# Patient Record
Sex: Male | Born: 1950 | Race: Black or African American | Hispanic: No | Smoking: Former smoker
Health system: Southern US, Community
[De-identification: ages and names within clinical notes are randomized; demographics above are authoritative.]

## PROBLEM LIST (undated history)

## (undated) DIAGNOSIS — IMO0002 Reserved for concepts with insufficient information to code with codable children: Secondary | ICD-10-CM

## (undated) DIAGNOSIS — F1020 Alcohol dependence, uncomplicated: Secondary | ICD-10-CM

## (undated) DIAGNOSIS — K56609 Unspecified intestinal obstruction, unspecified as to partial versus complete obstruction: Secondary | ICD-10-CM

## (undated) DIAGNOSIS — K219 Gastro-esophageal reflux disease without esophagitis: Secondary | ICD-10-CM

## (undated) DIAGNOSIS — I10 Essential (primary) hypertension: Secondary | ICD-10-CM

## (undated) DIAGNOSIS — I5022 Chronic systolic (congestive) heart failure: Secondary | ICD-10-CM

## (undated) DIAGNOSIS — R229 Localized swelling, mass and lump, unspecified: Secondary | ICD-10-CM

## (undated) HISTORY — DX: Gastro-esophageal reflux disease without esophagitis: K21.9

## (undated) HISTORY — DX: Chronic systolic (congestive) heart failure: I50.22

## (undated) HISTORY — DX: Unspecified intestinal obstruction, unspecified as to partial versus complete obstruction: K56.609

## (undated) HISTORY — DX: Reserved for concepts with insufficient information to code with codable children: IMO0002

## (undated) HISTORY — DX: Essential (primary) hypertension: I10

## (undated) HISTORY — DX: Localized swelling, mass and lump, unspecified: R22.9

## (undated) HISTORY — DX: Alcohol dependence, uncomplicated: F10.20

## (undated) HISTORY — PX: KNEE SURGERY: SHX244

---

## 1998-02-10 ENCOUNTER — Emergency Department (HOSPITAL_COMMUNITY): Admission: EM | Admit: 1998-02-10 | Discharge: 1998-02-10 | Payer: Self-pay | Admitting: Emergency Medicine

## 1998-04-19 ENCOUNTER — Emergency Department (HOSPITAL_COMMUNITY): Admission: EM | Admit: 1998-04-19 | Discharge: 1998-04-19 | Payer: Self-pay | Admitting: Emergency Medicine

## 1998-05-09 ENCOUNTER — Inpatient Hospital Stay (HOSPITAL_COMMUNITY): Admission: EM | Admit: 1998-05-09 | Discharge: 1998-05-12 | Payer: Self-pay | Admitting: Emergency Medicine

## 1999-01-17 ENCOUNTER — Emergency Department (HOSPITAL_COMMUNITY): Admission: EM | Admit: 1999-01-17 | Discharge: 1999-01-17 | Payer: Self-pay | Admitting: Internal Medicine

## 1999-05-25 ENCOUNTER — Emergency Department (HOSPITAL_COMMUNITY): Admission: EM | Admit: 1999-05-25 | Discharge: 1999-05-25 | Payer: Self-pay | Admitting: Emergency Medicine

## 1999-06-02 ENCOUNTER — Encounter: Payer: Self-pay | Admitting: Family Medicine

## 1999-06-02 ENCOUNTER — Ambulatory Visit (HOSPITAL_COMMUNITY): Admission: RE | Admit: 1999-06-02 | Discharge: 1999-06-02 | Payer: Self-pay | Admitting: Family Medicine

## 1999-11-17 ENCOUNTER — Encounter: Payer: Self-pay | Admitting: Orthopedic Surgery

## 1999-11-21 ENCOUNTER — Ambulatory Visit (HOSPITAL_COMMUNITY): Admission: RE | Admit: 1999-11-21 | Discharge: 1999-11-21 | Payer: Self-pay | Admitting: Orthopedic Surgery

## 1999-12-11 ENCOUNTER — Encounter: Admission: RE | Admit: 1999-12-11 | Discharge: 2000-01-22 | Payer: Self-pay | Admitting: Orthopedic Surgery

## 2000-10-01 HISTORY — PX: OTHER SURGICAL HISTORY: SHX169

## 2001-03-18 ENCOUNTER — Emergency Department (HOSPITAL_COMMUNITY): Admission: EM | Admit: 2001-03-18 | Discharge: 2001-03-18 | Payer: Self-pay | Admitting: Emergency Medicine

## 2001-07-07 ENCOUNTER — Emergency Department (HOSPITAL_COMMUNITY): Admission: EM | Admit: 2001-07-07 | Discharge: 2001-07-07 | Payer: Self-pay | Admitting: Emergency Medicine

## 2001-07-16 ENCOUNTER — Encounter: Admission: RE | Admit: 2001-07-16 | Discharge: 2001-07-16 | Payer: Self-pay

## 2001-10-31 ENCOUNTER — Encounter: Admission: RE | Admit: 2001-10-31 | Discharge: 2001-11-25 | Payer: Self-pay | Admitting: Orthopedic Surgery

## 2002-01-16 ENCOUNTER — Emergency Department (HOSPITAL_COMMUNITY): Admission: EM | Admit: 2002-01-16 | Discharge: 2002-01-16 | Payer: Self-pay | Admitting: *Deleted

## 2002-08-31 ENCOUNTER — Emergency Department (HOSPITAL_COMMUNITY): Admission: EM | Admit: 2002-08-31 | Discharge: 2002-08-31 | Payer: Self-pay | Admitting: Podiatry

## 2004-06-23 ENCOUNTER — Encounter: Admission: RE | Admit: 2004-06-23 | Discharge: 2004-06-23 | Payer: Self-pay | Admitting: Occupational Medicine

## 2005-10-01 DIAGNOSIS — F1020 Alcohol dependence, uncomplicated: Secondary | ICD-10-CM

## 2005-10-01 HISTORY — DX: Alcohol dependence, uncomplicated: F10.20

## 2006-05-24 ENCOUNTER — Ambulatory Visit: Payer: Self-pay | Admitting: Family Medicine

## 2006-05-31 ENCOUNTER — Ambulatory Visit: Payer: Self-pay

## 2006-06-12 ENCOUNTER — Ambulatory Visit: Payer: Self-pay | Admitting: Cardiovascular Disease

## 2006-06-12 ENCOUNTER — Encounter: Payer: Self-pay | Admitting: Cardiovascular Disease

## 2006-06-12 ENCOUNTER — Ambulatory Visit (HOSPITAL_COMMUNITY): Admission: RE | Admit: 2006-06-12 | Discharge: 2006-06-12 | Payer: Self-pay | Admitting: Family Medicine

## 2006-11-28 DIAGNOSIS — R079 Chest pain, unspecified: Secondary | ICD-10-CM | POA: Insufficient documentation

## 2006-11-28 DIAGNOSIS — F1010 Alcohol abuse, uncomplicated: Secondary | ICD-10-CM | POA: Insufficient documentation

## 2006-11-28 DIAGNOSIS — R42 Dizziness and giddiness: Secondary | ICD-10-CM | POA: Insufficient documentation

## 2006-11-28 DIAGNOSIS — K625 Hemorrhage of anus and rectum: Secondary | ICD-10-CM | POA: Insufficient documentation

## 2006-11-28 DIAGNOSIS — I10 Essential (primary) hypertension: Secondary | ICD-10-CM | POA: Insufficient documentation

## 2007-05-28 ENCOUNTER — Telehealth (INDEPENDENT_AMBULATORY_CARE_PROVIDER_SITE_OTHER): Payer: Self-pay | Admitting: Family Medicine

## 2007-07-17 ENCOUNTER — Emergency Department (HOSPITAL_COMMUNITY): Admission: EM | Admit: 2007-07-17 | Discharge: 2007-07-17 | Payer: Self-pay | Admitting: Emergency Medicine

## 2007-12-06 ENCOUNTER — Emergency Department (HOSPITAL_COMMUNITY): Admission: EM | Admit: 2007-12-06 | Discharge: 2007-12-07 | Payer: Self-pay | Admitting: Emergency Medicine

## 2008-01-02 ENCOUNTER — Ambulatory Visit (HOSPITAL_COMMUNITY): Admission: RE | Admit: 2008-01-02 | Discharge: 2008-01-02 | Payer: Self-pay | Admitting: Family Medicine

## 2008-01-02 ENCOUNTER — Ambulatory Visit: Payer: Self-pay | Admitting: Family Medicine

## 2008-01-02 DIAGNOSIS — F17200 Nicotine dependence, unspecified, uncomplicated: Secondary | ICD-10-CM | POA: Insufficient documentation

## 2008-01-06 ENCOUNTER — Encounter (INDEPENDENT_AMBULATORY_CARE_PROVIDER_SITE_OTHER): Payer: Self-pay | Admitting: Family Medicine

## 2008-01-08 ENCOUNTER — Encounter (INDEPENDENT_AMBULATORY_CARE_PROVIDER_SITE_OTHER): Payer: Self-pay | Admitting: Family Medicine

## 2008-01-08 ENCOUNTER — Ambulatory Visit: Payer: Self-pay | Admitting: Family Medicine

## 2008-01-12 LAB — CONVERTED CEMR LAB
BUN: 23 mg/dL (ref 6–23)
CO2: 26 meq/L (ref 19–32)
Calcium: 9.2 mg/dL (ref 8.4–10.5)
Chloride: 103 meq/L (ref 96–112)
Cholesterol: 180 mg/dL (ref 0–200)
Creatinine, Ser: 1.09 mg/dL (ref 0.40–1.50)
HDL: 78 mg/dL (ref >39)
Total CHOL/HDL Ratio: 2.3
Triglycerides: 36 mg/dL (ref <150)

## 2008-01-23 ENCOUNTER — Ambulatory Visit: Payer: Self-pay | Admitting: Family Medicine

## 2008-02-27 ENCOUNTER — Encounter (INDEPENDENT_AMBULATORY_CARE_PROVIDER_SITE_OTHER): Payer: Self-pay | Admitting: Family Medicine

## 2008-03-18 ENCOUNTER — Ambulatory Visit: Payer: Self-pay | Admitting: Family Medicine

## 2008-03-18 ENCOUNTER — Encounter (INDEPENDENT_AMBULATORY_CARE_PROVIDER_SITE_OTHER): Payer: Self-pay | Admitting: Family Medicine

## 2008-03-18 DIAGNOSIS — B350 Tinea barbae and tinea capitis: Secondary | ICD-10-CM | POA: Insufficient documentation

## 2008-04-08 ENCOUNTER — Telehealth: Payer: Self-pay | Admitting: *Deleted

## 2008-04-09 ENCOUNTER — Ambulatory Visit: Payer: Self-pay | Admitting: Family Medicine

## 2009-03-02 ENCOUNTER — Emergency Department (HOSPITAL_COMMUNITY): Admission: EM | Admit: 2009-03-02 | Discharge: 2009-03-02 | Payer: Self-pay | Admitting: Emergency Medicine

## 2010-03-07 ENCOUNTER — Emergency Department (HOSPITAL_COMMUNITY): Admission: EM | Admit: 2010-03-07 | Discharge: 2010-03-07 | Payer: Self-pay | Admitting: Emergency Medicine

## 2010-09-05 ENCOUNTER — Encounter: Payer: Self-pay | Admitting: Family Medicine

## 2010-11-02 NOTE — Miscellaneous (Signed)
  Clinical Lists Changes  Problems: Removed problem of SPECIAL SCREENING FOR MALIGNANT NEOPLASMS COLON (ICD-V76.51) Removed problem of SEBORRHEIC DERMATITIS (ICD-690.10) Removed problem of GASTROENTERITIS WITHOUT DEHYDRATION (ICD-558.9)

## 2010-11-07 ENCOUNTER — Encounter: Payer: Self-pay | Admitting: *Deleted

## 2010-12-29 ENCOUNTER — Inpatient Hospital Stay (HOSPITAL_COMMUNITY)
Admission: EM | Admit: 2010-12-29 | Discharge: 2011-01-06 | DRG: 571 | Disposition: A | Discharge status: Home / Self Care | Payer: Self-pay | Attending: Internal Medicine | Admitting: Internal Medicine

## 2010-12-29 ENCOUNTER — Inpatient Hospital Stay (INDEPENDENT_AMBULATORY_CARE_PROVIDER_SITE_OTHER)
Admission: RE | Admit: 2010-12-29 | Discharge: 2010-12-29 | Disposition: A | Discharge status: Home / Self Care | Payer: Self-pay | Source: Ambulatory Visit | Attending: Family Medicine | Admitting: Family Medicine

## 2010-12-29 ENCOUNTER — Emergency Department (HOSPITAL_COMMUNITY): Payer: Self-pay

## 2010-12-29 DIAGNOSIS — K560 Paralytic ileus: Secondary | ICD-10-CM | POA: Diagnosis not present

## 2010-12-29 DIAGNOSIS — K5289 Other specified noninfective gastroenteritis and colitis: Secondary | ICD-10-CM | POA: Diagnosis not present

## 2010-12-29 DIAGNOSIS — A4902 Methicillin resistant Staphylococcus aureus infection, unspecified site: Secondary | ICD-10-CM | POA: Diagnosis present

## 2010-12-29 DIAGNOSIS — L02419 Cutaneous abscess of limb, unspecified: Principal | ICD-10-CM | POA: Diagnosis present

## 2010-12-29 DIAGNOSIS — K449 Diaphragmatic hernia without obstruction or gangrene: Secondary | ICD-10-CM | POA: Diagnosis present

## 2010-12-29 DIAGNOSIS — F17200 Nicotine dependence, unspecified, uncomplicated: Secondary | ICD-10-CM | POA: Diagnosis present

## 2010-12-29 DIAGNOSIS — K219 Gastro-esophageal reflux disease without esophagitis: Secondary | ICD-10-CM | POA: Diagnosis present

## 2010-12-29 DIAGNOSIS — I10 Essential (primary) hypertension: Secondary | ICD-10-CM | POA: Diagnosis present

## 2010-12-29 DIAGNOSIS — E871 Hypo-osmolality and hyponatremia: Secondary | ICD-10-CM | POA: Diagnosis present

## 2010-12-29 LAB — COMPREHENSIVE METABOLIC PANEL
AST: 28 U/L (ref 0–37)
Alkaline Phosphatase: 41 U/L (ref 39–117)
CO2: 27 mEq/L (ref 19–32)
Chloride: 98 mEq/L (ref 96–112)
Creatinine, Ser: 1 mg/dL (ref 0.4–1.5)
GFR calc Af Amer: >60 mL/min (ref >60)
GFR calc non Af Amer: >60 mL/min (ref >60)
Potassium: 4 mEq/L (ref 3.5–5.1)
Total Bilirubin: 0.6 mg/dL (ref 0.3–1.2)

## 2010-12-29 LAB — DIFFERENTIAL
Basophils Relative: 0 % (ref 0–1)
Lymphs Abs: 0.9 10*3/uL (ref 0.7–4.0)
Monocytes Relative: 10 % (ref 3–12)
Neutro Abs: 8.9 10*3/uL — ABNORMAL HIGH (ref 1.7–7.7)
Neutrophils Relative %: 81 % — ABNORMAL HIGH (ref 43–77)

## 2010-12-29 LAB — CBC
Hemoglobin: 14 g/dL (ref 13.0–17.0)
MCH: 33.7 pg (ref 26.0–34.0)
MCV: 96.6 fL (ref 78.0–100.0)
RBC: 4.15 MIL/uL — ABNORMAL LOW (ref 4.22–5.81)

## 2010-12-30 ENCOUNTER — Inpatient Hospital Stay (HOSPITAL_COMMUNITY): Payer: Self-pay

## 2010-12-30 LAB — COMPREHENSIVE METABOLIC PANEL
ALT: 19 U/L (ref 0–53)
Alkaline Phosphatase: 37 U/L — ABNORMAL LOW (ref 39–117)
BUN: 15 mg/dL (ref 6–23)
CO2: 27 mEq/L (ref 19–32)
Chloride: 102 mEq/L (ref 96–112)
GFR calc non Af Amer: >60 mL/min (ref >60)
Glucose, Bld: 133 mg/dL — ABNORMAL HIGH (ref 70–99)
Potassium: 3.8 mEq/L (ref 3.5–5.1)
Sodium: 135 mEq/L (ref 135–145)
Total Bilirubin: 0.8 mg/dL (ref 0.3–1.2)

## 2010-12-30 LAB — CBC
HCT: 35.2 % — ABNORMAL LOW (ref 39.0–52.0)
Hemoglobin: 12 g/dL — ABNORMAL LOW (ref 13.0–17.0)
MCV: 96.2 fL (ref 78.0–100.0)
RBC: 3.66 MIL/uL — ABNORMAL LOW (ref 4.22–5.81)
RDW: 12.2 % (ref 11.5–15.5)
WBC: 10.4 10*3/uL (ref 4.0–10.5)

## 2010-12-31 ENCOUNTER — Inpatient Hospital Stay (HOSPITAL_COMMUNITY): Payer: Self-pay

## 2010-12-31 LAB — CBC
Platelets: 238 10*3/uL (ref 150–400)
RBC: 3.58 MIL/uL — ABNORMAL LOW (ref 4.22–5.81)
RDW: 12.5 % (ref 11.5–15.5)
WBC: 11 10*3/uL — ABNORMAL HIGH (ref 4.0–10.5)

## 2010-12-31 LAB — BASIC METABOLIC PANEL
BUN: 12 mg/dL (ref 6–23)
Chloride: 101 mEq/L (ref 96–112)
GFR calc Af Amer: >60 mL/min (ref >60)
GFR calc non Af Amer: >60 mL/min (ref >60)
Potassium: 3.6 mEq/L (ref 3.5–5.1)

## 2011-01-01 ENCOUNTER — Inpatient Hospital Stay (HOSPITAL_COMMUNITY): Payer: Self-pay

## 2011-01-01 LAB — WOUND CULTURE

## 2011-01-03 ENCOUNTER — Inpatient Hospital Stay (HOSPITAL_COMMUNITY): Payer: Self-pay

## 2011-01-03 LAB — CBC
HCT: 33.8 % — ABNORMAL LOW (ref 39.0–52.0)
MCV: 96.6 fL (ref 78.0–100.0)
Platelets: 299 10*3/uL (ref 150–400)
RBC: 3.5 MIL/uL — ABNORMAL LOW (ref 4.22–5.81)
RDW: 12.4 % (ref 11.5–15.5)
WBC: 4.1 10*3/uL (ref 4.0–10.5)

## 2011-01-03 LAB — BASIC METABOLIC PANEL
BUN: 9 mg/dL (ref 6–23)
Chloride: 106 mEq/L (ref 96–112)
GFR calc non Af Amer: >60 mL/min (ref >60)
Potassium: 4 mEq/L (ref 3.5–5.1)
Sodium: 141 mEq/L (ref 135–145)

## 2011-01-04 LAB — HEPATIC FUNCTION PANEL
ALT: 38 U/L (ref 0–53)
AST: 30 U/L (ref 0–37)
Alkaline Phosphatase: 31 U/L — ABNORMAL LOW (ref 39–117)
Total Protein: 6.2 g/dL (ref 6.0–8.3)

## 2011-01-05 LAB — DIFFERENTIAL
Basophils Absolute: 0 K/uL (ref 0.0–0.1)
Basophils Relative: 1 % (ref 0–1)
Eosinophils Absolute: 0.2 K/uL (ref 0.0–0.7)
Eosinophils Relative: 5 % (ref 0–5)
Lymphocytes Relative: 32 % (ref 12–46)
Lymphs Abs: 1.5 K/uL (ref 0.7–4.0)
Monocytes Absolute: 0.4 K/uL (ref 0.1–1.0)
Monocytes Relative: 10 % (ref 3–12)
Neutro Abs: 2.4 K/uL (ref 1.7–7.7)
Neutrophils Relative %: 52 % (ref 43–77)

## 2011-01-05 LAB — BASIC METABOLIC PANEL WITH GFR
BUN: 10 mg/dL (ref 6–23)
CO2: 30 meq/L (ref 19–32)
Calcium: 8.8 mg/dL (ref 8.4–10.5)
Chloride: 103 meq/L (ref 96–112)
Creatinine, Ser: 0.97 mg/dL (ref 0.4–1.5)
GFR calc non Af Amer: >60 mL/min
Glucose, Bld: 86 mg/dL (ref 70–99)
Potassium: 4.5 meq/L (ref 3.5–5.1)
Sodium: 137 meq/L (ref 135–145)

## 2011-01-05 LAB — CBC
MCH: 31.8 pg (ref 26.0–34.0)
MCHC: 33.1 g/dL (ref 30.0–36.0)
Platelets: 358 10*3/uL (ref 150–400)

## 2011-01-08 NOTE — Progress Notes (Signed)
NAME:  [name], [name] NO.:  [id number]  MEDICAL RECORD NO.:  [id number]           PATIENT TYPE:  I  LOCATION:  5030                         FACILITY:  [facility]  PHYSICIAN:  Erick Blinks, MD     DATE OF BIRTH:  Mar 22, 1951                                PROGRESS NOTE   PRIMARY CARE PHYSICIAN: The patient does not have a primary care physician.  CURRENT PROBLEM LIST: 1. Cellulitis and abscess of the left lower extremity just below the     knee status post incision and drainage in the emergency room     positive for methicillin-resistant Staphylococcus aureus,     improving. 2. Small bowel obstruction versus small bowel ileus. 3. Gastroesophageal reflux disease.  DISCHARGE MEDICATIONS: These will be reconciled by the discharging physician.  ADMISSION HISTORY: This is a 60 year old African-American male with history of GERD, tobacco dependence, who presents to the emergency room with redness of the skin/tenderness of the skin below the knee.  The patient reports this has been going on for 2 weeks with subjective fever.  In the emergency room, the patient was found to have temperature of 103.3.  The patient was evaluated by the emergency physician and had incision and drainages below the left knee.  The patient reports that he did have traumatic injury in that area, which was likely the point of entry for the cellulitis/abscess.  The patient was subsequently admitted for further treatment.  For further details, please refer the history and physical dictated by Dr. Selena Batten on December 29, 2010.  HOSPITAL COURSE: 1. Cellulitis/abscess.  The patient underwent MRI of the left knee,     which showed marked anterior soft tissue swelling, could reflect     severe cellulitis or complex prepatellar bursitis.  No findings for     septic arthritis, intact ligamentous structures and no discrete     meniscal tears, small joint effusion at second Baker's cyst.  The     patient was  started empirically on vancomycin and Zosyn.  Once his     cultures were positive for Gram-positive cocci, his Zosyn was     discontinued.  He subsequently grew MRSA and was continued on     vancomycin.  Plan will be to switch to oral Bactrim once the     patient is reliably taking oral medication. 2. Abdominal pain.  The patient had an episode of abdominal pain and     nausea.  Abdominal x-ray was taken, which showed nonspecific gas-     filled dilated loops of bowel in the midabdomen.  Further     characterization by CT of abdomen showed abnormal small bowel     dilatation consistent with either small bowel obstruction or small     bowel ileus, bilateral pleural effusions.  The patient reports     having some periumbilical abdominal pain.  He also has some nausea,     although he does not have any vomiting.  At this time, we will put     him on clear liquid diet.  We will start him on a bowel regimen as  well as repeat an x-ray in the morning.  The patient is encouraged     to ambulate.  We will minimize his narcotics.  If his x-ray shows     improvement, then he can likely be discharged in the morning.  CONSULTATIONS: None.  PROCEDURE: Incision and drainage in the emergency room of left knee abscess.  DIAGNOSTIC IMAGING: 1. X-ray of the left knee shows prepatellar and infrapatellar soft     tissue swelling without acute bony findings. 2. MRI of the left knee shows marked anterior soft swelling, could     reflect severe cellulitis or complex prepatellar bursitis.  No     findings for septic arthritis, intact ligamentous structures and no     discrete meniscal tear, small joint effusion at second Baker's     cyst. 3. X-ray of the abdomen shows nonspecific gas-filled dilated loops of     bowel in the mid abdomen.  Consider CT for further evaluation to     exclude small bowel obstruction. 4. CT of abdomen and pelvis on January 02, 2011, shows abnormal small     bowel dilatation  consistent with either small bowel obstruction or     small bowel ileus.     Erick Blinks, MD     JM/MEDQ  D:  01/02/2011  T:  01/02/2011  Job:  [id number]  Electronically Signed by Durward Mallard Velena Keegan  on 01/08/2011 04:51:35 PM
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
## 2011-01-11 NOTE — H&P (Signed)
NAME:  [name], [name] NO.:  [id number]  MEDICAL RECORD NO.:  [id number]          PATIENT TYPE:  INP  LOCATION:                               FACILITY:  [facility]  PHYSICIAN:  Massie Maroon, MD        DATE OF BIRTH:  07/24/1951  DATE OF ADMISSION: DATE OF DISCHARGE:                             HISTORY & PHYSICAL   CHIEF COMPLAINT:  "My skin is hot and tender to the touch and I've got a fever."  HISTORY OF PRESENT ILLNESS:  A 60 year old male with a history of hypertension, GERD/hiatal hernia, tobacco dependence until recently. Apparently, presents with complaints of redness of the skin/tenderness of the skin below the knee.  The patient states that this has been going on for 2 weeks and he has had a subjective fever as well.  The patient came to the ED for evaluation.  His temperature was 103.3.  The patient was evaluated by the emergency physician and he had an incision and drainage just below the left knee.  There is a quarter-sized opening that is packed at this point in time.  It looks like abrasion might have been point of entry for cellulitis as well as abscess.  The ER department said that there was pus that drained out.  ER physician said that this was sent for culture.  We hope that this is the case.  The patient will be admitted for cellulitis.  The patient denies any knee pain through either active or passive range of motion.  PAST MEDICAL HISTORY: 1. Hypertension. 2. Hiatal hernia/GERD.  PAST SURGICAL HISTORY:  Left knee arthroscopy.  SOCIAL HISTORY:  The patient quit smoking about 2 weeks ago, smoked one- pack per day x40 years.  He quit alcohol 2 weeks ago as well.  He lives with his girlfriend.  FAMILY HISTORY:  His mother had cancer as well as diabetes and his grandmother had diabetes.  ALLERGIES:  No known drug allergies.  MEDICATIONS:  None.  REVIEW OF SYSTEMS:  Negative for all 10 organ systems, except for pertinent positives as stated  above.  PHYSICAL EXAM:  VITAL SIGNS:  Temperature 101.4, pulse 94, blood pressure 124/67, pulse ox 100% on room air. HEENT:  Anicteric, EOMI, no nystagmus, pupils 1.5 mm, symmetric, direct consensual and near reflex is intact.  Mucous membranes moist. NECK:  No JVD.  No bruit.  No thyromegaly.  No adenopathy. HEART:  Regular rate and rhythm.  S1-S2.  No murmurs, gallops, or rubs. LUNGS:  Clear to auscultation bilaterally. ABDOMEN:  Soft, nontender, nondistended.  Positive bowel sounds. EXTREMITIES:  No cyanosis, clubbing, or edema. SKIN:  There is tenderness, erythema, and warmth over the tibial tuberosity/area below the knee as well as extending over the knee and up to the prepatellar bursa.  There is no tenderness on palpation of the prepatellar bursa at this time, however, feel slightly full.  The patient has good active and passive range of motion of his knee without complaints of knee pain.  He does have tenderness and pain where he has been incised and drained. SKIN:  No Janeway, no  Osler's, negative splinter hemorrhages. LABS:  Sodium 133, potassium 4.0, BUN 14, creatinine 1.0, AST 28, ALT 21.  WBC 10.9, hemoglobin 14.0, and platelet count 263.  Left knee x-ray shows prepatellar and infrapatellar soft tissue swelling without acute bony findings.  ASSESSMENT AND PLAN: 1. Cellulitis/abscess:  The patient will be treated empirically with     vancomycin and Zosyn.  We will await results of cultures that the     ER sent off.  Please obtain blood cultures x2 sets prior to     antibiotic use. 2. Hyponatremia likely secondary to pain is persistent, consider     checking serum OSM, TSH, cortisol, urine OSM, and urine sodium. 3. Hypertension:  The patient's blood pressure appears to be within     normal limits. 4. Fever secondary to cellulitis/abscess, Tylenol as needed, DVT     prophylaxis, Lovenox.     Massie Maroon, MD     JYK/MEDQ  D:  12/29/2010  T:  12/30/2010  Job:   [id number]  Electronically Signed by Pearson Grippe MD on 01/11/2011 02:06:44 AM

## 2011-01-16 NOTE — Discharge Summary (Signed)
NAME:  [name], [name]               ACCOUNT NO.:  [id number]  MEDICAL RECORD NO.:  [id number]           PATIENT TYPE:  I  LOCATION:  5030                         FACILITY:  [facility]  PHYSICIAN:  Christopher Scott, MD     DATE OF BIRTH:  [birth year]  DATE OF ADMISSION:  12/29/2010 DATE OF DISCHARGE:  01/06/2011                              DISCHARGE SUMMARY   PRIMARY CARE PHYSICIAN:  The patient does not have a primary care MD.  DISCHARGE DIAGNOSES: 1. Methicillin-resistant Staphylococcus aureus abscess of the left leg     status post incision and drainage. 2. Tobacco abuse. 3. Anemia. 4. Localized small bowel ileus, question secondary to enteritis.     Clinically improved.  DISCHARGE MEDICATIONS: 1. Tylenol 325-650 mg p.o. q.4 hourly p.r.n. for pain or fever. 2. Bactrim DS 1 tablet p.o. b.i.d. to complete 1-week course of this. 3. Senna 2 tablets p.o. at bedtime p.r.n. for constipation.  Procedures: Incision & Drainage of left leg abscess was done by ED MD in the ED.  IMAGING: 1. Abdominal x-rays on January 03, 2011, impression:  Abnormal dilated     small bowel with several air-fluid levels, favoring ileus over     obstruction.  Given the small right pleural effusion, consider     checking for pancreatitis as a potential cause for central bowel     ileus. 2. CT of the abdomen and pelvis without contrast, impression:     a.     Abnormal small bowel dilatation consistent with either small-      bowel obstruction or small bowel ileus.     b.     Bilateral pleural effusions, anasarca, and ascites.      Findings consistent with fluid overload state. 3. Abdominal x-ray on January 01, 2011, impression:  Nonspecific gas-     filled dilated loops of bowel in the mid abdomen. 4. MRI of the left knee without contrast, impression:     a.     Marked anterior soft tissue swelling could reflect severe      cellulitis or complex prepatellar bursitis.     b.     No findings for septic arthritis.  c.     Intact ligamentous structures and no discrete meniscal tear.     d.     Small joint effusion.     e.     Administrator, [employer] cyst. 5. X-ray of the left knee on December 29, 2010, impression:  Prepatellar     and infrapatellar soft tissue swelling without acute bony findings.  LABORATORY DATA:  CBC:  Hemoglobin 10.8, hematocrit 33, white blood cell 4.5, platelets 358 with MCV of 96.  Basic metabolic panel within normal limits.  BUN 10 and creatinine 0.97.  Lipase 16.  LFTs only significant for alkaline phosphatase of 31 and albumin of 2.5.  Wound cultures showed abundant methicillin-resistant Staphylococcus aureus which was sensitive to clindamycin, erythromycin, gentamicin, Levaquin, rifampin, Bactrim, vancomycin, and tetracycline but was resistant to penicillin and oxacillin.   CONSULTATIONS:  Lennie Muckle, MD, General Surgery.  DIET:  Heart-healthy diet.  ACTIVITIES:  Increase activity slowly.  WOUND CARE INSTRUCTIONS:  Wet to dry dressing to the left leg wound twice daily.  COMPLAINTS:  Minimal intermittent pain of the left leg, otherwise no complaints.  He has been tolerating his diet.  No abdominal distention, no nausea or vomiting.  He has been having regular BMs.  PHYSICAL EXAMINATION:  GENERAL:  The patient is in no obvious distress. VITAL SIGNS:  Temperature 97.7 degrees Fahrenheit, pulse 56 per minute, respirations 18 per minute, blood pressure 104/53 mmHg, and saturating at 97% on room air. RESPIRATORY SYSTEM:  Clear. CARDIOVASCULAR SYSTEM:  First and second heart sounds heard and regular. No JVD. ABDOMEN:  Nondistended, nontender, soft, and bowel sounds present. CENTRAL NERVOUS SYSTEM:  The patient is awake, alert, and oriented x3 with no focal neurological deficits. SKIN:  The patient has an area of incision and drainage wound in the inferolateral to the left leg which is clean.  No acute signs.  HOSPITAL COURSE:  [name] is a 60 year old African American  male patient with history of tobacco abuse, gastroesophageal reflux disease who presented with redness, pain of the left leg just below the knee which had been ongoing for 2 weeks with associated fever.  In the emergency room, he was evaluated by the emergency room physician who performed incision and drainage of the site below the left knee for a presumed abscess.  The patient did report traumatic injury in that area which was probably the point of entry of the infection.  The [facility] were requested to admit him for further management.  1. MRSA abscess of the left leg.  The patient underwent MRI of the     left knee.  There were no clinical or radiological findings of     septic arthritis.  The patient was empirically started on IV     vancomycin and Zosyn.  With this, he made steady improvement.  He     was eventually switched to oral Bactrim after sensitivities were     back 3 days ago.  Two days back on reviewing the wound, it was     noted that he again had a boggy area underneath the wound and was     extruding frank pus.  The general surgeons were consulted who     kindly saw him and debrided the wound at the bedside.  They     continued to see him and the patient received pulse large through     physical therapy.  Today, the surgeons have cleared him for     discharge for b.i.d. wound dressing, antibiotics, and to be seen at     their offices as an outpatient.  The patient has been educated     regarding performing the dressing by himself.  Home health RN has     also been arranged.  Assistance for home oral antibiotics has also     been made. 2. Tobacco abuse.  Cessation counseling done. 3. Anemia, which is stable.  Recommend outpatient evaluation when he     sees his primary care physician. 4. Possible ileus versus gastroenteritis.  The patient had an episode     of abdominal pain and nausea.  This was evaluated with CT scan with     results as above.  There is a  small area of dilatation in the small     bowel area.  However, clinically he did well and has tolerated diet     and is passing stools with no further abdominal pain.  This     dictator's discussion with the radiologist suggested that this may     be enteritis with focal ileus and if clinically he is doing well,     there is no need to follow up by repeat imaging.  However, consider     outpatient evaluation with the colonoscopy and EGD given his     history of anemia and for screening.  DISPOSITION:  The patient is discharged home in stable condition.  FOLLOWUP RECOMMENDATIONS: 1. With Dr. Bertram Savin in 1 week from hospital discharge.  The     patient is to call for an appointment. 2. With [employer].  The case manager will call the     patient on Monday, January 08, 2011, with an appointment. 3. Wet-to-dry dressing b.i.d. to the wound.  Home health RN will     assist.    TIME TAKEN IN COORDINATING THIS DISCHARGE:  45 minutes.     Christopher Scott, MD     AH/MEDQ  D:  01/06/2011  T:  01/07/2011  Job:  [id number]  cc:   [facility] Lennie Muckle, MD  Electronically Signed by Christopher Scott MD on 01/16/2011 12:16:21 AM

## 2011-02-16 NOTE — Op Note (Signed)
[place]. [facility]  Patient:    [name], [name]                      MRN: [id number] Proc. Date: 11/21/99 Adm. Date:  [id number] Attending:  Wende Mott                           Operative Report  PREOPERATIVE DIAGNOSIS:  Internal derangement of the left knee.  POSTOPERATIVE DIAGNOSES: 1. Chondral defect, inner condylar notch and medial femoral condyle. 2. A partial tear of the lateral meniscus. 3. 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  ANESTHESIA:  General.  PROCEDURE: 1. Arthroscopic chondroplasty inner condylar notch of medial femoral condyle. 2. Complete synovectomy of medial and lateral compartment. 3. Excision of plica. 4. Partial resection of lateral meniscus. 5. Removal of loose bodies.  SURGEON:  Kennieth Rad, M.D.  DESCRIPTION OF PROCEDURE:  The patient was taken to the operating room after being given preoperative medications, and was given a general anesthesia and intubated. A 1/2 inch puncture wound was made along the anterior, medial, and lateral joint lines.  The inflow was through the medial suprapatellar pouch area.  Inspection of his joint revealed chronically-thickened synovium, both medial and lateral compartment with thickened hypertrophic plica.  A chondral defect in the inner condylar notch, as well as the medial femoral condyle.  Loose bodies in the lateral recess as well as the posterior lateral compartment.  There was fraying about the peripheral edge of the lateral meniscus with a small tear in the lateral meniscus. With the use of the synovial shaver, a complete synovectomy was done, and excision of plica.  A chondroplasty was then done with the use of the meniscal shaver. Copious and abundant irrigation and removal of the loose bodies from the pouch, as well as the lateral compartment was done.  With the use of basket forceps, a partial resection of the  lateral meniscus was done, and the edges were smoothed. The anterior cruciate was intact.  The PCL was intact.  Subchondral malacic change of the lateral tibial plateau surface.  Further inspection did not reveal any other loose fragments.  The wound closure was then done with #4-0 nylon, and 10 cc of  0.25% Marcaine were placed into the joint.  A compressive dressing was applied nd the knee immobilizer applied.  The patient tolerated the procedure quite well and went to the recovery room in  stable and satisfactory condition.  DISPOSITION:  The patient is being discharged home with the use of crutches, partial weightbearing, ice packs to the left knee, elevation, with Percocet #10, one q.4h. p.r.n. pain.  To continue his Vioxx.  To return to the office in one week.  The patient is being discharged in stable and satisfactory condition. DD:  11/21/99 TD:  11/21/99 Job: [id number]
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
## 2011-03-08 ENCOUNTER — Emergency Department (HOSPITAL_COMMUNITY)
Admission: EM | Admit: 2011-03-08 | Discharge: 2011-03-08 | Disposition: A | Discharge status: Home / Self Care | Payer: Medicaid Other | Attending: Emergency Medicine | Admitting: Emergency Medicine

## 2011-03-08 DIAGNOSIS — S5010XA Contusion of unspecified forearm, initial encounter: Secondary | ICD-10-CM | POA: Insufficient documentation

## 2011-03-08 DIAGNOSIS — S0120XA Unspecified open wound of nose, initial encounter: Secondary | ICD-10-CM | POA: Insufficient documentation

## 2011-03-08 DIAGNOSIS — S01409A Unspecified open wound of unspecified cheek and temporomandibular area, initial encounter: Secondary | ICD-10-CM | POA: Insufficient documentation

## 2011-03-08 DIAGNOSIS — M79609 Pain in unspecified limb: Secondary | ICD-10-CM | POA: Insufficient documentation

## 2011-03-08 DIAGNOSIS — S51809A Unspecified open wound of unspecified forearm, initial encounter: Secondary | ICD-10-CM | POA: Insufficient documentation

## 2011-03-23 ENCOUNTER — Other Ambulatory Visit (HOSPITAL_COMMUNITY): Payer: Self-pay | Admitting: Emergency Medicine

## 2011-03-23 ENCOUNTER — Ambulatory Visit (INDEPENDENT_AMBULATORY_CARE_PROVIDER_SITE_OTHER): Payer: Self-pay

## 2011-03-23 ENCOUNTER — Inpatient Hospital Stay (INDEPENDENT_AMBULATORY_CARE_PROVIDER_SITE_OTHER)
Admission: RE | Admit: 2011-03-23 | Discharge: 2011-03-23 | Disposition: A | Discharge status: Home / Self Care | Payer: Self-pay | Source: Ambulatory Visit | Attending: Family Medicine | Admitting: Family Medicine

## 2011-03-23 DIAGNOSIS — L089 Local infection of the skin and subcutaneous tissue, unspecified: Secondary | ICD-10-CM

## 2011-03-23 DIAGNOSIS — T148XXA Other injury of unspecified body region, initial encounter: Secondary | ICD-10-CM

## 2011-03-23 DIAGNOSIS — IMO0002 Reserved for concepts with insufficient information to code with codable children: Secondary | ICD-10-CM

## 2011-03-23 DIAGNOSIS — X58XXXA Exposure to other specified factors, initial encounter: Secondary | ICD-10-CM

## 2011-03-28 ENCOUNTER — Inpatient Hospital Stay (INDEPENDENT_AMBULATORY_CARE_PROVIDER_SITE_OTHER)
Admission: RE | Admit: 2011-03-28 | Discharge: 2011-03-28 | Disposition: A | Discharge status: Home / Self Care | Payer: Self-pay | Source: Ambulatory Visit | Attending: Emergency Medicine | Admitting: Emergency Medicine

## 2011-03-28 DIAGNOSIS — S61209A Unspecified open wound of unspecified finger without damage to nail, initial encounter: Secondary | ICD-10-CM

## 2011-05-08 ENCOUNTER — Inpatient Hospital Stay (INDEPENDENT_AMBULATORY_CARE_PROVIDER_SITE_OTHER)
Admission: RE | Admit: 2011-05-08 | Discharge: 2011-05-08 | Disposition: A | Discharge status: Home / Self Care | Payer: Self-pay | Source: Ambulatory Visit | Attending: Emergency Medicine | Admitting: Emergency Medicine

## 2011-05-08 DIAGNOSIS — R197 Diarrhea, unspecified: Secondary | ICD-10-CM

## 2011-05-08 LAB — POCT URINALYSIS DIP (DEVICE)
Bilirubin Urine: NEGATIVE
Glucose, UA: NEGATIVE mg/dL
Leukocytes, UA: NEGATIVE
Nitrite: NEGATIVE

## 2011-05-08 LAB — POCT I-STAT, CHEM 8
HCT: 46 % (ref 39.0–52.0)
Hemoglobin: 15.6 g/dL (ref 13.0–17.0)
Sodium: 137 mEq/L (ref 135–145)
TCO2: 28 mmol/L (ref 0–100)

## 2011-06-13 ENCOUNTER — Other Ambulatory Visit: Payer: Self-pay | Admitting: Family Medicine

## 2011-06-13 ENCOUNTER — Ambulatory Visit (INDEPENDENT_AMBULATORY_CARE_PROVIDER_SITE_OTHER): Payer: Medicaid Other | Admitting: Family Medicine

## 2011-06-13 ENCOUNTER — Encounter: Payer: Self-pay | Admitting: Family Medicine

## 2011-06-13 VITALS — BP 138/80 | HR 54 | Temp 97.9°F | Ht 73.0 in | Wt 165.8 lb

## 2011-06-13 DIAGNOSIS — R197 Diarrhea, unspecified: Secondary | ICD-10-CM

## 2011-06-13 DIAGNOSIS — Z23 Encounter for immunization: Secondary | ICD-10-CM

## 2011-06-13 DIAGNOSIS — L280 Lichen simplex chronicus: Secondary | ICD-10-CM | POA: Insufficient documentation

## 2011-06-13 DIAGNOSIS — L989 Disorder of the skin and subcutaneous tissue, unspecified: Secondary | ICD-10-CM

## 2011-06-13 NOTE — Progress Notes (Signed)
Subjective: Hair on the back of head is growing in as opposed to out.  Has 3-4 loose stools per day.  This has been going on for 1 month.  Loose stools but no blood.  Does have some episodic epigastric pain.  Eats relatively irregular diet (whatever he can get his hands on).  Is now eating a bit better.  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  Drinks 3-4 40 oz beers per day.  Occasional smoking.  (1/4 ppd).  Has used cocaine but not in the past 4 months.  Now lives with a friend but has been previously homeless.  Objective:  Filed Vitals:   06/13/11 0945  BP: 138/80  Pulse: 54  Temp: 97.9 F (36.6 C)   Gen: NAD HEENT: Poor dentition, hard to understand. 3x4cm section of posterior scalp is without hair, somewhat raised, and has scabbing on it.  Does not fluoresce under woods lamp CV: RRR Resp: CTABL Abd: Soft, mild tenderness in epigastrium.  Procedure Note: Pt consent obtained and timeout performed.  Scalp cleaned with alcohol and numbed with 1% lidocaine.  4mm punch biopsy performed and sample sent to lab in formalin.  Hemostasis achieved with pressure and a pressure bandage was placed.  Pt instructed to keep area clean for next several days.  Pt tolerated procedure well.  Assessment/Plan: Please also see individual problems in problem list for problem-specific plans.

## 2011-06-13 NOTE — Assessment & Plan Note (Signed)
4mm punch biopsy obtained today.  Will f/u in one week for results.

## 2011-06-13 NOTE — Assessment & Plan Note (Addendum)
Will obtain stool guiac, rec obtaining from alcohol, avoiding non-refrigerated food.  Pt to return if diarrhea returns.  (Has not had in about 2 weeks.)

## 2011-06-13 NOTE — Patient Instructions (Signed)
It was great to see you today! I want you to collect some stool to check for blood.  We will hold off on any cultures until you are actually having loose stools. I want you to come back in about a week so we can talk about the results of your biopsy

## 2011-06-25 LAB — CBC
MCHC: 34.6
RBC: 4.25
WBC: 3.7 — ABNORMAL LOW

## 2011-06-25 LAB — URINALYSIS, ROUTINE W REFLEX MICROSCOPIC
Bilirubin Urine: NEGATIVE
Nitrite: NEGATIVE
Specific Gravity, Urine: 1.008
Urobilinogen, UA: 1

## 2011-06-25 LAB — DIFFERENTIAL
Basophils Absolute: 0
Basophils Relative: 1
Monocytes Relative: 17 — ABNORMAL HIGH
Neutro Abs: 2.2
Neutrophils Relative %: 60

## 2011-06-25 LAB — POCT I-STAT CREATININE: Creatinine, Ser: 1.2

## 2011-06-25 LAB — I-STAT 8, (EC8 V) (CONVERTED LAB)
Bicarbonate: 26.1 — ABNORMAL HIGH
Chloride: 101
HCT: 44
Hemoglobin: 15
Operator id: 294511
TCO2: 28
pCO2, Ven: 48.2

## 2011-06-27 ENCOUNTER — Ambulatory Visit (INDEPENDENT_AMBULATORY_CARE_PROVIDER_SITE_OTHER): Payer: Medicaid Other | Admitting: Family Medicine

## 2011-06-27 ENCOUNTER — Encounter: Payer: Self-pay | Admitting: Family Medicine

## 2011-06-27 VITALS — BP 119/66 | HR 68 | Temp 97.4°F | Ht 73.0 in | Wt 170.0 lb

## 2011-06-27 DIAGNOSIS — L280 Lichen simplex chronicus: Secondary | ICD-10-CM

## 2011-06-27 MED ORDER — TRIAMCINOLONE ACETONIDE 0.5 % EX OINT: TOPICAL_OINTMENT | Freq: Two times a day (BID) | CUTANEOUS | Status: DC

## 2011-06-27 NOTE — Assessment & Plan Note (Signed)
Further review of the patient's history demonstrates that his scalp lesion has been present for many years, has only grown mildly larger recently. It was biopsied in 2009, it has not grown significantly since then. Accordingly we will try a one-month trial of a high potency steroid ointment and see the patient back in clinic. If this does not significantly improve his symptoms, we will plan on sending him to Gen. surgery for an excisional biopsy.

## 2011-06-27 NOTE — Patient Instructions (Signed)
I want you to put the ointment I am prescribing for you on two times per day for the next month. Come back to see me then.  If it is not looking a lot better, we will get you to see a Careers adviser.

## 2011-06-27 NOTE — Progress Notes (Signed)
Subjective: Lesion still present and bothers him.  No new symptoms.  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  Biopsy obtained two weeks ago demonstrated a few atypical squamous cells and large amount of keratin debri.  Objective:  Filed Vitals:   06/27/11 1054  BP: 119/66  Pulse: 68  Temp: 97.4 F (36.3 C)   Head: Lesion is essentially unchanged from previous exam.  Site of biopsy is well healed.  Lesion is mobile and 3x5cm  Assessment/Plan: Please also see individual problems in problem list for problem-specific plans.

## 2011-07-30 ENCOUNTER — Ambulatory Visit: Payer: Medicaid Other | Admitting: Family Medicine

## 2011-08-20 ENCOUNTER — Ambulatory Visit (INDEPENDENT_AMBULATORY_CARE_PROVIDER_SITE_OTHER): Payer: Medicaid Other | Admitting: Family Medicine

## 2011-08-20 ENCOUNTER — Encounter: Payer: Self-pay | Admitting: Family Medicine

## 2011-08-20 DIAGNOSIS — L280 Lichen simplex chronicus: Secondary | ICD-10-CM

## 2011-08-20 DIAGNOSIS — R1013 Epigastric pain: Secondary | ICD-10-CM

## 2011-08-20 MED ORDER — OMEPRAZOLE 20 MG PO CPDR: 20.0000 mg | DELAYED_RELEASE_CAPSULE | Freq: Every day | ORAL | Status: DC

## 2011-08-20 NOTE — Assessment & Plan Note (Signed)
Stable. As the patient and his caregiver are very concerned about this and has not improved with greater than 2 months of treatment, will refer to general surgery for evaluation for excisional biopsy.

## 2011-08-20 NOTE — Progress Notes (Signed)
Subjective: The patient presents today for followup on the lesion of his head. He reports that he has finished antibiotics along with continued application of steroid cream. The lesion continues to drain intermittently and bleeding intermittently. It has not gotten significantly larger. It is not associated with any additional systemic symptoms.  The patient also reports intermittent epigastric pain is not associated with eating. He has had this problem in the past, and was diagnosed with an alcoholic ulcer. He continues to drink alcohol, although he is trying to cut back. He is currently drinking 5-6 cans of beer per week. He is not taking any medication for this condition.  Objective:  Filed Vitals:   08/20/11 1437  BP: 137/85  Pulse: 77  Temp: 97.9 F (36.6 C)   Gen: No acute distress HEENT: 2 x 5 cm area of hypokinesia on the posterior aspect of the head. There is scaling, lichenification, but no erythema or drainage. CV: Regular rate and rhythm Resp: Clear to auscultation bilaterally Abd: Soft, nontender, nondistended Extremities: 2+ pulses, no edema  Assessment/Plan:  Please also see individual problems in problem list for problem-specific plans.

## 2011-08-20 NOTE — Patient Instructions (Signed)
It was good to see you today! We will get you over to the general surgeons to take a look at your head. I would like you to try taking omeprazole (also known as prilosec) every day for the next month.

## 2011-08-20 NOTE — Assessment & Plan Note (Signed)
Uncertain etiology as the patient is not the greatest historians. Will try a one-month trial of proton pump inhibitor and encourage decrease alcohol intake.

## 2011-08-21 ENCOUNTER — Telehealth: Payer: Self-pay | Admitting: *Deleted

## 2011-08-21 NOTE — Telephone Encounter (Signed)
Spoke with [name] and informed her of appointment set up at [facility] for 12/7 @ 9:15am patient to arrive to at 8:45am

## 2011-08-29 ENCOUNTER — Emergency Department (HOSPITAL_COMMUNITY)
Admission: EM | Admit: 2011-08-29 | Discharge: 2011-08-29 | Disposition: A | Discharge status: Home / Self Care | Payer: Medicaid Other | Attending: Emergency Medicine | Admitting: Emergency Medicine

## 2011-08-29 ENCOUNTER — Encounter (HOSPITAL_COMMUNITY): Payer: Self-pay | Admitting: *Deleted

## 2011-08-29 DIAGNOSIS — Z79899 Other long term (current) drug therapy: Secondary | ICD-10-CM | POA: Insufficient documentation

## 2011-08-29 DIAGNOSIS — H53149 Visual discomfort, unspecified: Secondary | ICD-10-CM | POA: Insufficient documentation

## 2011-08-29 DIAGNOSIS — H579 Unspecified disorder of eye and adnexa: Secondary | ICD-10-CM | POA: Insufficient documentation

## 2011-08-29 DIAGNOSIS — H109 Unspecified conjunctivitis: Secondary | ICD-10-CM | POA: Insufficient documentation

## 2011-08-29 DIAGNOSIS — H11419 Vascular abnormalities of conjunctiva, unspecified eye: Secondary | ICD-10-CM | POA: Insufficient documentation

## 2011-08-29 DIAGNOSIS — H5789 Other specified disorders of eye and adnexa: Secondary | ICD-10-CM | POA: Insufficient documentation

## 2011-08-29 DIAGNOSIS — H538 Other visual disturbances: Secondary | ICD-10-CM | POA: Insufficient documentation

## 2011-08-29 DIAGNOSIS — H5710 Ocular pain, unspecified eye: Secondary | ICD-10-CM | POA: Insufficient documentation

## 2011-08-29 DIAGNOSIS — S0510XA Contusion of eyeball and orbital tissues, unspecified eye, initial encounter: Secondary | ICD-10-CM | POA: Insufficient documentation

## 2011-08-29 MED ORDER — TETRACAINE HCL 0.5 % OP SOLN
2.0000 [drp] | Freq: Once | OPHTHALMIC | Status: AC
  Administered 2011-08-29: 2 [drp] via OPHTHALMIC
  Filled 2011-08-29: qty 2

## 2011-08-29 MED ORDER — ERYTHROMYCIN 5 MG/GM OP OINT: TOPICAL_OINTMENT | OPHTHALMIC | Status: DC

## 2011-08-29 MED ORDER — ERYTHROMYCIN 5 MG/GM OP OINT: TOPICAL_OINTMENT | OPHTHALMIC | Status: AC

## 2011-08-29 MED ORDER — FLUORESCEIN SODIUM 1 MG OP STRP
1.0000 | ORAL_STRIP | Freq: Once | OPHTHALMIC | Status: AC
  Administered 2011-08-29: 1 via OPHTHALMIC
  Filled 2011-08-29: qty 1
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
## 2011-09-04 ENCOUNTER — Encounter (INDEPENDENT_AMBULATORY_CARE_PROVIDER_SITE_OTHER): Payer: Self-pay | Admitting: General Surgery

## 2011-09-07 ENCOUNTER — Encounter (INDEPENDENT_AMBULATORY_CARE_PROVIDER_SITE_OTHER): Payer: Self-pay | Admitting: General Surgery

## 2011-09-07 ENCOUNTER — Ambulatory Visit (INDEPENDENT_AMBULATORY_CARE_PROVIDER_SITE_OTHER): Payer: Medicaid Other | Admitting: General Surgery

## 2011-09-07 VITALS — BP 121/79 | HR 80 | Temp 97.7°F | Resp 16 | Ht 74.0 in | Wt 172.4 lb

## 2011-09-07 DIAGNOSIS — R229 Localized swelling, mass and lump, unspecified: Secondary | ICD-10-CM

## 2011-09-07 DIAGNOSIS — R220 Localized swelling, mass and lump, head: Secondary | ICD-10-CM

## 2011-09-07 NOTE — Progress Notes (Signed)
Patient ID: [name], male   DOB: [birth year], 60 y.o.   MRN: [id number]  Chief Complaint  Patient presents with  . Mass    back of head    HPI [name] is a 60 y.o. male. This patient is referred for evaluation of a posterior scalp mass which he states has been present for several years. He is difficult to understand but it sounds like this has been followed for a while and his primary physician has even performed a biopsy in September which demonstrated atypical squamous proliferation and squamous cell carcinoma could not be completely excluded. He states that this is just frequently and causes occasional sharp discomfort. It also drains purulent material. HPI  Past Medical History  Diagnosis Date  . GERD (gastroesophageal reflux disease)   . Hypertension   . Mass     back of head    Past Surgical History  Procedure Date  . Knee surgery     left knee    No family history on file.  Social History History  Substance Use Topics  . Smoking status: Current Everyday Smoker -- 0.2 packs/day  . Smokeless tobacco: Not on file  . Alcohol Use: 16.8 oz/week    28 Cans of beer per week    No Known Allergies  Current Outpatient Prescriptions  Medication Sig Dispense Refill  . erythromycin ophthalmic ointment Place a 1/2 inch ribbon of ointment into the lower eyelid of the right eye qid for 7 days  3.5 g  0  . omeprazole (PRILOSEC) 20 MG capsule Take 1 capsule (20 mg total) by mouth daily.  30 capsule  1    Review of Systems Review of Systems All other review of systems negative or noncontributory except as stated in the HPI  Blood pressure 121/79, pulse 80, temperature 97.7 F (36.5 C), temperature source Temporal, resp. rate 16, height 6\' 2"  (1.88 m), weight 172 lb 6.4 oz (78.2 kg).  Physical Exam Physical Exam  Constitutional: He is oriented to person, place, and time. He appears well-developed and well-nourished. No distress.  HENT:  Mouth/Throat: No  oropharyngeal exudate.       He has a 4 cm x 3 cm area on the posterior scalp on the vertex which is elevated and appears to be scarred with few hair follicles and appearance of hidradenitis. The skin is elevated and he feels like there is some underlying fluctuance but there is no active drainage or cellulitis or significant tenderness.  Eyes: Conjunctivae are normal. Pupils are equal, round, and reactive to light. No scleral icterus.  Neck: No tracheal deviation present.  Cardiovascular: Normal rate.   Pulmonary/Chest: Effort normal. No stridor. No respiratory distress. He has no wheezes.  Abdominal: Soft. He exhibits no distension. There is no tenderness.  Neurological: He is alert and oriented to person, place, and time.  Skin: Skin is warm. No rash noted. He is not diaphoretic. No erythema. No pallor.       Scalp lesion as above   Psychiatric: He has a normal mood and affect. His behavior is normal. Judgment and thought content normal.    Data Reviewed   Assessment    Posterior scalp lesion versus infection This is likely just hidradenitis-type changes and some underlying folliculitis and infection however we cannot completely exclude squamous cell carcinoma given his prior biopsy and its chronicity. Recommended excisional biopsy of the central area and he likely has some fluctuant area below and infection and will need wound packing.  I have recommended that we perform this in the operating room and explained that he will likely have some prolonged wound healing and need for wound care. If this is malignancy he will likely need additional surgery for wide excision. He elected to proceed with removal when available.    Plan    We will plan for excisional biopsy in the operating room.       Lodema Pilot DAVID 09/07/2011, 9:15 AM

## 2011-09-21 ENCOUNTER — Other Ambulatory Visit: Payer: Self-pay

## 2011-09-21 DIAGNOSIS — L738 Other specified follicular disorders: Secondary | ICD-10-CM

## 2011-09-24 ENCOUNTER — Telehealth (INDEPENDENT_AMBULATORY_CARE_PROVIDER_SITE_OTHER): Payer: Self-pay | Admitting: General Surgery

## 2011-09-24 NOTE — Telephone Encounter (Signed)
[facility] CALLED TO CLARIFY STRENGTH OF VICODIN ORDER WRITTEN BY DR. LAYTON/ I REVIEWED THIS WITH DR. BLACKMAN AND HE SAID VICODIN 5/500MG . PHARMACY NOTIFIED/ [id number].Lanier Prude

## 2011-10-04 ENCOUNTER — Ambulatory Visit (INDEPENDENT_AMBULATORY_CARE_PROVIDER_SITE_OTHER): Payer: Medicaid Other | Admitting: General Surgery

## 2011-10-04 VITALS — BP 142/88 | HR 84 | Temp 97.4°F | Resp 16 | Ht 73.0 in | Wt 177.0 lb

## 2011-10-04 DIAGNOSIS — S0100XA Unspecified open wound of scalp, initial encounter: Secondary | ICD-10-CM

## 2011-10-04 NOTE — Progress Notes (Signed)
Subjective:     Patient ID: [name], male   DOB: 04-11-1951, 61 y.o.   MRN: [id number]  HPI This patient follows up status post excisional biopsy of scalp lesion. His pathology was benign and no evidence of malignancy. 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 He saw some discomfort in the area but has been doing daily wound care with wet to dry dressings.  Review of Systems     Objective:   Physical Exam No distress and nontoxic-appearing  The scalp lesion is feeling well without sign of infection. He still has an open wound which is granulating in nicely.     Assessment:     Status post excisional biopsy of scalp mass  He seems to be doing well with wound care and is doing this by himself. Rather impressed with his ability to care for this wound and the back of his scalp but it appears to be granulating in fine. This is a fairly large wound and will take several weeks to heal with the current wound care so I will place a referral to plastic surgery to see if there are any options for closure.    Plan:     He will see plastic surgery for evaluation and otherwise continue current wound care and he will follow up with me in 3 weeks to reevaluate the wound.

## 2012-03-30 ENCOUNTER — Emergency Department (HOSPITAL_COMMUNITY): Payer: Medicare Other

## 2012-03-30 ENCOUNTER — Emergency Department (HOSPITAL_COMMUNITY)
Admission: EM | Admit: 2012-03-30 | Discharge: 2012-03-30 | Disposition: A | Discharge status: Home / Self Care | Payer: Medicare Other | Attending: Emergency Medicine | Admitting: Emergency Medicine

## 2012-03-30 ENCOUNTER — Encounter (HOSPITAL_COMMUNITY): Payer: Self-pay | Admitting: *Deleted

## 2012-03-30 DIAGNOSIS — IMO0002 Reserved for concepts with insufficient information to code with codable children: Secondary | ICD-10-CM | POA: Insufficient documentation

## 2012-03-30 DIAGNOSIS — F17200 Nicotine dependence, unspecified, uncomplicated: Secondary | ICD-10-CM | POA: Insufficient documentation

## 2012-03-30 DIAGNOSIS — R1013 Epigastric pain: Secondary | ICD-10-CM | POA: Insufficient documentation

## 2012-03-30 DIAGNOSIS — L02419 Cutaneous abscess of limb, unspecified: Secondary | ICD-10-CM

## 2012-03-30 DIAGNOSIS — L03119 Cellulitis of unspecified part of limb: Secondary | ICD-10-CM

## 2012-03-30 DIAGNOSIS — I10 Essential (primary) hypertension: Secondary | ICD-10-CM | POA: Insufficient documentation

## 2012-03-30 DIAGNOSIS — K219 Gastro-esophageal reflux disease without esophagitis: Secondary | ICD-10-CM | POA: Insufficient documentation

## 2012-03-30 LAB — COMPREHENSIVE METABOLIC PANEL
ALT: 20 U/L (ref 0–53)
Albumin: 3.7 g/dL (ref 3.5–5.2)
Alkaline Phosphatase: 45 U/L (ref 39–117)
Calcium: 9.7 mg/dL (ref 8.4–10.5)
Potassium: 3.7 mEq/L (ref 3.5–5.1)
Sodium: 135 mEq/L (ref 135–145)
Total Protein: 7.8 g/dL (ref 6.0–8.3)

## 2012-03-30 LAB — CBC WITH DIFFERENTIAL/PLATELET
Basophils Relative: 0 % (ref 0–1)
Eosinophils Absolute: 0 10*3/uL (ref 0.0–0.7)
MCH: 34 pg (ref 26.0–34.0)
MCHC: 35.6 g/dL (ref 30.0–36.0)
Neutrophils Relative %: 75 % (ref 43–77)
Platelets: 245 10*3/uL (ref 150–400)
RDW: 12.7 % (ref 11.5–15.5)

## 2012-03-30 LAB — LIPASE, BLOOD: Lipase: 10 U/L — ABNORMAL LOW (ref 11–59)

## 2012-03-30 MED ORDER — DICYCLOMINE HCL 10 MG PO CAPS: 10.0000 mg | ORAL_CAPSULE | Freq: Three times a day (TID) | ORAL | Status: DC

## 2012-03-30 MED ORDER — SODIUM CHLORIDE 0.9 % IV SOLN
INTRAVENOUS | Status: DC
  Administered 2012-03-30: 04:00:00 via INTRAVENOUS

## 2012-03-30 MED ORDER — CEPHALEXIN 500 MG PO CAPS: 500.0000 mg | ORAL_CAPSULE | Freq: Four times a day (QID) | ORAL | Status: AC

## 2012-03-30 MED ORDER — DICYCLOMINE HCL 10 MG PO CAPS
10.0000 mg | ORAL_CAPSULE | Freq: Once | ORAL | Status: AC
  Administered 2012-03-30: 10 mg via ORAL
  Filled 2012-03-30: qty 1

## 2012-03-30 MED ORDER — CEPHALEXIN 250 MG PO CAPS
500.0000 mg | ORAL_CAPSULE | Freq: Once | ORAL | Status: AC
  Administered 2012-03-30: 500 mg via ORAL
  Filled 2012-03-30: qty 1

## 2012-03-30 MED ORDER — IOHEXOL 300 MG/ML  SOLN
20.0000 mL | INTRAMUSCULAR | Status: AC
Start: 2012-03-30 — End: 2012-03-30
  Administered 2012-03-30: 20 mL via ORAL

## 2012-03-30 MED ORDER — MORPHINE SULFATE 4 MG/ML IJ SOLN
4.0000 mg | Freq: Once | INTRAMUSCULAR | Status: AC
  Administered 2012-03-30: 4 mg via INTRAVENOUS
  Filled 2012-03-30: qty 1

## 2012-03-30 MED ORDER — ONDANSETRON HCL 4 MG/2ML IJ SOLN
4.0000 mg | Freq: Once | INTRAMUSCULAR | Status: AC
  Administered 2012-03-30: 4 mg via INTRAVENOUS
  Filled 2012-03-30: qty 2

## 2012-03-30 MED ORDER — IOHEXOL 300 MG/ML  SOLN
100.0000 mL | Freq: Once | INTRAMUSCULAR | Status: AC | PRN
  Administered 2012-03-30: 100 mL via INTRAVENOUS

## 2012-03-30 NOTE — ED Notes (Signed)
Per EMS: pt having periumbilical pain. Pain has been going on for 3 days tonight was worse than normal.

## 2012-03-30 NOTE — ED Provider Notes (Signed)
Medical screening examination/treatment/procedure(s) were conducted as a shared visit with non-physician practitioner(s) and myself.  I personally evaluated the patient during the encounter  Cheri Guppy, MD 03/30/12 [phone]

## 2012-03-30 NOTE — ED Provider Notes (Signed)
History     CSN: [id number]  Arrival date & time 03/30/12  0149   First MD Initiated Contact with Patient 03/30/12 0239      Chief Complaint  Patient presents with  . Abdominal Pain    (Consider location/radiation/quality/duration/timing/severity/associated sxs/prior treatment) HPI Comments: Patient here with a three day history of periumbilical abdominal pain associated with nausea and vomiting - he states that he really has had no appetite since this started - states that when he does eat it makes him sick to his stomach - he reports no fever or chills, 3 episodes of NBNB vomiting today, denies constipation or diarrhea, hematuria, dysuria, has a history of GERD but states this does not feel like his reflux - denies any prior surgeries.  Patient also here with right forearm abscess - states that this area opened and drained 2 days ago - denies fever, chills, but reports that continues to hurt - states no further drainage.  Patient is a 61 y.o. male presenting with abdominal pain. The history is provided by the patient. No language interpreter was used.  Abdominal Pain The primary symptoms of the illness include abdominal pain, nausea and vomiting. The primary symptoms of the illness do not include fever, fatigue, shortness of breath, diarrhea, hematemesis, hematochezia or dysuria. The current episode started 1 to 2 hours ago. The onset of the illness was gradual. The problem has been gradually worsening.  The illness is associated with eating. The patient has not had a change in bowel habit. Additional symptoms associated with the illness include anorexia. Symptoms associated with the illness do not include chills, diaphoresis, heartburn, constipation, urgency, hematuria, frequency or back pain.    Past Medical History  Diagnosis Date  . GERD (gastroesophageal reflux disease)   . Hypertension   . Mass     back of head    Past Surgical History  Procedure Date  . Knee surgery    left knee    History reviewed. No pertinent family history.  History  Substance Use Topics  . Smoking status: Current Everyday Smoker -- 0.2 packs/day  . Smokeless tobacco: Not on file  . Alcohol Use: 16.8 oz/week    28 Cans of beer per week      Review of Systems  Constitutional: Negative for fever, chills, diaphoresis and fatigue.  Respiratory: Negative for shortness of breath.   Gastrointestinal: Positive for nausea, vomiting, abdominal pain and anorexia. Negative for heartburn, diarrhea, constipation, hematochezia and hematemesis.  Genitourinary: Negative for dysuria, urgency, frequency and hematuria.  Musculoskeletal: Negative for back pain.  All other systems reviewed and are negative.    Allergies  Review of patient's allergies indicates no known allergies.  Home Medications  No current outpatient prescriptions on file.  BP 105/61  Pulse 104  Temp 98.3 F (36.8 C) (Oral)  Resp 18  SpO2 100%  Physical Exam  Nursing note and vitals reviewed. Constitutional: He is oriented to person, place, and time. He appears well-developed and well-nourished. No distress.  HENT:  Head: Normocephalic and atraumatic.  Right Ear: External ear normal.  Left Ear: External ear normal.  Nose: Nose normal.  Mouth/Throat: Oropharynx is clear and moist. No oropharyngeal exudate.       Multiple teeth missing  Eyes: Conjunctivae are normal. Pupils are equal, round, and reactive to light. No scleral icterus.  Neck: Normal range of motion. Neck supple.  Cardiovascular: Normal rate, regular rhythm and normal heart sounds.  Exam reveals no gallop and no friction rub.  No murmur heard. Pulmonary/Chest: Effort normal and breath sounds normal. No respiratory distress. He has no wheezes. He has no rales. He exhibits no tenderness.  Abdominal: Soft. Bowel sounds are normal. He exhibits no distension and no mass. There is tenderness. There is no rebound, no guarding and no tenderness at  McBurney's point.    Musculoskeletal: Normal range of motion. He exhibits no edema and no tenderness.  Lymphadenopathy:    He has no cervical adenopathy.  Neurological: He is alert and oriented to person, place, and time. No cranial nerve deficit. He exhibits normal muscle tone. Coordination normal.  Skin: Skin is warm and dry. No rash noted. No erythema. No pallor.  Psychiatric: He has a normal mood and affect. His behavior is normal. Judgment and thought content normal.    ED Course  Procedures (including critical care time)  Labs Reviewed  CBC WITH DIFFERENTIAL - Abnormal; Notable for the following:    Lymphs Abs 0.6 (*)     Monocytes Relative 13 (*)     All other components within normal limits  COMPREHENSIVE METABOLIC PANEL - Abnormal; Notable for the following:    Glucose, Bld 141 (*)     GFR calc non Af Amer 76 (*)     GFR calc Af Amer 88 (*)     All other components within normal limits  LIPASE, BLOOD - Abnormal; Notable for the following:    Lipase 10 (*)     All other components within normal limits   No results found. Results for orders placed during the hospital encounter of 03/30/12  CBC WITH DIFFERENTIAL      Component Value Range   WBC 5.0  4.0 - 10.5 K/uL   RBC 4.67  4.22 - 5.81 MIL/uL   Hemoglobin 15.9  13.0 - 17.0 g/dL   HCT 78.2  95.6 - 21.3 %   MCV 95.7  78.0 - 100.0 fL   MCH 34.0  26.0 - 34.0 pg   MCHC 35.6  30.0 - 36.0 g/dL   RDW 08.6  57.8 - 46.9 %   Platelets 245  150 - 400 K/uL   Neutrophils Relative 75  43 - 77 %   Neutro Abs 3.7  1.7 - 7.7 K/uL   Lymphocytes Relative 12  12 - 46 %   Lymphs Abs 0.6 (*) 0.7 - 4.0 K/uL   Monocytes Relative 13 (*) 3 - 12 %   Monocytes Absolute 0.6  0.1 - 1.0 K/uL   Eosinophils Relative 0  0 - 5 %   Eosinophils Absolute 0.0  0.0 - 0.7 K/uL   Basophils Relative 0  0 - 1 %   Basophils Absolute 0.0  0.0 - 0.1 K/uL  COMPREHENSIVE METABOLIC PANEL      Component Value Range   Sodium 135  135 - 145 mEq/L    Potassium 3.7  3.5 - 5.1 mEq/L   Chloride 100  96 - 112 mEq/L   CO2 21  19 - 32 mEq/L   Glucose, Bld 141 (*) 70 - 99 mg/dL   BUN 19  6 - 23 mg/dL   Creatinine, Ser 6.29  0.50 - 1.35 mg/dL   Calcium 9.7  8.4 - 52.8 mg/dL   Total Protein 7.8  6.0 - 8.3 g/dL   Albumin 3.7  3.5 - 5.2 g/dL   AST 27  0 - 37 U/L   ALT 20  0 - 53 U/L   Alkaline Phosphatase 45  39 - 117 U/L  Total Bilirubin 0.7  0.3 - 1.2 mg/dL   GFR calc non Af Amer 76 (*) >90 mL/min   GFR calc Af Amer 88 (*) >90 mL/min  LIPASE, BLOOD      Component Value Range   Lipase 10 (*) 11 - 59 U/L   Ct Abdomen Pelvis W Contrast  03/30/2012  *RADIOLOGY REPORT*  Clinical Data: Abdominal pain  CT ABDOMEN AND PELVIS WITH CONTRAST  Technique:  Multidetector CT imaging of the abdomen and pelvis was performed following the standard protocol during bolus administration of intravenous contrast.  Contrast: OMNIPAQUE IOHEXOL 300 MG/ML  SOLN, 1 OMNIPAQUE IOHEXOL 300 MG/ML  SOLN  Comparison: 01/03/2011 radiograph, 01/02/2011 CT  Findings: Limited images through the lung bases demonstrate no significant appreciable abnormality. The heart size is within normal limits. No pleural or pericardial effusion.  Multiple hypodensities scattered throughout the liver many of which are favored to reflect biliary cysts or hamartomas.  There is a hypodensity within segment five that measures 2 cm (image 21 series 2), with a rim of relative hypoattenuation, unchanged and corresponds to the site of a prior collapsed cyst. Hypodensity adjacent to the falciform ligament likely reflects variant perfusion or focal fat.  Unremarkable spleen and adrenal glands.  There is mild prominence of the main pancreatic duct, measuring up to 3.5 mm.  There are a couple too small to further characterize hypodensities within each kidney.  No hydronephrosis or hydroureter.  There are a few distended small bowel loops up to 3.4 cm with mild wall thickening in the right lower quadrant.  Normal terminal ileum. No bowel obstruction as contrast has reached the cecum.  No CT evidence for colitis.  Normal appendix. No free intraperitoneal air.  No lymphadenopathy.  Patent vasculature.  Circumferential bladder wall thickening.  Small amount of free fluid within the pelvis.  Mildly prominent prostate gland.  Multilevel degenerative changes of the imaged spine. No acute or aggressive appearing osseous lesion.  IMPRESSION: Thickened/distended small bowel loops in the right lower quadrant, may reflect a nonspecific enteritis with infectious, inflammatory, and ischemic considerations.  Small amount of free fluid within the pelvis is presumably reactive.  Circumferential bladder wall thickening is nonspecific.  Correlate with urinalysis.  Numerous hypodensities scattered throughout the liver and kidneys. Some are incompletely characterized however without significant interval change.  Minimal main pancreatic duct prominence is similar to priors dating back to 2009.  Original Report Authenticated By: Waneta Martins, M.D.      Abdominal pain Enteritis Cellulitis   MDM  Patient here with a three day history of crampy abdominal pain and right forearm pain with evidence of cellulitis - area wtihout fluctuance but indurated - will start the patient on antibiotics - abdomen CT without signs of surgical abdomen - will place on antispasmodic and discharge home.        Izola Price [place], [place] 03/30/12 [phone]

## 2012-03-30 NOTE — Discharge Instructions (Signed)

## 2012-03-30 NOTE — ED Provider Notes (Signed)
Medical screening examination/treatment/procedure(s) were conducted as a shared visit with non-physician practitioner(s) and myself.  I personally evaluated the patient during the encounter Periumbilical pain for a few days with n/v. No diarrhea, fever.  No acute abdomen.  Ct no sbo or surgical dz.  Will release home.   Cheri Guppy, MD 03/30/12 [phone]

## 2012-06-09 ENCOUNTER — Emergency Department (HOSPITAL_COMMUNITY): Payer: Medicare Other

## 2012-06-09 ENCOUNTER — Encounter (HOSPITAL_COMMUNITY): Payer: Self-pay | Admitting: Family Medicine

## 2012-06-09 ENCOUNTER — Emergency Department (HOSPITAL_COMMUNITY)
Admission: EM | Admit: 2012-06-09 | Discharge: 2012-06-09 | Disposition: A | Discharge status: Home / Self Care | Payer: Medicare Other | Attending: Emergency Medicine | Admitting: Emergency Medicine

## 2012-06-09 DIAGNOSIS — F17200 Nicotine dependence, unspecified, uncomplicated: Secondary | ICD-10-CM | POA: Insufficient documentation

## 2012-06-09 DIAGNOSIS — L02511 Cutaneous abscess of right hand: Secondary | ICD-10-CM

## 2012-06-09 DIAGNOSIS — K219 Gastro-esophageal reflux disease without esophagitis: Secondary | ICD-10-CM | POA: Insufficient documentation

## 2012-06-09 DIAGNOSIS — L02519 Cutaneous abscess of unspecified hand: Secondary | ICD-10-CM | POA: Insufficient documentation

## 2012-06-09 DIAGNOSIS — L03019 Cellulitis of unspecified finger: Secondary | ICD-10-CM | POA: Insufficient documentation

## 2012-06-09 DIAGNOSIS — I10 Essential (primary) hypertension: Secondary | ICD-10-CM | POA: Insufficient documentation

## 2012-06-09 MED ORDER — AMOXICILLIN-POT CLAVULANATE 875-125 MG PO TABS: 1.0000 | ORAL_TABLET | Freq: Two times a day (BID) | ORAL | Status: AC

## 2012-06-09 MED ORDER — AMOXICILLIN-POT CLAVULANATE 875-125 MG PO TABS
1.0000 | ORAL_TABLET | Freq: Once | ORAL | Status: AC
  Administered 2012-06-09: 1 via ORAL
  Filled 2012-06-09: qty 1

## 2012-06-09 MED ORDER — LIDOCAINE HCL (PF) 1 % IJ SOLN
5.0000 mL | Freq: Once | INTRAMUSCULAR | Status: AC
  Administered 2012-06-09: 5 mL via INTRADERMAL
  Filled 2012-06-09: qty 5

## 2012-06-09 MED ORDER — HYDROCODONE-ACETAMINOPHEN 5-325 MG PO TABS: 1.0000 | ORAL_TABLET | ORAL | Status: AC | PRN
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
## 2012-06-12 NOTE — ED Provider Notes (Signed)
Medical screening examination/treatment/procedure(s) were performed by non-physician practitioner and as supervising physician I was immediately available for consultation/collaboration.   Shelda Jakes, MD 06/12/12 2253

## 2012-12-17 ENCOUNTER — Emergency Department (HOSPITAL_COMMUNITY)
Admission: EM | Admit: 2012-12-17 | Discharge: 2012-12-17 | Disposition: A | Discharge status: Home / Self Care | Payer: Medicare Other | Attending: Emergency Medicine | Admitting: Emergency Medicine

## 2012-12-17 DIAGNOSIS — R221 Localized swelling, mass and lump, neck: Secondary | ICD-10-CM | POA: Insufficient documentation

## 2012-12-17 DIAGNOSIS — R220 Localized swelling, mass and lump, head: Secondary | ICD-10-CM | POA: Insufficient documentation

## 2012-12-17 DIAGNOSIS — I10 Essential (primary) hypertension: Secondary | ICD-10-CM | POA: Insufficient documentation

## 2012-12-17 DIAGNOSIS — R6884 Jaw pain: Secondary | ICD-10-CM | POA: Insufficient documentation

## 2012-12-17 DIAGNOSIS — Z79899 Other long term (current) drug therapy: Secondary | ICD-10-CM | POA: Insufficient documentation

## 2012-12-17 DIAGNOSIS — K089 Disorder of teeth and supporting structures, unspecified: Secondary | ICD-10-CM | POA: Insufficient documentation

## 2012-12-17 DIAGNOSIS — Z8719 Personal history of other diseases of the digestive system: Secondary | ICD-10-CM | POA: Insufficient documentation

## 2012-12-17 DIAGNOSIS — F17200 Nicotine dependence, unspecified, uncomplicated: Secondary | ICD-10-CM | POA: Insufficient documentation

## 2012-12-17 DIAGNOSIS — K029 Dental caries, unspecified: Secondary | ICD-10-CM | POA: Insufficient documentation

## 2012-12-17 DIAGNOSIS — K0889 Other specified disorders of teeth and supporting structures: Secondary | ICD-10-CM

## 2012-12-17 MED ORDER — KETOROLAC TROMETHAMINE 60 MG/2ML IM SOLN
60.0000 mg | Freq: Once | INTRAMUSCULAR | Status: AC
Start: 2012-12-17 — End: 2012-12-17
  Administered 2012-12-17: 60 mg via INTRAMUSCULAR
  Filled 2012-12-17: qty 2

## 2012-12-17 MED ORDER — HYDROCODONE-ACETAMINOPHEN 5-325 MG PO TABS: 1.0000 | ORAL_TABLET | Freq: Four times a day (QID) | ORAL | Status: DC | PRN

## 2012-12-17 MED ORDER — PENICILLIN V POTASSIUM 500 MG PO TABS: 500.0000 mg | ORAL_TABLET | Freq: Four times a day (QID) | ORAL | Status: AC

## 2012-12-17 NOTE — ED Provider Notes (Signed)
Medical screening examination/treatment/procedure(s) were performed by non-physician practitioner and as supervising physician I was immediately available for consultation/collaboration.   Albaro Deviney, MD 12/17/12 1541 

## 2012-12-17 NOTE — ED Notes (Signed)
Patient reports  Pain in the left upper gum region for 4wks, with the pain getting worse over the past couple of days

## 2012-12-17 NOTE — ED Provider Notes (Signed)
History     CSN: [id number]  Arrival date & time 12/17/12  1019   First MD Initiated Contact with Patient 12/17/12 1036      Chief Complaint  Patient presents with  . Dental Pain    (Consider location/radiation/quality/duration/timing/severity/associated sxs/prior treatment) Patient is a 62 y.o. male presenting with tooth pain. The history is provided by the patient and medical records. No language interpreter was used.  Dental PainThe primary symptoms include mouth pain. Primary symptoms do not include dental injury, oral bleeding, oral lesions, headaches, fever, shortness of breath, sore throat, angioedema or cough. The symptoms began more than 1 week ago. The symptoms are worsening. The symptoms are new. The symptoms occur constantly.  Additional symptoms include: dental sensitivity to temperature, gum swelling, gum tenderness and jaw pain. Additional symptoms do not include: purulent gums, trismus, facial swelling, trouble swallowing, pain with swallowing, excessive salivation, dry mouth, taste disturbance, smell disturbance, drooling, ear pain, hearing loss, nosebleeds, swollen glands, goiter and fatigue. Medical issues include: alcohol problem and smoking. Medical issues do not include: chewing tobacco, immunosuppression and cancer.    61 year old male presents emergency chief complaint of dental pain.  States it has been in his upper gum for probably 4 weeks and worsening.  Has been difficult to eat over the past 4 days.  The patient denies any difficulty breathing or swallowing.  He denies any symptoms of systemic infection such as fevers, chills, nausea, vomiting.  Patient has no history of previous abscess.  He does have poor dentition throughout.  Past Medical History  Diagnosis Date  . GERD (gastroesophageal reflux disease)   . Hypertension   . Mass     back of head    Past Surgical History  Procedure Laterality Date  . Knee surgery      left knee    No family history  on file.  History  Substance Use Topics  . Smoking status: Current Every Day Smoker -- 0.25 packs/day  . Smokeless tobacco: Not on file  . Alcohol Use: 16.8 oz/week    28 Cans of beer per week      Review of Systems  Constitutional: Negative for fever and fatigue.  HENT: Negative for hearing loss, ear pain, nosebleeds, sore throat, facial swelling, drooling and trouble swallowing.   Respiratory: Negative for cough and shortness of breath.   Neurological: Negative for headaches.    Allergies  Review of patient's allergies indicates no known allergies.  Home Medications   Current Outpatient Rx  Name  Route  Sig  Dispense  Refill  . acetaminophen (TYLENOL) 500 MG tablet   Oral   Take 1,000 mg by mouth every 6 (six) hours as needed for pain. For headache         . albuterol (PROVENTIL HFA;VENTOLIN HFA) 108 (90 BASE) MCG/ACT inhaler   Inhalation   Inhale 2 puffs into the lungs every 6 (six) hours as needed. For shortness of breath         . HYDROcodone-acetaminophen (NORCO) 5-325 MG per tablet   Oral   Take 1-2 tablets by mouth every 6 (six) hours as needed for pain.   20 tablet   0   . penicillin v potassium (VEETID) 500 MG tablet   Oral   Take 1 tablet (500 mg total) by mouth 4 (four) times daily.   40 tablet   0     BP 141/86  Pulse 63  Resp 16  SpO2 100%  Physical Exam  Nursing note  and vitals reviewed. Constitutional: He appears well-developed and well-nourished. No distress.  HENT:  Head: Normocephalic and atraumatic. No trismus in the jaw.  Mouth/Throat: Uvula is midline. No oral lesions. Abnormal dentition. Dental caries present. No dental abscesses, edematous or lacerations.  Tenderness to palpation of the left upper gum line.  The patient has multiple caries and missing teeth with probable periodontal disease and halitosis. No areas of fluctuance.  The tissue is swollen and indurated.  Eyes: Conjunctivae are normal. No scleral icterus.  Neck:  Normal range of motion. Neck supple.  Cardiovascular: Normal rate, regular rhythm and normal heart sounds.   Pulmonary/Chest: Effort normal and breath sounds normal. No respiratory distress.  Abdominal: Soft. There is no tenderness.  Musculoskeletal: He exhibits no edema.  Neurological: He is alert.  Skin: Skin is warm and dry. He is not diaphoretic.  Psychiatric: His behavior is normal.    ED Course  Procedures (including critical care time)  Labs Reviewed - No data to display No results found.   1. Pain, dental       MDM  11:33 AM BP 141/86  Pulse 63  Resp 16  SpO2 100% Patient with gum pain and likely developing abscess without fluctuance,.  No gross abscess.  Exam unconcerning for Ludwig's angina or spread of infection.  Will treat with penicillin and pain medicine.  Patient given dental followup and explicit instructions.         Arthor Captain, PA-C 12/17/12 1134

## 2012-12-17 NOTE — ED Notes (Signed)
Unable to obtain temperature at this time due to patient consuming drink.  Instructed patient to not drink for 15 minutes so we can recheck temperature at that time.

## 2013-01-30 ENCOUNTER — Encounter (HOSPITAL_COMMUNITY): Payer: Self-pay

## 2013-01-30 ENCOUNTER — Emergency Department (HOSPITAL_COMMUNITY)
Admission: EM | Admit: 2013-01-30 | Discharge: 2013-01-30 | Disposition: A | Discharge status: Home / Self Care | Payer: Medicare Other | Attending: Emergency Medicine | Admitting: Emergency Medicine

## 2013-01-30 DIAGNOSIS — Z872 Personal history of diseases of the skin and subcutaneous tissue: Secondary | ICD-10-CM | POA: Insufficient documentation

## 2013-01-30 DIAGNOSIS — Z79899 Other long term (current) drug therapy: Secondary | ICD-10-CM | POA: Insufficient documentation

## 2013-01-30 DIAGNOSIS — K0889 Other specified disorders of teeth and supporting structures: Secondary | ICD-10-CM

## 2013-01-30 DIAGNOSIS — I10 Essential (primary) hypertension: Secondary | ICD-10-CM | POA: Insufficient documentation

## 2013-01-30 DIAGNOSIS — Z8719 Personal history of other diseases of the digestive system: Secondary | ICD-10-CM | POA: Insufficient documentation

## 2013-01-30 DIAGNOSIS — K089 Disorder of teeth and supporting structures, unspecified: Secondary | ICD-10-CM | POA: Insufficient documentation

## 2013-01-30 DIAGNOSIS — F17200 Nicotine dependence, unspecified, uncomplicated: Secondary | ICD-10-CM | POA: Insufficient documentation

## 2013-01-30 MED ORDER — PENICILLIN V POTASSIUM 500 MG PO TABS: 500.0000 mg | ORAL_TABLET | Freq: Four times a day (QID) | ORAL | Status: DC

## 2013-01-30 MED ORDER — OXYCODONE-ACETAMINOPHEN 5-325 MG PO TABS: ORAL_TABLET | ORAL | Status: DC

## 2013-01-30 NOTE — ED Notes (Signed)
Pt. Having lower dental pain.  Did not see  the the dentist, but has to have the money first.  Is here for pain medication. He called a made an appointment, but was told to come back to [place] for pain control.

## 2013-01-30 NOTE — ED Provider Notes (Signed)
Medical screening examination/treatment/procedure(s) were performed by non-physician practitioner and as supervising physician I was immediately available for consultation/collaboration.  Flint Melter, MD 01/30/13 2149

## 2013-01-30 NOTE — ED Provider Notes (Signed)
History     CSN: [id number]  Arrival date & time 01/30/13  4098   First MD Initiated Contact with Patient 01/30/13 [phone]      Chief Complaint  Patient presents with  . Dental Pain    (Consider location/radiation/quality/duration/timing/severity/associated sxs/prior treatment) HPI  [name] is a 62 y.o. male complaining of on-going tooth pain for which he was seen last month Pt states he is back here today because he was referred to a dentist but he waited too long after the referral to make an appointment. He called the office and they agreed to see him for an affordable price but he needs another referral. He is having ongoing tooth pain in especially in his left maxilla. He denies fever or other illness. During his last visit to the ED, he was given pain medicine and antibiotics which he finished his full course. Denies fever, nausea vomiting, difficulty swallowing his secretions the  Past Medical History  Diagnosis Date  . GERD (gastroesophageal reflux disease)   . Hypertension   . Mass     back of head    Past Surgical History  Procedure Laterality Date  . Knee surgery      left knee    No family history on file.  History  Substance Use Topics  . Smoking status: Current Every Day Smoker -- 0.25 packs/day  . Smokeless tobacco: Not on file  . Alcohol Use: 16.8 oz/week    28 Cans of beer per week      Review of Systems  Constitutional: Negative for fever, chills, activity change and appetite change.  HENT: Positive for dental problem. Negative for sore throat, drooling, mouth sores and trouble swallowing.   Respiratory: Negative for shortness of breath.   Cardiovascular: Negative for chest pain.  Gastrointestinal: Negative for nausea, vomiting, diarrhea and constipation.  Musculoskeletal: Negative for myalgias.  Skin: Negative for rash.  All other systems reviewed and are negative.    Allergies  Review of patient's allergies indicates no known  allergies.  Home Medications   Current Outpatient Rx  Name  Route  Sig  Dispense  Refill  . acetaminophen (TYLENOL) 500 MG tablet   Oral   Take 1,000 mg by mouth every 6 (six) hours as needed for pain. For headache         . albuterol (PROVENTIL HFA;VENTOLIN HFA) 108 (90 BASE) MCG/ACT inhaler   Inhalation   Inhale 2 puffs into the lungs every 6 (six) hours as needed. For shortness of breath         . HYDROcodone-acetaminophen (NORCO) 5-325 MG per tablet   Oral   Take 1-2 tablets by mouth every 6 (six) hours as needed for pain.   20 tablet   0     BP 138/84  Pulse 75  Temp(Src) 97.6 F (36.4 C) (Oral)  SpO2 99%  Physical Exam  Nursing note and vitals reviewed. Constitutional: He is oriented to person, place, and time. He appears well-developed and well-nourished. No distress.  HENT:  Head: Normocephalic.  Mouth/Throat: Oropharynx is clear and moist.  Generally poor dentition, no gingival swelling, erythema or tenderness to palpation. Patient is handling his secretions. There is no tenderness to palpation or firmness underneath tongue bilaterally. No trismus.    Eyes: Conjunctivae and EOM are normal. Pupils are equal, round, and reactive to light.  Neck: Normal range of motion.  Cardiovascular: Normal rate.   Pulmonary/Chest: Effort normal. No stridor.  Abdominal: Soft.  Musculoskeletal: Normal range of  motion.  Neurological: He is alert and oriented to person, place, and time.  Psychiatric: He has a normal mood and affect.    ED Course  Procedures (including critical care time)  Labs Reviewed - No data to display No results found.   1. Pain, dental       MDM   [name] is a 62 y.o. male with chronic dental pain, no concern for Ludwig angina. I will start him on antibiotics prophylactically.   Filed Vitals:   01/30/13 0921  BP: 138/84  Pulse: 75  Temp: 97.6 F (36.4 C)  TempSrc: Oral  SpO2: 99%     Pt verbalized understanding and  agrees with care plan. Outpatient follow-up and return precautions given.    Discharge Medication List as of 01/30/2013  9:34 AM    START taking these medications   Details  oxyCODONE-acetaminophen (PERCOCET/ROXICET) 5-325 MG per tablet 1 to 2 tabs PO q6hrs  PRN for pain, Print    penicillin v potassium (VEETID) 500 MG tablet Take 1 tablet (500 mg total) by mouth 4 (four) times daily., Starting 01/30/2013, Until Discontinued, [employer], PA-C 01/30/13 [phone]

## 2013-02-06 ENCOUNTER — Emergency Department (HOSPITAL_COMMUNITY): Payer: Medicare Other

## 2013-02-06 ENCOUNTER — Emergency Department (HOSPITAL_COMMUNITY)
Admission: EM | Admit: 2013-02-06 | Discharge: 2013-02-06 | Disposition: A | Discharge status: Home / Self Care | Payer: Medicare Other | Attending: Emergency Medicine | Admitting: Emergency Medicine

## 2013-02-06 ENCOUNTER — Encounter (HOSPITAL_COMMUNITY): Payer: Self-pay | Admitting: Emergency Medicine

## 2013-02-06 DIAGNOSIS — W268XXA Contact with other sharp object(s), not elsewhere classified, initial encounter: Secondary | ICD-10-CM | POA: Insufficient documentation

## 2013-02-06 DIAGNOSIS — H209 Unspecified iridocyclitis: Secondary | ICD-10-CM | POA: Insufficient documentation

## 2013-02-06 DIAGNOSIS — I10 Essential (primary) hypertension: Secondary | ICD-10-CM | POA: Insufficient documentation

## 2013-02-06 DIAGNOSIS — F17200 Nicotine dependence, unspecified, uncomplicated: Secondary | ICD-10-CM | POA: Insufficient documentation

## 2013-02-06 DIAGNOSIS — H53149 Visual discomfort, unspecified: Secondary | ICD-10-CM | POA: Insufficient documentation

## 2013-02-06 DIAGNOSIS — Z8719 Personal history of other diseases of the digestive system: Secondary | ICD-10-CM | POA: Insufficient documentation

## 2013-02-06 DIAGNOSIS — Y9389 Activity, other specified: Secondary | ICD-10-CM | POA: Insufficient documentation

## 2013-02-06 DIAGNOSIS — H5789 Other specified disorders of eye and adnexa: Secondary | ICD-10-CM | POA: Insufficient documentation

## 2013-02-06 DIAGNOSIS — Y929 Unspecified place or not applicable: Secondary | ICD-10-CM | POA: Insufficient documentation

## 2013-02-06 DIAGNOSIS — H539 Unspecified visual disturbance: Secondary | ICD-10-CM | POA: Insufficient documentation

## 2013-02-06 LAB — POCT I-STAT, CHEM 8
BUN: 8 mg/dL (ref 6–23)
Calcium, Ion: 1.22 mmol/L (ref 1.13–1.30)
Creatinine, Ser: 1 mg/dL (ref 0.50–1.35)
Glucose, Bld: 72 mg/dL (ref 70–99)
TCO2: 29 mmol/L (ref 0–100)

## 2013-02-06 MED ORDER — FLUOROMETHOLONE 0.25 % OP SUSP: 1.0000 [drp] | Freq: Two times a day (BID) | OPHTHALMIC | Status: DC

## 2013-02-06 MED ORDER — TETRACAINE HCL 0.5 % OP SOLN
2.0000 [drp] | Freq: Once | OPHTHALMIC | Status: AC
  Administered 2013-02-06: 2 [drp] via OPHTHALMIC
  Filled 2013-02-06: qty 2

## 2013-02-06 MED ORDER — FLUORESCEIN SODIUM 1 MG OP STRP
1.0000 | ORAL_STRIP | Freq: Once | OPHTHALMIC | Status: AC
  Administered 2013-02-06: 1 via OPHTHALMIC
  Filled 2013-02-06: qty 1

## 2013-02-06 MED ORDER — HYDROCODONE-ACETAMINOPHEN 5-325 MG PO TABS
2.0000 | ORAL_TABLET | Freq: Once | ORAL | Status: AC
  Administered 2013-02-06: 2 via ORAL
  Filled 2013-02-06: qty 2

## 2013-02-06 MED ORDER — ONDANSETRON 4 MG PO TBDP
8.0000 mg | ORAL_TABLET | Freq: Once | ORAL | Status: AC
  Administered 2013-02-06: 8 mg via ORAL
  Filled 2013-02-06: qty 2

## 2013-02-06 MED ORDER — HOMATROPINE HBR 5 % OP SOLN: 1.0000 [drp] | Freq: Three times a day (TID) | OPHTHALMIC | Status: DC

## 2013-02-06 NOTE — ED Provider Notes (Signed)
Care assumed from [facility], [place].  [name] is a 62 y.o. male who presents after hitting himself in the right eye with a piece of sheet metal.  He presents with pain and redness to the right eye.  Pt also evaluated by Jerelyn Scott, MD.    Fluorescein stain without uptake, evidence of corneal abrasion, laceration or visualization of ruptured globe.  Pt without increased intraocular pressure (R 14; L 18).  Pt CT scan pending to r/o globe rupture. Pt reports blurry vision to right eye but visual acuity largely intact (R 20/30; L 20/20).  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  Will await CT scan and consult with opthalmology before d/c.    5:05 PM Ct scan without evidence of globe rupture. Will consult Opthalmology.    5:48 PM Discussed with Dr Karleen Hampshire who recommends homatropine and FML with outpatient f/u in 1 week.  I have also discussed reasons to return immediately to the ER.  Patient expresses understanding and agrees with plan.    Dahlia Client Hosanna Betley, PA-C 02/06/13 1749

## 2013-02-06 NOTE — ED Notes (Signed)
Patient states he was hammering out some tin to straighten it when some of the tin flew back and hit him in his R eye.  Patient states cannot open the eye, hurts, light sensitivity and blurred vision since.

## 2013-02-06 NOTE — ED Provider Notes (Signed)
4:24 PM Pt seen by me in fast trace. He has diffuse conjunctival injection, no hyphema.  Visual acuity 20/40 in affected eye.  No seidel's sign on flouroscein stain or obvious corneal abrasion.  CT orbit ordered.  Pt given pain medication.  D/w PA will need intraocular pressures measured and optho consult after CT obtained.   Ethelda Chick, MD 02/06/13 [phone]

## 2013-02-06 NOTE — ED Provider Notes (Signed)
History     CSN: [id number]  Arrival date & time 02/06/13  1214   First MD Initiated Contact with Patient 02/06/13 1233      Chief Complaint  Patient presents with  . Eye Pain    (Consider location/radiation/quality/duration/timing/severity/associated sxs/prior treatment) HPI Comments: Patient is a 62 year old male who presents today with 2 days of worsening eye pain and photophobia. He was working with a large piece of tin when it hit him in the eye. He states it is very difficult for him to watch tv and his vision is blurry. He complains of eye pain and redness. He denies discharge. The pain is better when he closes his eye. No double vision, floaters, loss of vision, fever, chills, nausea, vomiting, weakness.   The history is provided by the patient. No language interpreter was used.    Past Medical History  Diagnosis Date  . GERD (gastroesophageal reflux disease)   . Hypertension   . Mass     back of head    Past Surgical History  Procedure Laterality Date  . Knee surgery      left knee    History reviewed. No pertinent family history.  History  Substance Use Topics  . Smoking status: Current Every Day Smoker -- 0.25 packs/day  . Smokeless tobacco: Not on file  . Alcohol Use: 16.8 oz/week    28 Cans of beer per week      Review of Systems  Constitutional: Negative for fever and chills.  Eyes: Positive for photophobia, pain, redness and visual disturbance. Negative for discharge.  Respiratory: Negative for shortness of breath.   All other systems reviewed and are negative.    Allergies  Review of patient's allergies indicates no known allergies.  Home Medications   Current Outpatient Rx  Name  Route  Sig  Dispense  Refill  . acetaminophen (TYLENOL) 500 MG tablet   Oral   Take 1,000 mg by mouth every 6 (six) hours as needed for pain. For headache         . albuterol (PROVENTIL HFA;VENTOLIN HFA) 108 (90 BASE) MCG/ACT inhaler   Inhalation   Inhale 2  puffs into the lungs every 6 (six) hours as needed. For shortness of breath         . oxyCODONE-acetaminophen (PERCOCET/ROXICET) 5-325 MG per tablet      1 to 2 tabs PO q6hrs  PRN for pain   15 tablet   0   . penicillin v potassium (VEETID) 500 MG tablet   Oral   Take 1 tablet (500 mg total) by mouth 4 (four) times daily.   40 tablet   0     BP 143/87  Pulse 72  Temp(Src) 97.9 F (36.6 C) (Oral)  Resp 18  SpO2 99%  Physical Exam  Nursing note and vitals reviewed. Constitutional: He is oriented to person, place, and time. He appears well-developed and well-nourished. No distress.  HENT:  Head: Normocephalic and atraumatic.  Right Ear: External ear normal.  Left Ear: External ear normal.  Nose: Nose normal.  Eyes: No foreign bodies found. Right eye exhibits no discharge and no exudate. Left eye exhibits no discharge and no exudate. Right conjunctiva is injected. Right conjunctiva has no hemorrhage.  Conjunctiva injected in right eye, periorbital swelling Fluorescein exam done, no corneal ulcers or abrasions visualized, no seidel sign  EOM intact Tonopen: 14 right eye; 18 left eye Visual Acuity: 20/30 right eye; 20/20 in left eye  Neck: Normal range of motion. No tracheal deviation present.  Cardiovascular: Normal rate, regular rhythm and normal heart sounds.   Pulmonary/Chest: Effort normal and breath sounds normal. No stridor.  Abdominal: Soft. He exhibits no distension. There is no tenderness.  Musculoskeletal: Normal range of motion.  Neurological: He is alert and oriented to person, place, and time.  Skin: Skin is warm and dry. He is not diaphoretic.  Psychiatric: He has a normal mood and affect. His behavior is normal.    ED Course  Procedures (including critical care time)  Labs Reviewed  POCT I-STAT, CHEM 8   Ct Orbitss W/o Cm  02/06/2013  *RADIOLOGY REPORT*  Clinical Data: Struck in right eye with small bit of metal. Decreased vision with light  sensitivity and difficulty opening the eye.  CT ORBITS WITHOUT CONTRAST  Technique:  Multidetector CT imaging of the orbits was performed following the standard protocol without intravenous contrast.  Comparison: None.  Findings: There is mild preseptal periorbital soft tissue swelling on the right.  No radiopaque foreign body is evident.  There is no post-septal edema.  Bony confines the orbit are intact.  There is no blowout fracture.  No radiopaque foreign body can be seen in the malar soft tissues.  The there is no facial fracture evident.  The left orbit is normal.  Visualized intracranial compartment shows mild atrophy.  There is mild paranasal sinus disease.  IMPRESSION: Mild right preseptal periorbital soft tissue swelling without visible radiopaque foreign body or facial fracture.  Please note that CT orbits does not exclude a corneal abrasion or laceration.  Correlate with direct examination of the globe.   Original Report Authenticated By: Davonna Belling, M.D.      1. Traumatic iritis       MDM  Patient presents with eye pain x 2 days. Visual acuity 20/30 affected eye, 20/20 in unaffected eye. Fluorescein stain done - no Seidel sign, no evidence of abrasion, or ulcer. No foreign bodies visualized. Tonopen results WNL. Patient signed out to [facility], PA-C at time of shift change. Patient stable at this time. CT pending. At this time suspect traumatic iritis, plan to consult optho. Patient / Family / Caregiver informed of clinical course, understand medical decision-making process, and agree with plan.       Mora Bellman, PA-C 02/07/13 2138

## 2013-02-06 NOTE — ED Notes (Signed)
Pt sts piece of tin hit pt in right eye 2 days ago; pt sts increasing pain and pain with light

## 2013-02-06 NOTE — ED Notes (Signed)
Meal given to patient 

## 2013-02-07 NOTE — ED Provider Notes (Signed)
Medical screening examination/treatment/procedure(s) were performed by non-physician practitioner and as supervising physician I was immediately available for consultation/collaboration.   Carleene Cooper III, MD 02/07/13 [phone]

## 2013-02-08 NOTE — ED Provider Notes (Signed)
Medical screening examination/treatment/procedure(s) were conducted as a shared visit with non-physician practitioner(s) and myself.  I personally evaluated the patient during the encounter  Pt seen and examined. No obvious corneal abrasion, visual acuity 20/30- left, right 20/20.  IOP measured and normal.  No hyphema.  CT orbits ordered to further evaluate for globe rupture, or retained foreign body.  Will need consult to ophtho.    Ethelda Chick, MD 02/08/13 [phone]

## 2013-05-27 ENCOUNTER — Encounter (HOSPITAL_COMMUNITY): Payer: Self-pay | Admitting: *Deleted

## 2013-05-27 ENCOUNTER — Emergency Department (HOSPITAL_COMMUNITY)
Admission: EM | Admit: 2013-05-27 | Discharge: 2013-05-27 | Disposition: A | Discharge status: Home / Self Care | Payer: Medicare Other | Attending: Emergency Medicine | Admitting: Emergency Medicine

## 2013-05-27 DIAGNOSIS — Z23 Encounter for immunization: Secondary | ICD-10-CM | POA: Insufficient documentation

## 2013-05-27 DIAGNOSIS — Y929 Unspecified place or not applicable: Secondary | ICD-10-CM | POA: Insufficient documentation

## 2013-05-27 DIAGNOSIS — Y9389 Activity, other specified: Secondary | ICD-10-CM | POA: Insufficient documentation

## 2013-05-27 DIAGNOSIS — Z8719 Personal history of other diseases of the digestive system: Secondary | ICD-10-CM | POA: Insufficient documentation

## 2013-05-27 DIAGNOSIS — Z79899 Other long term (current) drug therapy: Secondary | ICD-10-CM | POA: Insufficient documentation

## 2013-05-27 DIAGNOSIS — F17200 Nicotine dependence, unspecified, uncomplicated: Secondary | ICD-10-CM | POA: Insufficient documentation

## 2013-05-27 DIAGNOSIS — S61412A Laceration without foreign body of left hand, initial encounter: Secondary | ICD-10-CM

## 2013-05-27 DIAGNOSIS — S61409A Unspecified open wound of unspecified hand, initial encounter: Secondary | ICD-10-CM | POA: Insufficient documentation

## 2013-05-27 DIAGNOSIS — W268XXA Contact with other sharp object(s), not elsewhere classified, initial encounter: Secondary | ICD-10-CM | POA: Insufficient documentation

## 2013-05-27 DIAGNOSIS — I10 Essential (primary) hypertension: Secondary | ICD-10-CM | POA: Insufficient documentation

## 2013-05-27 MED ORDER — TETANUS-DIPHTH-ACELL PERTUSSIS 5-2.5-18.5 LF-MCG/0.5 IM SUSP
0.5000 mL | Freq: Once | INTRAMUSCULAR | Status: AC
  Administered 2013-05-27: 0.5 mL via INTRAMUSCULAR
  Filled 2013-05-27: qty 0.5

## 2013-05-27 NOTE — ED Notes (Signed)
Tiffany, EMT to clean patient hand and set up lac tray for PA.

## 2013-05-27 NOTE — ED Provider Notes (Signed)
Medical screening examination/treatment/procedure(s) were performed by non-physician practitioner and as supervising physician I was immediately available for consultation/collaboration.   Junius Argyle, MD 05/27/13 2008

## 2013-05-27 NOTE — ED Notes (Signed)
Pt reports cutting his hand on a TV.  Noted to have a laceration on (L) ring finger.  Bleeding controlled in triage.  Dressing applied.  Unknown last tetanus.

## 2013-05-27 NOTE — ED Provider Notes (Signed)
CSN: [id number]     Arrival date & time 05/27/13  1257 History   First MD Initiated Contact with Patient 05/27/13 1307     Chief Complaint  Patient presents with  . Extremity Laceration   (Consider location/radiation/quality/duration/timing/severity/associated sxs/prior Treatment) HPI  [name] is a 62 y.o.male with a significant PMH of hypertension, GERD presents to the ER with complaints of laceration in between his left fingers. He was lifting a TV and it somehow cut his fingers. The bleeding is controlled and he has full use of his hand. He is unsure of his last tetanus shot. No other injury, nad   Past Medical History  Diagnosis Date  . GERD (gastroesophageal reflux disease)   . Hypertension   . Mass     back of head   Past Surgical History  Procedure Laterality Date  . Knee surgery      left knee   History reviewed. No pertinent family history. History  Substance Use Topics  . Smoking status: Current Every Day Smoker -- 0.25 packs/day  . Smokeless tobacco: Not on file  . Alcohol Use: 16.8 oz/week    28 Cans of beer per week    Review of Systems ROS is negative unless otherwise stated in the HPI  Allergies  Review of patient's allergies indicates no known allergies.  Home Medications   Current Outpatient Rx  Name  Route  Sig  Dispense  Refill  . homatropine 5 % ophthalmic solution   Right Eye   Place 1 drop into the right eye 3 (three) times daily. 1 drop TID for 1 week   5 mL   0   . albuterol (PROVENTIL HFA;VENTOLIN HFA) 108 (90 BASE) MCG/ACT inhaler   Inhalation   Inhale 2 puffs into the lungs every 6 (six) hours as needed. For shortness of breath          BP 117/63  Pulse 70  Temp(Src) 97.8 F (36.6 C) (Oral)  Resp 20  SpO2 97% Physical Exam  Nursing note and vitals reviewed. Constitutional: He appears well-developed and well-nourished. No distress.  HENT:  Head: Normocephalic and atraumatic.  Eyes: Pupils are equal, round, and  reactive to light.  Neck: Normal range of motion. Neck supple.  Cardiovascular: Normal rate and regular rhythm.   Pulmonary/Chest: Effort normal.  Abdominal: Soft.  Musculoskeletal:       Left hand: He exhibits tenderness and laceration. He exhibits normal range of motion, normal two-point discrimination, normal capillary refill, no deformity and no swelling. Normal sensation noted. Normal strength noted.       Hands: Neurological: He is alert.  Skin: Skin is warm and dry.    ED Course  Procedures (including critical care time) Labs Review Labs Reviewed - No data to display Imaging Review No results found.  MDM   1. Hand laceration, left, initial encounter    LACERATION REPAIR Performed by: Dorthula Matas Authorized by: Dorthula Matas Consent: Verbal consent obtained. Risks and benefits: risks, benefits and alternatives were discussed Consent given by: patient Patient identity confirmed: provided demographic data Prepped and Draped in normal sterile fashion Wound explored  Laceration Location: left hand  Laceration Length: 2.5 cm  No Foreign Bodies seen or palpated  Anesthesia: local infiltration  Local anesthetic: lidocaine 1% wo epinephrine  Anesthetic total: 3 ml  Irrigation method: syringe Amount of cleaning: standard  Skin closure: sutures  Number of sutures: 5  Technique: simple interrupted  Patient tolerance: Patient tolerated the procedure well with  no immediate complications.   Sutures need to remain clean and dry.  Sutures to be removed in 7 days. Tetanus given in ED  62 y.o.Pearly Apachito Hue's evaluation in the Emergency Department is complete. It has been determined that no acute conditions requiring further emergency intervention are present at this time. The patient/guardian have been advised of the diagnosis and plan. We have discussed signs and symptoms that warrant return to the ED, such as changes or worsening in symptoms.  Vital  signs are stable at discharge. Filed Vitals:   05/27/13 1303  BP: 117/63  Pulse: 70  Temp: 97.8 F (36.6 C)  Resp: 20    Patient/guardian has voiced understanding and agreed to follow-up with the PCP or specialist.   Dorthula Matas, PA-C 05/27/13 1446

## 2013-06-05 ENCOUNTER — Emergency Department (HOSPITAL_COMMUNITY)
Admission: EM | Admit: 2013-06-05 | Discharge: 2013-06-05 | Disposition: A | Discharge status: Home / Self Care | Payer: Medicare Other | Attending: Emergency Medicine | Admitting: Emergency Medicine

## 2013-06-05 DIAGNOSIS — IMO0001 Reserved for inherently not codable concepts without codable children: Secondary | ICD-10-CM | POA: Insufficient documentation

## 2013-06-05 DIAGNOSIS — F17200 Nicotine dependence, unspecified, uncomplicated: Secondary | ICD-10-CM | POA: Insufficient documentation

## 2013-06-05 DIAGNOSIS — Z79899 Other long term (current) drug therapy: Secondary | ICD-10-CM | POA: Insufficient documentation

## 2013-06-05 DIAGNOSIS — Z4802 Encounter for removal of sutures: Secondary | ICD-10-CM

## 2013-06-05 DIAGNOSIS — I10 Essential (primary) hypertension: Secondary | ICD-10-CM | POA: Insufficient documentation

## 2013-06-05 DIAGNOSIS — Z8719 Personal history of other diseases of the digestive system: Secondary | ICD-10-CM | POA: Insufficient documentation

## 2013-06-05 MED ORDER — TRAMADOL HCL 50 MG PO TABS
50.0000 mg | ORAL_TABLET | Freq: Once | ORAL | Status: AC
  Administered 2013-06-05: 50 mg via ORAL
  Filled 2013-06-05: qty 1

## 2013-06-05 NOTE — ED Provider Notes (Signed)
CSN: [id number]     Arrival date & time 06/05/13  0820 History   First MD Initiated Contact with Patient 06/05/13 [phone]     Chief Complaint  Patient presents with  . Suture / Staple Removal   (Consider location/radiation/quality/duration/timing/severity/associated sxs/prior Treatment) HPI Comments: Patient is a 62 year old male presented to the emergency department for suture removal to left hand. Patient presented on August 27 for initial evaluation. He states the laceration has been healing well with no drainage. He still endorses some pain to the left hand particularly with extension and flexion of hand. He states he has taken occasional Motrin which has provided minimal relief. He denies any numbness or tingling.  Patient is a 62 y.o. male presenting with suture removal.  Suture / Staple Removal Associated symptoms include myalgias. Pertinent negatives include no chills, fever, joint swelling or rash.    Past Medical History  Diagnosis Date  . GERD (gastroesophageal reflux disease)   . Hypertension   . Mass     back of head   Past Surgical History  Procedure Laterality Date  . Knee surgery      left knee   No family history on file. History  Substance Use Topics  . Smoking status: Current Every Day Smoker -- 0.25 packs/day  . Smokeless tobacco: Not on file  . Alcohol Use: 16.8 oz/week    28 Cans of beer per week    Review of Systems  Constitutional: Negative for fever and chills.  Musculoskeletal: Positive for myalgias. Negative for joint swelling.  Skin: Negative for color change, pallor and rash.    Allergies  Review of patient's allergies indicates no known allergies.  Home Medications   Current Outpatient Rx  Name  Route  Sig  Dispense  Refill  . albuterol (PROVENTIL HFA;VENTOLIN HFA) 108 (90 BASE) MCG/ACT inhaler   Inhalation   Inhale 2 puffs into the lungs every 6 (six) hours as needed. For shortness of breath          BP 133/76  Pulse 64  Temp(Src)  98 F (36.7 C) (Oral)  Resp 20  SpO2 100% Physical Exam  Constitutional: He is oriented to person, place, and time. He appears well-developed and well-nourished. No distress.  HENT:  Head: Normocephalic and atraumatic.  Right Ear: External ear normal.  Left Ear: External ear normal.  Nose: Nose normal.  Eyes: Conjunctivae are normal.  Neck: Neck supple.  Pulmonary/Chest: Effort normal.  Musculoskeletal:       Left hand: He exhibits normal range of motion, no bony tenderness, normal two-point discrimination, normal capillary refill, no deformity and no swelling. Normal sensation noted. Normal strength noted.       Hands: Mild tenderness to area noted.  Neurological: He is alert and oriented to person, place, and time.  Skin: Skin is warm and dry. He is not diaphoretic.  Well-healed 2.5 cm laceration to left hand.  Psychiatric: He has a normal mood and affect.    ED Course  Procedures (including critical care time)  SUTURE REMOVAL Performed by: Francee Piccolo L  Consent: Verbal consent obtained. Consent given by: patient Required items: required blood products, implants, devices, and special equipment available Time out: Immediately prior to procedure a "time out" was called to verify the correct patient, procedure, equipment, support staff and site/side marked as required.  Location: L head  Wound Appearance: clean  Sutures/Staples Removed: 5  Patient tolerance: Patient tolerated the procedure well with no immediate complications.     Labs  Review Labs Reviewed - No data to display Imaging Review No results found.  MDM   1. Visit for suture removal    Staple removal   Pt to ER for staple/suture removal and wound check as above. Procedure tolerated well. Vitals normal, no signs of infection. Scar minimization & return precautions given at dc. Patient is stable at time of discharge      Jeannetta Ellis, PA-C 06/05/13 1441

## 2013-06-05 NOTE — ED Provider Notes (Signed)
Medical screening examination/treatment/procedure(s) were performed by non-physician practitioner and as supervising physician I was immediately available for consultation/collaboration.   Jamae Tison, MD 06/05/13 2252 

## 2013-06-05 NOTE — ED Notes (Signed)
Pt is here for suture removal to the L Hand.

## 2014-02-06 ENCOUNTER — Encounter (HOSPITAL_COMMUNITY): Payer: Self-pay | Admitting: Emergency Medicine

## 2014-02-06 ENCOUNTER — Emergency Department (HOSPITAL_COMMUNITY)
Admission: EM | Admit: 2014-02-06 | Discharge: 2014-02-06 | Disposition: A | Discharge status: Home / Self Care | Payer: Medicare HMO | Attending: Emergency Medicine | Admitting: Emergency Medicine

## 2014-02-06 ENCOUNTER — Emergency Department (HOSPITAL_COMMUNITY): Payer: Medicare HMO

## 2014-02-06 DIAGNOSIS — S99922A Unspecified injury of left foot, initial encounter: Secondary | ICD-10-CM

## 2014-02-06 DIAGNOSIS — Y929 Unspecified place or not applicable: Secondary | ICD-10-CM | POA: Insufficient documentation

## 2014-02-06 DIAGNOSIS — S8990XA Unspecified injury of unspecified lower leg, initial encounter: Secondary | ICD-10-CM | POA: Insufficient documentation

## 2014-02-06 DIAGNOSIS — F17200 Nicotine dependence, unspecified, uncomplicated: Secondary | ICD-10-CM | POA: Insufficient documentation

## 2014-02-06 DIAGNOSIS — I10 Essential (primary) hypertension: Secondary | ICD-10-CM | POA: Insufficient documentation

## 2014-02-06 DIAGNOSIS — Z8719 Personal history of other diseases of the digestive system: Secondary | ICD-10-CM | POA: Insufficient documentation

## 2014-02-06 DIAGNOSIS — Y9389 Activity, other specified: Secondary | ICD-10-CM | POA: Insufficient documentation

## 2014-02-06 DIAGNOSIS — Z23 Encounter for immunization: Secondary | ICD-10-CM | POA: Insufficient documentation

## 2014-02-06 DIAGNOSIS — S99919A Unspecified injury of unspecified ankle, initial encounter: Secondary | ICD-10-CM | POA: Insufficient documentation

## 2014-02-06 DIAGNOSIS — S99929A Unspecified injury of unspecified foot, initial encounter: Principal | ICD-10-CM

## 2014-02-06 DIAGNOSIS — S61209A Unspecified open wound of unspecified finger without damage to nail, initial encounter: Secondary | ICD-10-CM | POA: Insufficient documentation

## 2014-02-06 DIAGNOSIS — S61011A Laceration without foreign body of right thumb without damage to nail, initial encounter: Secondary | ICD-10-CM

## 2014-02-06 DIAGNOSIS — W268XXA Contact with other sharp object(s), not elsewhere classified, initial encounter: Secondary | ICD-10-CM | POA: Insufficient documentation

## 2014-02-06 DIAGNOSIS — W208XXA Other cause of strike by thrown, projected or falling object, initial encounter: Secondary | ICD-10-CM | POA: Insufficient documentation

## 2014-02-06 MED ORDER — CEPHALEXIN 500 MG PO CAPS: 500.0000 mg | ORAL_CAPSULE | Freq: Four times a day (QID) | ORAL | Status: DC

## 2014-02-06 MED ORDER — TETANUS-DIPHTH-ACELL PERTUSSIS 5-2.5-18.5 LF-MCG/0.5 IM SUSP
0.5000 mL | Freq: Once | INTRAMUSCULAR | Status: AC
  Administered 2014-02-06: 0.5 mL via INTRAMUSCULAR
  Filled 2014-02-06: qty 0.5

## 2014-02-06 MED ORDER — OXYCODONE-ACETAMINOPHEN 5-325 MG/5ML PO SOLN: 5.0000 mL | ORAL | Status: DC | PRN

## 2014-02-06 NOTE — ED Notes (Signed)
Pt back to ED, PA in to speak with pt to discuss discharge instructions.

## 2014-02-06 NOTE — Discharge Instructions (Signed)
Your finger injury has potential to become infected.  Please clean wound daily and apply neosporin over it with clean dressing daily.  Take pain medication as needed.  If you notice worsening pain or infection then follow up with hand specialist for further care.  Your xray of both finger and left foot is without any evidence of bony fracture.    Delayed Wound Closure Sometimes, your health care provider will decide to delay closing a wound for several days. This is done when the wound is badly bruised, dirty, or when it has been several hours since the injury happened. By delaying the closure of your wound, the risk of infection is reduced. Wounds that are closed in 3 7 days after being cleaned up and dressed heal just as well as those that are closed right away. HOME CARE INSTRUCTIONS  Rest and elevate the injured area until the pain and swelling are gone.  Have your wound checked as instructed by your health care provider. SEEK MEDICAL CARE IF:  You develop unusual or increased swelling or redness around the wound.  You have increasing pain or tenderness.  There is increasing fluid (drainage) or a bad smelling drainage coming from the wound. Document Released: 09/17/2005 Document Revised: 05/20/2013 Document Reviewed: 03/17/2013 [facility] Patient Information 2014 [place], [place].  RICE: Routine Care for Injuries The routine care of many injuries includes Rest, Ice, Compression, and Elevation (RICE). HOME CARE INSTRUCTIONS  Rest is needed to allow your body to heal. Routine activities can usually be resumed when comfortable. Injured tendons and bones can take up to 6 weeks to heal. Tendons are the cord-like structures that attach muscle to bone.  Ice following an injury helps keep the swelling down and reduces pain.  Put ice in a plastic bag.  Place a towel between your skin and the bag.  Leave the ice on for 15-20 minutes, 03-04 times a day. Do this while awake, for the first 24 to  48 hours. After that, continue as directed by your caregiver.  Compression helps keep swelling down. It also gives support and helps with discomfort. If an elastic bandage has been applied, it should be removed and reapplied every 3 to 4 hours. It should not be applied tightly, but firmly enough to keep swelling down. Watch fingers or toes for swelling, bluish discoloration, coldness, numbness, or excessive pain. If any of these problems occur, remove the bandage and reapply loosely. Contact your caregiver if these problems continue.  Elevation helps reduce swelling and decreases pain. With extremities, such as the arms, hands, legs, and feet, the injured area should be placed near or above the level of the heart, if possible. SEEK IMMEDIATE MEDICAL CARE IF:  You have persistent pain and swelling.  You develop redness, numbness, or unexpected weakness.  Your symptoms are getting worse rather than improving after several days. These symptoms may indicate that further evaluation or further X-rays are needed. Sometimes, X-rays may not show a small broken bone (fracture) until 1 week or 10 days later. Make a follow-up appointment with your caregiver. Ask when your X-ray results will be ready. Make sure you get your X-ray results. Document Released: 12/30/2000 Document Revised: 12/10/2011 Document Reviewed: 02/16/2011 [facility] Patient Information 2014 [place], [place].

## 2014-02-06 NOTE — ED Notes (Signed)
Dropped TV on left foot.  Lump on left great toe.  Lac to right thumb, very painful.  Edges not approximated.  No bleeding.

## 2014-02-06 NOTE — ED Notes (Signed)
Pt discharged by mistake, called pt to come back to ED.

## 2014-02-06 NOTE — ED Notes (Signed)
Patient transported to X-ray 

## 2014-02-06 NOTE — ED Provider Notes (Signed)
CSN: [id number]     Arrival date & time 02/06/14  1521 History  This chart was scribed for non-physician practitioner, Fayrene HelperBowie Bryauna Byrum, PA-C working with Dagmar HaitWilliam Blair Walden, MD by Greggory StallionKayla Andersen, ED scribe. This patient was seen in room TR09C/TR09C and the patient's care was started at 4:21 PM.   Chief Complaint  Patient presents with  . Foot Pain  . Extremity Laceration   The history is provided by the patient. No language interpreter was used.   HPI Comments: [name] is a 63 y.o. male who presents to the Emergency Department complaining of a laceration to his right thumb that occurred 2 days ago. Pt dropping glass in the trash can, tried to get it and accidentally cut himself. He has pain and swelling around the area. Pressure worsens the pain. Pt has put peroxide on the wound with little relief. Pt is also complaining of left great toe pain that started yesterday after dropping a TV on it. He has taken tylenol with little relief. Pt is unsure of when his last tetanus was. Pt is right hand dominant.   Past Medical History  Diagnosis Date  . GERD (gastroesophageal reflux disease)   . Hypertension   . Mass     back of head   Past Surgical History  Procedure Laterality Date  . Knee surgery      left knee   No family history on file. History  Substance Use Topics  . Smoking status: Current Every Day Smoker -- 0.25 packs/day  . Smokeless tobacco: Not on file  . Alcohol Use: 16.8 oz/week    28 Cans of beer per week    Review of Systems  Musculoskeletal: Positive for arthralgias and joint swelling.  Skin: Positive for wound.  All other systems reviewed and are negative.  Allergies  Review of patient's allergies indicates no known allergies.  Home Medications   Prior to Admission medications   Medication Sig Start Date End Date Taking? Authorizing Provider  albuterol (PROVENTIL HFA;VENTOLIN HFA) 108 (90 BASE) MCG/ACT inhaler Inhale 2 puffs into the lungs every 6 (six) hours  as needed. For shortness of breath    Historical Provider, MD   BP 111/59  Pulse 60  Temp(Src) 97.7 F (36.5 C) (Oral)  Resp 14  Ht 6' (1.829 m)  Wt 177 lb (80.287 kg)  BMI 24.00 kg/m2  SpO2 99%  Physical Exam  Nursing note and vitals reviewed. Constitutional: He is oriented to person, place, and time. He appears well-developed and well-nourished. No distress.  HENT:  Head: Normocephalic and atraumatic.  Eyes: EOM are normal.  Neck: Neck supple. No tracheal deviation present.  Cardiovascular: Normal rate.   Pulmonary/Chest: Effort normal. No respiratory distress.  Musculoskeletal: Normal range of motion.  Right thumb with 2 cm superficial laceration noted to medial aspect on extensor surface. No active bleeding. Surrounding edema and moderate tenderness to palpation. Sensation intact distally. Normal flexion and extension at PIP. Normal flexion and extension at MCP. No foreign object noted. Left foot with mild tenderness noted to MCP of great toe without any deformity, crepitus, edema or laceration noted.   Neurological: He is alert and oriented to person, place, and time.  Skin: Skin is warm and dry.  Psychiatric: He has a normal mood and affect. His behavior is normal.    ED Course  Procedures (including critical care time)  DIAGNOSTIC STUDIES: Oxygen Saturation is 99% on RA, normal by my interpretation.    COORDINATION OF CARE: 4:26 PM-Discussed  treatment plan which includes hand xray and an antibiotic with pt at bedside and pt agreed to plan.   1:39 AM Xrays demonstrates no acute fx or dislocation.  Since finger lac is not amendable for sutures given the delay treatment, will provide abx to prevent infection.  Wound care discussed.  Strict return precaution given.  Pain medication provided.  Pt made aware likelihood of infection to his finger if not provide adequate care.  Pt understand.    To be noted: pt was given the wrong discharge paper from another patient by the  nurse.  Pt left, i had our nurse to call patient back and provide patient with the appropriate discharge paper as well as medications.   Labs Review Labs Reviewed - No data to display  Imaging Review Dg Finger Thumb Right  02/06/2014   CLINICAL DATA:  Laceration at anterior right thumb.  EXAM: RIGHT THUMB 2+V  COMPARISON:  DG HAND COMPLETE*R* dated 06/09/2012  FINDINGS: No radiopaque foreign body. No fracture. 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 Soft tissue swelling is present along the right thumb.  IMPRESSION: No acute osseous abnormality.   Electronically Signed   By: Andreas Newport M.D.   On: 02/06/2014 17:18   Dg Foot Complete Left  02/06/2014   CLINICAL DATA:  Left foot pain.  Left foot trauma.  EXAM: LEFT FOOT - COMPLETE 3+ VIEW  COMPARISON:  None.  FINDINGS: Moderate first MTP joint osteoarthritis no radiopaque foreign body or fracture. The alignment of the bones of the foot is anatomic. Marginal erosion is present at the first MTP joint, suggesting gout arthritis. Correlate with serum uric acid. There is soft tissue swelling over the lateral aspect of the fifth MTP joint which may be posttraumatic. This could also be associated with gout arthropathy in the appropriate setting.  IMPRESSION: 1. No acute traumatic injury. 2. 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 3. Soft tissue swelling lateral to the fifth MTP joint.   Electronically Signed   By: Andreas Newport M.D.   On: 02/06/2014 17:23     EKG Interpretation None      MDM   Final diagnoses:  Laceration of right thumb with delay in treatment  Injury of left foot    BP 132/83  Pulse 64  Temp(Src) 97.7 F (36.5 C) (Oral)  Resp 18  Ht 6' (1.829 m)  Wt 177 lb (80.287 kg)  BMI 24.00 kg/m2  SpO2 100%  I have reviewed nursing notes and vital signs. I personally reviewed the imaging tests through PACS system  I reviewed available ER/hospitalization records  thought the EMR   I personally performed the services described in this documentation, which was scribed in my presence. The recorded information has been reviewed and is accurate.  Fayrene Helper, PA-C 02/07/14 [phone]

## 2014-02-06 NOTE — ED Notes (Signed)
Pt states he dropped a tv on his L foot on Thursday.  Pt now c/o a lump on his great toe.  Pt also c/o laceration to L thumb that also occurred on Thursday.  Pt ambulatory.

## 2014-02-07 NOTE — ED Provider Notes (Signed)
Medical screening examination/treatment/procedure(s) were performed by non-physician practitioner and as supervising physician I was immediately available for consultation/collaboration.   EKG Interpretation None        William Kam Rahimi, MD 02/07/14 1508 

## 2014-04-10 ENCOUNTER — Encounter (HOSPITAL_COMMUNITY): Payer: Self-pay | Admitting: Emergency Medicine

## 2014-04-10 ENCOUNTER — Emergency Department (HOSPITAL_COMMUNITY)
Admission: EM | Admit: 2014-04-10 | Discharge: 2014-04-10 | Disposition: A | Discharge status: Home / Self Care | Payer: Medicare HMO | Attending: Emergency Medicine | Admitting: Emergency Medicine

## 2014-04-10 DIAGNOSIS — I10 Essential (primary) hypertension: Secondary | ICD-10-CM | POA: Insufficient documentation

## 2014-04-10 DIAGNOSIS — G8929 Other chronic pain: Secondary | ICD-10-CM

## 2014-04-10 DIAGNOSIS — Z8719 Personal history of other diseases of the digestive system: Secondary | ICD-10-CM | POA: Insufficient documentation

## 2014-04-10 DIAGNOSIS — F17200 Nicotine dependence, unspecified, uncomplicated: Secondary | ICD-10-CM | POA: Insufficient documentation

## 2014-04-10 DIAGNOSIS — Z792 Long term (current) use of antibiotics: Secondary | ICD-10-CM | POA: Insufficient documentation

## 2014-04-10 DIAGNOSIS — M25549 Pain in joints of unspecified hand: Secondary | ICD-10-CM | POA: Insufficient documentation

## 2014-04-10 DIAGNOSIS — M79644 Pain in right finger(s): Secondary | ICD-10-CM

## 2014-04-10 MED ORDER — HYDROCODONE-ACETAMINOPHEN 5-325 MG PO TABS: 1.0000 | ORAL_TABLET | ORAL | Status: DC | PRN

## 2014-04-10 NOTE — Discharge Instructions (Signed)
Read the information below.  Use the prescribed medication as directed.  Please discuss all new medications with your pharmacist.  Do not take additional tylenol while taking the prescribed pain medication to avoid overdose.  You may return to the Emergency Department at any time for worsening condition or any new symptoms that concern you.   If you develop uncontrolled pain, weakness or numbness of the extremity, severe discoloration of the skin, or you are unable to move your thumb, return to the ER for a recheck.       Chronic Pain Chronic pain can be defined as pain that is off and on and lasts for 3-6 months or longer. Many things cause chronic pain, which can make it difficult to make a diagnosis. There are many treatment options available for chronic pain. However, finding a treatment that works well for you may require trying various approaches until the right one is found. Many people benefit from a combination of two or more types of treatment to control their pain. SYMPTOMS  Chronic pain can occur anywhere in the body and can range from mild to very severe. Some types of chronic pain include:  Headache.  Low back pain.  Cancer pain.  Arthritis pain.  Neurogenic pain. This is pain resulting from damage to nerves. People with chronic pain may also have other symptoms such as:  Depression.  Anger.  Insomnia.  Anxiety. DIAGNOSIS  Your health care provider will help diagnose your condition over time. In many cases, the initial focus will be on excluding possible conditions that could be causing the pain. Depending on your symptoms, your health care provider may order tests to diagnose your condition. Some of these tests may include:   Blood tests.   CT scan.   MRI.   X-rays.   Ultrasounds.   Nerve conduction studies.  You may need to see a specialist.  TREATMENT  Finding treatment that works well may take time. You may be referred to a pain specialist. He or  she may prescribe medicine or therapies, such as:   Mindful meditation or yoga.  Shots (injections) of numbing or pain-relieving medicines into the spine or area of pain.  Local electrical stimulation.  Acupuncture.   Massage therapy.   Aroma, color, light, or sound therapy.   Biofeedback.   Working with a physical therapist to keep from getting stiff.   Regular, gentle exercise.   Cognitive or behavioral therapy.   Group support.  Sometimes, surgery may be recommended.  HOME CARE INSTRUCTIONS   Take all medicines as directed by your health care provider.   Lessen stress in your life by relaxing and doing things such as listening to calming music.   Exercise or be active as directed by your health care provider.   Eat a healthy diet and include things such as vegetables, fruits, fish, and lean meats in your diet.   Keep all follow-up appointments with your health care provider.   Attend a support group with others suffering from chronic pain. SEEK MEDICAL CARE IF:   Your pain gets worse.   You develop a new pain that was not there before.   You cannot tolerate medicines given to you by your health care provider.   You have new symptoms since your last visit with your health care provider.  SEEK IMMEDIATE MEDICAL CARE IF:   You feel weak.   You have decreased sensation or numbness.   You lose control of bowel or bladder function.  Your pain suddenly gets much worse.   You develop shaking.  You develop chills.  You develop confusion.  You develop chest pain.  You develop shortness of breath.  MAKE SURE YOU:  Understand these instructions.  Will watch your condition.  Will get help right away if you are not doing well or get worse. Document Released: 06/09/2002 Document Revised: 05/20/2013 Document Reviewed: 03/13/2013 [facility] Patient Information 2015 [place]. This information is not intended to replace advice given  to you by your health care provider. Make sure you discuss any questions you have with your health care provider.  Neuropathic Pain We often think that pain has a physical cause. If we get rid of the cause, the pain should go away. Nerves themselves can also cause pain. It is called neuropathic pain, which means nerve abnormality. It may be difficult for the patients who have it and for the treating caregivers. Pain is usually described as acute (short-lived) or chronic (long-lasting). Acute pain is related to the physical sensations caused by an injury. It can last from a few seconds to many weeks, but it usually goes away when normal healing occurs. Chronic pain lasts beyond the typical healing time. With neuropathic pain, the nerve fibers themselves may be damaged or injured. They then send incorrect signals to other pain centers. The pain you feel is real, but the cause is not easy to find.  CAUSES  Chronic pain can result from diseases, such as diabetes and shingles (an infection related to chickenpox), or from trauma, surgery, or amputation. It can also happen without any known injury or disease. The nerves are sending pain messages, even though there is no identifiable cause for such messages.   Other common causes of neuropathy include diabetes, phantom limb pain, or Regional Pain Syndrome (RPS).  As with all forms of chronic back pain, if neuropathy is not correctly treated, there can be a number of associated problems that lead to a downward cycle for the patient. These include depression, sleeplessness, feelings of fear and anxiety, limited social interaction and inability to do normal daily activities or work.  The most dramatic and mysterious example of neuropathic pain is called "phantom limb syndrome." This occurs when an arm or a leg has been removed because of illness or injury. The brain still gets pain messages from the nerves that originally carried impulses from the missing limb.  These nerves now seem to misfire and cause troubling pain.  Neuropathic pain often seems to have no cause. It responds poorly to standard pain treatment. Neuropathic pain can occur after:  Shingles (herpes zoster virus infection).  A lasting burning sensation of the skin, caused usually by injury to a peripheral nerve.  Peripheral neuropathy which is widespread nerve damage, often caused by diabetes or alcoholism.  Phantom limb pain following an amputation.  Facial nerve problems (trigeminal neuralgia).  Multiple sclerosis.  Reflex sympathetic dystrophy.  Pain which comes with cancer and cancer chemotherapy.  Entrapment neuropathy such as when pressure is put on a nerve such as in carpal tunnel syndrome.  Back, leg, and hip problems (sciatica).  Spine or back surgery.  HIV Infection or AIDS where nerves are infected by viruses. Your caregiver can explain items in the above list which may apply to you. SYMPTOMS  Characteristics of neuropathic pain are:  Severe, sharp, electric shock-like, shooting, lightening-like, knife-like.  Pins and needles sensation.  Deep burning, deep cold, or deep ache.  Persistent numbness, tingling, or weakness.  Pain resulting from  light touch or other stimulus that would not usually cause pain.  Increased sensitivity to something that would normally cause pain, such as a pinprick. Pain may persist for months or years following the healing of damaged tissues. When this happens, pain signals no longer sound an alarm about current injuries or injuries about to happen. Instead, the alarm system itself is not working correctly.  Neuropathic pain may get worse instead of better over time. For some people, it can lead to serious disability. It is important to be aware that severe injury in a limb can occur without a proper, protective pain response.Burns, cuts, and other injuries may go unnoticed. Without proper treatment, these injuries can become  infected or lead to further disability. Take any injury seriously, and consult your caregiver for treatment. DIAGNOSIS  When you have a pain with no known cause, your caregiver will probably ask some specific questions:   Do you have any other conditions, such as diabetes, shingles, multiple sclerosis, or HIV infection?  How would you describe your pain? (Neuropathic pain is often described as shooting, stabbing, burning, or searing.)  Is your pain worse at any time of the day? (Neuropathic pain is usually worse at night.)  Does the pain seem to follow a certain physical pathway?  Does the pain come from an area that has missing or injured nerves? (An example would be phantom limb pain.)  Is the pain triggered by minor things such as rubbing against the sheets at night? These questions often help define the type of pain involved. Once your caregiver knows what is happening, treatment can begin. Anticonvulsant, antidepressant drugs, and various pain relievers seem to work in some cases. If another condition, such as diabetes is involved, better management of that disorder may relieve the neuropathic pain.  TREATMENT  Neuropathic pain is frequently long-lasting and tends not to respond to treatment with narcotic type pain medication. It may respond well to other drugs such as antiseizure and antidepressant medications. Usually, neuropathic problems do not completely go away, but partial improvement is often possible with proper treatment. Your caregivers have large numbers of medications available to treat you. Do not be discouraged if you do not get immediate relief. Sometimes different medications or a combination of medications will be tried before you receive the results you are hoping for. See your caregiver if you have pain that seems to be coming from nowhere and does not go away. Help is available.  SEEK IMMEDIATE MEDICAL CARE IF:   There is a sudden change in the quality of your pain,  especially if the change is on only one side of the body.  You notice changes of the skin, such as redness, black or purple discoloration, swelling, or an ulcer.  You cannot move the affected limbs. Document Released: 06/14/2004 Document Revised: 12/10/2011 Document Reviewed: 06/14/2004 [facility] Patient Information 2015 [address], [place]. This information is not intended to replace advice given to you by your health care provider. Make sure you discuss any questions you have with your health care provider.

## 2014-04-10 NOTE — ED Notes (Signed)
Declined W/C at D/C and was escorted to lobby by RN. 

## 2014-04-10 NOTE — ED Provider Notes (Signed)
CSN: [id number]     Arrival date & time 04/10/14  1543 History  This chart was scribed for non-physician practitioner working with [name], MD by Elveria Rising, ED Scribe. This patient was seen in room TR05C/TR05C and the patient's care was started at 4:36 PM.   Chief Complaint  Patient presents with  . Finger Injury      The history is provided by the patient. No language interpreter was used.   HPI Comments: [name] is a 63 y.o. male who presents to the Emergency Department with a right thumb laceration that occurred two month ago. Patient cut his right thumb with a piece of glass while reaching into a trashcan. Patient reports that he was wearing gloves at the time of the injury, but the glass cut through. Patient reports that he did not have it evaluated immediately and two days later the laceration could not be closed. Patient reports pain and and irritation with bending and numbness. Xray at the time of his original visit was negative for fracture and FB.  Denies new injury.  Patient denies any form of treatment, but shares that he drinks two 40s a day to treat his pain. Patient is right hand dominant.   Patient denies new injury to hand and denies any other medical complaints.    Past Medical History  Diagnosis Date  . GERD (gastroesophageal reflux disease)   . Hypertension   . Mass     back of head   Past Surgical History  Procedure Laterality Date  . Knee surgery      left knee   No family history on file. History  Substance Use Topics  . Smoking status: Current Every Day Smoker -- 0.25 packs/day  . Smokeless tobacco: Not on file  . Alcohol Use: 16.8 oz/week    28 Cans of beer per week    Review of Systems  Constitutional: Negative for fever.  Musculoskeletal: Positive for arthralgias.  Skin:       Healed laceration  All other systems reviewed and are negative.     Allergies  Review of patient's allergies indicates no known allergies.  Home  Medications   Prior to Admission medications   Medication Sig Start Date End Date Taking? Authorizing Provider  albuterol (PROVENTIL HFA;VENTOLIN HFA) 108 (90 BASE) MCG/ACT inhaler Inhale 2 puffs into the lungs every 6 (six) hours as needed. For shortness of breath    Historical Provider, MD  cephALEXin (KEFLEX) 500 MG capsule Take 1 capsule (500 mg total) by mouth 4 (four) times daily. 02/06/14   Fayrene Helper, PA-C  oxyCODONE-acetaminophen (ROXICET) 5-325 MG/5ML solution Take 5 mLs by mouth every 4 (four) hours as needed for severe pain. 02/06/14   Fayrene Helper, PA-C   Triage Vitals: BP 121/86  Pulse 68  Temp(Src) 97.5 F (36.4 C) (Oral)  Resp 18  SpO2 97% Physical Exam  Nursing note and vitals reviewed. Constitutional: He appears well-developed and well-nourished. No distress.  HENT:  Head: Normocephalic and atraumatic.  Neck: Neck supple.  Pulmonary/Chest: Effort normal.  Musculoskeletal:       Hands: Right thumb distal sensation intact, capillary refill < 2 seconds.  Full AROM of motion.  Does have pain at interphalangeal joint with flexion.    Neurological: He is alert.  Skin: He is not diaphoretic.    ED Course  Procedures (including critical care time) COORDINATION OF CARE: 4:45 PM- Will prescribe pain medication given the patient uses it appropriately and will refer to  hand specialist. Discussed treatment plan with patient at bedside and patient agreed to plan.   Labs Review Labs Reviewed - No data to display  Imaging Review No results found.   EKG Interpretation None      MDM   Final diagnoses:  Chronic thumb pain, right    Pt with right thumb laceration two months ago, not sutured due to delayed presentation.  No FB or fracture on xray at the time.  Note reviewed.  Pt has pain over area of scar with flexion of joint. This is likely due to scar tissue or possibly local nerve damage.  Pt given hand surgery follow up, prescribed pain medication.  Advised to stop  drinking ETOH for pain control.  Discussed result, findings, treatment, and follow up  with patient.  Pt given return precautions.  Pt verbalizes understanding and agrees with plan.      I personally performed the services described in this documentation, which was scribed in my presence. The recorded information has been reviewed and is accurate.    Trixie Dredgemily Jimmy Stipes, PA-C 04/10/14 1751

## 2014-04-10 NOTE — ED Notes (Signed)
Pt had cut to right thumb about one month ago and now with numbness to thumb above the cut.  Pt wants to know why it is numb.

## 2014-04-10 NOTE — ED Provider Notes (Signed)
Medical screening examination/treatment/procedure(s) were performed by non-physician practitioner and as supervising physician I was immediately available for consultation/collaboration.   EKG Interpretation None        [name], MD 04/10/14 2259

## 2014-11-13 ENCOUNTER — Emergency Department (HOSPITAL_COMMUNITY)
Admission: EM | Admit: 2014-11-13 | Discharge: 2014-11-13 | Disposition: A | Discharge status: Home / Self Care | Payer: Medicare HMO | Attending: Emergency Medicine | Admitting: Emergency Medicine

## 2014-11-13 ENCOUNTER — Encounter (HOSPITAL_COMMUNITY): Payer: Self-pay | Admitting: Nurse Practitioner

## 2014-11-13 ENCOUNTER — Emergency Department (HOSPITAL_COMMUNITY): Payer: Medicare HMO

## 2014-11-13 DIAGNOSIS — Z79899 Other long term (current) drug therapy: Secondary | ICD-10-CM | POA: Insufficient documentation

## 2014-11-13 DIAGNOSIS — M79672 Pain in left foot: Secondary | ICD-10-CM | POA: Diagnosis present

## 2014-11-13 DIAGNOSIS — Z8719 Personal history of other diseases of the digestive system: Secondary | ICD-10-CM | POA: Insufficient documentation

## 2014-11-13 DIAGNOSIS — M2012 Hallux valgus (acquired), left foot: Secondary | ICD-10-CM | POA: Diagnosis not present

## 2014-11-13 DIAGNOSIS — I10 Essential (primary) hypertension: Secondary | ICD-10-CM | POA: Insufficient documentation

## 2014-11-13 DIAGNOSIS — Z720 Tobacco use: Secondary | ICD-10-CM | POA: Diagnosis not present

## 2014-11-13 DIAGNOSIS — M21612 Bunion of left foot: Secondary | ICD-10-CM

## 2014-11-13 MED ORDER — NAPROXEN 250 MG PO TABS: 250.0000 mg | ORAL_TABLET | Freq: Two times a day (BID) | ORAL | Status: DC

## 2014-11-13 NOTE — ED Notes (Signed)
He c/o L foot pain and swelling over past months. He states he injured this foot many years ago but has not had pain since. He took 4 otc ibuprofen pills last night and this morning with no relief of pain

## 2014-11-13 NOTE — ED Notes (Signed)
Declined W/C at D/C and was escorted to lobby by RN. 

## 2014-11-13 NOTE — Discharge Instructions (Signed)
Bunion You have a bunion deformity of the feet. This is more common in women. It tends to be an inherited problem. Symptoms can include pain, swelling, and deformity around the great toe. Numbness and tingling may also be present. Your symptoms are often worsened by wearing shoes that cause pressure on the bunion. Changing the type of shoes you wear helps reduce symptoms. A wide shoe decreases pressure on the bunion. An arch support may be used if you have flat feet. Avoid shoes with heels higher than two inches. This puts more pressure on the bunion. X-rays may be helpful in evaluating the severity of the problem. Other foot problems often seen with bunions include corns, calluses, and hammer toes. If the deformity or pain is severe, surgical treatment may be necessary. Keep off your painful foot as much as possible until the pain is relieved. Call your caregiver if your symptoms are worse.  SEEK IMMEDIATE MEDICAL CARE IF:  You have increased redness, pain, swelling, or other symptoms of infection. Document Released: 09/17/2005 Document Revised: 12/10/2011 Document Reviewed: 03/17/2007 [employer] Patient Information 2015 [employer]. This information is not intended to replace advice given to you by your health care provider. Make sure you discuss any questions you have with your health care provider.       Bunion Care A bunion is a boney protrusion at the base of your big toe (metatarsal-phalangeal joint). This problem, if painful or troublesome can be corrected with surgery. This is an elective surgery, so you can pick a convenient time for the procedure. The surgery may:  Improve appearance (cosmetic).  Relieve pain.  Improve function. Your foot is made up of a complex set of twenty-six bones which are held together by tough fibrous ligaments. The movement of the foot is controlled by muscles in the foot and leg. These muscles attach to the foot by cord like structures (tendons) that  attach muscle to bone. If surgery is recommended, your caregiver will explain your foot problem and how surgery can improve it. Your caregiver can answer questions you may have about the potential risks and complications involved. After determining that foot surgery is necessary to correct your problem, you can proceed with plans for the surgery. LET YOUR CAREGIVER KNOW ABOUT:   Previous problems with anesthetics or medicines used to numb the skin.  Allergies to dyes, iodine, foods, and/or latex.  Medicines taken including herbs, eye drops, prescription medicines (especially medicines used to "thin the blood"), aspirin and other over-the-counter medicines, and steroids (by mouth or as a cream).  History of bleeding or blood problems.  Possibility of pregnancy, if this applies.  History of blood clots in your legs and/or lungs.  Previous surgery.  Other important health problems. Let your caregiver know about health changes prior to surgery. BEFORE THE PROCEDURE  You should be present 60 minutes prior to your procedure or as directed.  PROCEDURE BUNION TYPES AND THEIR TREATMENTS  Positional Bunion. A positional bunion develops when a bony growth on the side of the metatarsal bone enlarges the joint. The metatarsal forces the joint capsule to stretch over it. As this growth pushes the big toe toward the others, the tendons on the inside tighten. This forces the big toe farther out of alignment. The bunion presses against the shoe, irritating the skin and causes further pain and disability.  Positional Bunionectomy Treatment. The bunion is removed. Tight tendons may be released.  Follow-up Care. Your toe is apt to be stiff at first but   will loosen up as you move it. You may need to wear a special surgical shoe and, possibly, a splint for about three weeks.  Mild Structural Bunion. Structural bunions occur when the angle between the first and second metatarsal bones increases to a point  where it is greater than normal. The increased angle of the metatarsals makes the big toe slant toward the other toes. Sometimes bony growths may form. Irritation and swelling often follow.  Structural Bunionectomy Treatment. Your caregiver surgically repositions the bone by decreasing the angle and may use a fixation device to hold it together. The bunion (bump) is also removed.  Degenerative Joint Disease (Arthritis). When wear-and-tear arthritis (osteoarthritis) of aging affects the big toe joint, pain and reduced joint motion may result. This is not a true bunion but may be associated with bunions. Left untreated, it can increase wear and tear in the joint and break down the cartilage. Pain and stiffness are problems of both wear-and-tear arthritis and rheumatoid arthritis.  Arthroplasty With Joint Implantation As Treatment. The bunion is first removed; then the degenerated joint is removed and replaced with an implant. AFTER THE PROCEDURE  After surgery your bone will heal in phases. A callous (new bone formation) forms, bridging the damaged bone allowing it to heal. In about 6 months, the bone is back to normal strength along with a return of nearly normal function. It is best to do elective surgeries when your health is optimal.  After surgery, you will be taken to the recovery area where a nurse will watch and check your progress. Once you are awake, stable, and taking fluids well, barring other problems you will be allowed to go home. HOME CARE INSTRUCTIONS  Be sure to ask your caregiver how long you will be off your feet and home from work. Plan accordingly. There are several types of bunions and varying surgical treatments for each. Common types are explained above. Your surgery may be similar and may include a fixation device (such as a small screw). Your foot and ankle may be immobilized by a cast (from your toes to below your knee). You may be asked not to bear weight on this foot for a few  weeks or until comfortable. Once home, an ice pack applied to your operative site may help with discomfort and keep the swelling down. You may be able to walk a day or two after surgery. Your podiatrist may prescribe a splint or a special shoe to be worn for several weeks. Only take over-the-counter or prescription medicines for pain, discomfort, or fever as directed by your caregiver.  SEEK MEDICAL CARE IF:   There is increased bleeding (more than a small spot) from the surgical site.  You notice redness, swelling, or increasing pain in the surgical site.  Pus is coming from the site.  An unexplained oral temperature above 102 F (38.9 C) develops.  You notice a foul smell coming from the surgical site or dressing. SEEK IMMEDIATE MEDICAL CARE IF:  You develop a rash, have difficulty breathing, or have any allergic problems with medications. Document Released: 09/14/2000 Document Revised: 12/10/2011 Document Reviewed: 09/20/2008 Exit[employer] Patient Information 2015 [employer]. This information is not intended to replace advice given to you by your health care provider. Make sure you discuss any questions you have with your health care provider.   

## 2014-11-13 NOTE — ED Provider Notes (Signed)
CSN: [id number]     Arrival date & time 11/13/14  1549 History  This chart was scribed for Christopher Farrier, PA-C working with Christopher Fossa, MD by Evon Slack, ED Scribe. This patient was seen in room TR06C/TR06C and the patient's care was started at 4:04 PM.      Chief Complaint  Patient presents with  . Foot Pain   Patient is a 64 y.o. male presenting with lower extremity pain. The history is provided by the patient. No language interpreter was used.  Foot Pain   HPI Comments: [name] is a 64 y.o. male who presents to the Emergency Department complaining of left foot pain onset 1 year prior. Pt states that the pain has been worsening over the past month. Pt states that he has associated swelling described as a "knot." Pt states that the pain is worse when bearing weight. Pt states that he has tried ibuprofen with no relief. Pt denies injury or trauma. Pt denies numbness or loss of sensation.   Past Medical History  Diagnosis Date  . GERD (gastroesophageal reflux disease)   . Hypertension   . Mass     back of head   Past Surgical History  Procedure Laterality Date  . Knee surgery      left knee   History reviewed. No pertinent family history. History  Substance Use Topics  . Smoking status: Current Every Day Smoker -- 0.25 packs/day    Types: Cigarettes  . Smokeless tobacco: Not on file  . Alcohol Use: 16.8 oz/week    28 Cans of beer per week    Review of Systems  Musculoskeletal: Positive for joint swelling and arthralgias.  Neurological: Negative for weakness and numbness.    Allergies  Review of patient's allergies indicates no known allergies.  Home Medications   Prior to Admission medications   Medication Sig Start Date End Date Taking? Authorizing Provider  albuterol (PROVENTIL HFA;VENTOLIN HFA) 108 (90 BASE) MCG/ACT inhaler Inhale 2 puffs into the lungs every 6 (six) hours as needed. For shortness of breath    Historical Provider, MD   HYDROcodone-acetaminophen (NORCO/VICODIN) 5-325 MG per tablet Take 1 tablet by mouth every 4 (four) hours as needed for moderate pain or severe pain. 04/10/14   Trixie Dredge, PA-C  naproxen (NAPROSYN) 250 MG tablet Take 1 tablet (250 mg total) by mouth 2 (two) times daily with a meal. 11/13/14   Einar Gip Lurlene Ronda, PA-C   BP 143/95 mmHg  Pulse 68  Temp(Src) 97.7 F (36.5 C) (Oral)  Resp 16  SpO2 100%   Physical Exam  Constitutional: He appears well-developed and well-nourished. No distress.  HENT:  Head: Normocephalic and atraumatic.  Eyes: Right eye exhibits no discharge. Left eye exhibits no discharge.  Pulmonary/Chest: Effort normal. No respiratory distress.  Musculoskeletal: He exhibits no edema.  Bilateral DP and PT pulses intact, sensation to light touch, evidence of bunion on left foot. Tenderness and protrusion over left MP joint. No erythema, edema or deformity noted. There is no warmth over the joint.   Neurological: He is alert. Coordination normal.  Skin: No rash noted. He is not diaphoretic.  Psychiatric: He has a normal mood and affect. His behavior is normal.  Nursing note and vitals reviewed.   ED Course  Procedures (including critical care time) DIAGNOSTIC STUDIES: Oxygen Saturation is 100% on RA, normal by my interpretation.    COORDINATION OF CARE: 4:27 PM-Discussed treatment plan with pt at bedside and pt agreed to plan.  Labs Review Labs Reviewed - No data to display  Imaging Review Dg Foot Complete Left  11/13/2014   CLINICAL DATA:  64 year old male with pain with weight-bearing. Pain at medial at of the first metatarsal. Concern initial  EXAM: LEFT FOOT - COMPLETE 3+ VIEW  COMPARISON:  02/26/2014.  FINDINGS: Bone mineralization is within normal limits. Calcaneus intact. Chronic first MTP joint space loss and degenerative spurring appears stable. There are chronic periarticular erosions as seen previously. Mild medial and lateral soft tissue swelling  at the distal foot. Other osseous structures appear stable.  IMPRESSION: No acute osseous abnormality identified at the left foot. 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   Electronically Signed   By: Odessa Fleming M.D.   On: 11/13/2014 16:43     EKG Interpretation None      Filed Vitals:   11/13/14 1557  BP: 143/95  Pulse: 68  Temp: 97.7 F (36.5 C)  TempSrc: Oral  Resp: 16  SpO2: 100%     MDM   Meds given in ED:  Medications - No data to display  New Prescriptions   NAPROXEN (NAPROSYN) 250 MG TABLET    Take 1 tablet (250 mg total) by mouth 2 (two) times daily with a meal.    Final diagnoses:  Bunion of great toe of left foot    Patient presents with one year of left lateral foot pain that is worse over the past month. He denies trauma or injury. Exam consistent with bunion. Raised area over MP joint that is not warm to touch or erythematous. X-ray dictates no acute osseous abnormality. It does show chronic first MTP arthropathy with marginal erosion raising the possibility of gout. Exam is not consistent with gout today. Education provided on treatment of bunions. I advised the patient to follow-up with their primary care provider this week. I advised the patient to return to the emergency department with new or worsening symptoms or new concerns. The patient verbalized understanding and agreement with plan.    I personally performed the services described in this documentation, which was scribed in my presence. The recorded information has been reviewed and is accurate.        Lawana Chambers, PA-C 11/13/14 1651  Christopher Fossa, MD 11/13/14 1739

## 2014-11-29 ENCOUNTER — Other Ambulatory Visit (HOSPITAL_COMMUNITY): Payer: Self-pay | Admitting: Pulmonary Disease

## 2014-11-29 DIAGNOSIS — N401 Enlarged prostate with lower urinary tract symptoms: Secondary | ICD-10-CM

## 2014-11-29 DIAGNOSIS — R350 Frequency of micturition: Secondary | ICD-10-CM

## 2014-11-29 DIAGNOSIS — N138 Other obstructive and reflux uropathy: Secondary | ICD-10-CM

## 2014-12-03 ENCOUNTER — Ambulatory Visit (HOSPITAL_COMMUNITY)
Admission: RE | Admit: 2014-12-03 | Discharge: 2014-12-03 | Disposition: A | Discharge status: Home / Self Care | Payer: Medicare HMO | Source: Ambulatory Visit | Attending: Pulmonary Disease | Admitting: Pulmonary Disease

## 2014-12-03 DIAGNOSIS — N138 Other obstructive and reflux uropathy: Secondary | ICD-10-CM

## 2014-12-03 DIAGNOSIS — N401 Enlarged prostate with lower urinary tract symptoms: Secondary | ICD-10-CM

## 2014-12-03 DIAGNOSIS — R350 Frequency of micturition: Secondary | ICD-10-CM

## 2015-06-12 ENCOUNTER — Encounter (HOSPITAL_COMMUNITY): Payer: Self-pay | Admitting: Emergency Medicine

## 2015-06-12 ENCOUNTER — Emergency Department (HOSPITAL_COMMUNITY)
Admission: EM | Admit: 2015-06-12 | Discharge: 2015-06-12 | Disposition: A | Discharge status: Home / Self Care | Payer: Medicare HMO | Attending: Emergency Medicine | Admitting: Emergency Medicine

## 2015-06-12 DIAGNOSIS — Z79899 Other long term (current) drug therapy: Secondary | ICD-10-CM | POA: Insufficient documentation

## 2015-06-12 DIAGNOSIS — H5712 Ocular pain, left eye: Secondary | ICD-10-CM | POA: Diagnosis present

## 2015-06-12 DIAGNOSIS — Z8719 Personal history of other diseases of the digestive system: Secondary | ICD-10-CM | POA: Insufficient documentation

## 2015-06-12 DIAGNOSIS — Z720 Tobacco use: Secondary | ICD-10-CM | POA: Insufficient documentation

## 2015-06-12 DIAGNOSIS — H209 Unspecified iridocyclitis: Secondary | ICD-10-CM | POA: Diagnosis not present

## 2015-06-12 DIAGNOSIS — I10 Essential (primary) hypertension: Secondary | ICD-10-CM | POA: Insufficient documentation

## 2015-06-12 DIAGNOSIS — Z23 Encounter for immunization: Secondary | ICD-10-CM | POA: Insufficient documentation

## 2015-06-12 MED ORDER — GATIFLOXACIN 0.5 % OP SOLN
1.0000 [drp] | Freq: Four times a day (QID) | OPHTHALMIC | Status: DC
  Administered 2015-06-12: 1 [drp] via OPHTHALMIC
  Filled 2015-06-12: qty 2.5

## 2015-06-12 MED ORDER — TETRACAINE HCL 0.5 % OP SOLN
2.0000 [drp] | Freq: Once | OPHTHALMIC | Status: AC
  Administered 2015-06-12: 2 [drp] via OPHTHALMIC
  Filled 2015-06-12: qty 2

## 2015-06-12 MED ORDER — PREDNISOLONE ACETATE 1 % OP SUSP
1.0000 [drp] | Freq: Four times a day (QID) | OPHTHALMIC | Status: DC
Start: 2015-06-12 — End: 2015-06-12
  Administered 2015-06-12: 1 [drp] via OPHTHALMIC
  Filled 2015-06-12: qty 1

## 2015-06-12 MED ORDER — TETANUS-DIPHTH-ACELL PERTUSSIS 5-2.5-18.5 LF-MCG/0.5 IM SUSP
0.5000 mL | Freq: Once | INTRAMUSCULAR | Status: AC
  Administered 2015-06-12: 0.5 mL via INTRAMUSCULAR
  Filled 2015-06-12: qty 0.5

## 2015-06-12 MED ORDER — FLUORESCEIN SODIUM 1 MG OP STRP
1.0000 | ORAL_STRIP | Freq: Once | OPHTHALMIC | Status: AC
  Administered 2015-06-12: 1 via OPHTHALMIC
  Filled 2015-06-12: qty 1

## 2015-06-12 MED ORDER — CYCLOPENTOLATE HCL 1 % OP SOLN
1.0000 [drp] | Freq: Two times a day (BID) | OPHTHALMIC | Status: DC
  Administered 2015-06-12: 1 [drp] via OPHTHALMIC
  Filled 2015-06-12: qty 2

## 2015-06-12 NOTE — ED Notes (Signed)
Yao, MD at bedside.  

## 2015-06-12 NOTE — Discharge Instructions (Signed)
1) use the cyclogyl 2 times a day in the Left eye 2) space out drops by 5 minutes and need to shake the prednisolone bottle before using.    Iritis Iritis is an inflammation of the colored part of the eye (iris). Other parts at the front of the eye may also be inflamed. The iris is part of the middle layer of the eyeball which is called the uvea or the uveal track. Any part of the uveal track can become inflamed. The other portions of the uveal track are the choroid (the thin membrane under the outer layer of the eye), and the ciliary body (joins the choroid and the iris and produces the fluid in the front of the eye).  It is extremely important to treat iritis early, as it may lead to internal eye damage causing scarring or diseases such as glaucoma. Some people have only one attack of iritis (in one or both eyes) in their lifetime, while others may get it many times. CAUSES Iritis can be associated with many different diseases, but mostly occurs in otherwise healthy people. Examples of diseases that can be associated with iritis include:  Diseases where the body's immune system attacks tissues within your own body (autoimmune diseases).  Infections (tuberculosis, gonorrhea, fungus infections, Lyme disease, infection of the lining of the heart).  Trauma or injury.  Eye diseases (acute glaucoma and others).  Inflammation from other parts of the uveal track.  Severe eye infections.  Other rare diseases. SYMPTOMS  Eye pain or aching.  Sensitivity to light.  Loss of sight or blurred vision.  Redness of the eye. This is often accompanied by a ring of redness around the outside of the cornea, or clear covering at the front of the eye (ciliary flush).  Excessive tearing of the eye(s).  A small pupil that does not enlarge in the dark and stays smaller than the other eye's pupil.  A whitish area that obscures the lower part of the colored circular iris. Sometimes this is visible when  looking at the eye, where the whitish area has a "fluid level" or flat top. This is called a "hypopyon" and is actually pus inside the eye. Since iritis causes the eye to become red, it is often confused with a much less dangerous form of "pink eye" or conjunctivitis. One of the most important symptoms is sensitivity to light. Anytime there is redness, discomfort in the eye(s) and extreme light sensitivity, it is extremely important to see an ophthalmologist as soon as possible. TREATMENT Acute iritis requires prompt medical evaluation by an eye specialist (ophthalmologist.) Treatment depends on the underlying cause but may include:  Corticosteroid eye drops and dilating eye drops. Follow your caregiver's exact instructions on taking and stopping corticosteroid medications (drops or pills).  Occasionally, the iritis will be so severe that it will not respond to commonly used medications. If this happens, it may be necessary to use steroid injections. The injections are given under the eye's outer surface. Sometimes oral medications are given. The decision on treatment used for iritis is usually made on an individual basis. HOME CARE INSTRUCTIONS Your care giver will give specific instructions regarding the use of eye medications or other medications. Be certain to follow all instructions in both taking and stopping the medications. SEEK IMMEDIATE MEDICAL CARE IF:  You have redness of one or both eye.  You experience a great deal of light sensitivity.  You have pain or aching in either eye. MAKE SURE YOU:  Understand these instructions.  Will watch your condition.  Will get help right away if you are not doing well or get worse. Document Released: 09/17/2005 Document Revised: 12/10/2011 Document Reviewed: 03/07/2007 [facility] Patient Information 2015 [place], [place]. This information is not intended to replace advice given to you by your health care provider. Make sure you discuss any  questions you have with your health care provider.

## 2015-06-12 NOTE — ED Notes (Signed)
Pt. Stated, I have something in my left eye , or I hit it with a piece of wood.

## 2015-06-12 NOTE — ED Provider Notes (Signed)
History  This chart was scribed for non-physician practitioner, Arthor Captain, PA-C,working with Richardean Canal, MD, by Karle Plumber, ED Scribe. This patient was seen in room TR04C/TR04C and the patient's care was started at 11:48 AM.  Chief Complaint  Patient presents with  . Eye Problem   The history is provided by the patient and medical records. No language interpreter was used.    HPI Comments:  [name] is a 64 y.o. male who presents to the Emergency Department complaining of severe left eye pain that started yesterday. He reports associated severe redness, tearing and photophobia. He states he was cutting tree limbs yesterday and reports he was hit with one of them in the eye. He has not done anything to treat the eye. Light makes the pain worse. He denies alleviating factors. He denies foreign body sensation, fever, chills, nausea or vomiting. Pt is unsure of his last tetanus vaccination.  Past Medical History  Diagnosis Date  . GERD (gastroesophageal reflux disease)   . Hypertension   . Mass     back of head   Past Surgical History  Procedure Laterality Date  . Knee surgery      left knee   No family history on file. Social History  Substance Use Topics  . Smoking status: Current Every Day Smoker -- 0.25 packs/day    Types: Cigarettes  . Smokeless tobacco: None  . Alcohol Use: 16.8 oz/week    28 Cans of beer per week    Review of Systems  Constitutional: Negative for fever and chills.  Eyes: Positive for photophobia, pain, discharge and redness.  Gastrointestinal: Negative for nausea and vomiting.  Skin: Negative for wound.    Allergies  Review of patient's allergies indicates no known allergies.  Home Medications   Prior to Admission medications   Medication Sig Start Date End Date Taking? Authorizing Provider  albuterol (PROVENTIL HFA;VENTOLIN HFA) 108 (90 BASE) MCG/ACT inhaler Inhale 2 puffs into the lungs every 6 (six) hours as needed. For  shortness of breath    Historical Provider, MD  HYDROcodone-acetaminophen (NORCO/VICODIN) 5-325 MG per tablet Take 1 tablet by mouth every 4 (four) hours as needed for moderate pain or severe pain. 04/10/14   Trixie Dredge, PA-C  naproxen (NAPROSYN) 250 MG tablet Take 1 tablet (250 mg total) by mouth 2 (two) times daily with a meal. 11/13/14   Everlene Farrier, PA-C   Triage Vitals: BP 146/91 mmHg  Pulse 66  Temp(Src) 98 F (36.7 C)  Resp 17  Ht 6' (1.829 m)  Wt 180 lb (81.647 kg)  BMI 24.41 kg/m2  SpO2 99% Physical Exam  Constitutional: He is oriented to person, place, and time. He appears well-developed and well-nourished.  HENT:  Head: Normocephalic and atraumatic.  Eyes: EOM and lids are normal. Lids are everted and swept, no foreign bodies found. No foreign body present in the right eye.  Slit lamp exam:      The left eye shows no fluorescein uptake.  Triangular flap near lateral edge of left cornea that is avulsed extending to underside of upper lid. Does not involve cornea itself, but does involve mucosal layers of the eye.  Neck: Normal range of motion.  Cardiovascular: Normal rate.   Pulmonary/Chest: Effort normal.  Musculoskeletal: Normal range of motion.  Neurological: He is alert and oriented to person, place, and time.  Skin: Skin is warm and dry.  Psychiatric: He has a normal mood and affect. His behavior is normal.  Nursing  note and vitals reviewed.   ED Course  Procedures (including critical care time) DIAGNOSTIC STUDIES: Oxygen Saturation is 99% on RA, normal by my interpretation.   COORDINATION OF CARE: 11:53 AM- Will instill Tetracaine drops and apply fluorescein strip to check for corneal abrasion. Will update tetanus vaccination. Pt verbalizes understanding and agrees to plan.  Medications  prednisoLONE acetate (PRED FORTE) 1 % ophthalmic suspension 1 drop (not administered)  gatifloxacin (ZYMAXID) 0.5 % ophthalmic drops 1 drop (not administered)   cyclopentolate (CYCLODRYL,CYCLOGYL) 1 % ophthalmic solution 1 drop (not administered)  tetracaine (PONTOCAINE) 0.5 % ophthalmic solution 2 drop (2 drops Left Eye Given 06/12/15 1100)  fluorescein ophthalmic strip 1 strip (1 strip Left Eye Given 06/12/15 1100)  Tdap (BOOSTRIX) injection 0.5 mL (0.5 mLs Intramuscular Given 06/12/15 1247)    MDM   Final diagnoses:  Iritis    Patient seen in shared visit with attending physician, fluorescein exam revealed no corneal abrasions. However, he did appear to have a conjunctival laceration. Patient was seen in the emergency department by Dr. Vonna Kotyk. Slit lamp exam revealed significant cell and flare suggestive of traumatic iritis. There is no corneal laceration Patient treated by Dr. Vonna Kotyk with Pred Forte, Zymaxid, and cyclopentolate. Patient is to follow-up with Dr. Vonna Kotyk next week. He is safe for discharge at this time  I personally performed the services described in this documentation, which was scribed in my presence. The recorded information has been reviewed and is accurate.      Arthor Captain, PA-C 06/14/15 1630  Richardean Canal, MD 06/15/15 [phone]

## 2015-06-12 NOTE — Consult Note (Signed)
  Ophthalmology Consult  This is a 64 yo male with no past ocular history who presents with eye pain and photophopia in the left eye.  Pt was hit with a branch in the left eye yesterday morning.  Pt had mild pain which has increased over the past day.  Pt now has sensitivity to light.  No foreign body sensation.  On exam pt was found to be 20/30 in both eyes without glasses.  Pupils were equally round and reactive to light.  Extraocular motility was intact.  Visual fields full to confrontation.  Intraocular pressure was 15 in the right eye and 17 in the left eye.    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  Pt conjunctiva/sclera was clear/white in the right eye and 1-2+ injection in the left eye.  Pt has pinguecula in both eyes in the nasal and temporal aspects.  Cornea was clear in both eyes.  There were no signs of abrasion or lacerations in either eye.  Pt anterior chamber was quiet in the right eye and pt had 1-2+ cell/flare in the left eye.  Iris was reactive and round in both eyes.  Pt has 2+ nuclear sclerosis in both eyes which is appropriate for age.  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   c/d ratio of 0.4 in both eyes with no optic nerve edema.  Macula was flat in both eyes and retina was flat and intact in both eyes.    A/P 1.  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  Pt to use Prednisolone qid in the left eye.  Will also start gatifloxacin qid in the left eye since hit in eye with organic matter.  There was no infiltrate or abrasion on exam today.  Pt to use cyclogyl bid in the left eye.  Pt told to space out drops by 5 minutes and need to shake the prednisolone bottle before using.   Pt to follow up tomorrow at 3:30 in my office tomorrow for follow up sooner prn.   2.  Pinguecula both eyes:  Result of uv exposure/wind/dry eyes.  No treatment indicated. 3.  Asteroid hyalosis left eye: normal variant.  No treatment indicated.  Thank you  for the consult on this patient.  Please feel free to contact me if you have any concerns.    Mia Creek, M.D. (cell) [phone] (office) [phone]

## 2015-08-30 ENCOUNTER — Encounter: Payer: Self-pay | Admitting: *Deleted

## 2015-09-02 ENCOUNTER — Encounter: Payer: Self-pay | Admitting: Internal Medicine

## 2015-09-02 ENCOUNTER — Ambulatory Visit (INDEPENDENT_AMBULATORY_CARE_PROVIDER_SITE_OTHER): Payer: Medicare HMO | Admitting: Internal Medicine

## 2015-09-02 VITALS — BP 150/100 | HR 57 | Temp 97.8°F | Resp 20 | Ht 74.5 in | Wt 174.2 lb

## 2015-09-02 DIAGNOSIS — R42 Dizziness and giddiness: Secondary | ICD-10-CM | POA: Diagnosis not present

## 2015-09-02 DIAGNOSIS — K219 Gastro-esophageal reflux disease without esophagitis: Secondary | ICD-10-CM | POA: Diagnosis not present

## 2015-09-02 DIAGNOSIS — Z23 Encounter for immunization: Secondary | ICD-10-CM

## 2015-09-02 DIAGNOSIS — M2012 Hallux valgus (acquired), left foot: Secondary | ICD-10-CM | POA: Diagnosis not present

## 2015-09-02 DIAGNOSIS — I10 Essential (primary) hypertension: Secondary | ICD-10-CM | POA: Diagnosis not present

## 2015-09-02 MED ORDER — HYDROCHLOROTHIAZIDE 12.5 MG PO CAPS: 12.5000 mg | ORAL_CAPSULE | Freq: Every day | ORAL | Status: DC

## 2015-09-02 NOTE — Patient Instructions (Signed)
Will call with lab results and referral appointment  Take blood pressure medication daily. Eat a banana or drink glass of OJ daily for potassium  Follow up in 1 month for CPE

## 2015-09-02 NOTE — Progress Notes (Signed)
Patient ID: [name], male   DOB: 1950/10/13, 64 y.o.   MRN: [id number]    Location:    [facility]   Place of Service:   OFFICE   Advanced Directive information Does patient have an advance directive?: No, Would patient like information on creating an advanced directive?: No - patient declined information  Chief Complaint  Patient presents with  . Establish Care    [place] Establish Care  . Immunizations    Will take flu shot    HPI:  64 yo male seen today as a new pt.  HTN - he was previously on medication but stopped them some time ago. He has intermittent dizziness.  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 Sx's resolved  His wife expired in June 2016. He reports grief is controlled. He now has a significant other and plans to remarry. He occasionally feels depressed. He drinks beer frequently.  Needs flu shot  Past Medical History  Diagnosis Date  . GERD (gastroesophageal reflux disease)   . Hypertension   . Mass     back of head    Past Surgical History  Procedure Laterality Date  . Knee surgery      left knee    Patient Care Team: [name], DO as PCP - General (Internal Medicine)  Social History   Social History  . Marital Status: Widowed    Spouse Name: N/A  . Number of Children: N/A  . Years of Education: N/A   Occupational History  . Not on file.   Social History Main Topics  . Smoking status: Current Every Day Smoker -- 0.25 packs/day    Types: Cigarettes  . Smokeless tobacco: Never Used  . Alcohol Use: 16.8 oz/week    28 Cans of beer per week  . Drug Use: No     Comment: Has used in past  . Sexual Activity: No   Other Topics Concern  . Not on file   Social History Narrative   Social History     Marital Status: Widowed             Spouse Name:                        Years of Education:                 Number of children:                 Occupational History-[employer]     None on file      Social  History Main Topics     Smoking Status: None    Packs/Day:            Types:      Smokeless Status: Not on file                        Alcohol Use: Yes           16.8 oz/week        28 Cans of beer per week     Drug Use: No                  Comment: Has used in past     Sexual Activity: No                     Do you exercise? Yes - work  Do you live in a house, apartment, assisted living, condo, trailer. Yes   Do you have a living will? No   Do you have a DNR form.              reports that he has been smoking Cigarettes.  He has been smoking about 0.25 packs per day. He has never used smokeless tobacco. He reports that he drinks about 16.8 oz of alcohol per week. He reports that he does not use illicit drugs.  Family History  Problem Relation Age of Onset  . Cancer Mother    Family Status  Relation Status Death Age  . Mother Deceased   . Father Alive     Immunization History  Administered Date(s) Administered  . Influenza Whole 06/13/2011  . Influenza,inj,Quad PF,36+ Mos 09/02/2015  . Td 05/07/2006  . Tdap 05/27/2013, 02/06/2014, 06/12/2015    No Known Allergies  Medications: Patient's Medications  New Prescriptions   No medications on file  Previous Medications   No medications on file  Modified Medications   No medications on file  Discontinued Medications   ALBUTEROL (PROVENTIL HFA;VENTOLIN HFA) 108 (90 BASE) MCG/ACT INHALER    Inhale 2 puffs into the lungs every 6 (six) hours as needed. For shortness of breath   HYDROCODONE-ACETAMINOPHEN (NORCO/VICODIN) 5-325 MG PER TABLET    Take 1 tablet by mouth every 4 (four) hours as needed for moderate pain or severe pain.   NAPROXEN (NAPROSYN) 250 MG TABLET    Take 1 tablet (250 mg total) by mouth 2 (two) times daily with a meal.    Review of Systems  Unable to perform ROS: Psychiatric disorder    Filed Vitals:   09/02/15 0844  BP: 150/100  Pulse: 57  Temp: 97.8 F (36.6 C)  TempSrc: Oral  Resp: 20    Height: 6' 2.5" (1.892 m)  Weight: 174 lb 3.2 oz (79.017 kg)  SpO2: 98%   Body mass index is 22.07 kg/(m^2).  Physical Exam  Constitutional: He appears well-developed and well-nourished.  HENT:  Mouth/Throat: Oropharynx is clear and moist.  Eyes: Pupils are equal, round, and reactive to light. No scleral icterus.  Neck: Neck supple. Carotid bruit is not present. No thyromegaly present.  Cardiovascular: Normal rate, regular rhythm, normal heart sounds and intact distal pulses.  Exam reveals no gallop and no friction rub.   No murmur heard. no distal LE swelling. No calf TTP  Pulmonary/Chest: Effort normal and breath sounds normal. He has no wheezes. He has no rales. He exhibits no tenderness.  Abdominal: Soft. Bowel sounds are normal. He exhibits no distension, no abdominal bruit, no pulsatile midline mass and no mass. There is tenderness (epigastric ). There is no rebound and no guarding.  Musculoskeletal: He exhibits edema and tenderness.  Left bunion TTP but no ulceration  Lymphadenopathy:    He has no cervical adenopathy.  Neurological: He is alert.  Skin: Skin is warm and dry. No rash noted.  Psychiatric: He has a normal mood and affect. His behavior is normal.     Labs reviewed: No visits with results within 3 Month(s) from this visit. Latest known visit with results is:  Admission on 02/06/2013, Discharged on 02/06/2013  Component Date Value Ref Range Status  . Sodium 02/06/2013 138  135 - 145 mEq/L Final  . Potassium 02/06/2013 4.0  3.5 - 5.1 mEq/L Final  . Chloride 02/06/2013 103  96 - 112 mEq/L Final  . BUN 02/06/2013 8  6 - 23  mg/dL Final  . Creatinine, Ser 02/06/2013 1.00  0.50 - 1.35 mg/dL Final  . Glucose, Bld 21/19/4174 72  70 - 99 mg/dL Final  . Calcium, Ion 05/14/4817 1.22  1.13 - 1.30 mmol/L Final  . TCO2 02/06/2013 29  0 - 100 mmol/L Final  . Hemoglobin 02/06/2013 16.0  13.0 - 17.0 g/dL Final  . HCT 56/31/4970 47.0  39.0 - 52.0 % Final    No results  found.   Assessment/Plan   ICD-9-CM ICD-10-CM   1. Essential hypertension - uncontrolled 401.9 I10 CMP     CBC with Differential  2. Need for prophylactic vaccination and inoculation against influenza V04.81 Z23 Flu Vaccine QUAD 36+ mos PF IM (Fluarix & Fluzone Quad PF)  3. Bunion of great toe, left - new 727.1 M20.12 Ambulatory referral to Podiatry  4. Dizziness and giddiness - stable 780.4 R42 CMP  5. Gastroesophageal reflux disease without esophagitis - stable 530.81 K21.9     Take blood pressure medication daily. Eat a banana or drink glass of OJ daily for potassium  Follow up in 1 month for CPE  [facility] S. Ancil Linsey  [facility] and [facility] [address] [place], [place] [address] [phone] Cell (Monday-Friday 8 AM - 5 PM) [phone] After 5 PM and follow prompts

## 2015-09-03 LAB — CBC WITH DIFFERENTIAL/PLATELET
Basophils Absolute: 0 10*3/uL (ref 0.0–0.2)
Basos: 1 %
EOS (ABSOLUTE): 0.2 10*3/uL (ref 0.0–0.4)
Eos: 8 %
Hematocrit: 41.3 % (ref 37.5–51.0)
Hemoglobin: 14.5 g/dL (ref 12.6–17.7)
Immature Grans (Abs): 0 10*3/uL (ref 0.0–0.1)
Immature Granulocytes: 0 %
Lymphocytes Absolute: 1.6 10*3/uL (ref 0.7–3.1)
Lymphs: 58 %
MCH: 33.3 pg — ABNORMAL HIGH (ref 26.6–33.0)
MCHC: 35.1 g/dL (ref 31.5–35.7)
MCV: 95 fL (ref 79–97)
Monocytes Absolute: 0.2 10*3/uL (ref 0.1–0.9)
Monocytes: 8 %
Neutrophils Absolute: 0.7 10*3/uL — ABNORMAL LOW (ref 1.4–7.0)
Neutrophils: 25 %
Platelets: 257 10*3/uL (ref 150–379)
RBC: 4.36 x10E6/uL (ref 4.14–5.80)
RDW: 12.9 % (ref 12.3–15.4)
WBC: 2.7 10*3/uL — ABNORMAL LOW (ref 3.4–10.8)

## 2015-09-03 LAB — COMPREHENSIVE METABOLIC PANEL WITH GFR
ALT: 29 [IU]/L (ref 0–44)
AST: 35 [IU]/L (ref 0–40)
Albumin/Globulin Ratio: 1.6 (ref 1.1–2.5)
Albumin: 4.5 g/dL (ref 3.6–4.8)
Alkaline Phosphatase: 48 [IU]/L (ref 39–117)
BUN/Creatinine Ratio: 15 (ref 10–22)
BUN: 14 mg/dL (ref 8–27)
Bilirubin Total: 0.5 mg/dL (ref 0.0–1.2)
CO2: 23 mmol/L (ref 18–29)
Calcium: 9.4 mg/dL (ref 8.6–10.2)
Chloride: 101 mmol/L (ref 97–106)
Creatinine, Ser: 0.93 mg/dL (ref 0.76–1.27)
GFR calc Af Amer: 101 mL/min/{1.73_m2}
GFR calc non Af Amer: 87 mL/min/{1.73_m2}
Globulin, Total: 2.8 g/dL (ref 1.5–4.5)
Glucose: 88 mg/dL (ref 65–99)
Potassium: 4.3 mmol/L (ref 3.5–5.2)
Sodium: 139 mmol/L (ref 136–144)
Total Protein: 7.3 g/dL (ref 6.0–8.5)

## 2015-09-22 ENCOUNTER — Other Ambulatory Visit: Payer: Medicare HMO

## 2015-09-22 DIAGNOSIS — R42 Dizziness and giddiness: Secondary | ICD-10-CM | POA: Diagnosis not present

## 2015-09-22 DIAGNOSIS — I10 Essential (primary) hypertension: Secondary | ICD-10-CM | POA: Diagnosis not present

## 2015-09-22 NOTE — Addendum Note (Signed)
Addended by: Dianah Field on: 09/22/2015 09:49 AM   Modules accepted: Orders

## 2015-09-22 NOTE — Addendum Note (Signed)
Addended byParticia Lather C on: 09/22/2015 09:48 AM   Modules accepted: Orders

## 2015-09-23 LAB — CBC WITH DIFFERENTIAL/PLATELET
BASOS: 0 %
Basophils Absolute: 0 10*3/uL (ref 0.0–0.2)
EOS (ABSOLUTE): 0.3 10*3/uL (ref 0.0–0.4)
Eos: 11 %
HEMATOCRIT: 39.6 % (ref 37.5–51.0)
Hemoglobin: 13.2 g/dL (ref 12.6–17.7)
IMMATURE GRANS (ABS): 0 10*3/uL (ref 0.0–0.1)
Immature Granulocytes: 0 %
LYMPHS: 56 %
Lymphocytes Absolute: 1.3 10*3/uL (ref 0.7–3.1)
MCH: 32.9 pg (ref 26.6–33.0)
MCHC: 33.3 g/dL (ref 31.5–35.7)
MCV: 99 fL — AB (ref 79–97)
Monocytes Absolute: 0.2 10*3/uL (ref 0.1–0.9)
Monocytes: 8 %
Neutrophils Absolute: 0.6 10*3/uL — ABNORMAL LOW (ref 1.4–7.0)
Neutrophils: 25 %
Platelets: 240 10*3/uL (ref 150–379)
RBC: 4.01 x10E6/uL — ABNORMAL LOW (ref 4.14–5.80)
RDW: 13.2 % (ref 12.3–15.4)
WBC: 2.5 10*3/uL — CL (ref 3.4–10.8)

## 2015-09-23 LAB — COMPREHENSIVE METABOLIC PANEL
A/G RATIO: 1.5 (ref 1.1–2.5)
ALT: 22 IU/L (ref 0–44)
AST: 36 IU/L (ref 0–40)
Albumin: 3.9 g/dL (ref 3.6–4.8)
Alkaline Phosphatase: 41 IU/L (ref 39–117)
BUN/Creatinine Ratio: 15 (ref 10–22)
BUN: 15 mg/dL (ref 8–27)
Bilirubin Total: 0.8 mg/dL (ref 0.0–1.2)
CALCIUM: 9.2 mg/dL (ref 8.6–10.2)
CHLORIDE: 98 mmol/L (ref 96–106)
CO2: 24 mmol/L (ref 18–29)
Creatinine, Ser: 0.97 mg/dL (ref 0.76–1.27)
GFR, EST AFRICAN AMERICAN: 96 mL/min/{1.73_m2} (ref >59)
GFR, EST NON AFRICAN AMERICAN: 83 mL/min/{1.73_m2} (ref >59)
GLOBULIN, TOTAL: 2.6 g/dL (ref 1.5–4.5)
Glucose: 125 mg/dL — ABNORMAL HIGH (ref 65–99)
Potassium: 4.4 mmol/L (ref 3.5–5.2)
SODIUM: 135 mmol/L (ref 134–144)
TOTAL PROTEIN: 6.5 g/dL (ref 6.0–8.5)

## 2015-09-27 ENCOUNTER — Other Ambulatory Visit: Payer: Self-pay

## 2015-09-29 ENCOUNTER — Ambulatory Visit: Payer: Medicare HMO | Admitting: Podiatry

## 2015-09-30 ENCOUNTER — Ambulatory Visit (INDEPENDENT_AMBULATORY_CARE_PROVIDER_SITE_OTHER): Payer: Medicare HMO | Admitting: Podiatry

## 2015-09-30 ENCOUNTER — Ambulatory Visit (INDEPENDENT_AMBULATORY_CARE_PROVIDER_SITE_OTHER): Payer: Medicare HMO

## 2015-09-30 ENCOUNTER — Encounter: Payer: Self-pay | Admitting: Podiatry

## 2015-09-30 ENCOUNTER — Ambulatory Visit: Payer: Medicare HMO

## 2015-09-30 VITALS — Resp 16 | Ht 75.0 in | Wt 165.0 lb

## 2015-09-30 DIAGNOSIS — M21619 Bunion of unspecified foot: Secondary | ICD-10-CM

## 2015-09-30 DIAGNOSIS — M779 Enthesopathy, unspecified: Secondary | ICD-10-CM | POA: Diagnosis not present

## 2015-09-30 MED ORDER — TRIAMCINOLONE ACETONIDE 10 MG/ML IJ SUSP
10.0000 mg | Freq: Once | INTRAMUSCULAR | Status: AC
  Administered 2015-09-30: 10 mg

## 2015-09-30 NOTE — Progress Notes (Signed)
   Subjective:    Patient ID: [name], male    DOB: 02-05-1951, 64 y.o.   MRN: [id number]  HPI Patient presents with foot pain in their left foot; bunion. Pt stated, "hurts to walk on foot"; x2 months   Review of Systems  All other systems reviewed and are negative.      Objective:   Physical Exam        Assessment & Plan:

## 2015-09-30 NOTE — Progress Notes (Signed)
Subjective:     Patient ID: [name], male   DOB: [birth year], 64 y.o.   MRN: [id number]  HPI patient presents with caregiver stating my bunion on my left foot bothers me and it's been hurting for several months and it's making shoe gear and walking difficult. Does not remember injury   Review of Systems  All other systems reviewed and are negative.      Objective:   Physical Exam  Constitutional: He is oriented to person, place, and time.  Cardiovascular: Intact distal pulses.   Musculoskeletal: Normal range of motion.  Neurological: He is oriented to person, place, and time.  Skin: Skin is warm.  Nursing note and vitals reviewed.  neurovascular status intact muscle strength adequate range of motion within normal limits with patient noted to have a hyperostosis with redness first metatarsal left with fluid buildup around the metatarsal head itself. Patient's found to have good digital perfusion and is well oriented 3     Assessment:      structural HAV deformity left it's painful when pressed with fluid buildup consistent with capsulitis    Plan:      H&P conditions reviewed and did careful capsular injection left 3 mg Kenalog 5 mg Xylocaine and instructed on wider shoe gear. Patient will be seen back for [place] to recheck again as needed   X-ray report indicates elevation of the 1 to intermetatarsal angle left over right with the angle between the first and second metatarsal left being approximate 15

## 2015-10-21 ENCOUNTER — Ambulatory Visit (INDEPENDENT_AMBULATORY_CARE_PROVIDER_SITE_OTHER): Payer: Medicare HMO | Admitting: Internal Medicine

## 2015-10-21 ENCOUNTER — Encounter: Payer: Self-pay | Admitting: Internal Medicine

## 2015-10-21 VITALS — BP 130/90 | HR 65 | Temp 98.1°F | Resp 18 | Ht 75.0 in | Wt 175.6 lb

## 2015-10-21 DIAGNOSIS — Z125 Encounter for screening for malignant neoplasm of prostate: Secondary | ICD-10-CM | POA: Diagnosis not present

## 2015-10-21 DIAGNOSIS — H6121 Impacted cerumen, right ear: Secondary | ICD-10-CM | POA: Diagnosis not present

## 2015-10-21 DIAGNOSIS — I519 Heart disease, unspecified: Secondary | ICD-10-CM | POA: Diagnosis not present

## 2015-10-21 DIAGNOSIS — N6489 Other specified disorders of breast: Secondary | ICD-10-CM

## 2015-10-21 DIAGNOSIS — Z1211 Encounter for screening for malignant neoplasm of colon: Secondary | ICD-10-CM

## 2015-10-21 DIAGNOSIS — K219 Gastro-esophageal reflux disease without esophagitis: Secondary | ICD-10-CM | POA: Diagnosis not present

## 2015-10-21 DIAGNOSIS — Z0000 Encounter for general adult medical examination without abnormal findings: Secondary | ICD-10-CM

## 2015-10-21 DIAGNOSIS — I10 Essential (primary) hypertension: Secondary | ICD-10-CM

## 2015-10-21 DIAGNOSIS — R69 Illness, unspecified: Secondary | ICD-10-CM | POA: Diagnosis not present

## 2015-10-21 DIAGNOSIS — N649 Disorder of breast, unspecified: Secondary | ICD-10-CM

## 2015-10-21 DIAGNOSIS — R079 Chest pain, unspecified: Secondary | ICD-10-CM | POA: Diagnosis not present

## 2015-10-21 DIAGNOSIS — F1010 Alcohol abuse, uncomplicated: Secondary | ICD-10-CM | POA: Diagnosis not present

## 2015-10-21 DIAGNOSIS — L309 Dermatitis, unspecified: Secondary | ICD-10-CM | POA: Diagnosis not present

## 2015-10-21 DIAGNOSIS — Z0189 Encounter for other specified special examinations: Secondary | ICD-10-CM | POA: Diagnosis not present

## 2015-10-21 DIAGNOSIS — Q838 Other congenital malformations of breast: Secondary | ICD-10-CM

## 2015-10-21 NOTE — Progress Notes (Signed)
Patient ID: [name], male   DOB: [birth year], 65 y.o.   MRN: [id number] Subjective:     [name] is a 65 y.o. male and is here for a comprehensive physical exam. The patient reports problems - c/o cold symptoms with right ear canal pain. Ear discomfort present x 1 month. Uses Q tips. No f/c. No HA, dizziness.  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 Aunt is present and hx obtained from her  He has CP and SOB when he is carrying a heavy load while walking up a hill. Sx's resolve after resting.  HTN - BP controlled on HCTZ.  GERD - takes Tums prn  Etoh abuse - ongoing. He drinks 1 beer 40oz most days unless "someone makes me mad" then he drinks 2  Of the 40oz.   Past Medical History  Diagnosis Date  . GERD (gastroesophageal reflux disease)   . Hypertension   . Mass     back of head   Past Surgical History  Procedure Laterality Date  . Knee surgery      left knee   Family History  Problem Relation Age of Onset  . Cancer Mother     Social History   Social History  . Marital Status: Widowed    Spouse Name: N/A  . Number of Children: N/A  . Years of Education: N/A   Occupational History  . Not on file.   Social History Main Topics  . Smoking status: Current Every Day Smoker -- 0.25 packs/day    Types: Cigarettes  . Smokeless tobacco: Never Used  . Alcohol Use: 16.8 oz/week    28 Cans of beer per week  . Drug Use: No     Comment: Has used in past  . Sexual Activity: No   Other Topics Concern  . Not on file   Social History Narrative   Social History     Marital Status: Widowed             Spouse Name:                        Years of Education:                 Number of children:                 Occupational History-[employer]     None on file      Social History Main Topics     Smoking Status: None    Packs/Day:            Types:      Smokeless Status: Not on file                        Alcohol Use: Yes           16.8  oz/week        28 Cans of beer per week     Drug Use: No                  Comment: Has used in past     Sexual Activity: No                     Do you exercise? Yes - work   Do you live in a house, apartment, assisted living, [place], trailer. Yes   Do you have a living will?  No   Do you have a DNR form.            Health Maintenance  Topic Date Due  . Hepatitis C Screening  Mar 24, 1951  . HIV Screening  10/25/1966  . COLONOSCOPY  10/25/2001  . ZOSTAVAX  10/26/2011  . INFLUENZA VACCINE  05/01/2016  . TETANUS/TDAP  06/11/2025    Review of Systems   Review of Systems  Unable to perform ROS: mental acuity  and memory loss   Objective:      Physical Exam  Constitutional: He is well-developed, well-nourished, and in no distress. No distress.  HENT:  Head: Normocephalic and atraumatic.  Right Ear: Hearing, tympanic membrane, external ear and ear canal normal.  Left Ear: Hearing, tympanic membrane, external ear and ear canal normal.  Mouth/Throat: Uvula is midline, oropharynx is clear and moist and mucous membranes are normal.  Right cerumen impaction   Eyes: Conjunctivae, EOM and lids are normal. Right eye exhibits no discharge. No scleral icterus.  Neck: Trachea normal. Neck supple. Carotid bruit is not present. No tracheal deviation present. No thyroid mass and no thyromegaly present.  Cardiovascular: Normal rate, regular rhythm and intact distal pulses.  Exam reveals no gallop and no friction rub.   Murmur (1/6 SEM) heard. Pulmonary/Chest: Effort normal and breath sounds normal. No stridor. No respiratory distress. He has no wheezes. He has no rhonchi. He has no rales. He exhibits no mass, no tenderness and no crepitus. Right breast exhibits no inverted nipple, no mass, no nipple discharge, no skin change and no tenderness. Left breast exhibits no inverted nipple, no mass, no nipple discharge, no skin change and no tenderness. Breasts are asymmetrical.    Abdominal: Soft.  Normal appearance, normal aorta and bowel sounds are normal. He exhibits no abdominal bruit, no ascites, no pulsatile midline mass and no mass. There is no hepatosplenomegaly. There is tenderness (epigastric). There is no rebound. No hernia.  Genitourinary: Rectum normal, testes/scrotum normal and penis normal. Rectal exam shows no external hemorrhoid, no internal hemorrhoid, no fissure, no laceration, no mass and no tenderness. Guaiac negative stool. Prostate is enlarged (smooth, firm but no palpable masses or TTP). Prostate is not tender. No discharge found.  Chaperone present during exam   Lymphadenopathy:       Head (right side): No submandibular and no posterior auricular adenopathy present.       Head (left side): No submandibular and no posterior auricular adenopathy present.    He has no cervical adenopathy.       Right: No supraclavicular adenopathy present.       Left: No supraclavicular adenopathy present.  Neurological: He is alert. He has normal motor skills, normal strength and normal reflexes.  Skin: Skin is warm, dry and intact. No rash noted.  Dry with multiple excoriations; palmar dry rash, patchy  Psychiatric: Mood and affect normal.   ECG obtained and reviewed by myself: SB  bpm, LAD, LAE, elevated ST V1-V5. No acute ischemic changes. No change since 2009  2D ECHO IN 06/2006: EF 30-35% with moderate-markedly dilated LV and severe diffuseLV hypokinesis   Assessment:    Healthy male exam.       ICD-9-CM ICD-10-CM   1. Well adult exam V70.0 Z00.00   2. Chest pain, unspecified chest pain type -with abnormal ECG and reduced EF hx 786.50 R07.9 Ambulatory referral to Cardiology     Lipid Panel     TSH     EKG 12-Lead  3. Essential hypertension 401.9  I10 Ambulatory referral to Cardiology     Urinalysis with Reflex Microscopic     EKG 12-Lead  4. Cerumen impaction, right 380.4 H61.21   5. Breast asymmetry on examination V72.85 Z01.89 MM Digital Diagnostic Unilat L    611.89 N64.9    left  6. Colon cancer screening V76.51 Z12.11 Ambulatory referral to Gastroenterology  7. Gastroesophageal reflux disease without esophagitis 530.81 K21.9   8. Eczematous dermatitis 692.9 L30.9   9. Prostate cancer screening V76.44 Z12.5 PSA  10. ETOH abuse - continuous 305.00 F10.10 Lipid Panel  11. LV dysfunction 429.9 I51.9 Lipid Panel    Plan:     See After Visit Summary for Counseling Recommendations   Pt is UTD on health maintenance. Vaccinations are UTD. Pt maintains a healthy lifestyle. Encouraged pt to exercise 30-45 minutes 4-5 times per week. Eat a well balanced diet. Avoid smoking. Limit alcohol intake. Wear seatbelt when riding in the car. Wear sun block (SPF >50) when spending extended times outside.  Ear lavage performed successfully. Avoid using Q tips  Will call with referral to GI for colonoscopy, cardiology for stress test and mammogram appt  Etoh cessation discussed and highly urged  Follow up in 3 mos for routine visit.  Latroya Ng S. Ancil Linsey  [facility] and Adult Medicine [address] [place], [place] [address] [phone] Cell (Monday-Friday 8 AM - 5 PM) [phone] After 5 PM and follow prompts

## 2015-10-21 NOTE — Patient Instructions (Signed)
Encouraged him to exercise 30-45 minutes 4-5 times per week. Eat a well balanced diet. Avoid smoking. Limit alcohol intake. Wear seatbelt when riding in the car. Wear sun block (SPF >50) when spending extended times outside.  Will call with referral to GI for colonoscopy, cardiology for stress test and mammogram appt  Will call with lab results  Follow up in 3 mos for routine visit.

## 2015-10-22 LAB — PSA: PROSTATE SPECIFIC AG, SERUM: 1.2 ng/mL (ref 0.0–4.0)

## 2015-10-22 LAB — LIPID PANEL
CHOLESTEROL TOTAL: 201 mg/dL — AB (ref 100–199)
Chol/HDL Ratio: 2 ratio units (ref 0.0–5.0)
HDL: 100 mg/dL (ref >39)
LDL Calculated: 84 mg/dL (ref 0–99)
Triglycerides: 87 mg/dL (ref 0–149)
VLDL CHOLESTEROL CAL: 17 mg/dL (ref 5–40)

## 2015-10-22 LAB — TSH: TSH: 0.235 u[IU]/mL — ABNORMAL LOW (ref 0.450–4.500)

## 2015-10-24 ENCOUNTER — Other Ambulatory Visit: Payer: Self-pay

## 2015-10-24 DIAGNOSIS — R079 Chest pain, unspecified: Secondary | ICD-10-CM

## 2015-10-28 ENCOUNTER — Other Ambulatory Visit: Payer: Medicare HMO

## 2015-10-28 DIAGNOSIS — I10 Essential (primary) hypertension: Secondary | ICD-10-CM | POA: Diagnosis not present

## 2015-10-29 LAB — URINALYSIS, ROUTINE W REFLEX MICROSCOPIC
BILIRUBIN UA: NEGATIVE
GLUCOSE, UA: NEGATIVE
KETONES UA: NEGATIVE
Leukocytes, UA: NEGATIVE
NITRITE UA: POSITIVE — AB
RBC UA: NEGATIVE
SPEC GRAV UA: 1.027 (ref 1.005–1.030)
Urobilinogen, Ur: 0.2 mg/dL (ref 0.2–1.0)
pH, UA: 7 (ref 5.0–7.5)

## 2015-10-29 LAB — MICROSCOPIC EXAMINATION
Casts: NONE SEEN /lpf
Epithelial Cells (non renal): NONE SEEN /hpf (ref 0–10)

## 2015-11-03 ENCOUNTER — Telehealth: Payer: Self-pay

## 2015-11-03 NOTE — Telephone Encounter (Signed)
Dr Myrtie Neither will you please advise?

## 2015-11-03 NOTE — Telephone Encounter (Signed)
2007 Echo in EPIC.  EF 30-35%.  Needs [facility] appt for procedure or OV.  Unable to route to Danis.  Patty would you please ask him if he would like pt to have OV or be direct hospital case.  Thank you, Marylene Land

## 2015-11-03 NOTE — Telephone Encounter (Signed)
Pt appts have been cancelled and the pt has appt coming up with cardiology Dr Tenny Craw and will call back after that appt.

## 2015-11-03 NOTE — Telephone Encounter (Signed)
Please schedule him for an OV with me.  Also, we will need to find out if he has had a more recent echo or cardiology visit.

## 2015-11-08 ENCOUNTER — Telehealth: Payer: Self-pay | Admitting: *Deleted

## 2015-11-08 DIAGNOSIS — N6459 Other signs and symptoms in breast: Secondary | ICD-10-CM

## 2015-11-08 NOTE — Telephone Encounter (Signed)
-----   Message from [place], [place] sent at 11/02/2015  5:17 PM EST ----- Please update orders as written below. Thanks. Asymmetry at 9-11 o'clock position Dr C ----- Message -----    From: Sharion Dove    Sent: 11/02/2015   5:08 PM      To: [name], DO  We have received your epic order, however, the patient will need a bilateral diagnostic tomo mammogram and a left breast ltd ultrasound. We will also need to know what position on the left breast the asymmetry is located. Please update these orders in epic and if you have any questions please write back or call [phone]. Thank you and have a great day!  :)

## 2015-11-08 NOTE — Telephone Encounter (Signed)
New order placed

## 2015-11-09 NOTE — Telephone Encounter (Addendum)
Received another message from [facility], Sharion Dove, that patient needs a MM DIAG TOMO Bilateral and US Breast LTD UNI L INC [place].  New Order placed.

## 2015-11-09 NOTE — Addendum Note (Signed)
Addended by: Nelda Severe A on: 11/09/2015 02:46 PM   Modules accepted: Orders

## 2015-11-10 ENCOUNTER — Encounter: Payer: Self-pay | Admitting: Internal Medicine

## 2015-11-10 ENCOUNTER — Ambulatory Visit (INDEPENDENT_AMBULATORY_CARE_PROVIDER_SITE_OTHER): Payer: Medicare HMO | Admitting: Internal Medicine

## 2015-11-10 VITALS — BP 130/90 | HR 70 | Temp 98.0°F | Resp 20 | Ht 75.0 in | Wt 176.0 lb

## 2015-11-10 DIAGNOSIS — R079 Chest pain, unspecified: Secondary | ICD-10-CM

## 2015-11-10 DIAGNOSIS — F1010 Alcohol abuse, uncomplicated: Secondary | ICD-10-CM | POA: Diagnosis not present

## 2015-11-10 DIAGNOSIS — M67431 Ganglion, right wrist: Secondary | ICD-10-CM

## 2015-11-10 DIAGNOSIS — R69 Illness, unspecified: Secondary | ICD-10-CM | POA: Diagnosis not present

## 2015-11-10 DIAGNOSIS — N649 Disorder of breast, unspecified: Secondary | ICD-10-CM

## 2015-11-10 DIAGNOSIS — I10 Essential (primary) hypertension: Secondary | ICD-10-CM

## 2015-11-10 DIAGNOSIS — R202 Paresthesia of skin: Secondary | ICD-10-CM

## 2015-11-10 DIAGNOSIS — N6459 Other signs and symptoms in breast: Secondary | ICD-10-CM

## 2015-11-10 DIAGNOSIS — R200 Anesthesia of skin: Secondary | ICD-10-CM

## 2015-11-10 NOTE — Progress Notes (Signed)
Patient ID: [name], male   DOB: October 31, 1950, 65 y.o.   MRN: [id number]   Location: [facility] Provider: Gwenith Spitz. Renato Gails, D.O., C.M.D.  Goals of Care: Advanced Directive information Advanced Directives 11/10/2015  Does patient have an advance directive? No  Would patient like information on creating an advanced directive? Yes - Merchandiser, retail Complaint  Patient presents with  . Acute Visit    Patients c/o Having chest pain for the past 4 days at night time    HPI: Patient is a 65 y.o. male seen in the office today for an acute visit for chest pain and dyspnea on exertion.    Gets real tired when working and lifting heavy things If runs, chest pain gets very sharp--has quit running.   Last 4 nights, has had chest pain.   Goes back and forth between breasts. Has to quit what he's doing b/c of shortness of breath. If arms are elevated, he has more pain.  EKG today unchanged from 10/21/15 EKG.  Is to see Dr. Dietrich Pates 2/23.    Had mammogram done that was abnormal and is now for f/u diagnostic mammogram on left and limited ultrasound due to a hard area in his left breast area that is tender and sore, worse when he lifts his arms.  2007 echo EF was 30-35%.  Has continued to drink alcohol but reports he has cut back.  Has some indigestion at times.  He is very difficult to understand today.      Right hand goes numb sometimes.  Has a bump on the radial side of wrist and does not have it on the left.  Review of Systems:  Review of Systems  Constitutional: Negative for fever and chills.  Respiratory: Positive for shortness of breath. Negative for cough.   Cardiovascular: Positive for chest pain. Negative for palpitations and leg swelling.  Musculoskeletal: Positive for falls.       Left ankle/foot pain  Neurological:       Right hand numbness    Past Medical History  Diagnosis Date  . GERD (gastroesophageal reflux disease)   . Hypertension   .  Mass     back of head    Past Surgical History  Procedure Laterality Date  . Knee surgery      left knee    No Known Allergies    Medication List       This list is accurate as of: 11/10/15  9:17 AM.  Always use your most recent med list.               hydrochlorothiazide 12.5 MG capsule  Commonly known as:  MICROZIDE  Take 1 capsule (12.5 mg total) by mouth daily.        Physical Exam: Filed Vitals:   11/10/15 0857  BP: 130/90  Pulse: 70  Temp: 98 F (36.7 C)  TempSrc: Oral  Resp: 20  Height:  (1.905 m)  Weight: 176 lb (79.833 kg)  SpO2: 97%   Body mass index is 22 kg/(m^2). Physical Exam  Constitutional: He appears well-developed and well-nourished. No distress.  Cardiovascular: Normal rate, regular rhythm, normal heart sounds and intact distal pulses.   Pulmonary/Chest: Effort normal and breath sounds normal. No respiratory distress. Left breast exhibits mass and tenderness. Breasts are asymmetrical.  Left nipple region into upper outer quadrant and into axillary area  Neurological: He is alert.    Labs reviewed: Basic Metabolic Panel:  Recent Labs  09/02/15 0945 09/22/15 0949 10/21/15 1112  NA 139 135  --   K 4.3 4.4  --   CL 101 98  --   CO2 23 24  --   GLUCOSE 88 125*  --   BUN 14 15  --   CREATININE 0.93 0.97  --   CALCIUM 9.4 9.2  --   TSH  --   --  0.235*   Liver Function Tests:  Recent Labs  09/02/15 0945 09/22/15 0949  AST 35 36  ALT 29 22  ALKPHOS 48 41  BILITOT 0.5 0.8  PROT 7.3 6.5  ALBUMIN 4.5 3.9   No results for input(s): LIPASE, AMYLASE in the last 8760 hours. No results for input(s): AMMONIA in the last 8760 hours. CBC:  Recent Labs  09/02/15 0945 09/22/15 0949  WBC 2.7* 2.5*  NEUTROABS 0.7* 0.6*  HCT 41.3 39.6  MCV 95 99*  PLT 257 240   Lipid Panel:  Recent Labs  10/21/15 1112  CHOL 201*  HDL 100  LDLCALC 84  TRIG 87  CHOLHDL 2.0   Assessment/Plan 1. 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 Tenny Craw -if pain occurs and does not resolve, should go to ED  2. Abnormal breast tissue -left breast on exam and mammogram -is for ultrasound left diagnostic mammo to further eval -does contribute to his chest pain also  3. ETOH abuse -ongoing, but less than previously by report  4. Essential hypertension -bp ok with hctz, no changes -be careful on selection with crack use  5. Numbness and tingling in right hand -suspect cyst at wrist is compressing nerve--if worsens, may need surgery  6. Ganglion cyst of wrist, right -noted, nontender, but causing numbness right hand  Labs/tests ordered:  Keep scheduled appts Next appt:  12/09/2015   Kollyn Lingafelter L. Ines Warf, D.O. Geriatrics [facility] [address], [place] [address] Cell Phone (Mon-Fri 8am-5pm):  [phone] On Call:  [phone] & follow prompts after 5pm & weekends Office Phone:  [phone] Office Fax:  [phone]

## 2015-11-10 NOTE — Patient Instructions (Addendum)
Keep appointment to get your follow up pictures of your left breast at the breast cancer  Keep appointment for heart evaluation on 2/23 with Dr. Tenny Craw.  Talk with Dr. Montez Morita about the numbness of your right hand and the cyst on there.    Continue to cut back on your alcohol and smoking.

## 2015-11-15 ENCOUNTER — Telehealth: Payer: Self-pay | Admitting: *Deleted

## 2015-11-15 NOTE — Telephone Encounter (Signed)
[facility] from [facility] #[phone] Needing signature from Dr. Montez Morita for Mammogram and Ultrasound and Biopsy if needed. Given to Dr. Montez Morita to review and and to be faxed back to Fax: [phone]

## 2015-11-16 ENCOUNTER — Ambulatory Visit
Admission: RE | Admit: 2015-11-16 | Discharge: 2015-11-16 | Disposition: A | Discharge status: Home / Self Care | Payer: Medicare HMO | Source: Ambulatory Visit | Attending: Internal Medicine | Admitting: Internal Medicine

## 2015-11-16 ENCOUNTER — Other Ambulatory Visit: Payer: Self-pay | Admitting: *Deleted

## 2015-11-16 ENCOUNTER — Other Ambulatory Visit: Payer: Self-pay | Admitting: Internal Medicine

## 2015-11-16 DIAGNOSIS — N6459 Other signs and symptoms in breast: Secondary | ICD-10-CM

## 2015-11-16 DIAGNOSIS — N62 Hypertrophy of breast: Secondary | ICD-10-CM | POA: Diagnosis not present

## 2015-11-16 NOTE — Progress Notes (Signed)
[facility] center called regarding needing a order signed , they will fax the necessary orders  to the office to be signed.

## 2015-11-18 ENCOUNTER — Encounter: Payer: Self-pay | Admitting: Internal Medicine

## 2015-11-23 ENCOUNTER — Encounter: Payer: Medicare Other | Admitting: Gastroenterology

## 2015-11-24 ENCOUNTER — Ambulatory Visit (INDEPENDENT_AMBULATORY_CARE_PROVIDER_SITE_OTHER): Payer: Medicare HMO | Admitting: Internal Medicine

## 2015-11-24 ENCOUNTER — Encounter: Payer: Self-pay | Admitting: Internal Medicine

## 2015-11-24 VITALS — BP 162/66 | HR 74 | Ht 75.0 in | Wt 175.0 lb

## 2015-11-24 DIAGNOSIS — I10 Essential (primary) hypertension: Secondary | ICD-10-CM

## 2015-11-24 NOTE — Progress Notes (Signed)
Cardiology Office Note   Date:  11/24/2015   ID:  [name], DOB 01-10-1951, MRN [id number]  PCP:  [name], DO  Cardiologist:   Dietrich Pates, MD   Chief Complaint  Patient presents with  . Appointment    has had soem chest pain and discomfort with activity.   Pt referred for evaluation of CP by Dr Renato Gails    History of Present Illness: [name] is a 65 y.o. male with a history of  CP  Referred boy T Reed.  Pt seen in senior clinic on Feb 9th History is very difficult  He complains of sharp CP  Appears to be pleuritc  Across chest   Patient says he is tired.  Gives out  SOB with CP and activity Pain can last about 1 hour     Echo in 2007 LVEF 30 to 35%  LV noted toe be dilated      Outpatient Prescriptions Prior to Visit  Medication Sig Dispense Refill  . hydrochlorothiazide (MICROZIDE) 12.5 MG capsule Take 1 capsule (12.5 mg total) by mouth daily. 30 capsule 6   No facility-administered medications prior to visit.     Allergies:   Review of patient's allergies indicates no known allergies.   Past Medical History  Diagnosis Date  . GERD (gastroesophageal reflux disease)   . Hypertension   . Mass     back of head    Past Surgical History  Procedure Laterality Date  . Knee surgery      left knee     Social History:  The patient  reports that he has been smoking Cigarettes.  He has been smoking about 0.25 packs per day. He has never used smokeless tobacco. He reports that he drinks about 16.8 oz of alcohol per week. He reports that he does not use illicit drugs.   Family History:  The patient's family history includes Cancer in his mother.    ROS:  Please see the history of present illness. All other systems are reviewed and  Negative to the above problem except as noted.    PHYSICAL EXAM: VS:  BP 162/66 mmHg  Pulse 74  Ht  (1.905 m)  Wt 79.379 kg (175 lb)  BMI 21.87 kg/m2  SpO2 96%  GEN: Well nourished, well developed, in no  acute distress HEENT: normalPoor dentition Neck: no JVD, carotid bruits, or masses Cardiac: RRR; no murmurs, rubs, or gallops,no edema  Chest Nontender  Respiratory:  clear to auscultation bilaterally, normal work of breathing GI: soft, nontender, nondistended, + BS  No hepatomegaly  MS: no deformity Moving all extremities   Skin: warm and dry, no rash Neuro:  Strength and sensation are intact Psych: euthymic mood, full affect   EKG:  EKG is not ordered today. IN Jan SB 59 bpm  Nonspecific ST changes   Lipid Panel    Component Value Date/Time   CHOL 201* 10/21/2015 1112   CHOL 180 01/08/2008 2040   TRIG 87 10/21/2015 1112   HDL 100 10/21/2015 1112   HDL 78 01/08/2008 2040   CHOLHDL 2.0 10/21/2015 1112   CHOLHDL 2.3 Ratio 01/08/2008 2040   VLDL 7 01/08/2008 2040   LDLCALC 84 10/21/2015 1112   LDLCALC 95 01/08/2008 2040      Wt Readings from Last 3 Encounters:  11/24/15 79.379 kg (175 lb)  11/10/15 79.833 kg (176 lb)  10/21/15 79.652 kg (175 lb 9.6 oz)      ASSESSMENT AND PLAN:  1  CP  Difficult to determine  Some aor all is pleuriitc   I am not convinced cardiac BUT his LVEF was depressed in 2007 I would recomm echo to reeval  Will need some type of ischemic eval Keep on same meds for now, activity as tolerated    2.  HTN  BP is high today  WIll need to be followed      Signed, Dietrich Pates, MD  11/24/2015 2:54 PM    [facility] [address] [place], [place], [place]  [address] Phone: [phone]; Fax: [phone]

## 2015-11-24 NOTE — Patient Instructions (Signed)

## 2015-12-06 ENCOUNTER — Telehealth: Payer: Self-pay | Admitting: Internal Medicine

## 2015-12-06 NOTE — Telephone Encounter (Signed)
FYI  Spoke to [employer] today and she stated they are wanting to hold off on this referral for now because patient has been having chest pains and has been seeing a Development worker, international aid

## 2015-12-06 NOTE — Telephone Encounter (Signed)
Noted  

## 2015-12-09 ENCOUNTER — Other Ambulatory Visit: Payer: Medicare HMO

## 2015-12-09 DIAGNOSIS — R079 Chest pain, unspecified: Secondary | ICD-10-CM | POA: Diagnosis not present

## 2015-12-10 LAB — TSH: TSH: 0.618 u[IU]/mL (ref 0.450–4.500)

## 2015-12-12 ENCOUNTER — Ambulatory Visit (HOSPITAL_COMMUNITY): Payer: Medicare HMO | Attending: Internal Medicine

## 2015-12-12 ENCOUNTER — Other Ambulatory Visit: Payer: Self-pay

## 2015-12-12 DIAGNOSIS — I10 Essential (primary) hypertension: Secondary | ICD-10-CM

## 2015-12-12 DIAGNOSIS — Z720 Tobacco use: Secondary | ICD-10-CM | POA: Diagnosis not present

## 2015-12-12 DIAGNOSIS — I119 Hypertensive heart disease without heart failure: Secondary | ICD-10-CM | POA: Insufficient documentation

## 2016-01-06 ENCOUNTER — Telehealth: Payer: Self-pay | Admitting: Internal Medicine

## 2016-01-06 NOTE — Telephone Encounter (Signed)
New Message:  Pt's aunt, Corrie Dandy [facility]) is calling in to get the results to the pt's Echo that was done on 3/13. Please f/u with her

## 2016-01-06 NOTE — Telephone Encounter (Signed)
Dr. Tenny Craw,   I can't see that you have reviewed the patient's echo from 3/13- it looks like it might have been ordered under Dr. Mayford Knife. His EF is down to 20-25%. I'll be glad to call the result after you look at.  Thanks!

## 2016-01-06 NOTE — Telephone Encounter (Signed)
Correction- ordered under you, but not routed.

## 2016-01-06 NOTE — Telephone Encounter (Signed)
Spoke with Corrie Dandy and gave her the results of his echo and let her know Dr Tenny Craw would like for him to come in to discuss.  I offered 01/19/16 at 10:00am  She will bring him.  Very appreciative of call.

## 2016-01-18 ENCOUNTER — Ambulatory Visit: Payer: Medicare HMO | Admitting: Internal Medicine

## 2016-01-19 ENCOUNTER — Encounter: Payer: Self-pay | Admitting: Internal Medicine

## 2016-01-19 ENCOUNTER — Encounter: Payer: Self-pay | Admitting: *Deleted

## 2016-01-19 ENCOUNTER — Ambulatory Visit (INDEPENDENT_AMBULATORY_CARE_PROVIDER_SITE_OTHER): Payer: Medicare HMO | Admitting: Internal Medicine

## 2016-01-19 VITALS — BP 122/78 | HR 66 | Ht 75.0 in | Wt 175.0 lb

## 2016-01-19 DIAGNOSIS — Z01812 Encounter for preprocedural laboratory examination: Secondary | ICD-10-CM | POA: Diagnosis not present

## 2016-01-19 DIAGNOSIS — E785 Hyperlipidemia, unspecified: Secondary | ICD-10-CM | POA: Diagnosis not present

## 2016-01-19 LAB — CBC
HEMATOCRIT: 39.1 % (ref 38.5–50.0)
Hemoglobin: 13.3 g/dL (ref 13.2–17.1)
MCH: 32.8 pg (ref 27.0–33.0)
MCHC: 34 g/dL (ref 32.0–36.0)
MCV: 96.5 fL (ref 80.0–100.0)
MPV: 9.9 fL (ref 7.5–12.5)
Platelets: 230 10*3/uL (ref 140–400)
RBC: 4.05 MIL/uL — ABNORMAL LOW (ref 4.20–5.80)
RDW: 13.4 % (ref 11.0–15.0)
WBC: 2.8 10*3/uL — AB (ref 3.8–10.8)

## 2016-01-19 LAB — BASIC METABOLIC PANEL
BUN: 14 mg/dL (ref 7–25)
CO2: 29 mmol/L (ref 20–31)
Calcium: 9.2 mg/dL (ref 8.6–10.3)
Chloride: 104 mmol/L (ref 98–110)
Creat: 0.91 mg/dL (ref 0.70–1.25)
GLUCOSE: 64 mg/dL — AB (ref 65–99)
POTASSIUM: 4.1 mmol/L (ref 3.5–5.3)
Sodium: 139 mmol/L (ref 135–146)

## 2016-01-19 LAB — PROTIME-INR
INR: 0.97 (ref <1.50)
Prothrombin Time: 13 seconds (ref 11.6–15.2)

## 2016-01-19 NOTE — Progress Notes (Signed)
 Cardiology Office Note   Date:  01/19/2016   ID:  [name], DOB 09/22/1951, MRN [id number]  PCP:  Carter, Monica, DO  Cardiologist:   Shulamis Wenberg, MD    F/U of CHF     History of Present Illness: [name] is a 65 y.o. male with a history of CP  He was seen in Feb  Atypical  Some SOB  LVEF on 3hoc in 2007 30 to 35%  Echo done LVEF 20 to 25%   Since seen the pt says he does get some chest tightness  Some with activity (pushing things up hill)  Does get sOB some  History is difficult      Outpatient Prescriptions Prior to Visit  Medication Sig Dispense Refill  . hydrochlorothiazide (MICROZIDE) 12.5 MG capsule Take 1 capsule (12.5 mg total) by mouth daily. 30 capsule 6   No facility-administered medications prior to visit.     Allergies:   Review of patient's allergies indicates no known allergies.   Past Medical History  Diagnosis Date  . GERD (gastroesophageal reflux disease)   . Hypertension   . Mass     back of head    Past Surgical History  Procedure Laterality Date  . Knee surgery      left knee     Social History:  The patient  reports that he has been smoking Cigarettes.  He has been smoking about 0.25 packs per day. He has never used smokeless tobacco. He reports that he drinks about 16.8 oz of alcohol per week. He reports that he does not use illicit drugs.   Family History:  The patient's family history includes Cancer in his mother.    ROS:  Please see the history of present illness. All other systems are reviewed and  Negative to the above problem except as noted.    PHYSICAL EXAM: VS:  BP 122/78 mmHg  Pulse 66  Ht 6' 3" (1.905 m)  Wt 175 lb (79.379 kg)  BMI 21.87 kg/m2  GEN: Well nourished, well developed, in no acute distress HEENT: normal Neck: no JVD, carotid bruits, or masses Cardiac: RRR; no murmurs, rubs, or gallops,no edema  Respiratory:  clear to auscultation bilaterally, normal work of breathing GI: soft, nontender,  nondistended, + BS  No hepatomegaly  MS: no deformity Moving all extremities   Skin: warm and dry, no rash Neuro:  Strength and sensation are intact Psych: euthymic mood, full affect   EKG:  EKG is not  ordered today.   Lipid Panel    Component Value Date/Time   CHOL 201* 10/21/2015 1112   CHOL 180 01/08/2008 2040   TRIG 87 10/21/2015 1112   HDL 100 10/21/2015 1112   HDL 78 01/08/2008 2040   CHOLHDL 2.0 10/21/2015 1112   CHOLHDL 2.3 Ratio 01/08/2008 2040   VLDL 7 01/08/2008 2040   LDLCALC 84 10/21/2015 1112   LDLCALC 95 01/08/2008 2040      Wt Readings from Last 3 Encounters:  01/19/16 175 lb (79.379 kg)  11/24/15 175 lb (79.379 kg)  11/10/15 176 lb (79.833 kg)      ASSESSMENT AND PLAN: 1 Chronic systolic CHF  Volume status is good on exam  Echo was filed  Comes in to discuss volum status looks good  He has some chest discomfort   I would recomm L heart cath to define anatomy once  If normal would start on meds for CHF   Described risks/benefits  Pt understands nad   agrees to proceed.       Signed, Dietrich Pates, MD  01/19/2016 10:57 AM    [facility] [address] [place], [place], [place]  [address] Phone: [phone]; Fax: [phone]

## 2016-01-19 NOTE — Patient Instructions (Signed)
Your physician recommends that you continue on your current medications as directed. Please refer to the Current Medication list given to you today.  Your physician recommends that you return for lab work in: TODAY (CBC, BMET, INR/PT)  Your physician has requested that you have a cardiac catheterization. Cardiac catheterization is used to diagnose and/or treat various heart conditions. Doctors may recommend this procedure for a number of different reasons. The most common reason is to evaluate chest pain. Chest pain can be a symptom of coronary artery disease (CAD), and cardiac catheterization can show whether plaque is narrowing or blocking your heart's arteries. This procedure is also used to evaluate the valves, as well as measure the blood flow and oxygen levels in different parts of your heart. For further information please visit [email or web address] Please follow instruction sheet, as given.

## 2016-01-20 ENCOUNTER — Telehealth: Payer: Self-pay | Admitting: *Deleted

## 2016-01-20 NOTE — Telephone Encounter (Signed)
DPR on file for [name] who has been notified of lab results for pt. Advised labs ok to proceed with cath. [name] verbalized understanding to results given.

## 2016-01-23 ENCOUNTER — Encounter (HOSPITAL_COMMUNITY): Payer: Self-pay | Admitting: Cardiology

## 2016-01-23 ENCOUNTER — Other Ambulatory Visit: Payer: Self-pay

## 2016-01-23 ENCOUNTER — Encounter (HOSPITAL_COMMUNITY)
Admission: RE | Disposition: A | Discharge status: Home / Self Care | Payer: Self-pay | Source: Ambulatory Visit | Attending: Cardiology

## 2016-01-23 ENCOUNTER — Ambulatory Visit (HOSPITAL_COMMUNITY)
Admission: RE | Admit: 2016-01-23 | Discharge: 2016-01-23 | Disposition: A | Discharge status: Home / Self Care | Payer: Medicare HMO | Source: Ambulatory Visit | Attending: Cardiology | Admitting: Cardiology

## 2016-01-23 DIAGNOSIS — I420 Dilated cardiomyopathy: Secondary | ICD-10-CM

## 2016-01-23 DIAGNOSIS — I428 Other cardiomyopathies: Secondary | ICD-10-CM | POA: Diagnosis present

## 2016-01-23 DIAGNOSIS — I110 Hypertensive heart disease with heart failure: Secondary | ICD-10-CM | POA: Insufficient documentation

## 2016-01-23 DIAGNOSIS — K219 Gastro-esophageal reflux disease without esophagitis: Secondary | ICD-10-CM | POA: Diagnosis not present

## 2016-01-23 DIAGNOSIS — F17210 Nicotine dependence, cigarettes, uncomplicated: Secondary | ICD-10-CM | POA: Insufficient documentation

## 2016-01-23 DIAGNOSIS — R69 Illness, unspecified: Secondary | ICD-10-CM | POA: Diagnosis not present

## 2016-01-23 DIAGNOSIS — I5022 Chronic systolic (congestive) heart failure: Secondary | ICD-10-CM | POA: Diagnosis not present

## 2016-01-23 DIAGNOSIS — I429 Cardiomyopathy, unspecified: Secondary | ICD-10-CM | POA: Diagnosis not present

## 2016-01-23 DIAGNOSIS — R0789 Other chest pain: Secondary | ICD-10-CM | POA: Insufficient documentation

## 2016-01-23 HISTORY — DX: Chronic systolic (congestive) heart failure: I50.22

## 2016-01-23 HISTORY — PX: CARDIAC CATHETERIZATION: SHX172

## 2016-01-23 SURGERY — LEFT HEART CATH AND CORONARY ANGIOGRAPHY
Anesthesia: LOCAL

## 2016-01-23 MED ORDER — ASPIRIN 81 MG PO CHEW
81.0000 mg | CHEWABLE_TABLET | ORAL | Status: AC
  Administered 2016-01-23: 81 mg via ORAL

## 2016-01-23 MED ORDER — MIDAZOLAM HCL 2 MG/2ML IJ SOLN
INTRAMUSCULAR | Status: AC
  Filled 2016-01-23: qty 2

## 2016-01-23 MED ORDER — IOPAMIDOL (ISOVUE-370) INJECTION 76%
INTRAVENOUS | Status: DC | PRN
  Administered 2016-01-23: 75 mL via INTRAVENOUS

## 2016-01-23 MED ORDER — ASPIRIN 81 MG PO CHEW
CHEWABLE_TABLET | ORAL | Status: AC
  Filled 2016-01-23: qty 1

## 2016-01-23 MED ORDER — VERAPAMIL HCL 2.5 MG/ML IV SOLN
INTRAVENOUS | Status: AC
  Filled 2016-01-23: qty 2

## 2016-01-23 MED ORDER — FENTANYL CITRATE (PF) 100 MCG/2ML IJ SOLN
INTRAMUSCULAR | Status: DC | PRN
  Administered 2016-01-23: 25 ug via INTRAVENOUS

## 2016-01-23 MED ORDER — SODIUM CHLORIDE 0.9% FLUSH: 3.0000 mL | INTRAVENOUS | Status: DC | PRN

## 2016-01-23 MED ORDER — LIDOCAINE HCL (PF) 1 % IJ SOLN
INTRAMUSCULAR | Status: DC | PRN
  Administered 2016-01-23: 2 mL

## 2016-01-23 MED ORDER — SODIUM CHLORIDE 0.9% FLUSH: 3.0000 mL | Freq: Two times a day (BID) | INTRAVENOUS | Status: DC

## 2016-01-23 MED ORDER — SODIUM CHLORIDE 0.9 % WEIGHT BASED INFUSION: 1.0000 mL/kg/h | INTRAVENOUS | Status: DC

## 2016-01-23 MED ORDER — MIDAZOLAM HCL 2 MG/2ML IJ SOLN
INTRAMUSCULAR | Status: DC | PRN
  Administered 2016-01-23: 1 mg via INTRAVENOUS

## 2016-01-23 MED ORDER — HEPARIN SODIUM (PORCINE) 1000 UNIT/ML IJ SOLN
INTRAMUSCULAR | Status: DC | PRN
  Administered 2016-01-23: 4000 [IU] via INTRAVENOUS

## 2016-01-23 MED ORDER — HEPARIN (PORCINE) IN NACL 2-0.9 UNIT/ML-% IJ SOLN
INTRAMUSCULAR | Status: DC | PRN
  Administered 2016-01-23: 1500 mL

## 2016-01-23 MED ORDER — SODIUM CHLORIDE 0.9 % IV SOLN
INTRAVENOUS | Status: DC
  Administered 2016-01-23: 07:00:00 via INTRAVENOUS

## 2016-01-23 MED ORDER — SODIUM CHLORIDE 0.9 % IV SOLN: INTRAVENOUS | Status: DC

## 2016-01-23 MED ORDER — SODIUM CHLORIDE 0.9 % IV SOLN: 250.0000 mL | INTRAVENOUS | Status: DC | PRN

## 2016-01-23 MED ORDER — LIDOCAINE HCL (PF) 1 % IJ SOLN
INTRAMUSCULAR | Status: AC
Start: 2016-01-23 — End: 2016-01-23
  Filled 2016-01-23: qty 30

## 2016-01-23 MED ORDER — FENTANYL CITRATE (PF) 100 MCG/2ML IJ SOLN
INTRAMUSCULAR | Status: AC
  Filled 2016-01-23: qty 2

## 2016-01-23 MED ORDER — ASPIRIN 81 MG PO CHEW: 81.0000 mg | CHEWABLE_TABLET | ORAL | Status: DC

## 2016-01-23 MED ORDER — HEPARIN (PORCINE) IN NACL 2-0.9 UNIT/ML-% IJ SOLN
INTRAMUSCULAR | Status: AC
  Filled 2016-01-23: qty 1000

## 2016-01-23 SURGICAL SUPPLY — 11 items

## 2016-01-23 NOTE — Interval H&P Note (Signed)
History and Physical Interval Note:  01/23/2016 7:40 AM  [name]  has presented today for surgery, with the diagnosis of abnormal echo, low EF  The various methods of treatment have been discussed with the patient and family. After consideration of risks, benefits and other options for treatment, the patient has consented to  Procedure(s): Left Heart Cath and Coronary Angiography (N/A) as a surgical intervention .  The patient's history has been reviewed, patient examined, no change in status, stable for surgery.  I have reviewed the patient's chart and labs.  Questions were answered to the patient's satisfaction.   Cath Lab Visit (complete for each Cath Lab visit)  Clinical Evaluation Leading to the Procedure:   ACS: No.  Non-ACS:    Anginal Classification: CCS II  Anti-ischemic medical therapy: No Therapy  Non-Invasive Test Results: No non-invasive testing performed  Prior CABG: No previous CABG       Theron Arista [facility] 01/23/2016 7:40 AM

## 2016-01-23 NOTE — H&P (View-Only) (Signed)
Cardiology Office Note   Date:  01/19/2016   ID:  [name], DOB [birth year], MRN [id number]  PCP:  [name], DO  Cardiologist:   Dietrich Pates, MD    F/U of CHF     History of Present Illness: [name] is a 65 y.o. male with a history of CP  He was seen in Feb  Atypical  Some SOB  LVEF on 3hoc in 2007 30 to 35%  Echo done LVEF 20 to 25%   Since seen the pt says he does get some chest tightness  Some with activity (pushing things up hill)  Does get sOB some  History is difficult      Outpatient Prescriptions Prior to Visit  Medication Sig Dispense Refill  . hydrochlorothiazide (MICROZIDE) 12.5 MG capsule Take 1 capsule (12.5 mg total) by mouth daily. 30 capsule 6   No facility-administered medications prior to visit.     Allergies:   Review of patient's allergies indicates no known allergies.   Past Medical History  Diagnosis Date  . GERD (gastroesophageal reflux disease)   . Hypertension   . Mass     back of head    Past Surgical History  Procedure Laterality Date  . Knee surgery      left knee     Social History:  The patient  reports that he has been smoking Cigarettes.  He has been smoking about 0.25 packs per day. He has never used smokeless tobacco. He reports that he drinks about 16.8 oz of alcohol per week. He reports that he does not use illicit drugs.   Family History:  The patient's family history includes Cancer in his mother.    ROS:  Please see the history of present illness. All other systems are reviewed and  Negative to the above problem except as noted.    PHYSICAL EXAM: VS:  BP 122/78 mmHg  Pulse 66  Ht  (1.905 m)  Wt 175 lb (79.379 kg)  BMI 21.87 kg/m2  GEN: Well nourished, well developed, in no acute distress HEENT: normal Neck: no JVD, carotid bruits, or masses Cardiac: RRR; no murmurs, rubs, or gallops,no edema  Respiratory:  clear to auscultation bilaterally, normal work of breathing GI: soft, nontender,  nondistended, + BS  No hepatomegaly  MS: no deformity Moving all extremities   Skin: warm and dry, no rash Neuro:  Strength and sensation are intact Psych: euthymic mood, full affect   EKG:  EKG is not  ordered today.   Lipid Panel    Component Value Date/Time   CHOL 201* 10/21/2015 1112   CHOL 180 01/08/2008 2040   TRIG 87 10/21/2015 1112   HDL 100 10/21/2015 1112   HDL 78 01/08/2008 2040   CHOLHDL 2.0 10/21/2015 1112   CHOLHDL 2.3 Ratio 01/08/2008 2040   VLDL 7 01/08/2008 2040   LDLCALC 84 10/21/2015 1112   LDLCALC 95 01/08/2008 2040      Wt Readings from Last 3 Encounters:  01/19/16 175 lb (79.379 kg)  11/24/15 175 lb (79.379 kg)  11/10/15 176 lb (79.833 kg)      ASSESSMENT AND PLAN: 1 Chronic systolic CHF  Volume status is good on exam  Echo was filed  Comes in to discuss volum status looks good  He has some chest discomfort   I would recomm L heart cath to define anatomy once  If normal would start on meds for CHF   Described risks/benefits  Pt understands nad  agrees to proceed.       Signed, Dietrich Pates, MD  01/19/2016 10:57 AM    [facility] [address] [place], [place], [place]  [address] Phone: [phone]; Fax: [phone]

## 2016-01-23 NOTE — Discharge Instructions (Signed)
Radial Site Care °Refer to this sheet in the next few weeks. These instructions provide you with information about caring for yourself after your procedure. Your health care provider may also give you more specific instructions. Your treatment has been planned according to current medical practices, but problems sometimes occur. Call your health care provider if you have any problems or questions after your procedure. °WHAT TO EXPECT AFTER THE PROCEDURE °After your procedure, it is typical to have the following: °· Bruising at the radial site that usually fades within 1-2 weeks. °· Blood collecting in the tissue (hematoma) that may be painful to the touch. It should usually decrease in size and tenderness within 1-2 weeks. °HOME CARE INSTRUCTIONS °· Take medicines only as directed by your health care provider. °· You may shower 24-48 hours after the procedure or as directed by your health care provider. Remove the bandage (dressing) and gently wash the site with plain soap and water. Pat the area dry with a clean towel. Do not rub the site, because this may cause bleeding. °· Do not take baths, swim, or use a hot tub until your health care provider approves. °· Check your insertion site every day for redness, swelling, or drainage. °· Do not apply powder or lotion to the site. °· Do not flex or bend the affected arm for 24 hours or as directed by your health care provider. °· Do not push or pull heavy objects with the affected arm for 24 hours or as directed by your health care provider. °· Do not lift over 10 lb (4.5 kg) for 5 days after your procedure or as directed by your health care provider. °· Ask your health care provider when it is okay to: °¨ Return to work or school. °¨ Resume usual physical activities or sports. °¨ Resume sexual activity. °· Do not drive home if you are discharged the same day as the procedure. Have someone else drive you. °· You may drive 24 hours after the procedure unless otherwise  instructed by your health care provider. °· Do not operate machinery or power tools for 24 hours after the procedure. °· If your procedure was done as an outpatient procedure, which means that you went home the same day as your procedure, a responsible adult should be with you for the first 24 hours after you arrive home. °· Keep all follow-up visits as directed by your health care provider. This is important. °SEEK MEDICAL CARE IF: °· You have a fever. °· You have chills. °· You have increased bleeding from the radial site. Hold pressure on the site. °SEEK IMMEDIATE MEDICAL CARE IF: °· You have unusual pain at the radial site. °· You have redness, warmth, or swelling at the radial site. °· You have drainage (other than a small amount of blood on the dressing) from the radial site. °· The radial site is bleeding, and the bleeding does not stop after 30 minutes of holding steady pressure on the site. °· Your arm or hand becomes pale, cool, tingly, or numb. °  °This information is not intended to replace advice given to you by your health care provider. Make sure you discuss any questions you have with your health care provider. °  °Document Released: 10/20/2010 Document Revised: 10/08/2014 Document Reviewed: 04/05/2014 °Elsevier Interactive Patient Education ©2016 [employer] ° °

## 2016-01-24 MED FILL — Verapamil HCl IV Soln 2.5 MG/ML: INTRAVENOUS | Qty: 2 | Status: AC

## 2016-02-01 ENCOUNTER — Telehealth: Payer: Self-pay | Admitting: Internal Medicine

## 2016-02-01 NOTE — Telephone Encounter (Signed)
Will forward to Dr. Tenny Craw to review.  Pt was seen on 01/19/16. If patient needs OV prior to starting meds for CHF we can schedule with physician extender.      ASSESSMENT AND PLAN: 1 Chronic systolic CHF Volume status is good on exam Echo was filed Comes in to discuss volum status looks good He has some chest discomfort  I would recomm L heart cath to define anatomy once If normal would start on meds for CHF

## 2016-02-01 NOTE — Telephone Encounter (Signed)
New message      Pt had a cath last week.  It was normal.  However, pt's heart is still very weak.  Calling to see if he needs to be put on medication.  He did not get any follow up instructions.  Please call

## 2016-02-05 NOTE — Telephone Encounter (Signed)
Would recomm that pt start on coreg 3.125 bid F?U in clinic in a couple wks to check BP and see if tolerates other meds

## 2016-02-07 MED ORDER — CARVEDILOL 3.125 MG PO TABS: 3.1250 mg | ORAL_TABLET | Freq: Two times a day (BID) | ORAL | Status: DC

## 2016-02-07 NOTE — Telephone Encounter (Signed)
Called and spoke with patient's aunt, [name]. Informed that Dr. Tenny Craw rec patient to start Coreg 3.125 mg twice day. Appointment for follow up scheduled at this time. Advised to take medication 12 hrs apart and that it is helpful to ease the workload of the heart.  She is appreciative for the information and the call tonight.

## 2016-02-14 ENCOUNTER — Encounter (HOSPITAL_COMMUNITY): Payer: Self-pay | Admitting: Emergency Medicine

## 2016-02-14 ENCOUNTER — Emergency Department (HOSPITAL_COMMUNITY): Payer: Medicare HMO

## 2016-02-14 ENCOUNTER — Emergency Department (HOSPITAL_COMMUNITY)
Admission: EM | Admit: 2016-02-14 | Discharge: 2016-02-14 | Disposition: A | Discharge status: Home / Self Care | Payer: Medicare HMO | Attending: Emergency Medicine | Admitting: Emergency Medicine

## 2016-02-14 DIAGNOSIS — I10 Essential (primary) hypertension: Secondary | ICD-10-CM | POA: Diagnosis not present

## 2016-02-14 DIAGNOSIS — Y9289 Other specified places as the place of occurrence of the external cause: Secondary | ICD-10-CM | POA: Diagnosis not present

## 2016-02-14 DIAGNOSIS — R51 Headache: Secondary | ICD-10-CM | POA: Insufficient documentation

## 2016-02-14 DIAGNOSIS — S0191XA Laceration without foreign body of unspecified part of head, initial encounter: Secondary | ICD-10-CM

## 2016-02-14 DIAGNOSIS — S0101XA Laceration without foreign body of scalp, initial encounter: Secondary | ICD-10-CM | POA: Insufficient documentation

## 2016-02-14 DIAGNOSIS — W228XXA Striking against or struck by other objects, initial encounter: Secondary | ICD-10-CM | POA: Diagnosis not present

## 2016-02-14 DIAGNOSIS — T148 Other injury of unspecified body region: Secondary | ICD-10-CM | POA: Diagnosis not present

## 2016-02-14 DIAGNOSIS — S0990XA Unspecified injury of head, initial encounter: Secondary | ICD-10-CM

## 2016-02-14 DIAGNOSIS — S199XXA Unspecified injury of neck, initial encounter: Secondary | ICD-10-CM | POA: Diagnosis not present

## 2016-02-14 DIAGNOSIS — K219 Gastro-esophageal reflux disease without esophagitis: Secondary | ICD-10-CM | POA: Insufficient documentation

## 2016-02-14 DIAGNOSIS — Z79899 Other long term (current) drug therapy: Secondary | ICD-10-CM | POA: Insufficient documentation

## 2016-02-14 DIAGNOSIS — F17210 Nicotine dependence, cigarettes, uncomplicated: Secondary | ICD-10-CM | POA: Diagnosis not present

## 2016-02-14 DIAGNOSIS — S0190XA Unspecified open wound of unspecified part of head, initial encounter: Secondary | ICD-10-CM | POA: Diagnosis not present

## 2016-02-14 DIAGNOSIS — Y9339 Activity, other involving climbing, rappelling and jumping off: Secondary | ICD-10-CM | POA: Insufficient documentation

## 2016-02-14 DIAGNOSIS — Y999 Unspecified external cause status: Secondary | ICD-10-CM | POA: Insufficient documentation

## 2016-02-14 DIAGNOSIS — R69 Illness, unspecified: Secondary | ICD-10-CM | POA: Diagnosis not present

## 2016-02-14 MED ORDER — LIDOCAINE-EPINEPHRINE (PF) 2 %-1:200000 IJ SOLN
20.0000 mL | Freq: Once | INTRAMUSCULAR | Status: AC
  Administered 2016-02-14: 20 mL via INTRADERMAL
  Filled 2016-02-14: qty 20
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
## 2016-02-14 NOTE — ED Notes (Signed)
PA at bedside.

## 2016-02-14 NOTE — Discharge Instructions (Signed)
Please read and follow all provided instructions.  Your diagnoses today include:  1. Laceration of head, initial encounter   2. Minor head injury, initial encounter     Tests performed today include:  CT scan of your head and neck that did not show any serious injury.  Vital signs. See below for your results today.   Medications prescribed:   None  Take any prescribed medications only as directed.  Home care instructions:  Follow any educational materials contained in this packet.  BE VERY CAREFUL not to take multiple medicines containing Tylenol (also called acetaminophen). Doing so can lead to an overdose which can damage your liver and cause liver failure and possibly death.   Follow-up instructions: Please follow-up with your primary care provider in the next 3 days for further evaluation of your symptoms.   Return instructions:  SEEK IMMEDIATE MEDICAL ATTENTION IF:  There is confusion or drowsiness (although children frequently become drowsy after injury).   You cannot awaken the injured person.   You have more than one episode of vomiting.   You notice dizziness or unsteadiness which is getting worse, or inability to walk.   You have convulsions or unconsciousness.   You experience severe, persistent headaches not relieved by Tylenol.  You cannot use arms or legs normally.   There are changes in pupil sizes. (This is the black center in the colored part of the eye)   There is clear or bloody discharge from the nose or ears.   You have change in speech, vision, swallowing, or understanding.   Localized weakness, numbness, tingling, or change in bowel or bladder control.  You have any other emergent concerns.  Additional Information: You have had a head injury which does not appear to require admission at this time.  Your vital signs today were: BP 139/92 mmHg   Pulse 69   Temp(Src) 98.2 F (36.8 C)   Resp 20   SpO2 97% If your blood pressure (BP) was  elevated above 135/85 this visit, please have this repeated by your doctor within one month. --------------
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
## 2016-02-22 NOTE — Progress Notes (Signed)
Cardiology Office Note   Date:  02/24/2016   ID:  [name], DOB 08/14/1951, MRN [id number]  PCP:  [name], DO  Cardiologist:   Dietrich Pates, MD   F/U of CHF   History of Present Illness: [name] is a 65 y.o. male with a history of CP  I saw him last in April  Echo showed LVEF 20 to 25%  Pt went on to have heart cath  This showed normal coronary arteries  LVEF 35 to 40% LVEDP 12  Since seen his breathing is OK  No CP  No swelling        Outpatient Prescriptions Prior to Visit  Medication Sig Dispense Refill  . carvedilol (COREG) 3.125 MG tablet Take 1 tablet (3.125 mg total) by mouth 2 (two) times daily. 30 tablet 3  . hydrochlorothiazide (MICROZIDE) 12.5 MG capsule Take 1 capsule (12.5 mg total) by mouth daily. 30 capsule 6   No facility-administered medications prior to visit.     Allergies:   Review of patient's allergies indicates no known allergies.   Past Medical History  Diagnosis Date  . GERD (gastroesophageal reflux disease)   . Hypertension   . Mass     back of head    Past Surgical History  Procedure Laterality Date  . Knee surgery      left knee  . Cardiac catheterization N/A 01/23/2016    Procedure: Left Heart Cath and Coronary Angiography;  Surgeon: Peter M Swaziland, MD;  Location: [facility] INVASIVE CV LAB;  Service: Cardiovascular;  Laterality: N/A;     Social History:  The patient  reports that he has been smoking Cigarettes.  He has been smoking about 0.25 packs per day. He has never used smokeless tobacco. He reports that he drinks about 16.8 oz of alcohol per week. He reports that he does not use illicit drugs.   Family History:  The patient's family history includes Cancer in his mother.    ROS:  Please see the history of present illness. All other systems are reviewed and  Negative to the above problem except as noted.    PHYSICAL EXAM: VS:  BP 134/78 mmHg  Pulse 77  Ht  (1.854 m)  Wt 169 lb 9.6 oz (76.93 kg)  BMI  22.38 kg/m2  SpO2 96%  GEN: Well nourished, well developed, in no acute distress HEENT: normal Neck: no JVD, carotid bruits, or masses Cardiac: RRR; no murmurs, rubs, or gallops,no edema  Respiratory:  clear to auscultation bilaterally, normal work of breathing GI: soft, nontender, nondistended, + BS  No hepatomegaly  MS: no deformity Moving all extremities   Skin: warm and dry, no rash Neuro:  Strength and sensation are intact Psych: euthymic mood, full affect   EKG:  EKG is not ordered today.   Lipid Panel    Component Value Date/Time   CHOL 201* 10/21/2015 1112   CHOL 180 01/08/2008 2040   TRIG 87 10/21/2015 1112   HDL 100 10/21/2015 1112   HDL 78 01/08/2008 2040   CHOLHDL 2.0 10/21/2015 1112   CHOLHDL 2.3 Ratio 01/08/2008 2040   VLDL 7 01/08/2008 2040   LDLCALC 84 10/21/2015 1112   LDLCALC 95 01/08/2008 2040      Wt Readings from Last 3 Encounters:  02/24/16 169 lb 9.6 oz (76.93 kg)  01/23/16 180 lb (81.647 kg)  01/19/16 175 lb (79.379 kg)      ASSESSMENT AND PLAN:  1  Chronic systolic CHF  Volume status looks good  On coreg   Will start Entresto  Labs and BP in a few wks Reviewed with pt and familly  WIll need f/u echo later this year when on medical Rx    2  HCM  Lipids are good        Signed, Dietrich Pates, MD  02/24/2016 11:15 AM    [facility] [address] [place], [place], [place]  [address] Phone: [phone]; Fax: [phone]

## 2016-02-24 ENCOUNTER — Ambulatory Visit (INDEPENDENT_AMBULATORY_CARE_PROVIDER_SITE_OTHER): Payer: Medicare HMO | Admitting: Internal Medicine

## 2016-02-24 ENCOUNTER — Encounter: Payer: Self-pay | Admitting: Internal Medicine

## 2016-02-24 VITALS — BP 134/78 | HR 77 | Ht 73.0 in | Wt 169.6 lb

## 2016-02-24 DIAGNOSIS — I5022 Chronic systolic (congestive) heart failure: Secondary | ICD-10-CM | POA: Diagnosis not present

## 2016-02-24 DIAGNOSIS — I420 Dilated cardiomyopathy: Secondary | ICD-10-CM

## 2016-02-24 MED ORDER — CARVEDILOL 3.125 MG PO TABS: 3.1250 mg | ORAL_TABLET | Freq: Two times a day (BID) | ORAL | Status: DC

## 2016-02-24 MED ORDER — SACUBITRIL-VALSARTAN 24-26 MG PO TABS: 1.0000 | ORAL_TABLET | Freq: Two times a day (BID) | ORAL | Status: DC

## 2016-02-24 NOTE — Patient Instructions (Signed)
Your physician has recommended you make the following change in your medication:  1.) start Entresto 24/26 - take 1 tablet twice daily  Your physician recommends that you return for lab work in: 3 weeks (BMET)  Your physician recommends that you schedule a follow-up appointment in: 3 weeks for BP check

## 2016-02-28 ENCOUNTER — Telehealth: Payer: Self-pay

## 2016-02-28 NOTE — Telephone Encounter (Signed)
Prior auth for Entresto 24-26 submitted to [employer]. 

## 2016-03-02 ENCOUNTER — Telehealth: Payer: Self-pay

## 2016-03-02 NOTE — Telephone Encounter (Signed)
Received a denial letter from [employer], for [employer]. They claim he does not meet criteria for the drug. I called [employer] to start an expidited appeal, and gave all clinical infor needed. We should have an answer in 72 hours.

## 2016-03-03 ENCOUNTER — Emergency Department (HOSPITAL_COMMUNITY)
Admission: EM | Admit: 2016-03-03 | Discharge: 2016-03-05 | Disposition: A | Discharge status: Other Acute Inpatient Hospital | Payer: Medicare HMO | Attending: Emergency Medicine | Admitting: Emergency Medicine

## 2016-03-03 ENCOUNTER — Encounter (HOSPITAL_COMMUNITY): Payer: Self-pay | Admitting: Emergency Medicine

## 2016-03-03 DIAGNOSIS — F17210 Nicotine dependence, cigarettes, uncomplicated: Secondary | ICD-10-CM | POA: Diagnosis not present

## 2016-03-03 DIAGNOSIS — Z79899 Other long term (current) drug therapy: Secondary | ICD-10-CM | POA: Diagnosis not present

## 2016-03-03 DIAGNOSIS — F1420 Cocaine dependence, uncomplicated: Secondary | ICD-10-CM | POA: Diagnosis present

## 2016-03-03 DIAGNOSIS — F10120 Alcohol abuse with intoxication, uncomplicated: Secondary | ICD-10-CM | POA: Diagnosis not present

## 2016-03-03 DIAGNOSIS — T1491 Suicide attempt: Secondary | ICD-10-CM | POA: Diagnosis present

## 2016-03-03 DIAGNOSIS — F1020 Alcohol dependence, uncomplicated: Secondary | ICD-10-CM | POA: Diagnosis present

## 2016-03-03 DIAGNOSIS — I10 Essential (primary) hypertension: Secondary | ICD-10-CM | POA: Diagnosis not present

## 2016-03-03 DIAGNOSIS — F322 Major depressive disorder, single episode, severe without psychotic features: Secondary | ICD-10-CM | POA: Diagnosis present

## 2016-03-03 DIAGNOSIS — T1491XA Suicide attempt, initial encounter: Secondary | ICD-10-CM

## 2016-03-03 LAB — COMPREHENSIVE METABOLIC PANEL
ALT: 25 U/L (ref 17–63)
AST: 43 U/L — AB (ref 15–41)
Albumin: 4.6 g/dL (ref 3.5–5.0)
Alkaline Phosphatase: 41 U/L (ref 38–126)
Anion gap: 9 (ref 5–15)
BILIRUBIN TOTAL: 0.5 mg/dL (ref 0.3–1.2)
BUN: 21 mg/dL — ABNORMAL HIGH (ref 6–20)
CHLORIDE: 108 mmol/L (ref 101–111)
CO2: 23 mmol/L (ref 22–32)
CREATININE: 1.05 mg/dL (ref 0.61–1.24)
Calcium: 9.1 mg/dL (ref 8.9–10.3)
GFR calc Af Amer: >60 mL/min (ref >60)
GLUCOSE: 85 mg/dL (ref 65–99)
Potassium: 4.2 mmol/L (ref 3.5–5.1)
Sodium: 140 mmol/L (ref 135–145)
Total Protein: 8 g/dL (ref 6.5–8.1)

## 2016-03-03 LAB — SALICYLATE LEVEL

## 2016-03-03 LAB — CBC
HEMATOCRIT: 39.4 % (ref 39.0–52.0)
HEMOGLOBIN: 13.8 g/dL (ref 13.0–17.0)
MCH: 32.7 pg (ref 26.0–34.0)
MCHC: 35 g/dL (ref 30.0–36.0)
MCV: 93.4 fL (ref 78.0–100.0)
Platelets: 223 10*3/uL (ref 150–400)
RBC: 4.22 MIL/uL (ref 4.22–5.81)
RDW: 12.2 % (ref 11.5–15.5)
WBC: 5.9 10*3/uL (ref 4.0–10.5)

## 2016-03-03 LAB — ACETAMINOPHEN LEVEL: Acetaminophen (Tylenol), Serum: <10 ug/mL — ABNORMAL LOW (ref 10–30)

## 2016-03-03 LAB — ETHANOL: ALCOHOL ETHYL (B): 149 mg/dL — AB (ref <5)

## 2016-03-03 MED ORDER — HYDROCHLOROTHIAZIDE 12.5 MG PO CAPS
12.5000 mg | ORAL_CAPSULE | Freq: Every day | ORAL | Status: DC
  Administered 2016-03-04 – 2016-03-05 (×2): 12.5 mg via ORAL
  Filled 2016-03-03 (×2): qty 1

## 2016-03-03 MED ORDER — ZIPRASIDONE MESYLATE 20 MG IM SOLR: 20.0000 mg | Freq: Once | INTRAMUSCULAR | Status: DC

## 2016-03-03 MED ORDER — SACUBITRIL-VALSARTAN 24-26 MG PO TABS
1.0000 | ORAL_TABLET | Freq: Two times a day (BID) | ORAL | Status: DC
  Administered 2016-03-04 – 2016-03-05 (×4): 1 via ORAL
  Filled 2016-03-03 (×7): qty 1

## 2016-03-03 MED ORDER — ZIPRASIDONE MESYLATE 20 MG IM SOLR
20.0000 mg | Freq: Once | INTRAMUSCULAR | Status: AC
  Administered 2016-03-03: 20 mg via INTRAMUSCULAR

## 2016-03-03 MED ORDER — ZIPRASIDONE MESYLATE 20 MG IM SOLR
INTRAMUSCULAR | Status: AC
  Administered 2016-03-03: 20 mg via INTRAMUSCULAR
  Filled 2016-03-03: qty 20

## 2016-03-03 MED ORDER — STERILE WATER FOR INJECTION IJ SOLN
INTRAMUSCULAR | Status: AC
  Administered 2016-03-03: 23:00:00
  Filled 2016-03-03: qty 10

## 2016-03-03 MED ORDER — CARVEDILOL 3.125 MG PO TABS
3.1250 mg | ORAL_TABLET | Freq: Two times a day (BID) | ORAL | Status: DC
  Administered 2016-03-04 – 2016-03-05 (×4): 3.125 mg via ORAL
  Filled 2016-03-03 (×7): qty 1
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
## 2016-03-03 NOTE — ED Provider Notes (Addendum)
CSN: [id number]     Arrival date & time 03/03/16  2219 History   First MD Initiated Contact with Patient 03/03/16 2255     Chief Complaint  Patient presents with  . Suicidal     (Consider location/radiation/quality/duration/timing/severity/associated sxs/prior Treatment) HPI Level V caveat psychiatric complaint. [place] Police were called to hotel  patient is staying as patient expressed desire to kill himself tonight. Police advised him to come to the emergency department. Upon arrival here patient tried to wrap a cord around his neck. Patient states "I did not kill my wife. I want to kill myself." He admits to drinking alcohol earlier tonight. He denies other illicit drug use. Past Medical History  Diagnosis Date  . GERD (gastroesophageal reflux disease)   . Hypertension   . Mass     back of head   Past Surgical History  Procedure Laterality Date  . Knee surgery      left knee  . Cardiac catheterization N/A 01/23/2016    Procedure: Left Heart Cath and Coronary Angiography;  Surgeon: Peter M Swaziland, MD;  Location: [facility] INVASIVE CV LAB;  Service: Cardiovascular;  Laterality: N/A;   Family History  Problem Relation Age of Onset  . Cancer Mother    Social History  Substance Use Topics  . Smoking status: Current Every Day Smoker -- 0.25 packs/day    Types: Cigarettes  . Smokeless tobacco: Never Used  . Alcohol Use: 16.8 oz/week    28 Cans of beer per week    Review of Systems  Unable to perform ROS: Psychiatric disorder  Psychiatric/Behavioral: Positive for suicidal ideas.      Allergies  Review of patient's allergies indicates no known allergies.  Home Medications   Prior to Admission medications   Medication Sig Start Date End Date Taking? Authorizing Provider  carvedilol (COREG) 3.125 MG tablet Take 1 tablet (3.125 mg total) by mouth 2 (two) times daily. 02/24/16   Pricilla Riffle, MD  hydrochlorothiazide (MICROZIDE) 12.5 MG capsule Take 1 capsule (12.5 mg total) by  mouth daily. 09/02/15   Monica Carter, DO  sacubitril-valsartan (ENTRESTO) 24-26 MG Take 1 tablet by mouth 2 (two) times daily. 02/24/16   Pricilla Riffle, MD   BP 139/85 mmHg  Pulse 82  SpO2 97% Physical Exam  Constitutional:  Unkempt  HENT:  Head: Normocephalic and atraumatic.  Eyes: Conjunctivae are normal. Pupils are equal, round, and reactive to light.  Neck: Neck supple. No tracheal deviation present. No thyromegaly present.  Cardiovascular: Normal rate and regular rhythm.   No murmur heard. Pulmonary/Chest: Effort normal and breath sounds normal.  Abdominal: Soft. Bowel sounds are normal. He exhibits no distension. There is no tenderness.  Musculoskeletal: Normal range of motion. He exhibits no edema or tenderness.  Neurological: He is alert. Coordination normal.  Oriented to name and hospital does not know month. Moves all extremities follows simple commands  Skin: Skin is warm and dry. No rash noted.  Psychiatric: He has a normal mood and affect.  Nursing note and vitals reviewed.   ED Course  Procedures (including critical care time) Labs Review Labs Reviewed  COMPREHENSIVE METABOLIC PANEL - Abnormal; Notable for the following:    BUN 21 (*)    AST 43 (*)    All other components within normal limits  ETHANOL - Abnormal; Notable for the following:    Alcohol, Ethyl (B) 149 (*)    All other components within normal limits  ACETAMINOPHEN LEVEL - Abnormal; Notable for the following:  Acetaminophen (Tylenol), Serum <10 (*)    All other components within normal limits  SALICYLATE LEVEL  CBC  URINE RAPID DRUG SCREEN, HOSP PERFORMED    Imaging Review No results found. I have personally reviewed and evaluated these images and lab results as part of my medical decision-making.   EKG Interpretation   Date/Time:  Saturday March 03 2016 23:06:13 EDT Ventricular Rate:  79 PR Interval:  230 QRS Duration: 93 QT Interval:  394 QTC Calculation: 452 R Axis:   31 Text  Interpretation:  Sinus rhythm Prolonged PR interval Left ventricular  hypertrophy Anterior infarct, old SINCE LAST TRACING HEART RATE HAS  INCREASED Confirmed by Ethelda Chick  MD, Ulric Salzman [phone]) on 03/03/2016 11:52:25 PM     Patient required physical restraint as he was combative initially upon arrival. Geodon 20 mg IM ordered. 11:30 PM patient appears more calm.  12:50 AM patient is alert ambulatory without assistance. Results for orders placed or performed during the hospital encounter of 03/03/16  Comprehensive metabolic panel  Result Value Ref Range   Sodium 140 135 - 145 mmol/L   Potassium 4.2 3.5 - 5.1 mmol/L   Chloride 108 101 - 111 mmol/L   CO2 23 22 - 32 mmol/L   Glucose, Bld 85 65 - 99 mg/dL   BUN 21 (H) 6 - 20 mg/dL   Creatinine, Ser 4.43 0.61 - 1.24 mg/dL   Calcium 9.1 8.9 - 15.4 mg/dL   Total Protein 8.0 6.5 - 8.1 g/dL   Albumin 4.6 3.5 - 5.0 g/dL   AST 43 (H) 15 - 41 U/L   ALT 25 17 - 63 U/L   Alkaline Phosphatase 41 38 - 126 U/L   Total Bilirubin 0.5 0.3 - 1.2 mg/dL   GFR calc non Af Amer >60 >60 mL/min   GFR calc Af Amer >60 >60 mL/min   Anion gap 9 5 - 15  Ethanol  Result Value Ref Range   Alcohol, Ethyl (B) 149 (H) <5 mg/dL  Salicylate level  Result Value Ref Range   Salicylate Lvl <4.0 2.8 - 30.0 mg/dL  Acetaminophen level  Result Value Ref Range   Acetaminophen (Tylenol), Serum <10 (L) 10 - 30 ug/mL  cbc  Result Value Ref Range   WBC 5.9 4.0 - 10.5 K/uL   RBC 4.22 4.22 - 5.81 MIL/uL   Hemoglobin 13.8 13.0 - 17.0 g/dL   HCT 00.8 67.6 - 19.5 %   MCV 93.4 78.0 - 100.0 fL   MCH 32.7 26.0 - 34.0 pg   MCHC 35.0 30.0 - 36.0 g/dL   RDW 09.3 26.7 - 12.4 %   Platelets 223 150 - 400 K/uL   Ct Head Wo Contrast  02/14/2016  CLINICAL DATA:  65 year old male with fall EXAM: CT HEAD WITHOUT CONTRAST CT CERVICAL SPINE WITHOUT CONTRAST TECHNIQUE: Multidetector CT imaging of the head and cervical spine was performed following the standard protocol without intravenous  contrast. Multiplanar CT image reconstructions of the cervical spine were also generated. COMPARISON:  CT of the facial bone dated 02/06/2013 FINDINGS: CT HEAD FINDINGS The ventricles and sulci are appropriate in size for patient's age. Mild periventricular and deep white matter chronic microvascular ischemic changes noted. There is no acute intracranial hemorrhage. No mass effect or midline shift noted. There is diffuse mucoperiosteal thickening of paranasal sinuses with partial opacification of the ethmoid air cells. No air-fluid levels. The mastoid air cells are clear. The calvarium is intact. CT CERVICAL SPINE FINDINGS There is no acute fracture or  subluxation of the cervical spine.There is mild reversal of normal cervical lordosis which may be positional or due to muscle spasm. Multilevel degenerative changes of the cervical spine most prominent at C4-C6.The odontoid and spinous processes are intact.There is normal anatomic alignment of the C1-C2 lateral masses. The visualized soft tissues appear unremarkable. IMPRESSION: No acute intracranial hemorrhage. No acute/ traumatic cervical spine pathology. Electronically Signed   By: Elgie Collard M.D.   On: 02/14/2016 18:09   Ct Cervical Spine Wo Contrast  02/14/2016  CLINICAL DATA:  65 year old male with fall EXAM: CT HEAD WITHOUT CONTRAST CT CERVICAL SPINE WITHOUT CONTRAST TECHNIQUE: Multidetector CT imaging of the head and cervical spine was performed following the standard protocol without intravenous contrast. Multiplanar CT image reconstructions of the cervical spine were also generated. COMPARISON:  CT of the facial bone dated 02/06/2013 FINDINGS: CT HEAD FINDINGS The ventricles and sulci are appropriate in size for patient's age. Mild periventricular and deep white matter chronic microvascular ischemic changes noted. There is no acute intracranial hemorrhage. No mass effect or midline shift noted. There is diffuse mucoperiosteal thickening of paranasal  sinuses with partial opacification of the ethmoid air cells. No air-fluid levels. The mastoid air cells are clear. The calvarium is intact. CT CERVICAL SPINE FINDINGS There is no acute fracture or subluxation of the cervical spine.There is mild reversal of normal cervical lordosis which may be positional or due to muscle spasm. Multilevel degenerative changes of the cervical spine most prominent at C4-C6.The odontoid and spinous processes are intact.There is normal anatomic alignment of the C1-C2 lateral masses. The visualized soft tissues appear unremarkable. IMPRESSION: No acute intracranial hemorrhage. No acute/ traumatic cervical spine pathology. Electronically Signed   By: Elgie Collard M.D.   On: 02/14/2016 18:09    MDM  Patient involuntarily committed by me. Affidavit form and first exam form filled out by me  Final diagnoses:  None  At 12:50 AM patient is medically cleared for psychiatric evaluation Dx# 1 suicide attempt #2 alcohol intoxication  CRITICAL CARE Performed by: Doug Sou Total critical care time: 30 minutes Critical care time was exclusive of separately billable procedures and treating other patients. Critical care was necessary to treat or prevent imminent or life-threatening deterioration. Critical care was time spent personally by me on the following activities: development of treatment plan with patient and/or surrogate as well as nursing, discussions with consultants, evaluation of patient's response to treatment, examination of patient, obtaining history from patient or surrogate, ordering and performing treatments and interventions, ordering and review of laboratory studies, ordering and review of radiographic studies, pulse oximetry and re-evaluation of patient's condition.   Doug Sou, MD 03/04/16 1610  Doug Sou, MD 03/04/16 [id number]  Doug Sou, MD 03/05/16 5409

## 2016-03-04 ENCOUNTER — Encounter (HOSPITAL_COMMUNITY): Payer: Self-pay | Admitting: Emergency Medicine

## 2016-03-04 DIAGNOSIS — F322 Major depressive disorder, single episode, severe without psychotic features: Secondary | ICD-10-CM | POA: Diagnosis present

## 2016-03-04 DIAGNOSIS — F1020 Alcohol dependence, uncomplicated: Secondary | ICD-10-CM | POA: Diagnosis present

## 2016-03-04 DIAGNOSIS — F1420 Cocaine dependence, uncomplicated: Secondary | ICD-10-CM | POA: Diagnosis present

## 2016-03-04 DIAGNOSIS — R45851 Suicidal ideations: Secondary | ICD-10-CM

## 2016-03-04 LAB — RAPID URINE DRUG SCREEN, HOSP PERFORMED
AMPHETAMINES: NOT DETECTED
Barbiturates: NOT DETECTED
Benzodiazepines: NOT DETECTED
Cocaine: POSITIVE — AB
OPIATES: NOT DETECTED
TETRAHYDROCANNABINOL: NOT DETECTED

## 2016-03-04 MED ORDER — TRAZODONE HCL 100 MG PO TABS
100.0000 mg | ORAL_TABLET | Freq: Every day | ORAL | Status: DC
  Administered 2016-03-04: 100 mg via ORAL
  Filled 2016-03-04: qty 1

## 2016-03-04 MED ORDER — LORAZEPAM 1 MG PO TABS: 0.0000 mg | ORAL_TABLET | Freq: Two times a day (BID) | ORAL | Status: DC

## 2016-03-04 MED ORDER — LORAZEPAM 1 MG PO TABS
0.0000 mg | ORAL_TABLET | Freq: Four times a day (QID) | ORAL | Status: DC
  Administered 2016-03-04 (×2): 1 mg via ORAL
  Filled 2016-03-04 (×2): qty 1

## 2016-03-04 NOTE — ED Notes (Signed)
Pt. Transferred to [facility] from ED to room 37. Report from [facility]. Pt. Oriented to unit including Q15 minute rounds as well as the security cameras for their protection. Patient is alert and oriented, warm and dry in no acute distress. Patient denies SI, HI, and AVH. Pt. Encouraged to let me know if needs arise.

## 2016-03-04 NOTE — ED Notes (Signed)
Patient noted sleeping in room. No complaints, stable, in no acute distress. Q15 minute rounds and monitoring via Security Cameras to continue.  

## 2016-03-04 NOTE — ED Notes (Signed)
Patient in bed resting. Denies SI?I/AVH. States, "I don't want to hurt myself any more. I want to go home. When will I get to go home? I want to be out of here by Tuesday". Emotional encouragement given. Cup of tea given. Call light within reach. Environment secure.

## 2016-03-04 NOTE — Progress Notes (Signed)
Disposition CSW completed patient referrals to the following inpatient psych facilities:  [facility]  CSW will continue to follow patient for placement needs.  [facility] Behavioral Health Disposition CSW [phone]

## 2016-03-04 NOTE — BH Assessment (Signed)
Assessment completed. Consulted Christopher Morn, PA-C who recommended that pt be evaluated by the psychiatrist for final disposition. Informed Dr. Ethelda Chick of the recommendation.

## 2016-03-04 NOTE — Progress Notes (Signed)
[facility] staff called to accept patient for tomorrow at 1100. Accepting provider is Dr. Roselle Locus and our nursing staff can call report at [phone].  [facility] Behavioral Health Disposition CSW [phone]

## 2016-03-04 NOTE — ED Notes (Signed)
Report received from [facility]. Patient alert and oriented, warm and dry, in no acute distress. Patient denies SI, HI, AVH and pain. Patient made aware of Q15 minute rounds and security cameras for their safety. Patient instructed to come to me with needs or concerns.

## 2016-03-04 NOTE — ED Notes (Signed)
Pt. Changed into scrubs. Belongings put in bag, knife sent w/ security. Pt. wanded.

## 2016-03-04 NOTE — Progress Notes (Addendum)
Pt is awake and stated he feels better now. Pt shared that his wife died 7 months ago from a tumor and attimes he gets very depressed. Pt was given information on [facility] Grief Support groups. He does contract for safety and has good eye contact. 11:10am Pt put his call light on and stated, "I can't stay here. I have to get home . I have my wife's picture on my wall and I will be fine. I am leaving at 1pm today. " Pt was informed he can not leave right now. Pt expressed concern that he has a cardiology appointment on June 16th and does not want to miss it. Reassured the pt he will be out of [facility] in time for his appointment. Phoned social work to speak to the pt. (1:20pm )CIWA is a 5. Report to oncoming shift. (3pm)

## 2016-03-04 NOTE — Consult Note (Signed)
[facility] Consult   Reason for Consult:  Substance abuse with suicide attempt Referring Physician:  EDP Patient Identification: [name] MRN:  [id number] Principal Diagnosis: Severe major depression, single episode, without psychotic features [facility]) Diagnosis:   Patient Active Problem List   Diagnosis Date Noted  . Severe major depression, single episode, without psychotic features ([place]) [F32.2] 03/04/2016    Priority: High  . Alcohol use disorder, severe, dependence ([place]) [F10.20] 03/04/2016    Priority: High  . Cocaine use disorder, moderate, dependence ([place]) [F14.20] 03/04/2016    Priority: High  . Congestive dilated cardiomyopathy ([place]) [I42.0] 01/23/2016  . Chronic systolic CHF (congestive heart failure) ([place]) [I50.22] 01/23/2016  . Epigastric abdominal pain [R10.13] 08/20/2011  . Lichen simplex chronicus [L28.0] 06/13/2011  . Diarrhea [R19.7] 06/13/2011  . DERMATOPHYTOSIS OF SCALP AND BEARD [B35.0] 03/18/2008  . TOBACCO USER [F17.200] 01/02/2008  . ALCOHOL ABUSE, UNSPECIFIED [F10.10] 11/28/2006    Total Time spent with patient: 45 minutes  Subjective:   [name] is a 65 y.o. male patient admitted with suicide attempt.  HPI:  65 yo male who presented to the ED under the influence of alcohol and cocaine with suicidal ideations.  While in the ED, he took a cord and placed it around his neck in a suicide attempt.  Today, he is sober and denies suicidal/homicidal ideations and hallucinations.  However, due to his attempt yesterday he remains at high risk.  His wife died five months ago (appears to have guilt related to this) and he has two sons that are dead along with a daughter.  He does have one son who lives in [place] and the rest of his family lives in [place].  Currently he is living in a motel.  Minimizes his alcohol and drug abuse.  Pleasant and cooperative.  Past Psychiatric History: substance abuse  Risk to Self: Suicidal Ideation:  Yes-Currently Present Suicidal Intent: Yes-Currently Present Is patient at risk for suicide?: Yes Suicidal Plan?: Yes-Currently Present Specify Current Suicidal Plan: pt attempted to hang self in the ED.  Access to Means: Yes Specify Access to Suicidal Means: access to cords in the room What has been your use of drugs/alcohol within the last 12 months?: Alcohol and cocaine use reported  How many times?: 0 Other Self Harm Risks: Pt denies  Triggers for Past Attempts: None known (No previous attempts reported. ) Intentional Self Injurious Behavior: None Risk to Others: Homicidal Ideation: No Thoughts of Harm to Others: No Current Homicidal Intent: No Current Homicidal Plan: No Access to Homicidal Means: No Identified Victim: N/A History of harm to others?: No Assessment of Violence: None Noted Violent Behavior Description: No violent behaviors observed at this time. Pt is calm.  Does patient have access to weapons?: Yes (Comment) (Knivese ) Criminal Charges Pending?: No Does patient have a court date: No Prior Inpatient Therapy: Prior Inpatient Therapy: No Prior Outpatient Therapy: Prior Outpatient Therapy: No Does patient have an ACCT team?: No Does patient have Intensive In-House Services?  : No Does patient have [facility] services? : No Does patient have P4CC services?: No  Past Medical History:  Past Medical History  Diagnosis Date  . GERD (gastroesophageal reflux disease)   . Hypertension   . Mass     back of head    Past Surgical History  Procedure Laterality Date  . Knee surgery      left knee  . Cardiac catheterization N/A 01/23/2016    Procedure: Left Heart Cath and Coronary  Angiography;  Surgeon: Peter M Swaziland, MD;  Location: [facility] INVASIVE CV LAB;  Service: Cardiovascular;  Laterality: N/A;   Family History:  Family History  Problem Relation Age of Onset  . Cancer Mother    Family Psychiatric  History: none Social History:  History  Alcohol Use  . 16.8  oz/week  . 28 Cans of beer per week     History  Drug Use No    Comment: Has used in past    Social History   Social History  . Marital Status: Widowed    Spouse Name: N/A  . Number of Children: N/A  . Years of Education: N/A   Social History Main Topics  . Smoking status: Current Every Day Smoker -- 0.25 packs/day    Types: Cigarettes  . Smokeless tobacco: Never Used  . Alcohol Use: 16.8 oz/week    28 Cans of beer per week  . Drug Use: No     Comment: Has used in past  . Sexual Activity: No   Other Topics Concern  . None   Social History Narrative   Social History     Marital Status: Widowed             Spouse Name:                        Years of Education:                 Number of children:                 Occupational History-[employer]     None on file      Social History Main Topics     Smoking Status: None    Packs/Day:            Types:      Smokeless Status: Not on file                        Alcohol Use: Yes           16.8 oz/week        28 Cans of beer per week     Drug Use: No                  Comment: Has used in past     Sexual Activity: No                     Do you exercise? Yes - work   Do you live in a house, apartment, assisted living, [place], trailer. Yes   Do you have a living will? No   Do you have a DNR form.            Additional Social History:    Allergies:  No Known Allergies  Labs:  Results for orders placed or performed during the hospital encounter of 03/03/16 (from the past 48 hour(s))  Comprehensive metabolic panel     Status: Abnormal   Collection Time: 03/03/16 11:02 PM  Result Value Ref Range   Sodium 140 135 - 145 mmol/L   Potassium 4.2 3.5 - 5.1 mmol/L   Chloride 108 101 - 111 mmol/L   CO2 23 22 - 32 mmol/L   Glucose, Bld 85 65 - 99 mg/dL   BUN 21 (H) 6 - 20 mg/dL   Creatinine, Ser 9.42 0.61 - 1.24 mg/dL   Calcium 9.1 8.9 - 56.5 mg/dL  Total Protein 8.0 6.5 - 8.1 g/dL   Albumin 4.6 3.5 - 5.0 g/dL   AST 43 (H)  15 - 41 U/L   ALT 25 17 - 63 U/L   Alkaline Phosphatase 41 38 - 126 U/L   Total Bilirubin 0.5 0.3 - 1.2 mg/dL   GFR calc non Af Amer >60 >60 mL/min   GFR calc Af Amer >60 >60 mL/min    Comment: (NOTE) The eGFR has been calculated using the CKD EPI equation. This calculation has not been validated in all clinical situations. eGFR's persistently <60 mL/min signify possible Chronic Kidney Disease.    Anion gap 9 5 - 15  Ethanol     Status: Abnormal   Collection Time: 03/03/16 11:02 PM  Result Value Ref Range   Alcohol, Ethyl (B) 149 (H) <5 mg/dL    Comment:        LOWEST DETECTABLE LIMIT FOR SERUM ALCOHOL IS 5 mg/dL FOR MEDICAL PURPOSES ONLY   Salicylate level     Status: None   Collection Time: 03/03/16 11:02 PM  Result Value Ref Range   Salicylate Lvl <9.2 2.8 - 30.0 mg/dL  Acetaminophen level     Status: Abnormal   Collection Time: 03/03/16 11:02 PM  Result Value Ref Range   Acetaminophen (Tylenol), Serum <10 (L) 10 - 30 ug/mL    Comment:        THERAPEUTIC CONCENTRATIONS VARY SIGNIFICANTLY. A RANGE OF 10-30 ug/mL MAY BE AN EFFECTIVE CONCENTRATION FOR MANY PATIENTS. HOWEVER, SOME ARE BEST TREATED AT CONCENTRATIONS OUTSIDE THIS RANGE. ACETAMINOPHEN CONCENTRATIONS >150 ug/mL AT 4 HOURS AFTER INGESTION AND >50 ug/mL AT 12 HOURS AFTER INGESTION ARE OFTEN ASSOCIATED WITH TOXIC REACTIONS.   cbc     Status: None   Collection Time: 03/03/16 11:02 PM  Result Value Ref Range   WBC 5.9 4.0 - 10.5 K/uL   RBC 4.22 4.22 - 5.81 MIL/uL   Hemoglobin 13.8 13.0 - 17.0 g/dL   HCT 39.4 39.0 - 52.0 %   MCV 93.4 78.0 - 100.0 fL   MCH 32.7 26.0 - 34.0 pg   MCHC 35.0 30.0 - 36.0 g/dL   RDW 12.2 11.5 - 15.5 %   Platelets 223 150 - 400 K/uL  Rapid urine drug screen (hospital performed)     Status: Abnormal   Collection Time: 03/04/16 12:52 AM  Result Value Ref Range   Opiates NONE DETECTED NONE DETECTED   Cocaine POSITIVE (A) NONE DETECTED   Benzodiazepines NONE DETECTED NONE  DETECTED   Amphetamines NONE DETECTED NONE DETECTED   Tetrahydrocannabinol NONE DETECTED NONE DETECTED   Barbiturates NONE DETECTED NONE DETECTED    Comment:        DRUG SCREEN FOR MEDICAL PURPOSES ONLY.  IF CONFIRMATION IS NEEDED FOR ANY PURPOSE, NOTIFY LAB WITHIN 5 DAYS.        LOWEST DETECTABLE LIMITS FOR URINE DRUG SCREEN Drug Class       Cutoff (ng/mL) Amphetamine      1000 Barbiturate      200 Benzodiazepine   119 Tricyclics       417 Opiates          300 Cocaine          300 THC              50     Current Facility-Administered Medications  Medication Dose Route Frequency Provider Last Rate Last Dose  . carvedilol (COREG) tablet 3.125 mg  3.125 mg Oral BID Orlie Dakin, MD  3.125 mg at 03/04/16 0950  . hydrochlorothiazide (MICROZIDE) capsule 12.5 mg  12.5 mg Oral Daily Orlie Dakin, MD   12.5 mg at 03/04/16 0949  . LORazepam (ATIVAN) tablet 0-4 mg  0-4 mg Oral Q6H Orlie Dakin, MD   1 mg at 03/04/16 1149   Followed by  . [START ON 03/06/2016] LORazepam (ATIVAN) tablet 0-4 mg  0-4 mg Oral Q12H Orlie Dakin, MD      . sacubitril-valsartan (ENTRESTO) 24-26 mg per tablet  1 tablet Oral BID Orlie Dakin, MD   1 tablet at 03/04/16 0950  . traZODone (DESYREL) tablet 100 mg  100 mg Oral QHS Corena Pilgrim, MD       Current Outpatient Prescriptions  Medication Sig Dispense Refill  . carvedilol (COREG) 3.125 MG tablet Take 1 tablet (3.125 mg total) by mouth 2 (two) times daily. 60 tablet 11  . hydrochlorothiazide (MICROZIDE) 12.5 MG capsule Take 1 capsule (12.5 mg total) by mouth daily. 30 capsule 6  . sacubitril-valsartan (ENTRESTO) 24-26 MG Take 1 tablet by mouth 2 (two) times daily. 60 tablet 11    Musculoskeletal: Strength & Muscle Tone: within normal limits Gait & Station: normal Patient leans: N/A  Psychiatric Specialty Exam: Physical Exam  Constitutional: He is oriented to person, place, and time. He appears well-developed and well-nourished.  HENT:   Head: Normocephalic.  Neck: Normal range of motion.  Respiratory: Effort normal.  Musculoskeletal: Normal range of motion.  Neurological: He is alert and oriented to person, place, and time.  Skin: Skin is warm and dry.  Psychiatric: His speech is normal and behavior is normal. Cognition and memory are normal. He expresses impulsivity. He exhibits a depressed mood. He expresses suicidal ideation. He expresses suicidal plans.    Review of Systems  Constitutional: Negative.   HENT: Negative.   Eyes: Negative.   Respiratory: Negative.   Cardiovascular: Negative.   Gastrointestinal: Negative.   Genitourinary: Negative.   Musculoskeletal: Negative.   Skin: Negative.   Neurological: Negative.   Endo/Heme/Allergies: Negative.   Psychiatric/Behavioral: Positive for depression, suicidal ideas and substance abuse.    Blood pressure 131/69, pulse 74, temperature 98 F (36.7 C), temperature source Oral, resp. rate 16, SpO2 99 %.There is no weight on file to calculate BMI.  General Appearance: Casual  Eye Contact:  Fair  Speech:  Normal Rate  Volume:  Normal  Mood:  Depressed  Affect:  Congruent  Thought Process:  Coherent  Orientation:  Full (Time, Place, and Person)  Thought Content:  WDL  Suicidal Thoughts:  Yes.  with intent/plan  Homicidal Thoughts:  No  Memory:  Immediate;   Fair Recent;   Fair Remote;   Fair  Judgement:  Poor  Insight:  Lacking  Psychomotor Activity:  Decreased  Concentration:  Concentration: Fair and Attention Span: Fair  Recall:  [employer] of Knowledge:  Fair  Language:  Good  Akathisia:  No  Handed:  Right  AIMS (if indicated):     Assets:  Leisure Time Resilience  ADL's:  Intact  Cognition:  WNL  Sleep:        Treatment Plan Summary: Daily contact with patient to assess and evaluate symptoms and progress in treatment, Medication management and Plan major depressive disorder, single episode, severe without psychosis:  -Crisis  stabilization -Medication management:  Continue medical medications along with Alcohol ativan detox protocol and Trazodone 100 mg at bedtime for sleep -Individual and substance abuse counseling  Disposition: Recommend psychiatric Inpatient admission when medically cleared.  Waylan Boga, NP 03/04/2016 12:13 PM Patient seen face-to-face for psychiatric evaluation, chart reviewed and case discussed with the physician extender and developed treatment plan. Reviewed the information documented and agree with the treatment plan. Corena Pilgrim, MD

## 2016-03-04 NOTE — BH Assessment (Signed)
Tele Assessment Note   [name] is an 65 y.o. male presenting to [facility] after [employer] was called to his motel. Pt stated "I didn't kill my wife she died on her own". Pt is endorsing suicidal ideations and it has been documented that pt attempted to hang himself while in the emergency room. Pt did not report any previous suicide attempts or self-injurious behaviors. Pt denies HI and stated "I didn't kill my wife" when this writer inquired about his homicidal thoughts. Pt denies AVH at this time and stated "I wonder about that sometimes. Pt reported alcohol and cocaine use. Pt did not report any currently or past mental health treatment at this time.  Pt was drowsy during this assessment and primarily provided nonverbal responses. Pt's UDS is positive for cocaine and his BAL is 149. AM psych eval is recommended for final disposition.   Diagnosis:  Substance induced depressive disorder (alcohol/cocaine)    Past Medical History:  Past Medical History  Diagnosis Date  . GERD (gastroesophageal reflux disease)   . Hypertension   . Mass     back of head    Past Surgical History  Procedure Laterality Date  . Knee surgery      left knee  . Cardiac catheterization N/A 01/23/2016    Procedure: Left Heart Cath and Coronary Angiography;  Surgeon: Peter M Swaziland, MD;  Location: [facility] INVASIVE CV LAB;  Service: Cardiovascular;  Laterality: N/A;    Family History:  Family History  Problem Relation Age of Onset  . Cancer Mother     Social History:  reports that he has been smoking Cigarettes.  He has been smoking about 0.25 packs per day. He has never used smokeless tobacco. He reports that he drinks about 16.8 oz of alcohol per week. He reports that he does not use illicit drugs.  Additional Social History:  Alcohol / Drug Use History of alcohol / drug use?: Yes Substance #1 Name of Substance 1: Alcohol  1 - Age of First Use: unknown  1 - Amount (size/oz): unknown  1 - Frequency: unknown  1 -  Duration: ongoing  1 - Last Use / Amount: 03-03-16 BAL-149 Substance #2 Name of Substance 2: Cocaine  2 - Age of First Use: unknown  2 - Amount (size/oz): unknown  2 - Frequency: unknown  2 - Duration: ongoing  2 - Last Use / Amount: unknown UDS is positive.   CIWA: CIWA-Ar BP: 129/82 mmHg Pulse Rate: 71 COWS:    PATIENT STRENGTHS: (choose at least two) Average or above average intelligence Financial means  Allergies: No Known Allergies  Home Medications:  (Not in a hospital admission)  OB/GYN Status:  No LMP for male patient.  General Assessment Data Location of Assessment: [facility] ED TTS Assessment: In system Is this a Tele or Face-to-Face Assessment?: Face-to-Face Is this an Initial Assessment or a Re-assessment for this encounter?: Initial Assessment Marital status: Widowed Living Arrangements: Alone Energy manager ) Can pt return to current living arrangement?: Yes Admission Status: Involuntary Is patient capable of signing voluntary admission?: Yes Referral Source: Self/Family/Friend Insurance type: [employer]     Crisis Care Plan Living Arrangements: Alone Energy manager ) Name of Psychiatrist: None  Name of Therapist: None   Education Status Is patient currently in school?: No  Risk to self with the past 6 months Suicidal Ideation: Yes-Currently Present Has patient been a risk to self within the past 6 months prior to admission? : No Suicidal Intent: Yes-Currently Present Has patient  had any suicidal intent within the past 6 months prior to admission? : No Is patient at risk for suicide?: Yes Suicidal Plan?: Yes-Currently Present Has patient had any suicidal plan within the past 6 months prior to admission? : No Specify Current Suicidal Plan: pt attempted to hang self in the ED.  Access to Means: Yes Specify Access to Suicidal Means: access to cords in the room What has been your use of drugs/alcohol within the last 12 months?: Alcohol and cocaine use reported   Previous Attempts/Gestures: No How many times?: 0 Other Self Harm Risks: Pt denies  Triggers for Past Attempts: None known (No previous attempts reported. ) Intentional Self Injurious Behavior: None Family Suicide History: No Recent stressful life event(s):  (Unable to assess) Persecutory voices/beliefs?: No Depression: Yes Depression Symptoms: Tearfulness, Feeling worthless/self pity, Guilt, Loss of interest in usual pleasures, Feeling angry/irritable Substance abuse history and/or treatment for substance abuse?: Yes Suicide prevention information given to non-admitted patients: Not applicable  Risk to Others within the past 6 months Homicidal Ideation: No Does patient have any lifetime risk of violence toward others beyond the six months prior to admission? : No Thoughts of Harm to Others: No Current Homicidal Intent: No Current Homicidal Plan: No Access to Homicidal Means: No Identified Victim: N/A History of harm to others?: No Assessment of Violence: None Noted Violent Behavior Description: No violent behaviors observed at this time. Pt is calm.  Does patient have access to weapons?: Yes (Comment) (Knivese ) Criminal Charges Pending?: No Does patient have a court date: No Is patient on probation?: No  Psychosis Hallucinations: None noted Delusions: None noted  Mental Status Report Appearance/Hygiene: In scrubs Eye Contact: Poor Motor Activity: Unremarkable Speech: Soft Level of Consciousness: Drowsy Mood: Euthymic Affect: Appropriate to circumstance Anxiety Level: None Thought Processes: Coherent, Relevant Judgement: Impaired Orientation: Unable to assess Obsessive Compulsive Thoughts/Behaviors: Unable to Assess  Cognitive Functioning Concentration: Unable to Assess Memory: Unable to Assess IQ: Average Insight: Poor Impulse Control: Poor Appetite: Good Weight Loss: 0 Weight Gain: 0 Sleep: Unable to Assess Vegetative Symptoms: Unable to  Assess  ADLScreening [facility] Assessment Services) Patient's cognitive ability adequate to safely complete daily activities?: Yes Patient able to express need for assistance with ADLs?: Yes Independently performs ADLs?: Yes (appropriate for developmental age)  Prior Inpatient Therapy Prior Inpatient Therapy: No  Prior Outpatient Therapy Prior Outpatient Therapy: No Does patient have an ACCT team?: No Does patient have Intensive In-House Services?  : No Does patient have [facility] services? : No Does patient have P4CC services?: No  ADL Screening (condition at time of admission) Patient's cognitive ability adequate to safely complete daily activities?: Yes Patient able to express need for assistance with ADLs?: Yes Independently performs ADLs?: Yes (appropriate for developmental age)       Abuse/Neglect Assessment (Assessment to be complete while patient is alone) Physical Abuse: Denies Verbal Abuse: Denies Sexual Abuse: Denies Exploitation of patient/patient's resources: Denies Self-Neglect: Denies     Merchant navy officer (For Healthcare) Does patient have an advance directive?: No    Additional Information 1:1 In Past 12 Months?: Yes CIRT Risk: No Elopement Risk: No Does patient have medical clearance?: Yes     Disposition:  Disposition Initial Assessment Completed for this Encounter: Yes Disposition of Patient: Other dispositions Other disposition(s): Other (Comment) (AM Psych eval )  Demarea Lorey S 03/04/2016 1:33 AM

## 2016-03-04 NOTE — ED Notes (Signed)
Patient walked to the bathroom with [employer] without any assistance without any difficulty.

## 2016-03-05 NOTE — ED Notes (Signed)
Patient noted sleeping in room. No complaints, stable, in no acute distress. Q15 minute rounds and monitoring via Security Cameras to continue.  

## 2016-03-05 NOTE — ED Notes (Addendum)
Patient noted sleeping in room. No complaints, stable, in no acute distress. Q15 minute rounds and monitoring via Security Cameras to continue.  

## 2016-03-05 NOTE — ED Notes (Signed)
Patient noted sleeping in room. No complaints, stable, in no acute distress. Sitter at bedside.  

## 2016-03-05 NOTE — ED Notes (Signed)
Pt. Property in locker # 26.

## 2016-03-05 NOTE — ED Notes (Signed)
Patient noted sleeping in room. No complaints, stable, in no acute distress. Sitter at bedside.

## 2016-03-05 NOTE — ED Notes (Signed)
Bed: WA26 Expected date:  Expected time:  Means of arrival:  Comments: 

## 2016-03-05 NOTE — ED Notes (Signed)
Patient discharged to [facility].  Left the unit ambulatory with [facility].  All belongings given to the transporter.

## 2016-03-06 DIAGNOSIS — I5040 Unspecified combined systolic (congestive) and diastolic (congestive) heart failure: Secondary | ICD-10-CM | POA: Diagnosis not present

## 2016-03-06 DIAGNOSIS — I10 Essential (primary) hypertension: Secondary | ICD-10-CM | POA: Diagnosis not present

## 2016-03-15 ENCOUNTER — Telehealth: Payer: Self-pay

## 2016-03-15 NOTE — Telephone Encounter (Signed)
Letter received from [employer] dated 03/02/2016, in favor of appeal for [facility] 24-26. Appeal # [id number]. Good through 09/30/2016.

## 2016-03-16 ENCOUNTER — Ambulatory Visit (INDEPENDENT_AMBULATORY_CARE_PROVIDER_SITE_OTHER): Payer: Medicare HMO | Admitting: Pharmacist

## 2016-03-16 ENCOUNTER — Other Ambulatory Visit (INDEPENDENT_AMBULATORY_CARE_PROVIDER_SITE_OTHER): Payer: Medicare HMO | Admitting: *Deleted

## 2016-03-16 VITALS — BP 98/68 | HR 67 | Wt 171.8 lb

## 2016-03-16 DIAGNOSIS — I5022 Chronic systolic (congestive) heart failure: Secondary | ICD-10-CM

## 2016-03-16 DIAGNOSIS — I420 Dilated cardiomyopathy: Secondary | ICD-10-CM

## 2016-03-16 DIAGNOSIS — I428 Other cardiomyopathies: Secondary | ICD-10-CM | POA: Diagnosis not present

## 2016-03-16 DIAGNOSIS — I509 Heart failure, unspecified: Secondary | ICD-10-CM | POA: Diagnosis not present

## 2016-03-16 LAB — BASIC METABOLIC PANEL
BUN: 24 mg/dL (ref 7–25)
CO2: 29 mmol/L (ref 20–31)
Calcium: 8.9 mg/dL (ref 8.6–10.3)
Chloride: 106 mmol/L (ref 98–110)
Creat: 1 mg/dL (ref 0.70–1.25)
GLUCOSE: 116 mg/dL — AB (ref 65–99)
POTASSIUM: 4.1 mmol/L (ref 3.5–5.3)
SODIUM: 139 mmol/L (ref 135–146)

## 2016-03-16 NOTE — Progress Notes (Signed)
Patient ID: [name]                 DOB: 08/18/1951                      MRN: [id number]     HPI: [name] is a 65 y.o. male referred by Dr. Tenny Craw to HTN clinic for titration of HF medications. PMH significant for HF with EF 20-25%, alcohol and cocaine abuse, and recent suicide attempt 2 weeks ago. He was started on Entresto ~3 weeks ago and presents for BP check and BMET.  Has had some dizziness a few days ago while he was sitting at home. Denies blurred vision and headaches. Not checking his blood pressure at home.  Current HTN meds: carvedilol 3.125mg  BID, HCTZ 12.5mg  daily, Entresto 24-26mg  BID BP goal: < 140/93mmHg; goal to avoid hypotension while optimizing HF regimen  Family History: Mother with history of cancer.  Social History: Pt is widowed, current smoker 1/4 [employer], drinks 28 cans of beer per week, history of cocaine abuse.   Wt Readings from Last 3 Encounters:  02/24/16 169 lb 9.6 oz (76.93 kg)  01/23/16 180 lb (81.647 kg)  01/19/16 175 lb (79.379 kg)   BP Readings from Last 3 Encounters:  03/05/16 102/60  02/24/16 134/78  02/14/16 139/92   Pulse Readings from Last 3 Encounters:  03/05/16 65  02/24/16 77  02/14/16 69    Renal function: CrCl cannot be calculated (Unknown ideal weight.).  Past Medical History  Diagnosis Date  . GERD (gastroesophageal reflux disease)   . Hypertension   . Mass     back of head    Current Outpatient Prescriptions on File Prior to Visit  Medication Sig Dispense Refill  . carvedilol (COREG) 3.125 MG tablet Take 1 tablet (3.125 mg total) by mouth 2 (two) times daily. 60 tablet 11  . hydrochlorothiazide (MICROZIDE) 12.5 MG capsule Take 1 capsule (12.5 mg total) by mouth daily. 30 capsule 6  . sacubitril-valsartan (ENTRESTO) 24-26 MG Take 1 tablet by mouth 2 (two) times daily. 60 tablet 11   No current facility-administered medications on file prior to visit.    No Known Allergies   Assessment/Plan:  1. HFrEF/HTN  med titration: BP down to 98/53mmHg since starting Entresto at the lowest dose 3 weeks ago. Will stop HCTZ to help improve BP and continue Entresto and carvedilol for mortality benefit in HF. Recheck BP in 2 weeks. Ideally would like to add spironolactone in the future if BP tolerates it. BMET check today per Dr. Tenny Craw.   [name], PharmD, CPP [facility] [address] [place], [place] [address] Phone: [phone]; Fax: [phone] 03/16/2016 10:37 AM

## 2016-03-16 NOTE — Addendum Note (Signed)
Addended by: Tonita Phoenix on: 03/16/2016 10:17 AM   Modules accepted: Orders

## 2016-03-16 NOTE — Patient Instructions (Signed)
STOP taking your hydrochlorothiazide Continue taking Entresto and carvedilol twice a day  Come in for another blood pressure check in 2 weeks.

## 2016-03-30 ENCOUNTER — Ambulatory Visit (INDEPENDENT_AMBULATORY_CARE_PROVIDER_SITE_OTHER): Payer: Medicare HMO | Admitting: Pharmacist

## 2016-03-30 VITALS — BP 102/66 | HR 62

## 2016-03-30 DIAGNOSIS — I5022 Chronic systolic (congestive) heart failure: Secondary | ICD-10-CM | POA: Diagnosis not present

## 2016-03-30 NOTE — Progress Notes (Signed)
Patient ID: [name]                 DOB: 1951/03/31                      MRN: [id number]     HPI: [name] is a 65 y.o. male referred by Dr. Tenny Craw to HTN clinic for titration of HF medications. PMH significant for HF with EF 20-25%, alcohol and cocaine abuse, and recent suicide attempt a month ago. HCTZ was discontinued at last pharmacist visit d/t hypotension.   Pt reports feeling well since last visit. Denies blurred vision and headaches. Not regularly checking his blood pressure at home. Reports his girlfriend checks it occasionally and it has been "up and down."  BP 102/66, HR 62 in clinic today.  Current HTN meds: carvedilol 3.125mg  BID, Entresto 24-26mg  BID Previously tried: HCTZ - discontinued at last pharm visit d/t hypotension BP goal: < 140/54mmHg; goal to avoid hypotension while optimizing HF regimen  Family History: Mother with history of cancer.  Social History: Pt is widowed, current smoker 1/4 PPD, drinks 28 cans of beer per week, history of cocaine abuse.  Wt Readings from Last 3 Encounters:  03/16/16 171 lb 12 oz (77.905 kg)  02/24/16 169 lb 9.6 oz (76.93 kg)  01/23/16 180 lb (81.647 kg)   BP Readings from Last 3 Encounters:  03/16/16 98/68  03/05/16 102/60  02/24/16 134/78   Pulse Readings from Last 3 Encounters:  03/16/16 67  03/05/16 65  02/24/16 77    Renal function: Estimated Creatinine Clearance: 82.2 mL/min (by C-G formula based on Cr of 1).  Past Medical History  Diagnosis Date  . GERD (gastroesophageal reflux disease)   . Hypertension   . Mass     back of head    Current Outpatient Prescriptions on File Prior to Visit  Medication Sig Dispense Refill  . carvedilol (COREG) 3.125 MG tablet Take 1 tablet (3.125 mg total) by mouth 2 (two) times daily. 60 tablet 11  . sacubitril-valsartan (ENTRESTO) 24-26 MG Take 1 tablet by mouth 2 (two) times daily. 60 tablet 11  . traZODone (DESYREL) 100 MG tablet Take 100 mg by mouth at bedtime.      No current facility-administered medications on file prior to visit.    No Known Allergies   Assessment/Plan:  1. HF med titration - BP slightly improved since discontinuing HCTZ 2 weeks ago. Taking lowest dose carvedilol 3.125mg  BID and Entresto 24/26 BID for HFrEF.BP on the low side but pt reports tolerating medications well and is asymptomatic. Will have him continue current therapies and advised him to call clinic with signs of dizziness.   Aneri Slagel E. Axavier Pressley, PharmD, CPP [facility] [address], [place], [place] [address] Phone: [phone]; Fax: [phone] 03/30/2016 12:15 PM

## 2016-04-05 ENCOUNTER — Telehealth: Payer: Self-pay | Admitting: Internal Medicine

## 2016-04-05 NOTE — Telephone Encounter (Signed)
Left message for patient to call back  

## 2016-04-05 NOTE — Telephone Encounter (Signed)
New Message  Pt Aunt calling to speak w/ rN about possible alternatives to a few of his medications. Please call back and discuss.

## 2016-04-12 NOTE — Telephone Encounter (Signed)
Spoke with patient's aunt Towanda Malkin. She thought patient told her his BPs have been high and she wondered if he should restart HCTZ. Reviewed last 2 clinic notes with Aundra Millet, PharmD and blood pressures those days were low.  She also states that patient has not been taking entresto since he ran out of samples because of the cost.  His cost is over $200 for 30 days. Towanda Malkin will check with [facility], the preferred pharmacy and see if cost is less.   I advised I would inquire about patient assistance with Candy Sledge, LPN to see if cost can be lowered.  She is appreciative for the information and will call back with update on [facility] price.

## 2016-04-12 NOTE — Telephone Encounter (Signed)
Follow Up: ° ° ° ° °Returning your call from last week. °

## 2016-04-13 ENCOUNTER — Other Ambulatory Visit: Payer: Self-pay | Admitting: Internal Medicine

## 2016-04-13 NOTE — Telephone Encounter (Signed)
Samples of Entresto 24-26, 3 bottles provided. Also, phone # for [employer] givenI i can also try to do a Tier exception. Towanda Malkin notified of this. If all else fails, there is [employer].

## 2016-04-16 ENCOUNTER — Telehealth: Payer: Self-pay | Admitting: Internal Medicine

## 2016-04-16 NOTE — Telephone Encounter (Signed)
Trazadone refills should be followed up by PCP.

## 2016-04-16 NOTE — Telephone Encounter (Addendum)
New message       *STAT* If patient is at the pharmacy, call can be transferred to refill team.   1. Which medications need to be refilled? (please list name of each medication and dose if known) trazodane  100mg  2. Which pharmacy/location (including street and city if local pharmacy) is medication to be sent to? [facility] at [address]. Do they need a 30 day or 90 day supply? 30

## 2016-04-17 NOTE — Telephone Encounter (Signed)
called to inform [name] that the trazodone will have to be fill by PCP. called and informed her and she expressed understanding.

## 2016-04-23 ENCOUNTER — Other Ambulatory Visit: Payer: Self-pay | Admitting: *Deleted

## 2016-04-23 NOTE — Telephone Encounter (Signed)
I'm not sure who gave him this.  When I'd seen him for an acute visit, he was only taking a diuretic.  He hasn't been seen for several months so I recommend he be scheduled for Dr. Montez Morita to see him so he can make the request for the trazodone.

## 2016-04-23 NOTE — Telephone Encounter (Signed)
Patient caregiver, Corrie Dandy called and stated that patient needs a refill on his Trazodone. Dr. Charlott Rakes office prescribed this but will not refill it and told patient it would have to go through the PCP office. Is this ok to refill? Please Advise.

## 2016-04-24 NOTE — Telephone Encounter (Signed)
Spoke with [name] will call patient and see what day he is available to come in within the next week for acute concern to address medications, Patient will keep pending appointment for 05/25/16 as well.   Corrie Dandy will call back today to schedule appointment

## 2016-04-27 ENCOUNTER — Other Ambulatory Visit: Payer: Self-pay | Admitting: *Deleted

## 2016-04-27 ENCOUNTER — Telehealth: Payer: Self-pay | Admitting: *Deleted

## 2016-04-27 ENCOUNTER — Encounter: Payer: Medicare HMO | Admitting: Internal Medicine

## 2016-04-27 MED ORDER — TRAZODONE HCL 100 MG PO TABS: 100.0000 mg | ORAL_TABLET | Freq: Every day | ORAL | 1 refills | Status: DC

## 2016-04-27 NOTE — Telephone Encounter (Signed)
.  left message to have patient's aunt return my call.   Calling pt's aunt to advise we have samples of Entresto to give until his appt on 05/2516 with Dr. Montez Morita.

## 2016-04-28 NOTE — Progress Notes (Signed)
This encounter was created in error - please disregard.

## 2016-04-30 ENCOUNTER — Other Ambulatory Visit: Payer: Self-pay | Admitting: *Deleted

## 2016-04-30 MED ORDER — CARVEDILOL 3.125 MG PO TABS: 3.1250 mg | ORAL_TABLET | Freq: Two times a day (BID) | ORAL | 5 refills | Status: DC

## 2016-04-30 NOTE — Telephone Encounter (Signed)
Corrie Dandy, caregiver requested. Faxed to pharmacy.

## 2016-05-03 ENCOUNTER — Ambulatory Visit (INDEPENDENT_AMBULATORY_CARE_PROVIDER_SITE_OTHER): Payer: Medicare HMO | Admitting: Nurse Practitioner

## 2016-05-03 ENCOUNTER — Encounter: Payer: Self-pay | Admitting: Nurse Practitioner

## 2016-05-03 VITALS — BP 132/78 | HR 69 | Temp 97.9°F | Resp 17 | Ht 73.0 in | Wt 168.2 lb

## 2016-05-03 DIAGNOSIS — R69 Illness, unspecified: Secondary | ICD-10-CM | POA: Diagnosis not present

## 2016-05-03 DIAGNOSIS — R0781 Pleurodynia: Secondary | ICD-10-CM | POA: Diagnosis not present

## 2016-05-03 DIAGNOSIS — F1010 Alcohol abuse, uncomplicated: Secondary | ICD-10-CM

## 2016-05-03 DIAGNOSIS — W1809XA Striking against other object with subsequent fall, initial encounter: Secondary | ICD-10-CM
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
## 2016-05-03 NOTE — Patient Instructions (Addendum)
Go to AA meeting To cut back on drinking and ideally to set a goal to QUIT Cont to take tylenol 500 mg 1 tablet every 6 hours as needed for pain.  Make sure you are taking time to do DEEP Breathing exercise   Keep follow up with Dr Montez Morita  Call for increased pain, fever, cough, congestion, shortness of breath, bruising

## 2016-05-25 ENCOUNTER — Ambulatory Visit (INDEPENDENT_AMBULATORY_CARE_PROVIDER_SITE_OTHER): Payer: Medicare HMO | Admitting: Internal Medicine

## 2016-05-25 ENCOUNTER — Encounter: Payer: Self-pay | Admitting: Internal Medicine

## 2016-05-25 VITALS — BP 110/70 | HR 70 | Temp 97.8°F | Wt 170.0 lb

## 2016-05-25 DIAGNOSIS — Z23 Encounter for immunization: Secondary | ICD-10-CM | POA: Diagnosis not present

## 2016-05-25 DIAGNOSIS — F1020 Alcohol dependence, uncomplicated: Secondary | ICD-10-CM

## 2016-05-25 DIAGNOSIS — I420 Dilated cardiomyopathy: Secondary | ICD-10-CM

## 2016-05-25 DIAGNOSIS — I5022 Chronic systolic (congestive) heart failure: Secondary | ICD-10-CM | POA: Diagnosis not present

## 2016-05-25 DIAGNOSIS — F322 Major depressive disorder, single episode, severe without psychotic features: Secondary | ICD-10-CM | POA: Diagnosis not present

## 2016-05-25 DIAGNOSIS — Z1211 Encounter for screening for malignant neoplasm of colon: Secondary | ICD-10-CM

## 2016-05-25 DIAGNOSIS — J302 Other seasonal allergic rhinitis: Secondary | ICD-10-CM

## 2016-05-25 DIAGNOSIS — R69 Illness, unspecified: Secondary | ICD-10-CM | POA: Diagnosis not present

## 2016-05-25 NOTE — Progress Notes (Signed)
Patient ID: [name], male   DOB: 03/26/1951, 65 y.o.   MRN: [id number]    Location:  [facility] Place of Service: OFFICE  Chief Complaint  Patient presents with  . Follow-up    Follow up    HPI:  65 yo male seen today for f/u. He has been hospitalized since I last saw him. He was tx for HF and is now on entresto and coreg. Aunt reports tat medicine is very expensive and they ave been getting samples of med from cardiology. He also was seen in the ED for laceration to the scalp after fall while intoxicated in May and then again in June for depression.   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 Aunt is present and hx obtained from her. He never saw GI for screening colonoscopy due to Aunt has not had a chance to set up appt.  HTN/chronic systolic HF - stable on entresto and coreg. He reports no dizziness, CP or SOB. Followed by cardio. LHC revealed mod-sev LV dysfunction with EF 35/40% and global hypokinesis of LV  MDD - mood stable on trazodone  GERD - takes Tums prn  Etoh abuse - ongoing. He drinks 1 beer 40oz most days unless "someone makes me mad" then he drinks 2 of the 40oz. He is not interested in quitting  Hx cocaine use - denies any recent use. He tested (+) cocaine in [facility] in June 2017  Past Medical History:  Diagnosis Date  . GERD (gastroesophageal reflux disease)   . Hypertension   . Mass    back of head    Past Surgical History:  Procedure Laterality Date  . CARDIAC CATHETERIZATION N/A 01/23/2016   Procedure: Left Heart Cath and Coronary Angiography;  Surgeon: Peter M Martinique, MD;  Location: [facility];  Service: Cardiovascular;  Laterality: N/A;  . KNEE SURGERY     left knee    Patient Care Team: Gildardo Cranker, DO as PCP - General (Internal Medicine)  Social History   Social History  . Marital status: Widowed    Spouse name: N/A  . Number of children: N/A  . Years  of education: N/A   Occupational History  . Not on file.   Social History Main Topics  . Smoking status: Former Smoker    Packs/day: 0.25    Types: Cigarettes    Quit date: 06/30/2014  . Smokeless tobacco: Never Used  . Alcohol use Yes     Comment: Pt states he drinks a 40 oz Clinton Gallant every other day.   . Drug use: No     Comment: Has used in past  . Sexual activity: No   Other Topics Concern  . Not on file   Social History Narrative   Social History     Marital Status: Widowed             Spouse Name:                        Years of Education:                 Number of children:                 Occupational History-[employer]     None on file      Social History Main Topics     Smoking Status: None  Packs/Day:            Types:      Smokeless Status: Not on file                        Alcohol Use: Yes           16.8 oz/week        28 Cans of beer per week     Drug Use: No                  Comment: Has used in past     Sexual Activity: No                     Do you exercise? Yes - work   Do you live in a house, apartment, assisted living, [place], trailer. Yes   Do you have a living will? No   Do you have a DNR form.              reports that he quit smoking about 22 months ago. His smoking use included Cigarettes. He smoked 0.25 packs per day. He has never used smokeless tobacco. He reports that he drinks alcohol. He reports that he does not use drugs.  Family History  Problem Relation Age of Onset  . Cancer Mother    Family Status  Relation Status  . Mother Deceased  . Father Alive     No Known Allergies  Medications: Patient's Medications  New Prescriptions   No medications on file  Previous Medications   CARVEDILOL (COREG) 3.125 MG TABLET    Take 1 tablet (3.125 mg total) by mouth 2 (two) times daily.   SACUBITRIL-VALSARTAN (ENTRESTO) 24-26 MG    Take 1 tablet by mouth 2 (two) times daily.   TRAZODONE (DESYREL) 100 MG TABLET    Take 1 tablet  (100 mg total) by mouth at bedtime.  Modified Medications   No medications on file  Discontinued Medications   No medications on file    Review of Systems  Unable to perform ROS: Other  mentally challenged  Vitals:   05/25/16 1246  BP: 110/70  Pulse: 70  Temp: 97.8 F (36.6 C)  TempSrc: Oral  SpO2: 98%  Weight: 170 lb (77.1 kg)   Body mass index is 22.43 kg/m.  Physical Exam  Constitutional: He appears well-developed and well-nourished.  HENT:  Mouth/Throat: Oropharynx is clear and moist.  Eyes: Pupils are equal, round, and reactive to light. No scleral icterus.  Neck: Neck supple. Carotid bruit is not present. No thyromegaly present.  Cardiovascular: Normal rate, regular rhythm and intact distal pulses.  Exam reveals no gallop and no friction rub.   Murmur (1/6) heard. no distal LE swelling. No calf TTP  Pulmonary/Chest: Effort normal and breath sounds normal. He has no wheezes. He has no rales. He exhibits no tenderness.  Abdominal: Soft. Bowel sounds are normal. He exhibits no distension, no abdominal bruit, no pulsatile midline mass and no mass. There is no tenderness. There is no rebound and no guarding.  Musculoskeletal: He exhibits no edema or tenderness.  Lymphadenopathy:    He has no cervical adenopathy.  Neurological: He is alert.  Skin: Skin is warm and dry. No rash noted.  Psychiatric: He has a normal mood and affect. His behavior is normal.     Labs reviewed: Lab on 03/16/2016  Component Date Value Ref Range Status  . Sodium 03/16/2016 139  135 -  146 mmol/L Final  . Potassium 03/16/2016 4.1  3.5 - 5.3 mmol/L Final  . Chloride 03/16/2016 106  98 - 110 mmol/L Final  . CO2 03/16/2016 29  20 - 31 mmol/L Final  . Glucose, Bld 03/16/2016 116* 65 - 99 mg/dL Final  . BUN 03/16/2016 24  7 - 25 mg/dL Final  . Creat 03/16/2016 1.00  0.70 - 1.25 mg/dL Final   Comment:   For patients > or = 64 years of age: The upper reference limit for Creatinine is  approximately 13% higher for people identified as African-American.     . Calcium 03/16/2016 8.9  8.6 - 10.3 mg/dL Final  Admission on 03/03/2016, Discharged on 03/05/2016  Component Date Value Ref Range Status  . Sodium 03/03/2016 140  135 - 145 mmol/L Final  . Potassium 03/03/2016 4.2  3.5 - 5.1 mmol/L Final  . Chloride 03/03/2016 108  101 - 111 mmol/L Final  . CO2 03/03/2016 23  22 - 32 mmol/L Final  . Glucose, Bld 03/03/2016 85  65 - 99 mg/dL Final  . BUN 03/03/2016 21* 6 - 20 mg/dL Final  . Creatinine, Ser 03/03/2016 1.05  0.61 - 1.24 mg/dL Final  . Calcium 03/03/2016 9.1  8.9 - 10.3 mg/dL Final  . Total Protein 03/03/2016 8.0  6.5 - 8.1 g/dL Final  . Albumin 03/03/2016 4.6  3.5 - 5.0 g/dL Final  . AST 03/03/2016 43* 15 - 41 U/L Final  . ALT 03/03/2016 25  17 - 63 U/L Final  . Alkaline Phosphatase 03/03/2016 41  38 - 126 U/L Final  . Total Bilirubin 03/03/2016 0.5  0.3 - 1.2 mg/dL Final  . GFR calc non Af Amer 03/03/2016 >60  >60 mL/min Final  . GFR calc Af Amer 03/03/2016 >60  >60 mL/min Final   Comment: (NOTE) The eGFR has been calculated using the CKD EPI equation. This calculation has not been validated in all clinical situations. eGFR's persistently <60 mL/min signify possible Chronic Kidney Disease.   . Anion gap 03/03/2016 9  5 - 15 Final  . Alcohol, Ethyl (B) 03/03/2016 149* <5 mg/dL Final   Comment:        LOWEST DETECTABLE LIMIT FOR SERUM ALCOHOL IS 5 mg/dL FOR MEDICAL PURPOSES ONLY   . Salicylate Lvl 17/40/8144 <4.0  2.8 - 30.0 mg/dL Final  . Acetaminophen (Tylenol), Serum 03/03/2016 <10* 10 - 30 ug/mL Final   Comment:        THERAPEUTIC CONCENTRATIONS VARY SIGNIFICANTLY. A RANGE OF 10-30 ug/mL MAY BE AN EFFECTIVE CONCENTRATION FOR MANY PATIENTS. HOWEVER, SOME ARE BEST TREATED AT CONCENTRATIONS OUTSIDE THIS RANGE. ACETAMINOPHEN CONCENTRATIONS >150 ug/mL AT 4 HOURS AFTER INGESTION AND >50 ug/mL AT 12 HOURS AFTER INGESTION ARE OFTEN ASSOCIATED WITH  TOXIC REACTIONS.   . WBC 03/03/2016 5.9  4.0 - 10.5 K/uL Final  . RBC 03/03/2016 4.22  4.22 - 5.81 MIL/uL Final  . Hemoglobin 03/03/2016 13.8  13.0 - 17.0 g/dL Final  . HCT 03/03/2016 39.4  39.0 - 52.0 % Final  . MCV 03/03/2016 93.4  78.0 - 100.0 fL Final  . MCH 03/03/2016 32.7  26.0 - 34.0 pg Final  . MCHC 03/03/2016 35.0  30.0 - 36.0 g/dL Final  . RDW 03/03/2016 12.2  11.5 - 15.5 % Final  . Platelets 03/03/2016 223  150 - 400 K/uL Final  . Opiates 03/04/2016 NONE DETECTED  NONE DETECTED Final  . Cocaine 03/04/2016 POSITIVE* NONE DETECTED Final  . Benzodiazepines 03/04/2016 NONE DETECTED  NONE DETECTED  Final  . Amphetamines 03/04/2016 NONE DETECTED  NONE DETECTED Final  . Tetrahydrocannabinol 03/04/2016 NONE DETECTED  NONE DETECTED Final  . Barbiturates 03/04/2016 NONE DETECTED  NONE DETECTED Final   Comment:        DRUG SCREEN FOR MEDICAL PURPOSES ONLY.  IF CONFIRMATION IS NEEDED FOR ANY PURPOSE, NOTIFY LAB WITHIN 5 DAYS.        LOWEST DETECTABLE LIMITS FOR URINE DRUG SCREEN Drug Class       Cutoff (ng/mL) Amphetamine      1000 Barbiturate      200 Benzodiazepine   331 Tricyclics       343 Opiates          300 Cocaine          300 THC              50     No results found.   Assessment/Plan   ICD-9-CM ICD-10-CM   1. Chronic systolic CHF (congestive heart failure) (HCC) 428.22 I50.22 CMP with eGFR   428.0  Lipid Panel  2. Congestive dilated cardiomyopathy (HCC) 425.4 I42.0 CMP with eGFR  3. Alcohol use disorder, severe, dependence ([place]) 303.90 F10.20 Lipid Panel  4. Severe major depression, single episode, without psychotic features ([place]) 296.23 F32.2 CMP with eGFR  5. Colon cancer screening V76.51 Z12.11 Ambulatory referral to Gastroenterology  6. Seasonal allergies 477.9 J30.2   7. Encounter for immunization Z23 Z23 Flu Vaccine QUAD 36+ mos IM    Will need cardiology f/u appt next OV to mx HF and entresto. Prefers different office provider  Cont current meds as  ordered  May take OTC plain claritin, allegra or zyrtec daily for seasonal allergy  Complete Entresto assistance form  Follow up in 3 mos for routine visit  Alcohol use cessation discussed and highly urge. Recommend AA program. Discussed complications of continued alcohol abuse  Flu shot given today  Will call with lab results  Ardyth Kelso S. Perlie Gold  [facility] and [facility] [address] [place], [place] [address] [phone] Cell (Monday-Friday 8 AM - 5 PM) [phone] After 5 PM and follow prompts

## 2016-05-25 NOTE — Patient Instructions (Addendum)
Continue current medications as ordered  May take OTC plain claritin, allegra or zyrtec daily for seasonal allergy  Complete Entresto assistance form  Follow up in 3 mos for routine visit  Alcohol use cessation discussed and highly urge  Flu shot given today  Will call with lab results  Finding Treatment for Addiction WHAT IS ADDICTION? Addiction is a complex disease of the brain. It causes an uncontrollable (compulsive) need for a substance. You can be addicted to alcohol, illegal drugs, or prescription medicines such as painkillers. Addiction can also be a behavior, like gambling or shopping. The need for the drug or activity can become so strong that you think about it all the time. You can also become physically dependent on a substance. Addiction can change the way your brain works. Because of these changes, getting more of whatever you are addicted to becomes the most important thing to you and feels better than other activities or relationships. Addiction can lead to changes in health, behavior, emotions, relationships, and choices that affect you and everyone around you. HOW DO I KNOW IF I NEED TREATMENT FOR ADDICTION? Addiction is a progressive disease. Without treatment, addiction can get worse. Living with addiction puts you at higher risk for injury, poor health, lost employment, loss of money, and even death. You might need treatment for addiction if:  You have tried to stop or cut down, but you cannot.  Your addiction is causing physical health problems.  You find it annoying that your friends and family are concerned about your alcohol or substance use.  You feel guilty about substance abuse or a compulsive behavior.  You have lied or tried to hide your addiction.  You need a particular substance or activity to start your day or to calm down.  You are getting in trouble at school, work, home, or with the police.  You have done something illegal to support your  addiction.  You are running out of money because of your addiction.  You have no time for anything other than your addiction. WHAT TYPES OF TREATMENT ARE AVAILABLE? The treatment program that is right for you will depend on many factors, including the type of addiction you have. Treatment programs can be outpatient or inpatient. In an outpatient program, you live at home and go to work or school, but you also go to a clinic for treatment. With an inpatient program, you live and sleep at the program facility during treatment. After treatment, you might need a plan for support during recovery. Other treatment options include:   Medicine.  Some addictions may be treated with prescription medicines.  You might also need medicine to treat anxiety or depression.  Counseling and behavior therapy. Therapy can help individuals and families behave in healthier ways and relate more effectively.  Support groups. Confidential group therapy, such as a 12-step program, can help individuals and families during treatment and recovery. No single type of program is right for everyone. Many treatment programs involve a combination of education, counseling, and a 12-step, spiritually-based approach. Some treatment programs are government sponsored. They are geared for patients who do not have private insurance. Treatment programs can vary in many respects, such as:  Cost and types of insurance that are accepted.  Types of on-site medical services that are offered.  Length of stay, setting, and size.  Overall philosophy of treatment. WHAT SHOULD I CONSIDER WHEN SELECTING A TREATMENT PROGRAM? It is important to think about your individual requirements when selecting a treatment  program. There are a number of things to consider, such as:  If the program is certified by the appropriate government agency. Even private programs must be certified and employ certified professionals.  If the program is covered by  your insurance. If finances are a concern, the first call you should make is to your insurance company, if you have health insurance. Ask for a list of treatment programs that are in your network, and confirm any copayments and deductibles that you may have to pay.  If you do not have insurance, or if you choose to attend a program that does not accept your insurance, discuss whether a payment plan can be set up.  If treatment is available in languages other than English, if needed.  If the program offers detoxification treatment, if needed.  If 12-step meetings are held at the center or if transport is available for patients to attend meetings at other locations.  If the program is professional, organized, and clean.  If the program meets all of your needs, including physical and cultural needs.  If the facility offers specific treatment for your particular addiction.  If support continues to be offered after you have left the program.  If your treatment plan is continually looked at to make sure you are receiving the right treatment at the right time.  If mental health counseling is part of your treatment.  If medicine is included in treatment, if needed.  If your family is included in your treatment plan and if support is offered to them throughout the treatment process.  How the treatment works to prevent relapse. WHERE ELSE CAN I GET HELP?  Your health care provider. Ask him or her to help you find addiction treatment. These discussions are confidential.  The [employer] on Alcoholism and Drug Dependence (NCADD). This group has information about treatment centers and programs for people who have an addiction and for family members.  The telephone number is 1-800-NCA-CALL (618-067-31811-(847) 472-8458).  The website is https://ncadd.org/about-ncadd/our-affiliates  The Substance Abuse and Mental Health Services Administration [facility](SAMHSA). This group will help you find publicly funded  treatment centers, help hotlines, and counseling services near you.  The telephone number is 1-800-662-HELP (619 543 41391-(613)649-6807).  The website is www.findtreatment.Rock[employer].plsamhsa.gov In countries outside of the [place], look in [employer] directories for contact information for services in your area.   This information is not intended to replace advice given to you by your health care provider. Make sure you discuss any questions you have with your health care provider.   Document Released: 08/16/2005 Document Revised: 06/08/2015 Document Reviewed: 07/06/2014 Elsevier Interactive Patient Education [employer]2016 Elsevier Inc.

## 2016-05-26 LAB — COMPLETE METABOLIC PANEL WITH GFR
ALBUMIN: 4.1 g/dL (ref 3.6–5.1)
ALK PHOS: 36 U/L — AB (ref 40–115)
ALT: 17 U/L (ref 9–46)
AST: 23 U/L (ref 10–35)
BILIRUBIN TOTAL: 0.5 mg/dL (ref 0.2–1.2)
BUN: 13 mg/dL (ref 7–25)
CALCIUM: 9.2 mg/dL (ref 8.6–10.3)
CO2: 26 mmol/L (ref 20–31)
Chloride: 103 mmol/L (ref 98–110)
Creat: 0.99 mg/dL (ref 0.70–1.25)
GFR, EST NON AFRICAN AMERICAN: 80 mL/min (ref >=60)
GFR, Est African American: >89 mL/min (ref >=60)
GLUCOSE: 81 mg/dL (ref 65–99)
Potassium: 4.4 mmol/L (ref 3.5–5.3)
SODIUM: 138 mmol/L (ref 135–146)
TOTAL PROTEIN: 6.6 g/dL (ref 6.1–8.1)

## 2016-05-26 LAB — LIPID PANEL
CHOLESTEROL: 193 mg/dL (ref 125–200)
HDL: 112 mg/dL (ref >=40)
LDL Cholesterol: 71 mg/dL (ref <130)
Total CHOL/HDL Ratio: 1.7 Ratio (ref <=5.0)
Triglycerides: 51 mg/dL (ref <150)
VLDL: 10 mg/dL (ref <30)

## 2016-05-30 ENCOUNTER — Encounter: Payer: Self-pay | Admitting: Gastroenterology

## 2016-07-02 ENCOUNTER — Other Ambulatory Visit: Payer: Self-pay | Admitting: Internal Medicine

## 2016-08-07 ENCOUNTER — Ambulatory Visit (INDEPENDENT_AMBULATORY_CARE_PROVIDER_SITE_OTHER): Payer: Medicare HMO | Admitting: Gastroenterology

## 2016-08-07 ENCOUNTER — Encounter (INDEPENDENT_AMBULATORY_CARE_PROVIDER_SITE_OTHER): Payer: Self-pay

## 2016-08-07 ENCOUNTER — Encounter: Payer: Self-pay | Admitting: Gastroenterology

## 2016-08-07 VITALS — BP 100/54 | HR 68 | Ht 71.25 in | Wt 168.2 lb

## 2016-08-07 DIAGNOSIS — F1420 Cocaine dependence, uncomplicated: Secondary | ICD-10-CM

## 2016-08-07 DIAGNOSIS — R69 Illness, unspecified: Secondary | ICD-10-CM | POA: Diagnosis not present

## 2016-08-07 DIAGNOSIS — Z1211 Encounter for screening for malignant neoplasm of colon: Secondary | ICD-10-CM | POA: Diagnosis not present

## 2016-08-07 DIAGNOSIS — F1010 Alcohol abuse, uncomplicated: Secondary | ICD-10-CM | POA: Diagnosis not present

## 2016-08-07 DIAGNOSIS — I5022 Chronic systolic (congestive) heart failure: Secondary | ICD-10-CM | POA: Diagnosis not present
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
## 2016-08-07 NOTE — Patient Instructions (Addendum)
If you are age 64 or older, your body mass index should be between 23-30. Your Body mass index is 23.3 kg/m. If this is out of the aforementioned range listed, please consider follow up with your Primary Care Provider.  If you are age 47 or younger, your body mass index should be between 19-25. Your Body mass index is 23.3 kg/m. If this is out of the aformentioned range listed, please consider follow up with your Primary Care Provider.   We have sent your demographic and insurance information to [employer]. They should contact you within the next week regarding your Cologuard (colon cancer screening) test. If you have not heard from them within the next week, please call our office at [phone].  Thank you for choosing [facility]  Dr Amada Jupiter III

## 2016-08-08 ENCOUNTER — Telehealth: Payer: Self-pay | Admitting: *Deleted

## 2016-08-08 NOTE — Telephone Encounter (Signed)
Mary notified and agreed.  

## 2016-08-08 NOTE — Telephone Encounter (Signed)
He should not need any med unless dentist tells him otherwise

## 2016-08-08 NOTE — Telephone Encounter (Signed)
Patient caregiver, Corrie Dandy called and stated that she is taking patient to have a tooth pulled on Friday and was wondering if patient is suppose to take anything prior to the appointment due to heart problems. Please Advise.

## 2016-08-27 DIAGNOSIS — Z1212 Encounter for screening for malignant neoplasm of rectum: Secondary | ICD-10-CM | POA: Diagnosis not present

## 2016-08-27 DIAGNOSIS — Z1211 Encounter for screening for malignant neoplasm of colon: Secondary | ICD-10-CM | POA: Diagnosis not present

## 2016-08-29 ENCOUNTER — Encounter: Payer: Self-pay | Admitting: Internal Medicine

## 2016-08-29 ENCOUNTER — Ambulatory Visit (INDEPENDENT_AMBULATORY_CARE_PROVIDER_SITE_OTHER): Payer: Medicare HMO | Admitting: Internal Medicine

## 2016-08-29 VITALS — BP 118/68 | HR 63 | Temp 97.9°F | Ht 71.0 in | Wt 168.8 lb

## 2016-08-29 DIAGNOSIS — I10 Essential (primary) hypertension: Secondary | ICD-10-CM | POA: Diagnosis not present

## 2016-08-29 DIAGNOSIS — F322 Major depressive disorder, single episode, severe without psychotic features: Secondary | ICD-10-CM | POA: Diagnosis not present

## 2016-08-29 DIAGNOSIS — F1020 Alcohol dependence, uncomplicated: Secondary | ICD-10-CM

## 2016-08-29 DIAGNOSIS — I420 Dilated cardiomyopathy: Secondary | ICD-10-CM | POA: Diagnosis not present

## 2016-08-29 DIAGNOSIS — I5022 Chronic systolic (congestive) heart failure: Secondary | ICD-10-CM | POA: Diagnosis not present

## 2016-08-29 DIAGNOSIS — R69 Illness, unspecified: Secondary | ICD-10-CM | POA: Diagnosis not present

## 2016-08-29 DIAGNOSIS — N522 Drug-induced erectile dysfunction: Secondary | ICD-10-CM | POA: Diagnosis not present

## 2016-08-29 NOTE — Patient Instructions (Signed)
Will call with referral appt to urologist  Continue current medications as ordered  Follow up with cardiology as scheduled  Follow up in 3 mos for routine visit
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
## 2016-08-31 ENCOUNTER — Other Ambulatory Visit: Payer: Self-pay | Admitting: *Deleted

## 2016-08-31 MED ORDER — TRAZODONE HCL 100 MG PO TABS: 100.0000 mg | ORAL_TABLET | Freq: Every day | ORAL | 3 refills | Status: DC

## 2016-08-31 MED ORDER — CARVEDILOL 3.125 MG PO TABS: 3.1250 mg | ORAL_TABLET | Freq: Two times a day (BID) | ORAL | 5 refills | Status: DC

## 2016-08-31 NOTE — Telephone Encounter (Signed)
Corrie Dandy, caregiver requested Rx to be sent to [facility].

## 2016-09-10 ENCOUNTER — Other Ambulatory Visit: Payer: Self-pay

## 2016-09-10 LAB — COLOGUARD: Cologuard: NEGATIVE

## 2016-10-12 IMAGING — CT CT CERVICAL SPINE W/O CM
3 of 6 series · 11 of 33 positions shown, 13 images · non-contrast
Comparison: CT of the facial bone dated 02/06/2013

CLINICAL DATA: 65-year-old male with fall

EXAM:
CT HEAD WITHOUT CONTRAST
CT CERVICAL SPINE WITHOUT CONTRAST
TECHNIQUE: Multidetector CT imaging of the head and cervical spine was
performed following the standard protocol without intravenous
contrast. Multiplanar CT image reconstructions of the cervical spine
were also generated.

[Series 5: c-spine st · axial · 0.29mm/px · z∈[-249,-141]mm · 3 of 109 slices shown, 4 images]
[im 28/109  soft-tissue]
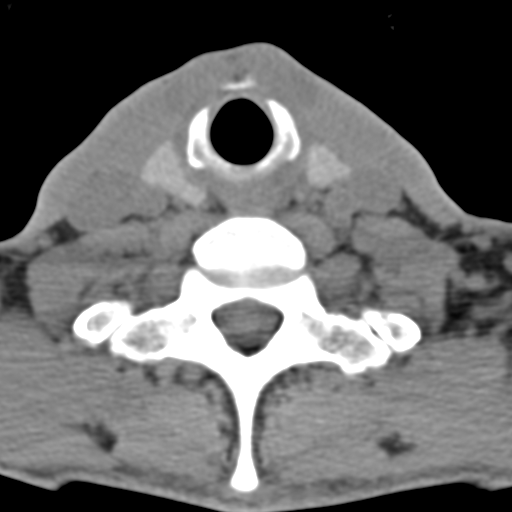
[im 28/109  bone]
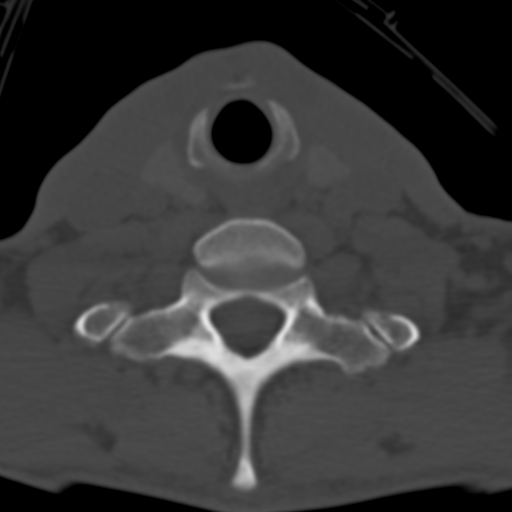
[im 55/109  bone]
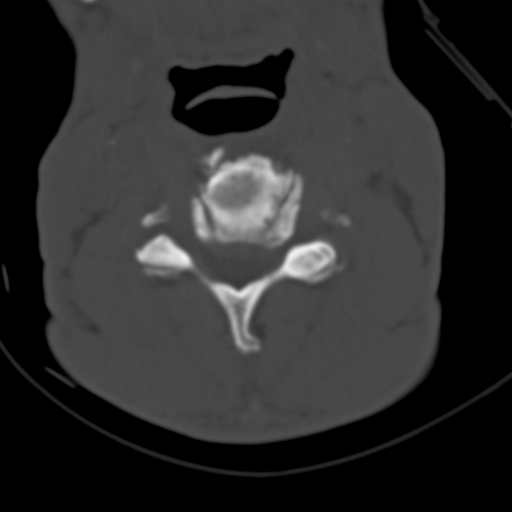
[im 82/109  bone]
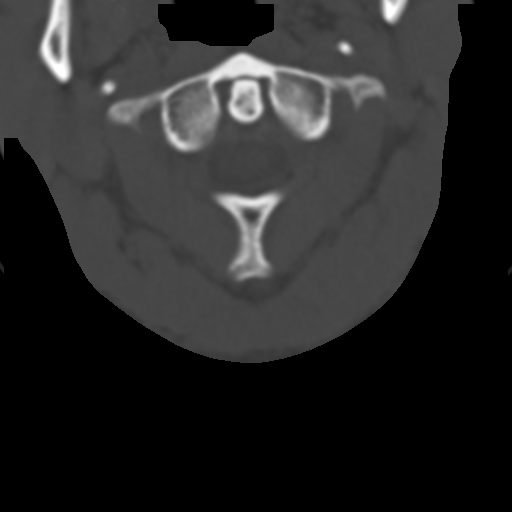

[Series 9: coronal recons · coronal · 0.25mm/px · 3 of 61 slices shown]
[im 13/61  bone]
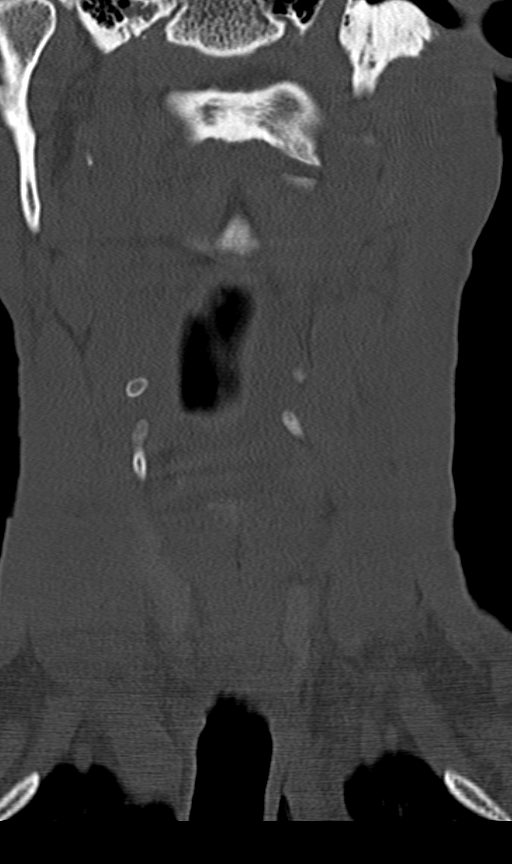
[im 25/61  bone]
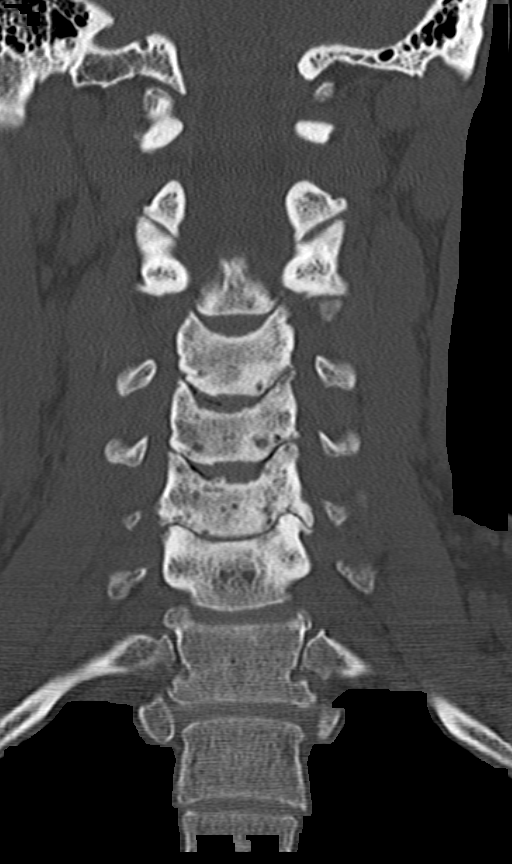
[im 37/61  bone]
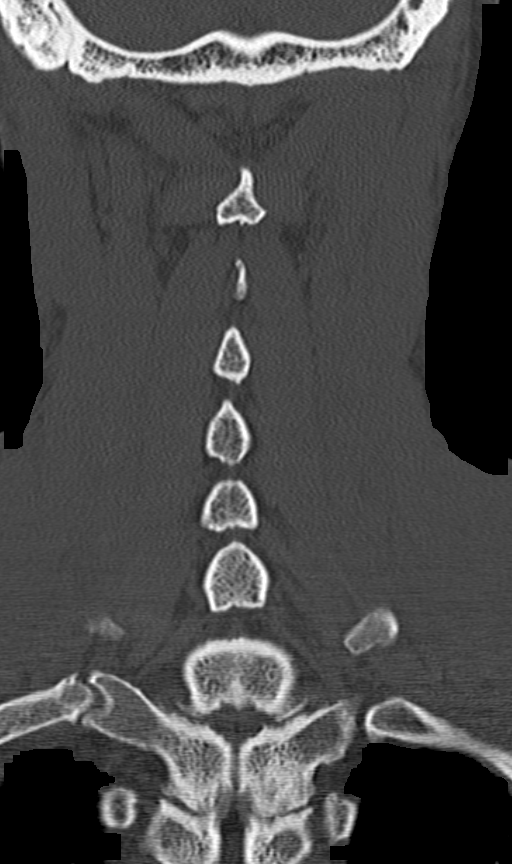

[Series 10: sagittal recons · sagittal · 0.26mm/px · 5 of 54 slices shown, 6 images]
[im 18/54  bone]
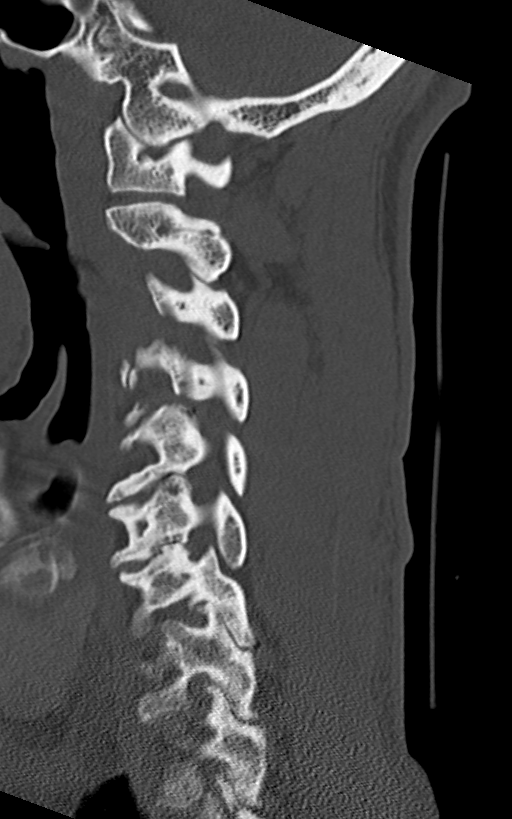
[im 23/54  bone]
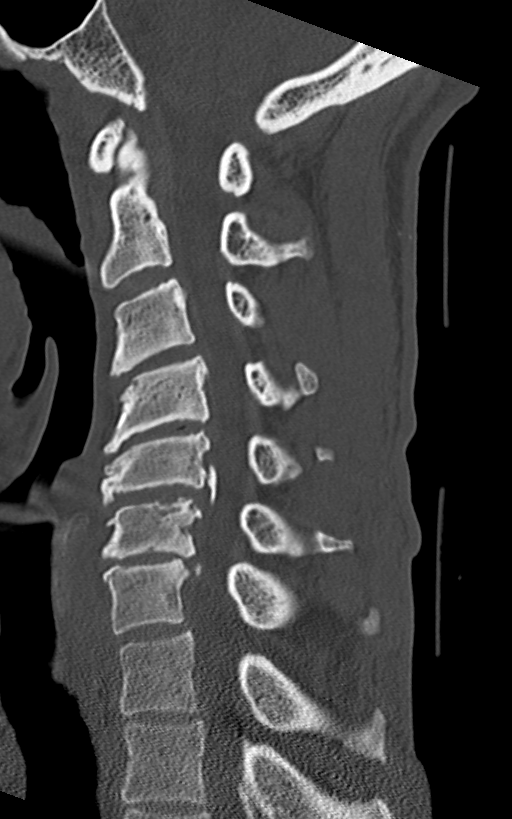
[im 27/54  soft-tissue]
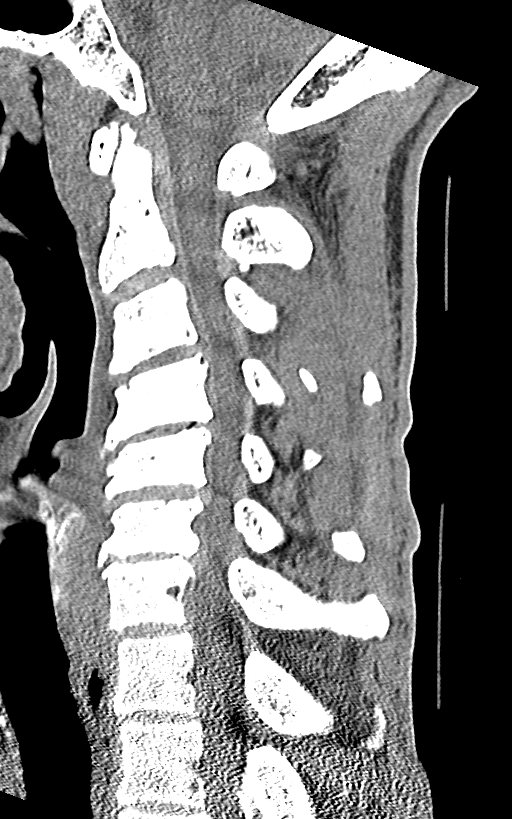
[im 27/54  bone]
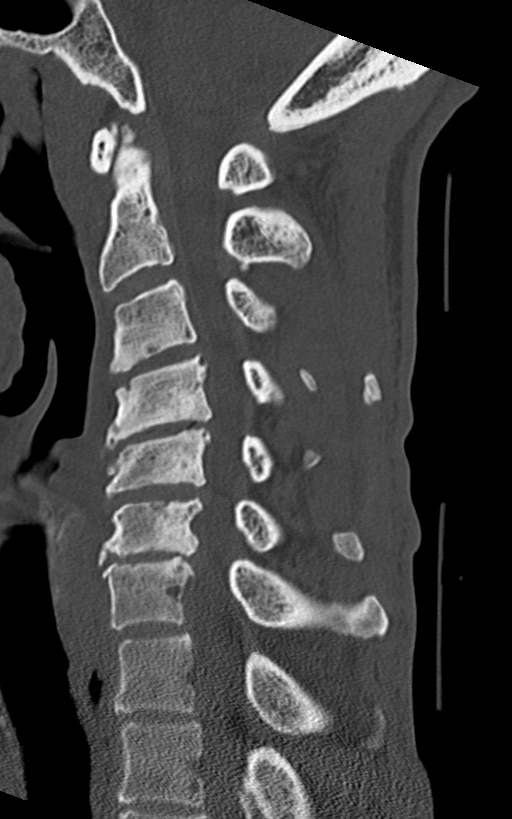
[im 31/54  bone]
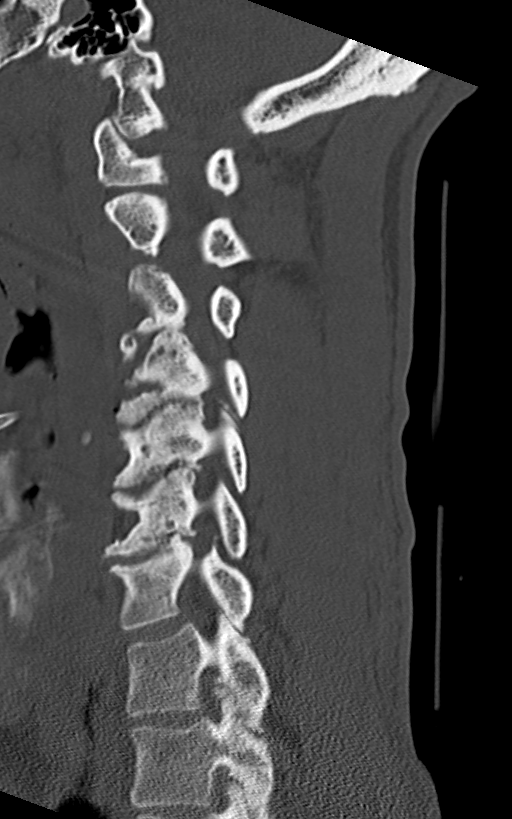
[im 36/54  bone]
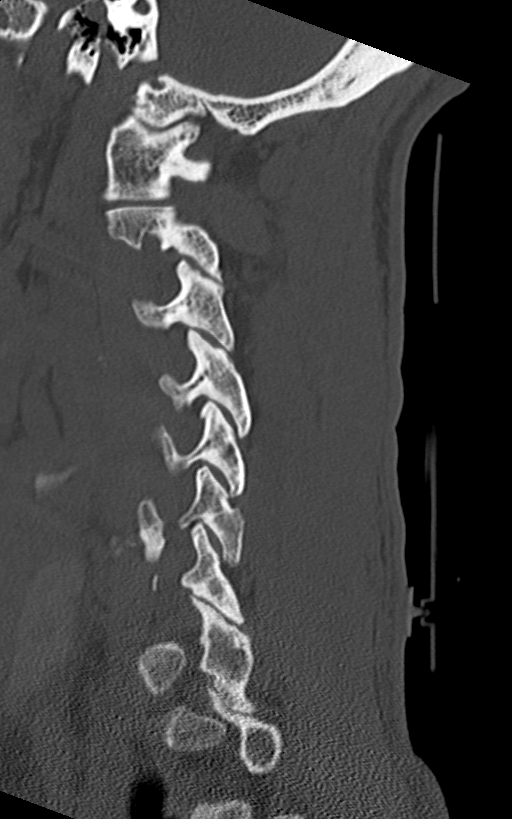

[11 of 33 positions shown; findings below may reference images not displayed]

FINDINGS: CT HEAD FINDINGS

The ventricles and sulci are appropriate in size for patient's age.
Mild periventricular and deep white matter chronic microvascular
ischemic changes noted. There is no acute intracranial hemorrhage.
No mass effect or midline shift noted.

There is diffuse mucoperiosteal thickening of paranasal sinuses with
partial opacification of the ethmoid air cells. No air-fluid levels.
The mastoid air cells are clear. The calvarium is intact.

CT CERVICAL SPINE FINDINGS

There is no acute fracture or subluxation of the cervical
spine.There is mild reversal of normal cervical lordosis which may
be positional or due to muscle spasm. Multilevel degenerative
changes of the cervical spine most prominent at C4-C6.The odontoid
and spinous processes are intact.There is normal anatomic alignment
of the C1-C2 lateral masses. The visualized soft tissues appear
unremarkable.
IMPRESSION: No acute intracranial hemorrhage.

No acute/ traumatic cervical spine pathology.

## 2016-10-22 ENCOUNTER — Ambulatory Visit (INDEPENDENT_AMBULATORY_CARE_PROVIDER_SITE_OTHER): Payer: Medicare HMO | Admitting: Internal Medicine

## 2016-10-22 ENCOUNTER — Encounter: Payer: Self-pay | Admitting: Internal Medicine

## 2016-10-22 VITALS — BP 118/60 | HR 72 | Ht 71.0 in | Wt 170.8 lb

## 2016-10-22 DIAGNOSIS — I5022 Chronic systolic (congestive) heart failure: Secondary | ICD-10-CM | POA: Diagnosis not present

## 2016-10-22 MED ORDER — LISINOPRIL 10 MG PO TABS: 10.0000 mg | ORAL_TABLET | Freq: Every day | ORAL | 3 refills | Status: DC

## 2016-10-22 NOTE — Patient Instructions (Signed)
Your physician has recommended you make the following change in your medication:   1) STOP ENTRESTO 2.) START LISINOPRIL 10 MG ONCE A DAY Your physician recommends that you return for lab work TODAY (bmet)  Your physician wants you to follow-up in: June, 2018 with Dr. Tenny Craw.  You will receive a reminder letter in the mail two months in advance. If you don't receive a letter, please call our office to schedule the follow-up appointment.
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
## 2016-10-23 LAB — BASIC METABOLIC PANEL
BUN/Creatinine Ratio: 14 (ref 10–24)
BUN: 13 mg/dL (ref 8–27)
CALCIUM: 9.4 mg/dL (ref 8.6–10.2)
CO2: 26 mmol/L (ref 18–29)
CREATININE: 0.95 mg/dL (ref 0.76–1.27)
Chloride: 103 mmol/L (ref 96–106)
GFR calc Af Amer: 97 mL/min/{1.73_m2} (ref >59)
GFR, EST NON AFRICAN AMERICAN: 84 mL/min/{1.73_m2} (ref >59)
Glucose: 88 mg/dL (ref 65–99)
POTASSIUM: 3.8 mmol/L (ref 3.5–5.2)
Sodium: 144 mmol/L (ref 134–144)

## 2016-11-01 ENCOUNTER — Telehealth: Payer: Self-pay

## 2016-11-01 NOTE — Telephone Encounter (Signed)
Fax received from [employer] for Knee Orthosis phone [phone], fax [phone].  I called and spoke with patient's aunt to confirm if he requested supplies. Patient' aunt requested that I discard paperwork at this time, to her knowledge patient did not need any equipment.   Form was faxed back indicating patient not interested and please remove patient off soliciting list

## 2016-11-28 ENCOUNTER — Ambulatory Visit: Payer: Medicare HMO | Admitting: Internal Medicine

## 2016-12-14 DIAGNOSIS — N401 Enlarged prostate with lower urinary tract symptoms: Secondary | ICD-10-CM | POA: Diagnosis not present

## 2016-12-14 DIAGNOSIS — R350 Frequency of micturition: Secondary | ICD-10-CM | POA: Diagnosis not present

## 2016-12-14 DIAGNOSIS — N5201 Erectile dysfunction due to arterial insufficiency: Secondary | ICD-10-CM | POA: Diagnosis not present

## 2017-01-08 NOTE — Telephone Encounter (Signed)
A fax was received from [employer] for a back brace.  I spoke with patient's aunt, Mrs. [name], and she stated that patient has not requested any equipment from this company.   Forms were faxed back asking that patient be removed from their mailing lists.

## 2017-02-27 ENCOUNTER — Encounter: Payer: Self-pay | Admitting: Internal Medicine

## 2017-03-05 ENCOUNTER — Encounter: Payer: Self-pay | Admitting: Internal Medicine

## 2017-03-22 ENCOUNTER — Other Ambulatory Visit: Payer: Self-pay | Admitting: Internal Medicine

## 2017-03-22 ENCOUNTER — Telehealth: Payer: Self-pay | Admitting: Internal Medicine

## 2017-03-22 NOTE — Telephone Encounter (Signed)
Called pt and spoke with [name], to inform her that that pt has refills of lisinopril at his pharmacy and if he has any other problems, questions or concerns to call our office. [name] verbalized understanding.

## 2017-03-22 NOTE — Telephone Encounter (Signed)
New message     *STAT* If patient is at the pharmacy, call can be transferred to refill team.   1. Which medications need to be refilled? (please list name of each medication and dose if known) lisinopril 10 mg  2. Which pharmacy/location (including street and city if local pharmacy) is medication to be sent to? [facility] on [address] [phone]  3. Do they need a 30 day or 90 day supply? 30 day

## 2017-04-26 ENCOUNTER — Ambulatory Visit (INDEPENDENT_AMBULATORY_CARE_PROVIDER_SITE_OTHER): Payer: Medicare HMO | Admitting: Physician Assistant

## 2017-04-26 ENCOUNTER — Encounter: Payer: Self-pay | Admitting: Physician Assistant

## 2017-04-26 VITALS — BP 138/62 | HR 64 | Ht 71.0 in | Wt 164.8 lb

## 2017-04-26 DIAGNOSIS — I5022 Chronic systolic (congestive) heart failure: Secondary | ICD-10-CM | POA: Diagnosis not present

## 2017-04-26 DIAGNOSIS — Z720 Tobacco use: Secondary | ICD-10-CM

## 2017-04-26 DIAGNOSIS — I10 Essential (primary) hypertension: Secondary | ICD-10-CM

## 2017-04-26 MED ORDER — LISINOPRIL 20 MG PO TABS: 20.0000 mg | ORAL_TABLET | Freq: Every evening | ORAL | 11 refills | Status: DC

## 2017-04-26 NOTE — Patient Instructions (Addendum)
Medication Instructions:  Increase Lisinopril to 20 mg Once daily  You can take 2 of the Lisinopril 10 mg tablets to get 20 mg until you run out of it. A prescription was sent in for Lisinopril 20 mg to your pharmacy. Try to take the Lisinopril in the evening.   Labwork: In 2 weeks - BMET  Testing/Procedures: None   Follow-Up: Dr. Dietrich Pates in 6 mos.   Any Other Special Instructions Will Be Listed Below (If Applicable).  If you need a refill on your cardiac medications before your next appointment, please call your pharmacy.
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
## 2017-05-07 DIAGNOSIS — M7581 Other shoulder lesions, right shoulder: Secondary | ICD-10-CM | POA: Diagnosis not present

## 2017-05-08 ENCOUNTER — Ambulatory Visit: Payer: Medicare HMO

## 2017-05-10 ENCOUNTER — Other Ambulatory Visit: Payer: Medicare HMO | Admitting: *Deleted

## 2017-05-10 ENCOUNTER — Encounter: Payer: Medicare HMO | Admitting: Internal Medicine

## 2017-05-10 ENCOUNTER — Other Ambulatory Visit: Payer: Self-pay

## 2017-05-10 ENCOUNTER — Ambulatory Visit: Payer: Medicare HMO

## 2017-05-10 DIAGNOSIS — I5022 Chronic systolic (congestive) heart failure: Secondary | ICD-10-CM | POA: Diagnosis not present

## 2017-05-10 LAB — BASIC METABOLIC PANEL
BUN/Creatinine Ratio: 16 (ref 10–24)
BUN: 15 mg/dL (ref 8–27)
CALCIUM: 9.3 mg/dL (ref 8.6–10.2)
CO2: 23 mmol/L (ref 20–29)
CREATININE: 0.91 mg/dL (ref 0.76–1.27)
Chloride: 106 mmol/L (ref 96–106)
GFR calc Af Amer: 102 mL/min/{1.73_m2} (ref >59)
GFR, EST NON AFRICAN AMERICAN: 88 mL/min/{1.73_m2} (ref >59)
Glucose: 88 mg/dL (ref 65–99)
Potassium: 4.4 mmol/L (ref 3.5–5.2)
SODIUM: 143 mmol/L (ref 134–144)

## 2017-05-13 ENCOUNTER — Telehealth: Payer: Self-pay | Admitting: *Deleted

## 2017-05-13 NOTE — Telephone Encounter (Signed)
-----   Message from Beatrice Lecher, [place] sent at 05/12/2017  4:38 PM EDT ----- Please call patient: The kidney function (BUN, Creatinine) and potassium are normal. All other parameters are within acceptable limits and no further intervention or testing required. Continue with current treatment plan. Tereso Newcomer, PA-C    05/12/2017 4:38 PM

## 2017-05-13 NOTE — Telephone Encounter (Signed)
DPR ok to sp/w pt's Aunt [name] who has been notified of pt's lab results by phone with verbal understanding.

## 2017-05-21 ENCOUNTER — Encounter: Payer: Medicare HMO | Admitting: Internal Medicine

## 2017-05-24 ENCOUNTER — Ambulatory Visit (INDEPENDENT_AMBULATORY_CARE_PROVIDER_SITE_OTHER): Payer: Medicare HMO

## 2017-05-24 VITALS — BP 130/65 | HR 51 | Temp 97.5°F | Ht 71.0 in | Wt 163.0 lb

## 2017-05-24 DIAGNOSIS — Z1211 Encounter for screening for malignant neoplasm of colon: Secondary | ICD-10-CM | POA: Diagnosis not present

## 2017-05-24 DIAGNOSIS — Z23 Encounter for immunization: Secondary | ICD-10-CM

## 2017-05-24 DIAGNOSIS — Z0000 Encounter for general adult medical examination without abnormal findings: Secondary | ICD-10-CM | POA: Diagnosis not present

## 2017-05-24 NOTE — Patient Instructions (Signed)
[name] , Thank you for taking time to come for your Medicare Wellness Visit. I appreciate your ongoing commitment to your health goals. Please review the following plan we discussed and let me know if I can assist you in the future.   Screening recommendations/referrals: Colonoscopy due, referral put in Recommended yearly ophthalmology/optometry visit for glaucoma screening and checkup Recommended yearly dental visit for hygiene and checkup  Vaccinations: Influenza vaccine given. Due 2019 flu season Pneumococcal vaccine 13 given. PNA 23 due 05/24/18 Tdap vaccine up to date. Due 06/11/25 Shingles vaccine due. Please call your pharmacy to see how much it will cost you, if you want this let us know and we will send over a prescription.  Advanced directives: DNR in chart, copies of health care power of attorney and living will are needed   Conditions/risks identified: None  Next appointment: Dr. Montez Morita 06/07/17 @ 9:45am  Preventive Care 65 Years and Older, Male Preventive care refers to lifestyle choices and visits with your health care provider that can promote health and wellness. What does preventive care include?  A yearly physical exam. This is also called an annual well check.  Dental exams once or twice a year.  Routine eye exams. Ask your health care provider how often you should have your eyes checked.  Personal lifestyle choices, including:  Daily care of your teeth and gums.  Regular physical activity.  Eating a healthy diet.  Avoiding tobacco and drug use.  Limiting alcohol use.  Practicing safe sex.  Taking low doses of aspirin every day.  Taking vitamin and mineral supplements as recommended by your health care provider. What happens during an annual well check? The services and screenings done by your health care provider during your annual well check will depend on your age, overall health, lifestyle risk factors, and family history of disease. Counseling    Your health care provider may ask you questions about your:  Alcohol use.  Tobacco use.  Drug use.  Emotional well-being.  Home and relationship well-being.  Sexual activity.  Eating habits.  History of falls.  Memory and ability to understand (cognition).  Work and work Astronomer. Screening  You may have the following tests or measurements:  Height, weight, and BMI.  Blood pressure.  Lipid and cholesterol levels. These may be checked every 5 years, or more frequently if you are over 44 years old.  Skin check.  Lung cancer screening. You may have this screening every year starting at age 8 if you have a 30-pack-year history of smoking and currently smoke or have quit within the past 15 years.  Fecal occult blood test (FOBT) of the stool. You may have this test every year starting at age 51.  Flexible sigmoidoscopy or colonoscopy. You may have a sigmoidoscopy every 5 years or a colonoscopy every 10 years starting at age 14.  Prostate cancer screening. Recommendations will vary depending on your family history and other risks.  Hepatitis C blood test.  Hepatitis B blood test.  Sexually transmitted disease (STD) testing.  Diabetes screening. This is done by checking your blood sugar (glucose) after you have not eaten for a while (fasting). You may have this done every 1-3 years.  Abdominal aortic aneurysm (AAA) screening. You may need this if you are a current or former smoker.  Osteoporosis. You may be screened starting at age 8 if you are at high risk. Talk with your health care provider about your test results, treatment options, and if necessary, the  need for more tests. Vaccines  Your health care provider may recommend certain vaccines, such as:  Influenza vaccine. This is recommended every year.  Tetanus, diphtheria, and acellular pertussis (Tdap, Td) vaccine. You may need a Td booster every 10 years.  Zoster vaccine. You may need this after age  25.  Pneumococcal 13-valent conjugate (PCV13) vaccine. One dose is recommended after age 61.  Pneumococcal polysaccharide (PPSV23) vaccine. One dose is recommended after age 7. Talk to your health care provider about which screenings and vaccines you need and how often you need them. This information is not intended to replace advice given to you by your health care provider. Make sure you discuss any questions you have with your health care provider. Document Released: 10/14/2015 Document Revised: 06/06/2016 Document Reviewed: 07/19/2015 Elsevier Interactive Patient Education  2017 [employer] Prevention in the Home Falls can cause injuries. They can happen to people of all ages. There are many things you can do to make your home safe and to help prevent falls. What can I do on the outside of my home?  Regularly fix the edges of walkways and driveways and fix any cracks.  Remove anything that might make you trip as you walk through a door, such as a raised step or threshold.  Trim any bushes or trees on the path to your home.  Use bright outdoor lighting.  Clear any walking paths of anything that might make someone trip, such as rocks or tools.  Regularly check to see if handrails are loose or broken. Make sure that both sides of any steps have handrails.  Any raised decks and porches should have guardrails on the edges.  Have any leaves, snow, or ice cleared regularly.  Use sand or salt on walking paths during winter.  Clean up any spills in your garage right away. This includes oil or grease spills. What can I do in the bathroom?  Use night lights.  Install grab bars by the toilet and in the tub and shower. Do not use towel bars as grab bars.  Use non-skid mats or decals in the tub or shower.  If you need to sit down in the shower, use a plastic, non-slip stool.  Keep the floor dry. Clean up any water that spills on the floor as soon as it happens.  Remove  soap buildup in the tub or shower regularly.  Attach bath mats securely with double-sided non-slip rug tape.  Do not have throw rugs and other things on the floor that can make you trip. What can I do in the bedroom?  Use night lights.  Make sure that you have a light by your bed that is easy to reach.  Do not use any sheets or blankets that are too big for your bed. They should not hang down onto the floor.  Have a firm chair that has side arms. You can use this for support while you get dressed.  Do not have throw rugs and other things on the floor that can make you trip. What can I do in the kitchen?  Clean up any spills right away.  Avoid walking on wet floors.  Keep items that you use a lot in easy-to-reach places.  If you need to reach something above you, use a strong step stool that has a grab bar.  Keep electrical cords out of the way.  Do not use floor polish or wax that makes floors slippery. If you must use wax,  use non-skid floor wax.  Do not have throw rugs and other things on the floor that can make you trip. What can I do with my stairs?  Do not leave any items on the stairs.  Make sure that there are handrails on both sides of the stairs and use them. Fix handrails that are broken or loose. Make sure that handrails are as long as the stairways.  Check any carpeting to make sure that it is firmly attached to the stairs. Fix any carpet that is loose or worn.  Avoid having throw rugs at the top or bottom of the stairs. If you do have throw rugs, attach them to the floor with carpet tape.  Make sure that you have a light switch at the top of the stairs and the bottom of the stairs. If you do not have them, ask someone to add them for you. What else can I do to help prevent falls?  Wear shoes that:  Do not have high heels.  Have rubber bottoms.  Are comfortable and fit you well.  Are closed at the toe. Do not wear sandals.  If you use a  stepladder:  Make sure that it is fully opened. Do not climb a closed stepladder.  Make sure that both sides of the stepladder are locked into place.  Ask someone to hold it for you, if possible.  Clearly mark and make sure that you can see:  Any grab bars or handrails.  First and last steps.  Where the edge of each step is.  Use tools that help you move around (mobility aids) if they are needed. These include:  Canes.  Walkers.  Scooters.  Crutches.  Turn on the lights when you go into a dark area. Replace any light bulbs as soon as they burn out.  Set up your furniture so you have a clear path. Avoid moving your furniture around.  If any of your floors are uneven, fix them.  If there are any pets around you, be aware of where they are.  Review your medicines with your doctor. Some medicines can make you feel dizzy. This can increase your chance of falling. Ask your doctor what other things that you can do to help prevent falls. This information is not intended to replace advice given to you by your health care provider. Make sure you discuss any questions you have with your health care provider. Document Released: 07/14/2009 Document Revised: 02/23/2016 Document Reviewed: 10/22/2014 Elsevier Interactive Patient Education  2017 [employer].
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
## 2017-06-07 ENCOUNTER — Encounter: Payer: Self-pay | Admitting: Internal Medicine

## 2017-06-07 ENCOUNTER — Ambulatory Visit (INDEPENDENT_AMBULATORY_CARE_PROVIDER_SITE_OTHER): Payer: Medicare HMO | Admitting: Internal Medicine

## 2017-06-07 VITALS — BP 118/70 | HR 60 | Temp 97.6°F | Ht 71.0 in | Wt 168.8 lb

## 2017-06-07 DIAGNOSIS — B353 Tinea pedis: Secondary | ICD-10-CM

## 2017-06-07 DIAGNOSIS — F1020 Alcohol dependence, uncomplicated: Secondary | ICD-10-CM

## 2017-06-07 DIAGNOSIS — Z114 Encounter for screening for human immunodeficiency virus [HIV]: Secondary | ICD-10-CM

## 2017-06-07 DIAGNOSIS — Z9189 Other specified personal risk factors, not elsewhere classified: Secondary | ICD-10-CM

## 2017-06-07 DIAGNOSIS — R69 Illness, unspecified: Secondary | ICD-10-CM | POA: Diagnosis not present

## 2017-06-07 DIAGNOSIS — Z0000 Encounter for general adult medical examination without abnormal findings: Secondary | ICD-10-CM

## 2017-06-07 DIAGNOSIS — R200 Anesthesia of skin: Secondary | ICD-10-CM

## 2017-06-07 DIAGNOSIS — N401 Enlarged prostate with lower urinary tract symptoms: Secondary | ICD-10-CM | POA: Insufficient documentation

## 2017-06-07 DIAGNOSIS — I5022 Chronic systolic (congestive) heart failure: Secondary | ICD-10-CM

## 2017-06-07 DIAGNOSIS — B372 Candidiasis of skin and nail: Secondary | ICD-10-CM

## 2017-06-07 DIAGNOSIS — R202 Paresthesia of skin: Secondary | ICD-10-CM | POA: Diagnosis not present

## 2017-06-07 DIAGNOSIS — Z1159 Encounter for screening for other viral diseases: Secondary | ICD-10-CM | POA: Diagnosis not present

## 2017-06-07 DIAGNOSIS — I10 Essential (primary) hypertension: Secondary | ICD-10-CM

## 2017-06-07 DIAGNOSIS — R3912 Poor urinary stream: Secondary | ICD-10-CM

## 2017-06-07 MED ORDER — ZOSTER VAC RECOMB ADJUVANTED 50 MCG/0.5ML IM SUSR: 0.5000 mL | Freq: Once | INTRAMUSCULAR | 1 refills | Status: AC

## 2017-06-07 MED ORDER — NYSTATIN-TRIAMCINOLONE 100000-0.1 UNIT/GM-% EX CREA: 1.0000 "application " | TOPICAL_CREAM | Freq: Two times a day (BID) | CUTANEOUS | 1 refills | Status: DC
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
## 2017-06-07 NOTE — Patient Instructions (Addendum)
Use carpal tunnel wrist splints (cocked up brace) for carpal tunnel symptoms  Recommend OTC antifungal foot powder to reduce sweating and improve fungus  Will call with lab results  May need further testing for hand numbness if it does not improve with brace OR it gets worse  Use topical cream to hands for palmer rash  Follow up with specialists as scheduled  Reduce alcohol intake to help with hand numbness  Follow up in 6 mos for hand numbness, HTN, HF, Etoh abuse   Athlete's Foot Athlete's foot (tinea pedis) is a fungal infection of the skin on the feet. It often occurs on the skin that is between or underneath the toes. It can also occur on the soles of the feet. The infection can spread from person to person (is contagious). Follow these instructions at home:  Apply or take over-the-counter and prescription medicines only as told by your doctor.  Keep all follow-up visits as told by your doctor. This is important.  Do not scratch your feet.  Keep your feet dry: ? Wear cotton or wool socks. Change your socks every day or if they become wet. ? Wear shoes that allow air to move around, such as sandals or canvas tennis shoes.  Wash and dry your feet: ? Every day or as told by your doctor. ? After exercising. ? Including the area between your toes.  Wear sandals in wet areas, such as locker rooms and shared showers.  Do not share any of these items: ? Towels. ? Nail clippers. ? Other personal items that touch your feet.  If you have diabetes, keep your blood sugar under control. Contact a doctor if:  You have a fever.  You have swelling, soreness, warmth, or redness in your foot.  You are not getting better with treatment.  Your symptoms get worse.  You have new symptoms. This information is not intended to replace advice given to you by your health care provider. Make sure you discuss any questions you have with your health care provider. Document Released:  03/05/2008 Document Revised: 02/23/2016 Document Reviewed: 03/21/2015 Elsevier Interactive Patient Education  2018 [employer].

## 2017-06-08 LAB — CBC WITH DIFFERENTIAL/PLATELET
BASOS PCT: 1 %
Basophils Absolute: 31 cells/uL (ref 0–200)
EOS PCT: 7.4 %
Eosinophils Absolute: 229 cells/uL (ref 15–500)
HCT: 37.7 % — ABNORMAL LOW (ref 38.5–50.0)
Hemoglobin: 13.1 g/dL — ABNORMAL LOW (ref 13.2–17.1)
Lymphs Abs: 1367 cells/uL (ref 850–3900)
MCH: 33.7 pg — ABNORMAL HIGH (ref 27.0–33.0)
MCHC: 34.7 g/dL (ref 32.0–36.0)
MCV: 96.9 fL (ref 80.0–100.0)
MONOS PCT: 10 %
MPV: 10.3 fL (ref 7.5–12.5)
Neutro Abs: 1163 cells/uL — ABNORMAL LOW (ref 1500–7800)
Neutrophils Relative %: 37.5 %
PLATELETS: 216 10*3/uL (ref 140–400)
RBC: 3.89 10*6/uL — AB (ref 4.20–5.80)
RDW: 12.3 % (ref 11.0–15.0)
Total Lymphocyte: 44.1 %
WBC: 3.1 10*3/uL — AB (ref 3.8–10.8)
WBCMIX: 310 {cells}/uL (ref 200–950)

## 2017-06-08 LAB — HEPATIC FUNCTION PANEL
AG Ratio: 1.4 (calc) (ref 1.0–2.5)
ALKALINE PHOSPHATASE (APISO): 42 U/L (ref 40–115)
ALT: 17 U/L (ref 9–46)
AST: 22 U/L (ref 10–35)
Albumin: 3.9 g/dL (ref 3.6–5.1)
BILIRUBIN DIRECT: 0.1 mg/dL (ref 0.0–0.2)
BILIRUBIN INDIRECT: 0.4 mg/dL (ref 0.2–1.2)
GLOBULIN: 2.7 g/dL (ref 1.9–3.7)
TOTAL PROTEIN: 6.6 g/dL (ref 6.1–8.1)
Total Bilirubin: 0.5 mg/dL (ref 0.2–1.2)

## 2017-06-08 LAB — LIPID PANEL
CHOLESTEROL: 211 mg/dL — AB (ref <200)
HDL: 124 mg/dL (ref >40)
LDL Cholesterol (Calc): 73 mg/dL (calc)
Non-HDL Cholesterol (Calc): 87 mg/dL (calc) (ref <130)
TRIGLYCERIDES: 49 mg/dL (ref <150)
Total CHOL/HDL Ratio: 1.7 (calc) (ref <5.0)

## 2017-06-08 LAB — HIV ANTIBODY (ROUTINE TESTING W REFLEX): HIV 1&2 Ab, 4th Generation: NONREACTIVE

## 2017-06-08 LAB — TSH: TSH: 1.47 mIU/L (ref 0.40–4.50)

## 2017-06-08 LAB — HEPATITIS C ANTIBODY
HEP C AB: NONREACTIVE
SIGNAL TO CUT-OFF: 0.04 (ref <1.00)

## 2017-06-20 ENCOUNTER — Encounter: Payer: Self-pay | Admitting: Internal Medicine

## 2017-06-28 ENCOUNTER — Telehealth: Payer: Self-pay

## 2017-06-28 NOTE — Telephone Encounter (Signed)
GREAT 

## 2017-06-28 NOTE — Telephone Encounter (Signed)
Mrs Laural Benes called and stated that patient is ready to go into rehab for alcohol dependence. She wonders if Dr. Montez Morita may have any advise on how to get patient into a rehab. I told Mrs. Johnson to call or look up several addiction programs in the area and see what would work best for patient. I explained that entering an addiction program had to be a voluntary act on part of the patient. She stated that she understood.   I gave Mrs. Laural Benes the number to [facility] [phone], so that she could get more information on how the addition programs worked.

## 2017-10-22 ENCOUNTER — Other Ambulatory Visit: Payer: Self-pay | Admitting: Internal Medicine

## 2017-11-08 ENCOUNTER — Other Ambulatory Visit: Payer: Self-pay

## 2017-11-08 DIAGNOSIS — R112 Nausea with vomiting, unspecified: Secondary | ICD-10-CM | POA: Diagnosis present

## 2017-11-08 DIAGNOSIS — Z5321 Procedure and treatment not carried out due to patient leaving prior to being seen by health care provider: Secondary | ICD-10-CM | POA: Insufficient documentation

## 2017-11-08 LAB — COMPREHENSIVE METABOLIC PANEL
ALT: 29 U/L (ref 17–63)
ANION GAP: 14 (ref 5–15)
AST: 40 U/L (ref 15–41)
Albumin: 4.3 g/dL (ref 3.5–5.0)
Alkaline Phosphatase: 40 U/L (ref 38–126)
BILIRUBIN TOTAL: 0.8 mg/dL (ref 0.3–1.2)
BUN: 9 mg/dL (ref 6–20)
CHLORIDE: 102 mmol/L (ref 101–111)
CO2: 24 mmol/L (ref 22–32)
Calcium: 9.3 mg/dL (ref 8.9–10.3)
Creatinine, Ser: 0.72 mg/dL (ref 0.61–1.24)
GFR calc Af Amer: >60 mL/min (ref >60)
GFR calc non Af Amer: >60 mL/min (ref >60)
GLUCOSE: 89 mg/dL (ref 65–99)
POTASSIUM: 3.2 mmol/L — AB (ref 3.5–5.1)
SODIUM: 140 mmol/L (ref 135–145)
TOTAL PROTEIN: 7.4 g/dL (ref 6.5–8.1)

## 2017-11-08 LAB — CBC
HEMATOCRIT: 42.6 % (ref 39.0–52.0)
Hemoglobin: 14.9 g/dL (ref 13.0–17.0)
MCH: 33.8 pg (ref 26.0–34.0)
MCHC: 35 g/dL (ref 30.0–36.0)
MCV: 96.6 fL (ref 78.0–100.0)
PLATELETS: 222 10*3/uL (ref 150–400)
RBC: 4.41 MIL/uL (ref 4.22–5.81)
RDW: 11.9 % (ref 11.5–15.5)
WBC: 8.2 10*3/uL (ref 4.0–10.5)

## 2017-11-08 LAB — LIPASE, BLOOD: LIPASE: 54 U/L — AB (ref 11–51)

## 2017-11-08 NOTE — ED Triage Notes (Signed)
Pt has been having abdominal pain with nausea and vomiting today. Family reports fever at home. Pt reports feeling cold.

## 2017-11-09 ENCOUNTER — Emergency Department (HOSPITAL_COMMUNITY)
Admission: EM | Admit: 2017-11-09 | Discharge: 2017-11-09 | Disposition: A | Discharge status: Home / Self Care | Payer: Medicare HMO | Attending: Emergency Medicine | Admitting: Emergency Medicine

## 2017-11-09 NOTE — ED Notes (Signed)
No answer for treatment room. 

## 2017-11-12 ENCOUNTER — Ambulatory Visit (INDEPENDENT_AMBULATORY_CARE_PROVIDER_SITE_OTHER): Payer: Medicare HMO | Admitting: Nurse Practitioner

## 2017-11-12 ENCOUNTER — Encounter: Payer: Self-pay | Admitting: Nurse Practitioner

## 2017-11-12 VITALS — BP 156/98 | HR 67 | Temp 98.7°F | Ht 72.0 in | Wt 169.0 lb

## 2017-11-12 DIAGNOSIS — F1020 Alcohol dependence, uncomplicated: Secondary | ICD-10-CM | POA: Diagnosis not present

## 2017-11-12 DIAGNOSIS — E876 Hypokalemia: Secondary | ICD-10-CM | POA: Diagnosis not present

## 2017-11-12 DIAGNOSIS — I10 Essential (primary) hypertension: Secondary | ICD-10-CM | POA: Diagnosis not present

## 2017-11-12 DIAGNOSIS — M545 Low back pain, unspecified: Secondary | ICD-10-CM

## 2017-11-12 DIAGNOSIS — R69 Illness, unspecified: Secondary | ICD-10-CM | POA: Diagnosis not present

## 2017-11-12 LAB — BASIC METABOLIC PANEL WITH GFR
BUN: 11 mg/dL (ref 7–25)
CO2: 27 mmol/L (ref 20–32)
CREATININE: 0.84 mg/dL (ref 0.70–1.25)
Calcium: 9.5 mg/dL (ref 8.6–10.3)
Chloride: 103 mmol/L (ref 98–110)
GFR, EST NON AFRICAN AMERICAN: 91 mL/min/{1.73_m2} (ref >=60)
GFR, Est African American: 106 mL/min/{1.73_m2} (ref >=60)
GLUCOSE: 77 mg/dL (ref 65–139)
POTASSIUM: 4 mmol/L (ref 3.5–5.3)
SODIUM: 138 mmol/L (ref 135–146)

## 2017-11-12 MED ORDER — PREDNISONE 10 MG (21) PO TBPK: ORAL_TABLET | ORAL | 0 refills | Status: DC
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
## 2017-11-12 NOTE — Patient Instructions (Addendum)
To use heat to lower back three times daily  After heat apply muscle rub three times daily Start prednisone taper as directed To get xray of lumbar spine Do not take prescription pain pills while drinking alcohol Recommended to cut back on alcohol/beer  To follow up with Dr Montez Morita in 1 month

## 2017-11-15 ENCOUNTER — Ambulatory Visit: Payer: Medicare HMO | Admitting: Internal Medicine

## 2017-11-15 ENCOUNTER — Encounter: Payer: Self-pay | Admitting: Internal Medicine

## 2017-11-15 VITALS — BP 120/74 | HR 63 | Ht 72.0 in | Wt 172.0 lb

## 2017-11-15 DIAGNOSIS — R69 Illness, unspecified: Secondary | ICD-10-CM | POA: Diagnosis not present

## 2017-11-15 DIAGNOSIS — I10 Essential (primary) hypertension: Secondary | ICD-10-CM

## 2017-11-15 DIAGNOSIS — F1010 Alcohol abuse, uncomplicated: Secondary | ICD-10-CM | POA: Diagnosis not present

## 2017-11-15 DIAGNOSIS — I5022 Chronic systolic (congestive) heart failure: Secondary | ICD-10-CM

## 2017-11-15 LAB — BASIC METABOLIC PANEL
BUN/Creatinine Ratio: 15 (ref 10–24)
BUN: 13 mg/dL (ref 8–27)
CALCIUM: 9.3 mg/dL (ref 8.6–10.2)
CO2: 25 mmol/L (ref 20–29)
CREATININE: 0.89 mg/dL (ref 0.76–1.27)
Chloride: 104 mmol/L (ref 96–106)
GFR calc Af Amer: 103 mL/min/{1.73_m2} (ref >59)
GFR calc non Af Amer: 89 mL/min/{1.73_m2} (ref >59)
Glucose: 95 mg/dL (ref 65–99)
Potassium: 4.9 mmol/L (ref 3.5–5.2)
Sodium: 141 mmol/L (ref 134–144)

## 2017-11-15 NOTE — Patient Instructions (Signed)
Your physician recommends that you continue on your current medications as directed. Please refer to the Current Medication list given to you today. Your physician recommends that you return for lab work today (BMET).  [facility] [phone]  Call to schedule appointment for recent fall.
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
## 2017-12-03 ENCOUNTER — Ambulatory Visit: Payer: Medicare HMO | Admitting: Family Medicine

## 2017-12-03 ENCOUNTER — Encounter: Payer: Self-pay | Admitting: Family Medicine

## 2017-12-03 ENCOUNTER — Ambulatory Visit: Payer: Self-pay

## 2017-12-03 ENCOUNTER — Ambulatory Visit (INDEPENDENT_AMBULATORY_CARE_PROVIDER_SITE_OTHER)
Admission: RE | Admit: 2017-12-03 | Discharge: 2017-12-03 | Disposition: A | Discharge status: Home / Self Care | Payer: Medicare HMO | Source: Ambulatory Visit | Attending: Family Medicine | Admitting: Family Medicine

## 2017-12-03 VITALS — BP 124/66 | HR 69 | Ht 73.0 in | Wt 173.0 lb

## 2017-12-03 DIAGNOSIS — M25551 Pain in right hip: Secondary | ICD-10-CM | POA: Diagnosis not present

## 2017-12-03 DIAGNOSIS — M1611 Unilateral primary osteoarthritis, right hip: Secondary | ICD-10-CM | POA: Diagnosis not present

## 2017-12-03 DIAGNOSIS — M545 Low back pain: Secondary | ICD-10-CM | POA: Diagnosis not present

## 2017-12-03 MED ORDER — GABAPENTIN 100 MG PO CAPS
200.0000 mg | ORAL_CAPSULE | Freq: Every day | ORAL | 3 refills | Status: DC
Start: 2017-12-03 — End: 2018-01-03

## 2017-12-03 NOTE — Assessment & Plan Note (Signed)
Right hip pain.  Likely more of a gluteal tendinitis.  We discussed all that this could be lumbar radiculopathy.  We discussed the possibility of injections, formal physical therapy which patient declined both.  We discussed icing regimen, home exercise, which activities to do which wants to avoid.  Patient was to increase activity slowly over the course the next several days.  Patient will follow up with me again 4-6 weeks.  At that time worsening pain we will consider the other conservative measures were discussed previously.

## 2017-12-03 NOTE — Patient Instructions (Addendum)
Good to see you  Xray of back and hip today downstairs.  I think more of a gluteal tendonitis.  Ice 20 minutes 2 times daily. Usually after activity and before bed. Exercises 3 times a week.  pennsaid pinkie amount topically 2 times daily as needed.  Gabapentin 200mg  at night Over the counter Vitamin D 2000 IU daily  See me again in 4 weeks

## 2017-12-05 ENCOUNTER — Ambulatory Visit: Payer: Medicare HMO | Admitting: Nurse Practitioner

## 2017-12-06 ENCOUNTER — Encounter: Payer: Self-pay | Admitting: Internal Medicine

## 2017-12-06 ENCOUNTER — Ambulatory Visit (INDEPENDENT_AMBULATORY_CARE_PROVIDER_SITE_OTHER): Payer: Medicare HMO | Admitting: Internal Medicine

## 2017-12-06 VITALS — BP 148/90 | HR 71 | Temp 97.9°F | Ht 73.0 in | Wt 174.0 lb

## 2017-12-06 DIAGNOSIS — G4700 Insomnia, unspecified: Secondary | ICD-10-CM | POA: Diagnosis not present

## 2017-12-06 DIAGNOSIS — I5022 Chronic systolic (congestive) heart failure: Secondary | ICD-10-CM | POA: Diagnosis not present

## 2017-12-06 DIAGNOSIS — F1020 Alcohol dependence, uncomplicated: Secondary | ICD-10-CM

## 2017-12-06 DIAGNOSIS — I10 Essential (primary) hypertension: Secondary | ICD-10-CM | POA: Diagnosis not present

## 2017-12-06 DIAGNOSIS — R69 Illness, unspecified: Secondary | ICD-10-CM | POA: Diagnosis not present

## 2017-12-06 DIAGNOSIS — M25551 Pain in right hip: Secondary | ICD-10-CM

## 2017-12-06 MED ORDER — ZOSTER VAC RECOMB ADJUVANTED 50 MCG/0.5ML IM SUSR: 0.5000 mL | Freq: Once | INTRAMUSCULAR | 1 refills | Status: AC

## 2017-12-06 MED ORDER — ZOSTER VAC RECOMB ADJUVANTED 50 MCG/0.5ML IM SUSR: 0.5000 mL | Freq: Once | INTRAMUSCULAR | 1 refills | Status: DC
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
## 2017-12-06 NOTE — Patient Instructions (Addendum)
Continue current medications as ordered  Follow up with Orthopedics Dr Katrinka Blazing as scheduled  Follow up with heart specialist Dr Tenny Craw as scheduled  Alcohol use cessation discussed and highly urged  Follow up in 6 mos for AWV/CPE. Fasting labs prior to appt

## 2018-01-03 ENCOUNTER — Ambulatory Visit: Payer: Medicare HMO | Admitting: Family Medicine

## 2018-01-03 ENCOUNTER — Encounter: Payer: Self-pay | Admitting: Family Medicine

## 2018-01-03 DIAGNOSIS — M5416 Radiculopathy, lumbar region: Secondary | ICD-10-CM

## 2018-01-03 MED ORDER — GABAPENTIN 300 MG PO CAPS: ORAL_CAPSULE | ORAL | 3 refills | Status: DC

## 2018-01-03 NOTE — Assessment & Plan Note (Signed)
More of a right-sided lumbar radiculopathy.  No weakness still noted.  Patient having worsening pain increase gabapentin 300 mg.  Discussed the possibility of prednisone which patient actually declined at the moment.  We discussed icing regimen, home exercise.  Patient declined physical therapy at this time.  We will follow-up again in 2-3 weeks

## 2018-01-03 NOTE — Patient Instructions (Signed)
Good to see you  Increased gabapentin to 300mg  at night B complex over the counter daily could help Lets also try a magnesium oil you can rub on the leg before bed Continue the exercises  See me again in 6 weeks to make sure we are making progress.
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
## 2018-02-18 ENCOUNTER — Ambulatory Visit: Payer: Self-pay | Admitting: Family Medicine

## 2018-02-18 DIAGNOSIS — Z0289 Encounter for other administrative examinations: Secondary | ICD-10-CM

## 2018-03-31 ENCOUNTER — Telehealth: Payer: Self-pay | Admitting: *Deleted

## 2018-03-31 NOTE — Telephone Encounter (Signed)
I called [name], patient's sister and informed her of Dr.Carter's response. [name] appeared shocked at [facility] response and did not feel comfortable sharing this information with her brother for it would upset him.  I offered to contact patient directly and [facility] agreed, response would be better coming directly from [name].  I called [name] @ [phone] and he was very upset with response. [name] states he can not read but he does not have memory loss and plenty of people that can not read can handle their own financial affairs. [name] would like for Dr.Carter to reconsider.   MMSE did indicate that patient was unable to read or write  Please advise

## 2018-03-31 NOTE — Telephone Encounter (Signed)
[name] [facility]) called and said that patient would like to start managing his own money. She said they went to the Social Security office today and due to the fact that she is legally his Publishing rights manager due to his cognition, he will need to provide a written statement showing he is mentally capable of managing his own affairs. Does he need an appt or will you just write the letter? Last OV 12/06/17.

## 2018-03-31 NOTE — Telephone Encounter (Signed)
The answer is NO to writing a letter as he has moderate dementia; NO he does not need another appt before already scheduled appt

## 2018-03-31 NOTE — Telephone Encounter (Signed)
Last MMSE 14/30 on 05/24/2017  Pending appointment 06/11/18

## 2018-04-02 NOTE — Telephone Encounter (Signed)
I discussed additional response with patient, and he was still dissatisfied and responded "Whatever"   I also contacted Corrie Dandy, patient's Aunt and informed her of Dr.Carter's final decision

## 2018-04-02 NOTE — Telephone Encounter (Signed)
Noted - I said no because of his memory loss problems not because he cannot read or write. He drinks heavily which plays a role in his memory loss. I do not think he can manage his affairs because of the drinking and alcohol abuse.

## 2018-04-13 ENCOUNTER — Other Ambulatory Visit: Payer: Self-pay | Admitting: Internal Medicine

## 2018-04-13 DIAGNOSIS — I5022 Chronic systolic (congestive) heart failure: Secondary | ICD-10-CM

## 2018-04-13 DIAGNOSIS — Z79899 Other long term (current) drug therapy: Secondary | ICD-10-CM

## 2018-04-13 DIAGNOSIS — F1020 Alcohol dependence, uncomplicated: Secondary | ICD-10-CM

## 2018-05-21 ENCOUNTER — Encounter: Payer: Self-pay | Admitting: Internal Medicine

## 2018-05-25 ENCOUNTER — Other Ambulatory Visit: Payer: Self-pay | Admitting: Physician Assistant

## 2018-05-25 ENCOUNTER — Other Ambulatory Visit: Payer: Self-pay | Admitting: Internal Medicine

## 2018-05-25 DIAGNOSIS — I5022 Chronic systolic (congestive) heart failure: Secondary | ICD-10-CM

## 2018-05-30 ENCOUNTER — Other Ambulatory Visit: Payer: Self-pay

## 2018-05-30 ENCOUNTER — Ambulatory Visit (INDEPENDENT_AMBULATORY_CARE_PROVIDER_SITE_OTHER): Payer: Medicare HMO

## 2018-05-30 VITALS — BP 154/68 | HR 77 | Temp 97.9°F | Ht 73.0 in | Wt 169.0 lb

## 2018-05-30 DIAGNOSIS — Z0000 Encounter for general adult medical examination without abnormal findings: Secondary | ICD-10-CM

## 2018-05-30 DIAGNOSIS — Z23 Encounter for immunization: Secondary | ICD-10-CM

## 2018-05-30 DIAGNOSIS — I5022 Chronic systolic (congestive) heart failure: Secondary | ICD-10-CM

## 2018-05-30 DIAGNOSIS — R69 Illness, unspecified: Secondary | ICD-10-CM | POA: Diagnosis not present

## 2018-05-30 DIAGNOSIS — Z79899 Other long term (current) drug therapy: Secondary | ICD-10-CM

## 2018-05-30 DIAGNOSIS — F1020 Alcohol dependence, uncomplicated: Secondary | ICD-10-CM

## 2018-05-30 LAB — COMPLETE METABOLIC PANEL WITH GFR
AG Ratio: 1.6 (calc) (ref 1.0–2.5)
ALBUMIN MSPROF: 4.4 g/dL (ref 3.6–5.1)
ALT: 18 U/L (ref 9–46)
AST: 24 U/L (ref 10–35)
Alkaline phosphatase (APISO): 40 U/L (ref 40–115)
BUN: 11 mg/dL (ref 7–25)
CALCIUM: 9.5 mg/dL (ref 8.6–10.3)
CO2: 25 mmol/L (ref 20–32)
CREATININE: 0.82 mg/dL (ref 0.70–1.25)
Chloride: 104 mmol/L (ref 98–110)
GFR, EST NON AFRICAN AMERICAN: 92 mL/min/{1.73_m2} (ref >=60)
GFR, Est African American: 107 mL/min/{1.73_m2} (ref >=60)
GLOBULIN: 2.7 g/dL (ref 1.9–3.7)
Glucose, Bld: 94 mg/dL (ref 65–99)
POTASSIUM: 3.7 mmol/L (ref 3.5–5.3)
Sodium: 137 mmol/L (ref 135–146)
Total Bilirubin: 0.5 mg/dL (ref 0.2–1.2)
Total Protein: 7.1 g/dL (ref 6.1–8.1)

## 2018-05-30 LAB — LIPID PANEL
CHOLESTEROL: 240 mg/dL — AB (ref <200)
HDL: 116 mg/dL (ref >40)
LDL Cholesterol (Calc): 110 mg/dL (calc) — ABNORMAL HIGH
Non-HDL Cholesterol (Calc): 124 mg/dL (calc) (ref <130)
Total CHOL/HDL Ratio: 2.1 (calc) (ref <5.0)
Triglycerides: 54 mg/dL (ref <150)

## 2018-05-30 MED ORDER — CARVEDILOL 3.125 MG PO TABS: 3.1250 mg | ORAL_TABLET | Freq: Two times a day (BID) | ORAL | 3 refills | Status: DC

## 2018-05-30 MED ORDER — TRAZODONE HCL 100 MG PO TABS: 100.0000 mg | ORAL_TABLET | Freq: Every evening | ORAL | 3 refills | Status: DC | PRN

## 2018-05-30 MED ORDER — LISINOPRIL 20 MG PO TABS: 20.0000 mg | ORAL_TABLET | Freq: Every evening | ORAL | 6 refills | Status: DC

## 2018-05-30 NOTE — Patient Instructions (Signed)
[name] , Thank you for taking time to come for your Medicare Wellness Visit. I appreciate your ongoing commitment to your health goals. Please review the following plan we discussed and let me know if I can assist you in the future.   Screening recommendations/referrals: Colonoscopy due. Cologuard information filled out Recommended yearly ophthalmology/optometry visit for glaucoma screening and checkup Recommended yearly dental visit for hygiene and checkup  Vaccinations: Influenza vaccine given today Pneumococcal vaccine 23 given today. Completed Tdap vaccine up to date, due 06/11/2025 Shingles vaccine due, please check cost at pharmacy    Advanced directives: Advance directive discussed with you today. I have provided a copy for you to complete at home and have notarized. Once this is complete please bring a copy in to our office so we can scan it into your chart.  Conditions/risks identified: none  Next appointment: Dr. Montez Morita 06/11/2018 @ 9:45am            Tyron Russell, RN 06/03/2019 @ 11:00am  Preventive Care 65 Years and Older, Male Preventive care refers to lifestyle choices and visits with your health care provider that can promote health and wellness. What does preventive care include?  A yearly physical exam. This is also called an annual well check.  Dental exams once or twice a year.  Routine eye exams. Ask your health care provider how often you should have your eyes checked.  Personal lifestyle choices, including:  Daily care of your teeth and gums.  Regular physical activity.  Eating a healthy diet.  Avoiding tobacco and drug use.  Limiting alcohol use.  Practicing safe sex.  Taking low doses of aspirin every day.  Taking vitamin and mineral supplements as recommended by your health care provider. What happens during an annual well check? The services and screenings done by your health care provider during your annual well check will depend on your  age, overall health, lifestyle risk factors, and family history of disease. Counseling  Your health care provider may ask you questions about your:  Alcohol use.  Tobacco use.  Drug use.  Emotional well-being.  Home and relationship well-being.  Sexual activity.  Eating habits.  History of falls.  Memory and ability to understand (cognition).  Work and work Astronomer. Screening  You may have the following tests or measurements:  Height, weight, and BMI.  Blood pressure.  Lipid and cholesterol levels. These may be checked every 5 years, or more frequently if you are over 48 years old.  Skin check.  Lung cancer screening. You may have this screening every year starting at age 33 if you have a 30-pack-year history of smoking and currently smoke or have quit within the past 15 years.  Fecal occult blood test (FOBT) of the stool. You may have this test every year starting at age 29.  Flexible sigmoidoscopy or colonoscopy. You may have a sigmoidoscopy every 5 years or a colonoscopy every 10 years starting at age 28.  Prostate cancer screening. Recommendations will vary depending on your family history and other risks.  Hepatitis C blood test.  Hepatitis B blood test.  Sexually transmitted disease (STD) testing.  Diabetes screening. This is done by checking your blood sugar (glucose) after you have not eaten for a while (fasting). You may have this done every 1-3 years.  Abdominal aortic aneurysm (AAA) screening. You may need this if you are a current or former smoker.  Osteoporosis. You may be screened starting at age 46 if you are at high  risk. Talk with your health care provider about your test results, treatment options, and if necessary, the need for more tests. Vaccines  Your health care provider may recommend certain vaccines, such as:  Influenza vaccine. This is recommended every year.  Tetanus, diphtheria, and acellular pertussis (Tdap, Td) vaccine. You  may need a Td booster every 10 years.  Zoster vaccine. You may need this after age 34.  Pneumococcal 13-valent conjugate (PCV13) vaccine. One dose is recommended after age 59.  Pneumococcal polysaccharide (PPSV23) vaccine. One dose is recommended after age 73. Talk to your health care provider about which screenings and vaccines you need and how often you need them. This information is not intended to replace advice given to you by your health care provider. Make sure you discuss any questions you have with your health care provider. Document Released: 10/14/2015 Document Revised: 06/06/2016 Document Reviewed: 07/19/2015 Elsevier Interactive Patient Education  2017 [employer].  Fall Prevention in the Home Falls can cause injuries. They can happen to people of all ages. There are many things you can do to make your home safe and to help prevent falls. What can I do on the outside of my home?  Regularly fix the edges of walkways and driveways and fix any cracks.  Remove anything that might make you trip as you walk through a door, such as a raised step or threshold.  Trim any bushes or trees on the path to your home.  Use bright outdoor lighting.  Clear any walking paths of anything that might make someone trip, such as rocks or tools.  Regularly check to see if handrails are loose or broken. Make sure that both sides of any steps have handrails.  Any raised decks and porches should have guardrails on the edges.  Have any leaves, snow, or ice cleared regularly.  Use sand or salt on walking paths during winter.  Clean up any spills in your garage right away. This includes oil or grease spills. What can I do in the bathroom?  Use night lights.  Install grab bars by the toilet and in the tub and shower. Do not use towel bars as grab bars.  Use non-skid mats or decals in the tub or shower.  If you need to sit down in the shower, use a plastic, non-slip stool.  Keep the floor  dry. Clean up any water that spills on the floor as soon as it happens.  Remove soap buildup in the tub or shower regularly.  Attach bath mats securely with double-sided non-slip rug tape.  Do not have throw rugs and other things on the floor that can make you trip. What can I do in the bedroom?  Use night lights.  Make sure that you have a light by your bed that is easy to reach.  Do not use any sheets or blankets that are too big for your bed. They should not hang down onto the floor.  Have a firm chair that has side arms. You can use this for support while you get dressed.  Do not have throw rugs and other things on the floor that can make you trip. What can I do in the kitchen?  Clean up any spills right away.  Avoid walking on wet floors.  Keep items that you use a lot in easy-to-reach places.  If you need to reach something above you, use a strong step stool that has a grab bar.  Keep electrical cords out of the way.  Do not use floor polish or wax that makes floors slippery. If you must use wax, use non-skid floor wax.  Do not have throw rugs and other things on the floor that can make you trip. What can I do with my stairs?  Do not leave any items on the stairs.  Make sure that there are handrails on both sides of the stairs and use them. Fix handrails that are broken or loose. Make sure that handrails are as long as the stairways.  Check any carpeting to make sure that it is firmly attached to the stairs. Fix any carpet that is loose or worn.  Avoid having throw rugs at the top or bottom of the stairs. If you do have throw rugs, attach them to the floor with carpet tape.  Make sure that you have a light switch at the top of the stairs and the bottom of the stairs. If you do not have them, ask someone to add them for you. What else can I do to help prevent falls?  Wear shoes that:  Do not have high heels.  Have rubber bottoms.  Are comfortable and fit you  well.  Are closed at the toe. Do not wear sandals.  If you use a stepladder:  Make sure that it is fully opened. Do not climb a closed stepladder.  Make sure that both sides of the stepladder are locked into place.  Ask someone to hold it for you, if possible.  Clearly mark and make sure that you can see:  Any grab bars or handrails.  First and last steps.  Where the edge of each step is.  Use tools that help you move around (mobility aids) if they are needed. These include:  Canes.  Walkers.  Scooters.  Crutches.  Turn on the lights when you go into a dark area. Replace any light bulbs as soon as they burn out.  Set up your furniture so you have a clear path. Avoid moving your furniture around.  If any of your floors are uneven, fix them.  If there are any pets around you, be aware of where they are.  Review your medicines with your doctor. Some medicines can make you feel dizzy. This can increase your chance of falling. Ask your doctor what other things that you can do to help prevent falls. This information is not intended to replace advice given to you by your health care provider. Make sure you discuss any questions you have with your health care provider. Document Released: 07/14/2009 Document Revised: 02/23/2016 Document Reviewed: 10/22/2014 Elsevier Interactive Patient Education  2017 [employer].
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
## 2018-05-31 ENCOUNTER — Other Ambulatory Visit: Payer: Self-pay

## 2018-05-31 ENCOUNTER — Emergency Department (HOSPITAL_COMMUNITY)
Admission: EM | Admit: 2018-05-31 | Discharge: 2018-05-31 | Disposition: A | Discharge status: Home / Self Care | Payer: Medicare HMO | Attending: Emergency Medicine | Admitting: Emergency Medicine

## 2018-05-31 ENCOUNTER — Encounter (HOSPITAL_COMMUNITY): Payer: Self-pay | Admitting: *Deleted

## 2018-05-31 ENCOUNTER — Emergency Department (HOSPITAL_COMMUNITY): Payer: Medicare HMO

## 2018-05-31 DIAGNOSIS — I499 Cardiac arrhythmia, unspecified: Secondary | ICD-10-CM | POA: Diagnosis not present

## 2018-05-31 DIAGNOSIS — I4510 Unspecified right bundle-branch block: Secondary | ICD-10-CM | POA: Diagnosis not present

## 2018-05-31 DIAGNOSIS — R079 Chest pain, unspecified: Secondary | ICD-10-CM | POA: Diagnosis not present

## 2018-05-31 DIAGNOSIS — Z7982 Long term (current) use of aspirin: Secondary | ICD-10-CM | POA: Diagnosis not present

## 2018-05-31 DIAGNOSIS — Z79899 Other long term (current) drug therapy: Secondary | ICD-10-CM | POA: Diagnosis not present

## 2018-05-31 DIAGNOSIS — R0789 Other chest pain: Secondary | ICD-10-CM | POA: Diagnosis not present

## 2018-05-31 DIAGNOSIS — F17210 Nicotine dependence, cigarettes, uncomplicated: Secondary | ICD-10-CM | POA: Diagnosis not present

## 2018-05-31 DIAGNOSIS — I440 Atrioventricular block, first degree: Secondary | ICD-10-CM | POA: Diagnosis not present

## 2018-05-31 DIAGNOSIS — I5022 Chronic systolic (congestive) heart failure: Secondary | ICD-10-CM | POA: Diagnosis not present

## 2018-05-31 DIAGNOSIS — I110 Hypertensive heart disease with heart failure: Secondary | ICD-10-CM | POA: Insufficient documentation

## 2018-05-31 DIAGNOSIS — R69 Illness, unspecified: Secondary | ICD-10-CM | POA: Diagnosis not present

## 2018-05-31 LAB — BASIC METABOLIC PANEL
ANION GAP: 10 (ref 5–15)
BUN: 15 mg/dL (ref 8–23)
CHLORIDE: 104 mmol/L (ref 98–111)
CO2: 24 mmol/L (ref 22–32)
Calcium: 8.8 mg/dL — ABNORMAL LOW (ref 8.9–10.3)
Creatinine, Ser: 0.83 mg/dL (ref 0.61–1.24)
GFR calc Af Amer: >60 mL/min (ref >60)
GFR calc non Af Amer: >60 mL/min (ref >60)
GLUCOSE: 126 mg/dL — AB (ref 70–99)
POTASSIUM: 3.8 mmol/L (ref 3.5–5.1)
Sodium: 138 mmol/L (ref 135–145)

## 2018-05-31 LAB — I-STAT TROPONIN, ED: Troponin i, poc: 0.02 ng/mL (ref 0.00–0.08)

## 2018-05-31 LAB — CBC
HEMATOCRIT: 40.9 % (ref 39.0–52.0)
HEMOGLOBIN: 13.6 g/dL (ref 13.0–17.0)
MCH: 33.5 pg (ref 26.0–34.0)
MCHC: 33.3 g/dL (ref 30.0–36.0)
MCV: 100.7 fL — ABNORMAL HIGH (ref 78.0–100.0)
Platelets: 192 10*3/uL (ref 150–400)
RBC: 4.06 MIL/uL — ABNORMAL LOW (ref 4.22–5.81)
RDW: 11.9 % (ref 11.5–15.5)
WBC: 3.7 10*3/uL — ABNORMAL LOW (ref 4.0–10.5)

## 2018-05-31 NOTE — Discharge Instructions (Addendum)
Follow-up with your family doctor next week for recheck. 
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
## 2018-05-31 NOTE — ED Triage Notes (Addendum)
PT here via [facility] from home for cp with exertion since yesterday, when he was walking.  Denies pain presently.  Given 325 asa.  18 G in R upper arm.

## 2018-06-06 ENCOUNTER — Telehealth: Payer: Self-pay

## 2018-06-06 NOTE — Telephone Encounter (Signed)
Contacted patient's aunt and gave her the contact info for his GI doctor and [facility] to get the cologuard results from 2017. Informed her to have it faxed over to [place] when received

## 2018-06-09 ENCOUNTER — Telehealth: Payer: Self-pay | Admitting: Gastroenterology

## 2018-06-09 ENCOUNTER — Other Ambulatory Visit: Payer: Medicare HMO

## 2018-06-09 NOTE — Telephone Encounter (Signed)
Spoke to patient's aunt, she let me know that Dr. Celene Skeen office is unable to see the cologuard tests or Dr. Myrtie Neither' recommendations. I have faxed this information to their office.

## 2018-06-11 ENCOUNTER — Encounter: Payer: Medicare HMO | Admitting: Internal Medicine

## 2018-06-11 NOTE — Telephone Encounter (Signed)
Spoke with aunt who confirmed that she got the results from the cologuard in 2017 and Mr. Corder will be due again in 09/2019

## 2018-06-20 ENCOUNTER — Telehealth: Payer: Self-pay

## 2018-06-20 DIAGNOSIS — E7800 Pure hypercholesterolemia, unspecified: Secondary | ICD-10-CM

## 2018-06-20 DIAGNOSIS — Z79899 Other long term (current) drug therapy: Secondary | ICD-10-CM

## 2018-06-20 MED ORDER — ROSUVASTATIN CALCIUM 5 MG PO TABS: 5.0000 mg | ORAL_TABLET | Freq: Every day | ORAL | 1 refills | Status: DC

## 2018-06-20 NOTE — Telephone Encounter (Signed)
-----   Message from [place], [place] sent at 06/18/2018 10:00 AM EDT ----- See result note below

## 2018-06-20 NOTE — Telephone Encounter (Signed)
Discussed results with patient's aunt Corrie Dandy after several attempts to contact patient with no success.  Per Dr.Carter: Cholesterol is worse - recommend you start cholesterol lowering medication especially in light of your heart history; start crestor 5mg  #30 take 1 tab po daily for cholesterol with 3 RF; reck fasting lipid panel and ALT in 1 month; other labs stable; continue other medications as ordered and follow up as scheduled  RX sent to pharmacy on file, confirmed pharmacy. Per [facility] request rx sent for 90 day supply. Corrie Dandy states patient will run out of meds and forget to get refills at times.  Appointment scheduled and future order placed to recheck labs in 1 month.  Per Mary's request, copy of labs mailed to her address

## 2018-06-24 ENCOUNTER — Telehealth: Payer: Self-pay

## 2018-06-24 NOTE — Telephone Encounter (Signed)
Patient called to say that the pharmacy told him that his generic crestor was going to cost him $45 and he cannot afford that. I called the pharmacy and was told that the rx was $45 for a 90 day supply and $15 for a 30 day supply.   I called patient and he verbalized understanding.

## 2018-07-09 ENCOUNTER — Ambulatory Visit (INDEPENDENT_AMBULATORY_CARE_PROVIDER_SITE_OTHER): Payer: Medicare HMO | Admitting: Nurse Practitioner

## 2018-07-09 ENCOUNTER — Encounter: Payer: Self-pay | Admitting: Nurse Practitioner

## 2018-07-09 VITALS — BP 110/76 | HR 56 | Temp 98.0°F | Ht 73.0 in | Wt 171.0 lb

## 2018-07-09 DIAGNOSIS — K5900 Constipation, unspecified: Secondary | ICD-10-CM | POA: Diagnosis not present

## 2018-07-09 DIAGNOSIS — F1020 Alcohol dependence, uncomplicated: Secondary | ICD-10-CM | POA: Diagnosis not present

## 2018-07-09 DIAGNOSIS — E7800 Pure hypercholesterolemia, unspecified: Secondary | ICD-10-CM

## 2018-07-09 DIAGNOSIS — R69 Illness, unspecified: Secondary | ICD-10-CM | POA: Diagnosis not present

## 2018-07-09 DIAGNOSIS — Z79899 Other long term (current) drug therapy: Secondary | ICD-10-CM

## 2018-07-09 DIAGNOSIS — I5022 Chronic systolic (congestive) heart failure: Secondary | ICD-10-CM

## 2018-07-09 DIAGNOSIS — Z0000 Encounter for general adult medical examination without abnormal findings: Secondary | ICD-10-CM

## 2018-07-09 LAB — LIPID PANEL
CHOL/HDL RATIO: 1.9 (calc) (ref <5.0)
Cholesterol: 168 mg/dL (ref <200)
HDL: 88 mg/dL (ref >40)
LDL CHOLESTEROL (CALC): 71 mg/dL
NON-HDL CHOLESTEROL (CALC): 80 mg/dL (ref <130)
Triglycerides: 32 mg/dL (ref <150)

## 2018-07-09 LAB — ALT: ALT: 18 U/L (ref 9–46)
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
## 2018-07-09 NOTE — Patient Instructions (Signed)
May use stool softener- COLACE 100 mg by mouth daily for constipation  Increase water intake    Constipation, Adult Constipation is when a person has fewer bowel movements in a week than normal, has difficulty having a bowel movement, or has stools that are dry, hard, or larger than normal. Constipation may be caused by an underlying condition. It may become worse with age if a person takes certain medicines and does not take in enough fluids. Follow these instructions at home: Eating and drinking   Eat foods that have a lot of fiber, such as fresh fruits and vegetables, whole grains, and beans.  Limit foods that are high in fat, low in fiber, or overly processed, such as french fries, hamburgers, cookies, candies, and soda.  Drink enough fluid to keep your urine clear or pale yellow. General instructions  Exercise regularly or as told by your health care provider.  Go to the restroom when you have the urge to go. Do not hold it in.  Take over-the-counter and prescription medicines only as told by your health care provider. These include any fiber supplements.  Practice pelvic floor retraining exercises, such as deep breathing while relaxing the lower abdomen and pelvic floor relaxation during bowel movements.  Watch your condition for any changes.  Keep all follow-up visits as told by your health care provider. This is important. Contact a health care provider if:  You have pain that gets worse.  You have a fever.  You do not have a bowel movement after 4 days.  You vomit.  You are not hungry.  You lose weight.  You are bleeding from the anus.  You have thin, pencil-like stools. Get help right away if:  You have a fever and your symptoms suddenly get worse.  You leak stool or have blood in your stool.  Your abdomen is bloated.  You have severe pain in your abdomen.  You feel dizzy or you faint. This information is not intended to replace advice given to you  by your health care provider. Make sure you discuss any questions you have with your health care provider. Document Released: 06/15/2004 Document Revised: 04/06/2016 Document Reviewed: 03/07/2016 Elsevier Interactive Patient Education  2018 [employer].    Health Maintenance, Male A healthy lifestyle and preventive care is important for your health and wellness. Ask your health care provider about what schedule of regular examinations is right for you. What should I know about weight and diet? Eat a Healthy Diet  Eat plenty of vegetables, fruits, whole grains, low-fat dairy products, and lean protein.  Do not eat a lot of foods high in solid fats, added sugars, or salt.  Maintain a Healthy Weight Regular exercise can help you achieve or maintain a healthy weight. You should:  Do at least 150 minutes of exercise each week. The exercise should increase your heart rate and make you sweat (moderate-intensity exercise).  Do strength-training exercises at least twice a week.  Watch Your Levels of Cholesterol and Blood Lipids  Have your blood tested for lipids and cholesterol every 5 years starting at 66 years of age. If you are at high risk for heart disease, you should start having your blood tested when you are 66 years old. You may need to have your cholesterol levels checked more often if: ? Your lipid or cholesterol levels are high. ? You are older than 66 years of age. ? You are at high risk for heart disease.  What should I know  about cancer screening? Many types of cancers can be detected early and may often be prevented. Lung Cancer  You should be screened every year for lung cancer if: ? You are a current smoker who has smoked for at least 30 years. ? You are a former smoker who has quit within the past 15 years.  Talk to your health care provider about your screening options, when you should start screening, and how often you should be screened.  Colorectal  Cancer  Routine colorectal cancer screening usually begins at 66 years of age and should be repeated every 5-10 years until you are 66 years old. You may need to be screened more often if early forms of precancerous polyps or small growths are found. Your health care provider may recommend screening at an earlier age if you have risk factors for colon cancer.  Your health care provider may recommend using home test kits to check for hidden blood in the stool.  A small camera at the end of a tube can be used to examine your colon (sigmoidoscopy or colonoscopy). This checks for the earliest forms of colorectal cancer.  Prostate and Testicular Cancer  Depending on your age and overall health, your health care provider may do certain tests to screen for prostate and testicular cancer.  Talk to your health care provider about any symptoms or concerns you have about testicular or prostate cancer.  Skin Cancer  Check your skin from head to toe regularly.  Tell your health care provider about any new moles or changes in moles, especially if: ? There is a change in a mole's size, shape, or color. ? You have a mole that is larger than a pencil eraser.  Always use sunscreen. Apply sunscreen liberally and repeat throughout the day.  Protect yourself by wearing long sleeves, pants, a wide-brimmed hat, and sunglasses when outside.  What should I know about heart disease, diabetes, and high blood pressure?  If you are 70-55 years of age, have your blood pressure checked every 3-5 years. If you are 11 years of age or older, have your blood pressure checked every year. You should have your blood pressure measured twice-once when you are at a hospital or clinic, and once when you are not at a hospital or clinic. Record the average of the two measurements. To check your blood pressure when you are not at a hospital or clinic, you can use: ? An automated blood pressure machine at a pharmacy. ? A home blood  pressure monitor.  Talk to your health care provider about your target blood pressure.  If you are between 6-95 years old, ask your health care provider if you should take aspirin to prevent heart disease.  Have regular diabetes screenings by checking your fasting blood sugar level. ? If you are at a normal weight and have a low risk for diabetes, have this test once every three years after the age of 19. ? If you are overweight and have a high risk for diabetes, consider being tested at a younger age or more often.  A one-time screening for abdominal aortic aneurysm (AAA) by ultrasound is recommended for men aged 65-75 years who are current or former smokers. What should I know about preventing infection? Hepatitis B If you have a higher risk for hepatitis B, you should be screened for this virus. Talk with your health care provider to find out if you are at risk for hepatitis B infection. Hepatitis C Blood testing is  recommended for:  Everyone born from [birth year] through 1964.  Anyone with known risk factors for hepatitis C.  Sexually Transmitted Diseases (STDs)  You should be screened each year for STDs including gonorrhea and chlamydia if: ? You are sexually active and are younger than 66 years of age. ? You are older than 66 years of age and your health care provider tells you that you are at risk for this type of infection. ? Your sexual activity has changed since you were last screened and you are at an increased risk for chlamydia or gonorrhea. Ask your health care provider if you are at risk.  Talk with your health care provider about whether you are at high risk of being infected with HIV. Your health care provider may recommend a prescription medicine to help prevent HIV infection.  What else can I do?  Schedule regular health, dental, and eye exams.  Stay current with your vaccines (immunizations).  Do not use any tobacco products, such as cigarettes, chewing tobacco, and  e-cigarettes. If you need help quitting, ask your health care provider.  Limit alcohol intake to no more than 2 drinks per day. One drink equals 12 ounces of beer, 5 ounces of wine, or 1 ounces of hard liquor.  Do not use street drugs.  Do not share needles.  Ask your health care provider for help if you need support or information about quitting drugs.  Tell your health care provider if you often feel depressed.  Tell your health care provider if you have ever been abused or do not feel safe at home. This information is not intended to replace advice given to you by your health care provider. Make sure you discuss any questions you have with your health care provider. Document Released: 03/15/2008 Document Revised: 05/16/2016 Document Reviewed: 06/21/2015 Elsevier Interactive Patient Education  [employer].

## 2018-07-22 ENCOUNTER — Other Ambulatory Visit: Payer: Self-pay

## 2018-10-16 ENCOUNTER — Ambulatory Visit (INDEPENDENT_AMBULATORY_CARE_PROVIDER_SITE_OTHER): Payer: Medicare HMO | Admitting: Nurse Practitioner

## 2018-10-16 ENCOUNTER — Encounter: Payer: Self-pay | Admitting: Nurse Practitioner

## 2018-10-16 VITALS — BP 130/90 | HR 68 | Temp 97.7°F | Ht 73.0 in | Wt 177.0 lb

## 2018-10-16 DIAGNOSIS — R69 Illness, unspecified: Secondary | ICD-10-CM | POA: Diagnosis not present

## 2018-10-16 DIAGNOSIS — M25551 Pain in right hip: Secondary | ICD-10-CM

## 2018-10-16 DIAGNOSIS — W19XXXA Unspecified fall, initial encounter: Secondary | ICD-10-CM | POA: Diagnosis not present

## 2018-10-16 DIAGNOSIS — I10 Essential (primary) hypertension: Secondary | ICD-10-CM

## 2018-10-16 DIAGNOSIS — F1020 Alcohol dependence, uncomplicated: Secondary | ICD-10-CM | POA: Diagnosis not present

## 2018-10-16 DIAGNOSIS — I5022 Chronic systolic (congestive) heart failure: Secondary | ICD-10-CM

## 2018-10-16 DIAGNOSIS — G4700 Insomnia, unspecified: Secondary | ICD-10-CM | POA: Diagnosis not present

## 2018-10-16 DIAGNOSIS — E785 Hyperlipidemia, unspecified: Secondary | ICD-10-CM | POA: Diagnosis not present

## 2018-10-16 LAB — CBC WITH DIFFERENTIAL/PLATELET
Absolute Monocytes: 238 cells/uL (ref 200–950)
BASOS PCT: 0.7 %
Basophils Absolute: 19 cells/uL (ref 0–200)
EOS PCT: 9.2 %
Eosinophils Absolute: 248 cells/uL (ref 15–500)
HCT: 40.1 % (ref 38.5–50.0)
Hemoglobin: 13.8 g/dL (ref 13.2–17.1)
Lymphs Abs: 1350 cells/uL (ref 850–3900)
MCH: 33.7 pg — ABNORMAL HIGH (ref 27.0–33.0)
MCHC: 34.4 g/dL (ref 32.0–36.0)
MCV: 97.8 fL (ref 80.0–100.0)
MONOS PCT: 8.8 %
MPV: 10 fL (ref 7.5–12.5)
NEUTROS ABS: 845 {cells}/uL — AB (ref 1500–7800)
Neutrophils Relative %: 31.3 %
PLATELETS: 281 10*3/uL (ref 140–400)
RBC: 4.1 10*6/uL — AB (ref 4.20–5.80)
RDW: 12.3 % (ref 11.0–15.0)
TOTAL LYMPHOCYTE: 50 %
WBC: 2.7 10*3/uL — AB (ref 3.8–10.8)

## 2018-10-16 LAB — COMPLETE METABOLIC PANEL WITH GFR
AG Ratio: 1.4 (calc) (ref 1.0–2.5)
ALBUMIN MSPROF: 4.2 g/dL (ref 3.6–5.1)
ALKALINE PHOSPHATASE (APISO): 45 U/L (ref 40–115)
ALT: 23 U/L (ref 9–46)
AST: 32 U/L (ref 10–35)
BUN: 11 mg/dL (ref 7–25)
CHLORIDE: 103 mmol/L (ref 98–110)
CO2: 29 mmol/L (ref 20–32)
Calcium: 9.7 mg/dL (ref 8.6–10.3)
Creat: 0.75 mg/dL (ref 0.70–1.25)
GFR, Est African American: 111 mL/min/{1.73_m2} (ref >=60)
GFR, Est Non African American: 96 mL/min/{1.73_m2} (ref >=60)
GLUCOSE: 81 mg/dL (ref 65–99)
Globulin: 2.9 g/dL (calc) (ref 1.9–3.7)
POTASSIUM: 4.2 mmol/L (ref 3.5–5.3)
SODIUM: 138 mmol/L (ref 135–146)
Total Bilirubin: 0.6 mg/dL (ref 0.2–1.2)
Total Protein: 7.1 g/dL (ref 6.1–8.1)

## 2018-10-16 NOTE — Patient Instructions (Addendum)
You need to make follow up appt with your Cardiologist   Dr. Pricilla Riffle, MD Address: [address] [place], [place], [place] [address] Phone: [phone]  Can use tylenol 500 mg 2 tablets every 8 hours as needed for hip pain. To notify if symptoms worsen or fail to improve.   To get labs today. Follow up in 5 months, make sure to follow up with cardiologist in the meantime
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
## 2018-10-24 ENCOUNTER — Other Ambulatory Visit: Payer: Self-pay

## 2018-10-24 DIAGNOSIS — D729 Disorder of white blood cells, unspecified: Secondary | ICD-10-CM

## 2018-11-21 ENCOUNTER — Other Ambulatory Visit: Payer: Medicare HMO

## 2018-12-25 ENCOUNTER — Other Ambulatory Visit: Payer: Self-pay

## 2018-12-25 ENCOUNTER — Ambulatory Visit (INDEPENDENT_AMBULATORY_CARE_PROVIDER_SITE_OTHER): Payer: Medicare HMO | Admitting: Nurse Practitioner

## 2018-12-25 ENCOUNTER — Encounter: Payer: Self-pay | Admitting: Nurse Practitioner

## 2018-12-25 DIAGNOSIS — M25551 Pain in right hip: Secondary | ICD-10-CM | POA: Diagnosis not present

## 2018-12-25 NOTE — Patient Instructions (Signed)
Aleve 220 mg by mouth twice daily with food for 7 days.  To use heating pad twice daily with muscle rub after heat To use tylenol 500 mg 2 tablets every 8 hours as needed for pain,
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
## 2019-02-13 ENCOUNTER — Encounter: Payer: Self-pay | Admitting: Nurse Practitioner

## 2019-02-13 ENCOUNTER — Ambulatory Visit (INDEPENDENT_AMBULATORY_CARE_PROVIDER_SITE_OTHER): Payer: Medicare HMO | Admitting: Nurse Practitioner

## 2019-02-13 ENCOUNTER — Other Ambulatory Visit: Payer: Self-pay

## 2019-02-13 DIAGNOSIS — M5416 Radiculopathy, lumbar region: Secondary | ICD-10-CM

## 2019-02-13 DIAGNOSIS — F1020 Alcohol dependence, uncomplicated: Secondary | ICD-10-CM

## 2019-02-13 DIAGNOSIS — K5909 Other constipation: Secondary | ICD-10-CM | POA: Diagnosis not present

## 2019-02-13 DIAGNOSIS — I5022 Chronic systolic (congestive) heart failure: Secondary | ICD-10-CM

## 2019-02-13 DIAGNOSIS — R3912 Poor urinary stream: Secondary | ICD-10-CM | POA: Diagnosis not present

## 2019-02-13 DIAGNOSIS — I10 Essential (primary) hypertension: Secondary | ICD-10-CM | POA: Diagnosis not present

## 2019-02-13 DIAGNOSIS — E785 Hyperlipidemia, unspecified: Secondary | ICD-10-CM

## 2019-02-13 DIAGNOSIS — N401 Enlarged prostate with lower urinary tract symptoms: Secondary | ICD-10-CM

## 2019-02-13 DIAGNOSIS — R69 Illness, unspecified: Secondary | ICD-10-CM | POA: Diagnosis not present

## 2019-02-13 NOTE — Patient Instructions (Addendum)
You are over due for follow up with your heart doctor Please call Cardiologist and make appt.  Christopher Pates, MD    [facility] [address] [place], [place], [place]  [address] Phone: [phone]; Fax: [phone]   To follow up with Sports Medicine if you are still having trouble with your hip.   To take blood pressure at home and notify if blood pressure over 140/90  To cut back on drinking alcohol. Ideally it would be best to stop drinking alcohol.  Increase water intake.    DASH Eating Plan DASH stands for "Dietary Approaches to Stop Hypertension." The DASH eating plan is a healthy eating plan that has been shown to reduce high blood pressure (hypertension). It may also reduce your risk for type 2 diabetes, heart disease, and stroke. The DASH eating plan may also help with weight loss. What are tips for following this plan?  General guidelines  Avoid eating more than 2,300 mg (milligrams) of salt (sodium) a day. If you have hypertension, you may need to reduce your sodium intake to 1,500 mg a day.  Limit alcohol intake to no more than 1 drink a day for nonpregnant women and 2 drinks a day for men. One drink equals 12 oz of beer, 5 oz of wine, or 1 oz of hard liquor.  Work with your health care provider to maintain a healthy body weight or to lose weight. Ask what an ideal weight is for you.  Get at least 30 minutes of exercise that causes your heart to beat faster (aerobic exercise) most days of the week. Activities may include walking, swimming, or biking.  Work with your health care provider or diet and nutrition specialist (dietitian) to adjust your eating plan to your individual calorie needs. Reading food labels   Check food labels for the amount of sodium per serving. Choose foods with less than 5 percent of the Daily Value of sodium. Generally, foods with less than 300 mg of sodium per serving fit into this eating plan.  To find whole grains, look for  the word "whole" as the first word in the ingredient list. Shopping  Buy products labeled as "low-sodium" or "no salt added."  Buy fresh foods. Avoid canned foods and premade or frozen meals. Cooking  Avoid adding salt when cooking. Use salt-free seasonings or herbs instead of table salt or sea salt. Check with your health care provider or pharmacist before using salt substitutes.  Do not fry foods. Cook foods using healthy methods such as baking, boiling, grilling, and broiling instead.  Cook with heart-healthy oils, such as olive, canola, soybean, or sunflower oil. Meal planning  Eat a balanced diet that includes: ? 5 or more servings of fruits and vegetables each day. At each meal, try to fill half of your plate with fruits and vegetables. ? Up to 6-8 servings of whole grains each day. ? Less than 6 oz of lean meat, poultry, or fish each day. A 3-oz serving of meat is about the same size as a deck of cards. One egg equals 1 oz. ? 2 servings of low-fat dairy each day. ? A serving of nuts, seeds, or beans 5 times each week. ? Heart-healthy fats. Healthy fats called Omega-3 fatty acids are found in foods such as flaxseeds and coldwater fish, like sardines, salmon, and mackerel.  Limit how much you eat of the following: ? Canned or prepackaged foods. ? Food that is high in trans fat, such  as fried foods. ? Food that is high in saturated fat, such as fatty meat. ? Sweets, desserts, sugary drinks, and other foods with added sugar. ? Full-fat dairy products.  Do not salt foods before eating.  Try to eat at least 2 vegetarian meals each week.  Eat more home-cooked food and less restaurant, buffet, and fast food.  When eating at a restaurant, ask that your food be prepared with less salt or no salt, if possible. What foods are recommended? The items listed may not be a complete list. Talk with your dietitian about what dietary choices are best for you. Grains Whole-grain or  whole-wheat bread. Whole-grain or whole-wheat pasta. Brown rice. Modena Morrow. Bulgur. Whole-grain and low-sodium cereals. Pita bread. Low-fat, low-sodium crackers. Whole-wheat flour tortillas. Vegetables Fresh or frozen vegetables (raw, steamed, roasted, or grilled). Low-sodium or reduced-sodium tomato and vegetable juice. Low-sodium or reduced-sodium tomato sauce and tomato paste. Low-sodium or reduced-sodium canned vegetables. Fruits All fresh, dried, or frozen fruit. Canned fruit in natural juice (without added sugar). Meat and other protein foods Skinless chicken or [place]. Ground chicken or [place]. Pork with fat trimmed off. Fish and seafood. Egg whites. Dried beans, peas, or lentils. Unsalted nuts, nut butters, and seeds. Unsalted canned beans. Lean cuts of beef with fat trimmed off. Low-sodium, lean deli meat. Dairy Low-fat (1%) or fat-free (skim) milk. Fat-free, low-fat, or reduced-fat cheeses. Nonfat, low-sodium ricotta or cottage cheese. Low-fat or nonfat yogurt. Low-fat, low-sodium cheese. Fats and oils Soft margarine without trans fats. Vegetable oil. Low-fat, reduced-fat, or light mayonnaise and salad dressings (reduced-sodium). Canola, safflower, olive, soybean, and sunflower oils. Avocado. Seasoning and other foods Herbs. Spices. Seasoning mixes without salt. Unsalted popcorn and pretzels. Fat-free sweets. What foods are not recommended? The items listed may not be a complete list. Talk with your dietitian about what dietary choices are best for you. Grains Baked goods made with fat, such as croissants, muffins, or some breads. Dry pasta or rice meal packs. Vegetables Creamed or fried vegetables. Vegetables in a cheese sauce. Regular canned vegetables (not low-sodium or reduced-sodium). Regular canned tomato sauce and paste (not low-sodium or reduced-sodium). Regular tomato and vegetable juice (not low-sodium or reduced-sodium). [name]. Olives. Fruits Canned fruit in a light  or heavy syrup. Fried fruit. Fruit in cream or butter sauce. Meat and other protein foods Fatty cuts of meat. Ribs. Fried meat. [name]. Sausage. Bologna and other processed lunch meats. Salami. Fatback. Hotdogs. Bratwurst. Salted nuts and seeds. Canned beans with added salt. Canned or smoked fish. Whole eggs or egg yolks. Chicken or [place] with skin. Dairy Whole or 2% milk, cream, and half-and-half. Whole or full-fat cream cheese. Whole-fat or sweetened yogurt. Full-fat cheese. Nondairy creamers. Whipped toppings. Processed cheese and cheese spreads. Fats and oils Butter. Stick margarine. Lard. Shortening. Ghee. Bacon fat. Tropical oils, such as coconut, palm kernel, or palm oil. Seasoning and other foods Salted popcorn and pretzels. Onion salt, garlic salt, seasoned salt, table salt, and sea salt. Worcestershire sauce. Tartar sauce. Barbecue sauce. Teriyaki sauce. Soy sauce, including reduced-sodium. Steak sauce. Canned and packaged gravies. Fish sauce. Oyster sauce. Cocktail sauce. Horseradish that you find on the shelf. Ketchup. Mustard. Meat flavorings and tenderizers. Bouillon cubes. Hot sauce and Tabasco sauce. Premade or packaged marinades. Premade or packaged taco seasonings. Relishes. Regular salad dressings. Where to find more information:  National Heart, Lung, and [place]: https://wilson-eaton.com/  American Heart Association: www.heart.org Summary  The DASH eating plan is a healthy eating plan that has been shown  to reduce high blood pressure (hypertension). It may also reduce your risk for type 2 diabetes, heart disease, and stroke.  With the DASH eating plan, you should limit salt (sodium) intake to 2,300 mg a day. If you have hypertension, you may need to reduce your sodium intake to 1,500 mg a day.  When on the DASH eating plan, aim to eat more fresh fruits and vegetables, whole grains, lean proteins, low-fat dairy, and heart-healthy fats.  Work with your health care provider or  diet and nutrition specialist (dietitian) to adjust your eating plan to your individual calorie needs. This information is not intended to replace advice given to you by your health care provider. Make sure you discuss any questions you have with your health care provider. Document Released: 09/06/2011 Document Revised: 09/10/2016 Document Reviewed: 09/10/2016 Elsevier Interactive Patient Education  2019 [employer].
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
## 2019-05-22 ENCOUNTER — Ambulatory Visit: Payer: Medicare HMO | Admitting: Nurse Practitioner

## 2019-05-27 ENCOUNTER — Other Ambulatory Visit: Payer: Self-pay

## 2019-05-27 ENCOUNTER — Ambulatory Visit (INDEPENDENT_AMBULATORY_CARE_PROVIDER_SITE_OTHER): Payer: Medicare HMO | Admitting: Nurse Practitioner

## 2019-05-27 ENCOUNTER — Encounter: Payer: Self-pay | Admitting: Nurse Practitioner

## 2019-05-27 VITALS — BP 136/70 | HR 63 | Temp 98.2°F | Ht 73.0 in | Wt 165.0 lb

## 2019-05-27 DIAGNOSIS — E785 Hyperlipidemia, unspecified: Secondary | ICD-10-CM | POA: Diagnosis not present

## 2019-05-27 DIAGNOSIS — Z1211 Encounter for screening for malignant neoplasm of colon: Secondary | ICD-10-CM

## 2019-05-27 DIAGNOSIS — I10 Essential (primary) hypertension: Secondary | ICD-10-CM

## 2019-05-27 DIAGNOSIS — R69 Illness, unspecified: Secondary | ICD-10-CM | POA: Diagnosis not present

## 2019-05-27 DIAGNOSIS — Z1212 Encounter for screening for malignant neoplasm of rectum: Secondary | ICD-10-CM

## 2019-05-27 DIAGNOSIS — I5022 Chronic systolic (congestive) heart failure: Secondary | ICD-10-CM

## 2019-05-27 DIAGNOSIS — K5909 Other constipation: Secondary | ICD-10-CM

## 2019-05-27 DIAGNOSIS — Z23 Encounter for immunization: Secondary | ICD-10-CM

## 2019-05-27 DIAGNOSIS — G4700 Insomnia, unspecified: Secondary | ICD-10-CM | POA: Diagnosis not present

## 2019-05-27 DIAGNOSIS — F1020 Alcohol dependence, uncomplicated: Secondary | ICD-10-CM

## 2019-05-27 DIAGNOSIS — M5416 Radiculopathy, lumbar region: Secondary | ICD-10-CM

## 2019-05-27 DIAGNOSIS — N401 Enlarged prostate with lower urinary tract symptoms: Secondary | ICD-10-CM

## 2019-05-27 DIAGNOSIS — R3912 Poor urinary stream: Secondary | ICD-10-CM

## 2019-05-27 LAB — CBC WITH DIFFERENTIAL/PLATELET
Absolute Monocytes: 297 cells/uL (ref 200–950)
Basophils Absolute: 11 cells/uL (ref 0–200)
Basophils Relative: 0.4 %
Eosinophils Absolute: 218 cells/uL (ref 15–500)
Eosinophils Relative: 7.8 %
HCT: 38.6 % (ref 38.5–50.0)
Hemoglobin: 13.1 g/dL — ABNORMAL LOW (ref 13.2–17.1)
Lymphs Abs: 994 cells/uL (ref 850–3900)
MCH: 33.6 pg — ABNORMAL HIGH (ref 27.0–33.0)
MCHC: 33.9 g/dL (ref 32.0–36.0)
MCV: 99 fL (ref 80.0–100.0)
MPV: 10.4 fL (ref 7.5–12.5)
Monocytes Relative: 10.6 %
Neutro Abs: 1280 cells/uL — ABNORMAL LOW (ref 1500–7800)
Neutrophils Relative %: 45.7 %
Platelets: 229 10*3/uL (ref 140–400)
RBC: 3.9 10*6/uL — ABNORMAL LOW (ref 4.20–5.80)
RDW: 12.7 % (ref 11.0–15.0)
Total Lymphocyte: 35.5 %
WBC: 2.8 10*3/uL — ABNORMAL LOW (ref 3.8–10.8)

## 2019-05-27 LAB — COMPLETE METABOLIC PANEL WITH GFR
AG Ratio: 1.5 (calc) (ref 1.0–2.5)
ALT: 20 U/L (ref 9–46)
AST: 26 U/L (ref 10–35)
Albumin: 4.2 g/dL (ref 3.6–5.1)
Alkaline phosphatase (APISO): 40 U/L (ref 35–144)
BUN: 12 mg/dL (ref 7–25)
CO2: 30 mmol/L (ref 20–32)
Calcium: 9.3 mg/dL (ref 8.6–10.3)
Chloride: 108 mmol/L (ref 98–110)
Creat: 0.79 mg/dL (ref 0.70–1.25)
GFR, Est African American: 108 mL/min/{1.73_m2} (ref >=60)
GFR, Est Non African American: 93 mL/min/{1.73_m2} (ref >=60)
Globulin: 2.8 g/dL (calc) (ref 1.9–3.7)
Glucose, Bld: 93 mg/dL (ref 65–99)
Potassium: 4.3 mmol/L (ref 3.5–5.3)
Sodium: 142 mmol/L (ref 135–146)
Total Bilirubin: 0.4 mg/dL (ref 0.2–1.2)
Total Protein: 7 g/dL (ref 6.1–8.1)

## 2019-05-27 LAB — LIPID PANEL
Cholesterol: 240 mg/dL — ABNORMAL HIGH (ref <200)
HDL: 124 mg/dL (ref >=40)
LDL Cholesterol (Calc): 102 mg/dL (calc) — ABNORMAL HIGH
Non-HDL Cholesterol (Calc): 116 mg/dL (calc) (ref <130)
Total CHOL/HDL Ratio: 1.9 (calc) (ref <5.0)
Triglycerides: 54 mg/dL (ref <150)

## 2019-05-27 NOTE — Patient Instructions (Signed)
Encouraged to cut back on alcohol intake and would like you to stop  Referral made to gastroenterology you are overdue for colorectal screening  DASH Eating Plan DASH stands for "Dietary Approaches to Stop Hypertension." The DASH eating plan is a healthy eating plan that has been shown to reduce high blood pressure (hypertension). It may also reduce your risk for type 2 diabetes, heart disease, and stroke. The DASH eating plan may also help with weight loss. What are tips for following this plan?  General guidelines  Avoid eating more than 2,300 mg (milligrams) of salt (sodium) a day. If you have hypertension, you may need to reduce your sodium intake to 1,500 mg a day.  Limit alcohol intake to no more than 1 drink a day for nonpregnant women and 2 drinks a day for men. One drink equals 12 oz of beer, 5 oz of wine, or 1 oz of hard liquor.  Work with your health care provider to maintain a healthy body weight or to lose weight. Ask what an ideal weight is for you.  Get at least 30 minutes of exercise that causes your heart to beat faster (aerobic exercise) most days of the week. Activities may include walking, swimming, or biking.  Work with your health care provider or diet and nutrition specialist (dietitian) to adjust your eating plan to your individual calorie needs. Reading food labels   Check food labels for the amount of sodium per serving. Choose foods with less than 5 percent of the Daily Value of sodium. Generally, foods with less than 300 mg of sodium per serving fit into this eating plan.  To find whole grains, look for the word "whole" as the first word in the ingredient list. Shopping  Buy products labeled as "low-sodium" or "no salt added."  Buy fresh foods. Avoid canned foods and premade or frozen meals. Cooking  Avoid adding salt when cooking. Use salt-free seasonings or herbs instead of table salt or sea salt. Check with your health care provider or pharmacist before  using salt substitutes.  Do not fry foods. Cook foods using healthy methods such as baking, boiling, grilling, and broiling instead.  Cook with heart-healthy oils, such as olive, canola, soybean, or sunflower oil. Meal planning  Eat a balanced diet that includes: ? 5 or more servings of fruits and vegetables each day. At each meal, try to fill half of your plate with fruits and vegetables. ? Up to 6-8 servings of whole grains each day. ? Less than 6 oz of lean meat, poultry, or fish each day. A 3-oz serving of meat is about the same size as a deck of cards. One egg equals 1 oz. ? 2 servings of low-fat dairy each day. ? A serving of nuts, seeds, or beans 5 times each week. ? Heart-healthy fats. Healthy fats called Omega-3 fatty acids are found in foods such as flaxseeds and coldwater fish, like sardines, salmon, and mackerel.  Limit how much you eat of the following: ? Canned or prepackaged foods. ? Food that is high in trans fat, such as fried foods. ? Food that is high in saturated fat, such as fatty meat. ? Sweets, desserts, sugary drinks, and other foods with added sugar. ? Full-fat dairy products.  Do not salt foods before eating.  Try to eat at least 2 vegetarian meals each week.  Eat more home-cooked food and less restaurant, buffet, and fast food.  When eating at a restaurant, ask that your food be prepared with  less salt or no salt, if possible. What foods are recommended? The items listed may not be a complete list. Talk with your dietitian about what dietary choices are best for you. Grains Whole-grain or whole-wheat bread. Whole-grain or whole-wheat pasta. Brown rice. [place]row. Bulgur. Whole-grain and low-sodium cereals. Pita bread. Low-fat, low-sodium crackers. Whole-wheat flour tortillas. Vegetables Fresh or frozen vegetables (raw, steamed, roasted, or grilled). Low-sodium or reduced-sodium tomato and vegetable juice. Low-sodium or reduced-sodium tomato sauce and  tomato paste. Low-sodium or reduced-sodium canned vegetables. Fruits All fresh, dried, or frozen fruit. Canned fruit in natural juice (without added sugar). Meat and other protein foods Skinless chicken or [place]. Ground chicken or [place]. Pork with fat trimmed off. Fish and seafood. Egg whites. Dried beans, peas, or lentils. Unsalted nuts, nut butters, and seeds. Unsalted canned beans. Lean cuts of beef with fat trimmed off. Low-sodium, lean deli meat. Dairy Low-fat (1%) or fat-free (skim) milk. Fat-free, low-fat, or reduced-fat cheeses. Nonfat, low-sodium ricotta or cottage cheese. Low-fat or nonfat yogurt. Low-fat, low-sodium cheese. Fats and oils Soft margarine without trans fats. Vegetable oil. Low-fat, reduced-fat, or light mayonnaise and salad dressings (reduced-sodium). Canola, safflower, olive, soybean, and sunflower oils. Avocado. Seasoning and other foods Herbs. Spices. Seasoning mixes without salt. Unsalted popcorn and pretzels. Fat-free sweets. What foods are not recommended? The items listed may not be a complete list. Talk with your dietitian about what dietary choices are best for you. Grains Baked goods made with fat, such as croissants, muffins, or some breads. Dry pasta or rice meal packs. Vegetables Creamed or fried vegetables. Vegetables in a cheese sauce. Regular canned vegetables (not low-sodium or reduced-sodium). Regular canned tomato sauce and paste (not low-sodium or reduced-sodium). Regular tomato and vegetable juice (not low-sodium or reduced-sodium). [name]. Olives. Fruits Canned fruit in a light or heavy syrup. Fried fruit. Fruit in cream or butter sauce. Meat and other protein foods Fatty cuts of meat. Ribs. Fried meat. [name]. Sausage. Bologna and other processed lunch meats. Salami. Fatback. Hotdogs. Bratwurst. Salted nuts and seeds. Canned beans with added salt. Canned or smoked fish. Whole eggs or egg yolks. Chicken or [place] with skin. Dairy Whole or 2%  milk, cream, and half-and-half. Whole or full-fat cream cheese. Whole-fat or sweetened yogurt. Full-fat cheese. Nondairy creamers. Whipped toppings. Processed cheese and cheese spreads. Fats and oils Butter. Stick margarine. Lard. Shortening. Ghee. Bacon fat. Tropical oils, such as coconut, palm kernel, or palm oil. Seasoning and other foods Salted popcorn and pretzels. Onion salt, garlic salt, seasoned salt, table salt, and sea salt. Worcestershire sauce. Tartar sauce. Barbecue sauce. Teriyaki sauce. Soy sauce, including reduced-sodium. Steak sauce. Canned and packaged gravies. Fish sauce. Oyster sauce. Cocktail sauce. Horseradish that you find on the shelf. Ketchup. Mustard. Meat flavorings and tenderizers. Bouillon cubes. Hot sauce and Tabasco sauce. Premade or packaged marinades. Premade or packaged taco seasonings. Relishes. Regular salad dressings. Where to find more information:  National Heart, Lung, and [place]: https://wilson-eaton.com/  American Heart Association: www.heart.org Summary  The DASH eating plan is a healthy eating plan that has been shown to reduce high blood pressure (hypertension). It may also reduce your risk for type 2 diabetes, heart disease, and stroke.  With the DASH eating plan, you should limit salt (sodium) intake to 2,300 mg a day. If you have hypertension, you may need to reduce your sodium intake to 1,500 mg a day.  When on the DASH eating plan, aim to eat more fresh fruits and vegetables, whole grains, lean proteins,  low-fat dairy, and heart-healthy fats.  Work with your health care provider or diet and nutrition specialist (dietitian) to adjust your eating plan to your individual calorie needs. This information is not intended to replace advice given to you by your health care provider. Make sure you discuss any questions you have with your health care provider. Document Released: 09/06/2011 Document Revised: 08/30/2017 Document Reviewed: 09/10/2016  Elsevier Patient Education  2020 [employer].
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
## 2019-06-03 ENCOUNTER — Encounter: Payer: Self-pay | Admitting: Nurse Practitioner

## 2019-06-03 ENCOUNTER — Ambulatory Visit: Payer: Self-pay

## 2019-06-29 ENCOUNTER — Telehealth: Payer: Self-pay | Admitting: *Deleted

## 2019-06-29 NOTE — Telephone Encounter (Signed)
[name], Caregiver called and stated that patient is set up for a referral for a Colonoscopy. She stated that patient has done the Cologuard in the past. Stated that with patient's heart troubles it is recommended that patient has the Cologuard instead of the Colonoscopy when Dr. Eulas Post was here.  Please Advise.

## 2019-06-29 NOTE — Telephone Encounter (Signed)
Okay, he is not due for cologuard until 09/2019

## 2019-06-29 NOTE — Telephone Encounter (Signed)
Tried Actuary, NA

## 2019-06-30 NOTE — Telephone Encounter (Signed)
Patient caregiver notified and agreed. 

## 2019-07-04 ENCOUNTER — Other Ambulatory Visit: Payer: Self-pay | Admitting: Physician Assistant

## 2019-07-04 DIAGNOSIS — I5022 Chronic systolic (congestive) heart failure: Secondary | ICD-10-CM

## 2019-07-06 ENCOUNTER — Other Ambulatory Visit: Payer: Self-pay | Admitting: *Deleted

## 2019-07-06 MED ORDER — ROSUVASTATIN CALCIUM 5 MG PO TABS: 5.0000 mg | ORAL_TABLET | Freq: Every day | ORAL | 1 refills | Status: DC

## 2019-07-06 NOTE — Telephone Encounter (Signed)
[facility] [address] 

## 2019-07-07 ENCOUNTER — Encounter: Payer: Self-pay | Admitting: Nurse Practitioner

## 2019-08-04 ENCOUNTER — Other Ambulatory Visit: Payer: Self-pay

## 2019-08-04 ENCOUNTER — Encounter: Payer: Medicare HMO | Admitting: Nurse Practitioner

## 2019-08-04 NOTE — Progress Notes (Signed)
This encounter was created in error - please disregard.  This encounter was created in error - please disregard.

## 2019-09-04 ENCOUNTER — Telehealth: Payer: Self-pay | Admitting: *Deleted

## 2019-09-04 NOTE — Telephone Encounter (Signed)
Called the patient's aunt and told her I would be faxing the order requisition to [employer] for this patient's cologuard test.   Order faxed with attached insurance cards. Vivien Rota made aware via staff message.

## 2019-09-04 NOTE — Telephone Encounter (Signed)
-----   Message from Algernon Huxley, RN sent at 09/03/2019  4:26 PM EST -----  ----- Message ----- From: Wyatt Haste, RN Sent: 09/01/2019 To: Doristine Counter, RN  Patient needs to have his 4 year recheck on Cologard. Call and speak to patient's aunt.

## 2019-09-21 DIAGNOSIS — Z1211 Encounter for screening for malignant neoplasm of colon: Secondary | ICD-10-CM | POA: Diagnosis not present

## 2019-09-21 DIAGNOSIS — Z1212 Encounter for screening for malignant neoplasm of rectum: Secondary | ICD-10-CM | POA: Diagnosis not present

## 2019-09-28 ENCOUNTER — Ambulatory Visit: Payer: Medicare HMO | Admitting: Nurse Practitioner

## 2019-09-28 ENCOUNTER — Other Ambulatory Visit: Payer: Self-pay

## 2019-09-28 LAB — COLOGUARD: Cologuard: NEGATIVE

## 2019-10-27 ENCOUNTER — Telehealth: Payer: Self-pay | Admitting: Nurse Practitioner

## 2019-10-27 NOTE — Telephone Encounter (Signed)
Spoke to patient on the phone - patient does not want to reschedule the AWV that he missed in September & November.  States he doesn't see the need for this appt.

## 2019-11-03 ENCOUNTER — Encounter: Payer: Self-pay | Admitting: Nurse Practitioner

## 2019-11-06 ENCOUNTER — Encounter: Payer: Self-pay | Admitting: Nurse Practitioner

## 2019-11-06 ENCOUNTER — Other Ambulatory Visit: Payer: Self-pay

## 2019-11-06 ENCOUNTER — Ambulatory Visit (INDEPENDENT_AMBULATORY_CARE_PROVIDER_SITE_OTHER): Payer: Medicare HMO | Admitting: Nurse Practitioner

## 2019-11-06 VITALS — BP 138/96 | HR 68 | Temp 97.7°F | Resp 16 | Ht 73.0 in | Wt 170.0 lb

## 2019-11-06 DIAGNOSIS — F1020 Alcohol dependence, uncomplicated: Secondary | ICD-10-CM | POA: Diagnosis not present

## 2019-11-06 DIAGNOSIS — I10 Essential (primary) hypertension: Secondary | ICD-10-CM | POA: Diagnosis not present

## 2019-11-06 DIAGNOSIS — E785 Hyperlipidemia, unspecified: Secondary | ICD-10-CM | POA: Diagnosis not present

## 2019-11-06 DIAGNOSIS — R3912 Poor urinary stream: Secondary | ICD-10-CM | POA: Diagnosis not present

## 2019-11-06 DIAGNOSIS — R69 Illness, unspecified: Secondary | ICD-10-CM | POA: Diagnosis not present

## 2019-11-06 DIAGNOSIS — F325 Major depressive disorder, single episode, in full remission: Secondary | ICD-10-CM

## 2019-11-06 DIAGNOSIS — I5022 Chronic systolic (congestive) heart failure: Secondary | ICD-10-CM | POA: Diagnosis not present

## 2019-11-06 DIAGNOSIS — N401 Enlarged prostate with lower urinary tract symptoms: Secondary | ICD-10-CM | POA: Diagnosis not present
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
## 2019-11-06 NOTE — Patient Instructions (Signed)
Please make sure you are taking all your medication daily- can provide refills if needed to contact our office or the pharmacy  Recommended to cut back on drinking alcohol and to stop.   Follow up in 3 months and bring your girlfriend and medication with you.

## 2019-11-07 LAB — COMPLETE METABOLIC PANEL WITH GFR
AG Ratio: 1.4 (calc) (ref 1.0–2.5)
ALT: 30 U/L (ref 9–46)
AST: 31 U/L (ref 10–35)
Albumin: 4.2 g/dL (ref 3.6–5.1)
Alkaline phosphatase (APISO): 40 U/L (ref 35–144)
BUN: 13 mg/dL (ref 7–25)
CO2: 26 mmol/L (ref 20–32)
Calcium: 9.7 mg/dL (ref 8.6–10.3)
Chloride: 106 mmol/L (ref 98–110)
Creat: 0.79 mg/dL (ref 0.70–1.25)
GFR, Est African American: 107 mL/min/{1.73_m2} (ref >=60)
GFR, Est Non African American: 92 mL/min/{1.73_m2} (ref >=60)
Globulin: 2.9 g/dL (calc) (ref 1.9–3.7)
Glucose, Bld: 84 mg/dL (ref 65–99)
Potassium: 4 mmol/L (ref 3.5–5.3)
Sodium: 141 mmol/L (ref 135–146)
Total Bilirubin: 0.5 mg/dL (ref 0.2–1.2)
Total Protein: 7.1 g/dL (ref 6.1–8.1)

## 2019-11-07 LAB — CBC WITH DIFFERENTIAL/PLATELET
Absolute Monocytes: 367 cells/uL (ref 200–950)
Basophils Absolute: 20 cells/uL (ref 0–200)
Basophils Relative: 0.7 %
Eosinophils Absolute: 230 cells/uL (ref 15–500)
Eosinophils Relative: 8.2 %
HCT: 40.2 % (ref 38.5–50.0)
Hemoglobin: 13.7 g/dL (ref 13.2–17.1)
Lymphs Abs: 1330 cells/uL (ref 850–3900)
MCH: 33.6 pg — ABNORMAL HIGH (ref 27.0–33.0)
MCHC: 34.1 g/dL (ref 32.0–36.0)
MCV: 98.5 fL (ref 80.0–100.0)
MPV: 10.2 fL (ref 7.5–12.5)
Monocytes Relative: 13.1 %
Neutro Abs: 854 cells/uL — ABNORMAL LOW (ref 1500–7800)
Neutrophils Relative %: 30.5 %
Platelets: 225 10*3/uL (ref 140–400)
RBC: 4.08 10*6/uL — ABNORMAL LOW (ref 4.20–5.80)
RDW: 11.7 % (ref 11.0–15.0)
Total Lymphocyte: 47.5 %
WBC: 2.8 10*3/uL — ABNORMAL LOW (ref 3.8–10.8)

## 2019-11-07 LAB — LIPID PANEL
Cholesterol: 233 mg/dL — ABNORMAL HIGH (ref <200)
HDL: 126 mg/dL (ref >=40)
LDL Cholesterol (Calc): 96 mg/dL (calc)
Non-HDL Cholesterol (Calc): 107 mg/dL (calc) (ref <130)
Total CHOL/HDL Ratio: 1.8 (calc) (ref <5.0)
Triglycerides: 36 mg/dL (ref <150)

## 2019-11-09 ENCOUNTER — Other Ambulatory Visit: Payer: Self-pay | Admitting: Physician Assistant

## 2019-11-09 DIAGNOSIS — I5022 Chronic systolic (congestive) heart failure: Secondary | ICD-10-CM
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
## 2019-12-11 ENCOUNTER — Ambulatory Visit: Payer: Medicare HMO | Admitting: Internal Medicine

## 2019-12-11 ENCOUNTER — Encounter: Payer: Self-pay | Admitting: Internal Medicine

## 2019-12-11 ENCOUNTER — Other Ambulatory Visit: Payer: Self-pay

## 2019-12-11 DIAGNOSIS — I5022 Chronic systolic (congestive) heart failure: Secondary | ICD-10-CM

## 2019-12-11 MED ORDER — LISINOPRIL 20 MG PO TABS: 20.0000 mg | ORAL_TABLET | Freq: Every day | ORAL | 3 refills | Status: DC

## 2019-12-11 NOTE — Patient Instructions (Signed)
Medication Instructions:  No changes *If you need a refill on your cardiac medications before your next appointment, please call your pharmacy*   Lab Work: none If you have labs (blood work) drawn today and your tests are completely normal, you will receive your results only by: Marland Kitchen MyChart Message (if you have MyChart) OR . A paper copy in the mail If you have any lab test that is abnormal or we need to change your treatment, we will call you to review the results.   Testing/Procedures: none   Follow-Up: At [facility], you and your health needs are our priority.  As part of our continuing mission to provide you with exceptional heart care, we have created designated Provider Care Teams.  These Care Teams include your primary Cardiologist (physician) and Advanced Practice Providers (APPs -  Physician Assistants and Nurse Practitioners) who all work together to provide you with the care you need, when you need it.  We recommend signing up for the patient portal called "MyChart".  Sign up information is provided on this After Visit Summary.  MyChart is used to connect with patients for Virtual Visits (Telemedicine).  Patients are able to view lab/test results, encounter notes, upcoming appointments, etc.  Non-urgent messages can be sent to your provider as well.   To learn more about what you can do with MyChart, go to [email or web address].    Your next appointment:   4 month(s)  The format for your next appointment:   In Person  Provider:   You may see Dietrich Pates, MD or one of the following Advanced Practice Providers on your designated Care Team:    Tereso Newcomer, PA-C  [place]in [place], [place]  Berton Bon, NP    Other Instructions

## 2020-02-08 ENCOUNTER — Ambulatory Visit: Payer: Self-pay | Admitting: Nurse Practitioner

## 2020-02-26 ENCOUNTER — Telehealth: Payer: Self-pay | Admitting: *Deleted

## 2020-02-26 NOTE — Telephone Encounter (Signed)
Corrie Dandy, aunt called and stated that patient is having dental surgery next week and needs clearance.  I offered her an appointment to bring patient in Tuesday to get clearance and she stated that she would wait to schedule an appointment because she has also put a call into the Cardiologist to get this clearance also.  Stated she will call back and schedule if needed.

## 2020-08-09 ENCOUNTER — Encounter: Payer: Medicare HMO | Admitting: Nurse Practitioner

## 2020-08-09 ENCOUNTER — Telehealth: Payer: Self-pay

## 2020-08-09 ENCOUNTER — Other Ambulatory Visit: Payer: Self-pay

## 2020-08-09 NOTE — Progress Notes (Signed)
This encounter was created in error - please disregard.

## 2020-08-09 NOTE — Telephone Encounter (Signed)
Called patient to start AWV and patient wanted to reschedule to [facility] November 11th. Pa tient rescheduled until then.

## 2020-08-11 ENCOUNTER — Other Ambulatory Visit: Payer: Self-pay

## 2020-08-11 ENCOUNTER — Telehealth: Payer: Self-pay

## 2020-08-11 ENCOUNTER — Encounter: Payer: Medicare HMO | Admitting: Nurse Practitioner

## 2020-08-11 NOTE — Telephone Encounter (Addendum)
Tried calling patient to start AWV,but no answer. Voicemail box full.   1st attempt 1:17 pm 2nd attempt 1:39 pm 3rd and final attempt made at 1:51pm

## 2020-09-26 ENCOUNTER — Other Ambulatory Visit: Payer: Self-pay | Admitting: *Deleted

## 2020-09-26 ENCOUNTER — Ambulatory Visit: Payer: Self-pay | Admitting: Nurse Practitioner

## 2020-09-26 MED ORDER — CARVEDILOL 3.125 MG PO TABS: 3.1250 mg | ORAL_TABLET | Freq: Two times a day (BID) | ORAL | 0 refills | Status: DC

## 2020-09-26 NOTE — Telephone Encounter (Signed)
Received refill request from pharmacy

## 2020-09-28 ENCOUNTER — Ambulatory Visit (INDEPENDENT_AMBULATORY_CARE_PROVIDER_SITE_OTHER): Payer: Medicare HMO | Admitting: Family

## 2020-09-28 ENCOUNTER — Other Ambulatory Visit: Payer: Self-pay

## 2020-09-28 ENCOUNTER — Encounter: Payer: Self-pay | Admitting: Family

## 2020-09-28 VITALS — BP 142/90 | HR 69 | Temp 97.8°F | Resp 16 | Ht 73.0 in | Wt 166.4 lb

## 2020-09-28 DIAGNOSIS — I10 Essential (primary) hypertension: Secondary | ICD-10-CM

## 2020-09-28 DIAGNOSIS — R69 Illness, unspecified: Secondary | ICD-10-CM | POA: Diagnosis not present

## 2020-09-28 DIAGNOSIS — R3912 Poor urinary stream: Secondary | ICD-10-CM | POA: Diagnosis not present

## 2020-09-28 DIAGNOSIS — I5022 Chronic systolic (congestive) heart failure: Secondary | ICD-10-CM

## 2020-09-28 DIAGNOSIS — N401 Enlarged prostate with lower urinary tract symptoms: Secondary | ICD-10-CM

## 2020-09-28 DIAGNOSIS — G4700 Insomnia, unspecified: Secondary | ICD-10-CM

## 2020-09-28 DIAGNOSIS — E785 Hyperlipidemia, unspecified: Secondary | ICD-10-CM | POA: Diagnosis not present

## 2020-09-28 DIAGNOSIS — Z23 Encounter for immunization: Secondary | ICD-10-CM

## 2020-09-28 DIAGNOSIS — F325 Major depressive disorder, single episode, in full remission: Secondary | ICD-10-CM

## 2020-09-28 MED ORDER — CARVEDILOL 3.125 MG PO TABS: 3.1250 mg | ORAL_TABLET | Freq: Two times a day (BID) | ORAL | 1 refills | Status: DC

## 2020-09-28 MED ORDER — ROSUVASTATIN CALCIUM 5 MG PO TABS: 5.0000 mg | ORAL_TABLET | Freq: Every day | ORAL | 1 refills | Status: DC

## 2020-09-28 MED ORDER — LISINOPRIL 20 MG PO TABS: 20.0000 mg | ORAL_TABLET | Freq: Every day | ORAL | 1 refills | Status: DC

## 2020-09-28 MED ORDER — TRAZODONE HCL 100 MG PO TABS: 100.0000 mg | ORAL_TABLET | Freq: Every evening | ORAL | 3 refills | Status: DC | PRN

## 2020-09-28 NOTE — Patient Instructions (Signed)
-   increase water intake to 6-8 glasses daily
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
## 2020-09-29 LAB — COMPLETE METABOLIC PANEL WITH GFR
AG Ratio: 1.6 (calc) (ref 1.0–2.5)
ALT: 14 U/L (ref 9–46)
AST: 22 U/L (ref 10–35)
Albumin: 4.2 g/dL (ref 3.6–5.1)
Alkaline phosphatase (APISO): 45 U/L (ref 35–144)
BUN: 16 mg/dL (ref 7–25)
CO2: 30 mmol/L (ref 20–32)
Calcium: 9.5 mg/dL (ref 8.6–10.3)
Chloride: 106 mmol/L (ref 98–110)
Creat: 0.84 mg/dL (ref 0.70–1.25)
GFR, Est African American: 104 mL/min/{1.73_m2} (ref >=60)
GFR, Est Non African American: 90 mL/min/{1.73_m2} (ref >=60)
Globulin: 2.7 g/dL (calc) (ref 1.9–3.7)
Glucose, Bld: 92 mg/dL (ref 65–99)
Potassium: 4.4 mmol/L (ref 3.5–5.3)
Sodium: 140 mmol/L (ref 135–146)
Total Bilirubin: 0.5 mg/dL (ref 0.2–1.2)
Total Protein: 6.9 g/dL (ref 6.1–8.1)

## 2020-09-29 LAB — LIPID PANEL
Cholesterol: 224 mg/dL — ABNORMAL HIGH (ref <200)
HDL: 91 mg/dL (ref >=40)
LDL Cholesterol (Calc): 121 mg/dL (calc) — ABNORMAL HIGH
Non-HDL Cholesterol (Calc): 133 mg/dL (calc) — ABNORMAL HIGH (ref <130)
Total CHOL/HDL Ratio: 2.5 (calc) (ref <5.0)
Triglycerides: 41 mg/dL (ref <150)

## 2020-09-29 LAB — CBC WITH DIFFERENTIAL/PLATELET
Absolute Monocytes: 289 cells/uL (ref 200–950)
Basophils Absolute: 19 cells/uL (ref 0–200)
Basophils Relative: 0.7 %
Eosinophils Absolute: 240 cells/uL (ref 15–500)
Eosinophils Relative: 8.9 %
HCT: 40.8 % (ref 38.5–50.0)
Hemoglobin: 14 g/dL (ref 13.2–17.1)
Lymphs Abs: 1231 cells/uL (ref 850–3900)
MCH: 34 pg — ABNORMAL HIGH (ref 27.0–33.0)
MCHC: 34.3 g/dL (ref 32.0–36.0)
MCV: 99 fL (ref 80.0–100.0)
MPV: 10.6 fL (ref 7.5–12.5)
Monocytes Relative: 10.7 %
Neutro Abs: 921 cells/uL — ABNORMAL LOW (ref 1500–7800)
Neutrophils Relative %: 34.1 %
Platelets: 233 10*3/uL (ref 140–400)
RBC: 4.12 10*6/uL — ABNORMAL LOW (ref 4.20–5.80)
RDW: 11.7 % (ref 11.0–15.0)
Total Lymphocyte: 45.6 %
WBC: 2.7 10*3/uL — ABNORMAL LOW (ref 3.8–10.8)

## 2020-09-29 LAB — TSH: TSH: 1.19 mIU/L (ref 0.40–4.50)

## 2020-10-03 ENCOUNTER — Other Ambulatory Visit: Payer: Self-pay

## 2020-10-03 DIAGNOSIS — I10 Essential (primary) hypertension: Secondary | ICD-10-CM

## 2020-10-03 DIAGNOSIS — E785 Hyperlipidemia, unspecified: Secondary | ICD-10-CM

## 2020-10-28 ENCOUNTER — Emergency Department (HOSPITAL_COMMUNITY)
Admission: EM | Admit: 2020-10-28 | Discharge: 2020-10-28 | Disposition: A | Discharge status: Home / Self Care | Payer: Medicare HMO | Attending: Emergency Medicine | Admitting: Emergency Medicine

## 2020-10-28 ENCOUNTER — Encounter (HOSPITAL_COMMUNITY): Payer: Self-pay | Admitting: Emergency Medicine

## 2020-10-28 ENCOUNTER — Other Ambulatory Visit: Payer: Self-pay

## 2020-10-28 DIAGNOSIS — Z79899 Other long term (current) drug therapy: Secondary | ICD-10-CM | POA: Insufficient documentation

## 2020-10-28 DIAGNOSIS — I5022 Chronic systolic (congestive) heart failure: Secondary | ICD-10-CM | POA: Insufficient documentation

## 2020-10-28 DIAGNOSIS — Z7982 Long term (current) use of aspirin: Secondary | ICD-10-CM | POA: Diagnosis not present

## 2020-10-28 DIAGNOSIS — S0592XA Unspecified injury of left eye and orbit, initial encounter: Secondary | ICD-10-CM | POA: Diagnosis present

## 2020-10-28 DIAGNOSIS — I110 Hypertensive heart disease with heart failure: Secondary | ICD-10-CM | POA: Insufficient documentation

## 2020-10-28 DIAGNOSIS — Z955 Presence of coronary angioplasty implant and graft: Secondary | ICD-10-CM | POA: Diagnosis not present

## 2020-10-28 DIAGNOSIS — H209 Unspecified iridocyclitis: Secondary | ICD-10-CM

## 2020-10-28 DIAGNOSIS — W500XXA Accidental hit or strike by another person, initial encounter: Secondary | ICD-10-CM | POA: Diagnosis not present

## 2020-10-28 DIAGNOSIS — Z87891 Personal history of nicotine dependence: Secondary | ICD-10-CM | POA: Diagnosis not present

## 2020-10-28 MED ORDER — CYCLOPENTOLATE HCL 1 % OP SOLN: 1.0000 [drp] | Freq: Two times a day (BID) | OPHTHALMIC | 0 refills | Status: AC

## 2020-10-28 MED ORDER — FLUORESCEIN SODIUM 1 MG OP STRP
1.0000 | ORAL_STRIP | Freq: Once | OPHTHALMIC | Status: AC
  Administered 2020-10-28: 1 via OPHTHALMIC
  Filled 2020-10-28: qty 1

## 2020-10-28 MED ORDER — SULFACETAMIDE-PREDNISOLONE 10-0.23 % OP SOLN: 1.0000 [drp] | Freq: Four times a day (QID) | OPHTHALMIC | 0 refills | Status: DC

## 2020-10-28 MED ORDER — TETRACAINE HCL 0.5 % OP SOLN
2.0000 [drp] | Freq: Once | OPHTHALMIC | Status: AC
  Administered 2020-10-28: 2 [drp] via OPHTHALMIC
  Filled 2020-10-28: qty 4

## 2020-10-28 NOTE — Discharge Instructions (Addendum)
You have been seen and discharged from the emergency department.  Call to schedule an appointment to follow-up with the ophthalmologist in 1 week.  Use eyedrops as directed.  Follow-up with your primary provider for reevaluation. Take home medications as prescribed. If you have any worsening symptoms, severe eye pain, vision loss or further concerns for health please return to an emergency department for further evaluation.
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
## 2020-12-05 ENCOUNTER — Telehealth: Payer: Self-pay | Admitting: Nurse Practitioner

## 2020-12-05 NOTE — Telephone Encounter (Signed)
I called the patient to schedule an AWV and was unable to reach the patient or leave a vm-TM

## 2021-01-31 ENCOUNTER — Ambulatory Visit
Admission: RE | Admit: 2021-01-31 | Discharge: 2021-01-31 | Disposition: A | Discharge status: Home / Self Care | Payer: Medicare HMO | Source: Ambulatory Visit | Attending: Nurse Practitioner | Admitting: Nurse Practitioner

## 2021-01-31 ENCOUNTER — Other Ambulatory Visit: Payer: Self-pay

## 2021-01-31 ENCOUNTER — Encounter: Payer: Self-pay | Admitting: Nurse Practitioner

## 2021-01-31 ENCOUNTER — Ambulatory Visit (INDEPENDENT_AMBULATORY_CARE_PROVIDER_SITE_OTHER): Payer: Medicare HMO | Admitting: Nurse Practitioner

## 2021-01-31 VITALS — BP 122/90 | HR 57 | Temp 97.0°F | Ht 73.0 in | Wt 165.8 lb

## 2021-01-31 DIAGNOSIS — R69 Illness, unspecified: Secondary | ICD-10-CM | POA: Diagnosis not present

## 2021-01-31 DIAGNOSIS — F325 Major depressive disorder, single episode, in full remission: Secondary | ICD-10-CM

## 2021-01-31 DIAGNOSIS — M545 Low back pain, unspecified: Secondary | ICD-10-CM | POA: Diagnosis not present

## 2021-01-31 DIAGNOSIS — F1020 Alcohol dependence, uncomplicated: Secondary | ICD-10-CM | POA: Diagnosis not present

## 2021-01-31 DIAGNOSIS — I5022 Chronic systolic (congestive) heart failure: Secondary | ICD-10-CM

## 2021-01-31 DIAGNOSIS — M5442 Lumbago with sciatica, left side: Secondary | ICD-10-CM | POA: Diagnosis not present

## 2021-01-31 MED ORDER — PREDNISONE 10 MG (21) PO TBPK: ORAL_TABLET | ORAL | 0 refills | Status: DC
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
## 2021-01-31 NOTE — Patient Instructions (Signed)
Prednisone dose pack Use heating pad 3 times daily to back

## 2021-02-07 DIAGNOSIS — Z125 Encounter for screening for malignant neoplasm of prostate: Secondary | ICD-10-CM | POA: Diagnosis not present

## 2021-02-07 DIAGNOSIS — R350 Frequency of micturition: Secondary | ICD-10-CM | POA: Diagnosis not present

## 2021-02-07 DIAGNOSIS — R310 Gross hematuria: Secondary | ICD-10-CM | POA: Diagnosis not present

## 2021-02-07 DIAGNOSIS — R351 Nocturia: Secondary | ICD-10-CM | POA: Diagnosis not present

## 2021-02-07 DIAGNOSIS — R3912 Poor urinary stream: Secondary | ICD-10-CM | POA: Diagnosis not present

## 2021-02-07 DIAGNOSIS — N401 Enlarged prostate with lower urinary tract symptoms: Secondary | ICD-10-CM | POA: Diagnosis not present

## 2021-02-21 ENCOUNTER — Telehealth: Payer: Medicare HMO | Admitting: Nurse Practitioner

## 2021-02-21 DIAGNOSIS — N3289 Other specified disorders of bladder: Secondary | ICD-10-CM | POA: Diagnosis not present

## 2021-02-21 DIAGNOSIS — K7689 Other specified diseases of liver: Secondary | ICD-10-CM | POA: Diagnosis not present

## 2021-02-21 DIAGNOSIS — R310 Gross hematuria: Secondary | ICD-10-CM | POA: Diagnosis not present

## 2021-02-21 NOTE — Telephone Encounter (Signed)
Patient notified and letter left up front for pick up

## 2021-02-21 NOTE — Telephone Encounter (Signed)
Signed and given to [employer]

## 2021-02-21 NOTE — Telephone Encounter (Signed)
Pt needs a letter for his work by Friday 02/24/21 stating how much he is able to lift on his job.  [name] states that he doesn't lift over 25 lbs at any given time of the day.  Thanks, Rosezella [place]

## 2021-02-21 NOTE — Telephone Encounter (Signed)
With his back pain he ideally should not be lifting anything over 10 lbs due to his Degenerative disc disease of lumbar spine.  Since I wont be back in the office until next week Synetta Fail can you format a letter and have Amy sign it since she will be there- if she is okay with doing so.

## 2021-02-21 NOTE — Telephone Encounter (Signed)
Done, I also attempted to print. Thank you.

## 2021-03-03 DIAGNOSIS — R350 Frequency of micturition: Secondary | ICD-10-CM | POA: Diagnosis not present

## 2021-03-03 DIAGNOSIS — R3912 Poor urinary stream: Secondary | ICD-10-CM | POA: Diagnosis not present

## 2021-03-03 DIAGNOSIS — N401 Enlarged prostate with lower urinary tract symptoms: Secondary | ICD-10-CM | POA: Diagnosis not present

## 2021-03-03 DIAGNOSIS — R310 Gross hematuria: Secondary | ICD-10-CM | POA: Diagnosis not present

## 2021-03-16 ENCOUNTER — Other Ambulatory Visit: Payer: Self-pay | Admitting: Urology

## 2021-03-21 ENCOUNTER — Encounter: Payer: Self-pay | Admitting: Family Medicine

## 2021-03-21 ENCOUNTER — Ambulatory Visit: Payer: Medicare HMO | Admitting: Family

## 2021-03-21 ENCOUNTER — Ambulatory Visit
Admission: RE | Admit: 2021-03-21 | Discharge: 2021-03-21 | Disposition: A | Discharge status: Home / Self Care | Payer: Medicare HMO | Source: Ambulatory Visit | Attending: Family Medicine | Admitting: Family Medicine

## 2021-03-21 ENCOUNTER — Other Ambulatory Visit: Payer: Self-pay

## 2021-03-21 ENCOUNTER — Ambulatory Visit (INDEPENDENT_AMBULATORY_CARE_PROVIDER_SITE_OTHER): Payer: Medicare HMO | Admitting: Family Medicine

## 2021-03-21 VITALS — BP 142/68 | HR 63 | Temp 97.3°F | Ht 73.0 in | Wt 160.4 lb

## 2021-03-21 DIAGNOSIS — R69 Illness, unspecified: Secondary | ICD-10-CM | POA: Diagnosis not present

## 2021-03-21 DIAGNOSIS — F1010 Alcohol abuse, uncomplicated: Secondary | ICD-10-CM | POA: Diagnosis not present

## 2021-03-21 DIAGNOSIS — M25532 Pain in left wrist: Secondary | ICD-10-CM | POA: Diagnosis not present

## 2021-03-21 DIAGNOSIS — Z043 Encounter for examination and observation following other accident: Secondary | ICD-10-CM | POA: Diagnosis not present
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
## 2021-03-23 ENCOUNTER — Telehealth: Payer: Self-pay | Admitting: Nurse Practitioner

## 2021-03-23 NOTE — Telephone Encounter (Signed)
Pt called to see if his xray results had come back yet  Would someone call him with those results  Thanks Rosezella [place]

## 2021-03-23 NOTE — Telephone Encounter (Signed)
Pt saw Dr Hyacinth Meeker who ordered xrays

## 2021-03-23 NOTE — Telephone Encounter (Signed)
Refer to xray report, Dr.Miller has addressed.

## 2021-03-23 NOTE — Telephone Encounter (Signed)
Patient is calling requesting his results of his x-rays he had done.   Dr. Hyacinth Meeker is not in office and will not be back until Tuesday,  would someone please review them and let me know what to advise patient.

## 2021-03-29 ENCOUNTER — Other Ambulatory Visit: Payer: Self-pay | Admitting: *Deleted

## 2021-03-29 ENCOUNTER — Other Ambulatory Visit: Payer: Self-pay

## 2021-03-29 NOTE — Patient Outreach (Signed)
[facility]) Care Management  03/29/2021  [name] [birth year] [id number]  Unsuccessful outreach attempt made to patient. Patient answered the phone and stated that he would not be able to speak today. He did  explain that he would give his wife this number and have her call back to receive the information regarding [facility] services.   Plan: RN Health Coach will wait for the patient's wife to call back, will call patient within the next several business days if this nurse does not hear back from the patient's wife, and will send patient an unsuccessful letter.   [name] RN, BSN [facility] Care Management  RN Health Coach [phone] [email or web address]

## 2021-03-30 ENCOUNTER — Other Ambulatory Visit: Payer: Medicare HMO | Admitting: *Deleted

## 2021-03-30 NOTE — Patient Outreach (Signed)
[facility]) Care Management  03/30/2021  [name] October 15, 1950 [id number]  Successful telephone outreach call to patient's Aunt [name]. HIPAA identifiers obtained. Nurse called back the patient's Aunt as requested by the VM she left this nurse. Aunt explained that she often assists the patient with his healthcare needs due to patient having cognitive difficulties. Aunt stated that the patient contacted her to call this nurse to clarify why she call the patient yesterday. Nurse reviewed with [facility] program and services. [name] stated that she would call the patient and explain [facility] benefit and program. She added that she would have the patient call this nurse back if he does want to participate.    Plan: RN Health Coach will call patient within the next several business days to inquire if he would like to participate with [facility] program if she does not hear back from the patient. Nurse previously sent patient a [facility] pamphlet.   [name] RN, BSN [facility] Care Management  RN Health Coach [phone] [name]

## 2021-03-31 ENCOUNTER — Ambulatory Visit: Payer: Medicare HMO | Admitting: Nurse Practitioner

## 2021-04-10 NOTE — Progress Notes (Addendum)
COVID Vaccine Completed: Yes x3 Date COVID Vaccine completed: 12/12/19, 01/08/20 Has received booster: 10/29/20 COVID vaccine manufacturer: Pfizer  Date of COVID positive in last 90 days: N/A  PCP - Abbey Chatters, NP Cardiologist - Dietrich Pates, MD  Chest x-ray -  EKG - 04/11/21 Epic Stress Test - N/A  ECHO - 12/12/15 Epic Cardiac Cath - 01/23/16 Epic Pacemaker/ICD device last checked: N/A Spinal Cord Stimulator: N/A  Sleep Study - N/A CPAP -   Fasting Blood Sugar - N/A Checks Blood Sugar _____ times a day  Blood Thinner Instructions: Aspirin Instructions: ASA 81mg  Last Dose:  Activity level: Can go up a flight of stairs and perform activities of daily living without stopping and without symptoms of chest pain or shortness of breath.     Anesthesia review: HTN, CHF, EF 35-40%  Patient denies shortness of breath, fever, cough and chest pain at PAT appointment   Patient verbalized understanding of instructions that were given to them at the PAT appointment. Patient was also instructed that they will need to review over the PAT instructions again at home before surgery.

## 2021-04-10 NOTE — Patient Instructions (Addendum)
DUE TO COVID-19 ONLY ONE VISITOR IS ALLOWED TO COME WITH YOU AND STAY IN THE WAITING ROOM ONLY DURING PRE OP AND PROCEDURE.   **NO VISITORS ARE ALLOWED IN THE SHORT STAY AREA OR RECOVERY ROOM!!**  IF YOU WILL BE ADMITTED INTO THE HOSPITAL YOU ARE ALLOWED ONLY TWO SUPPORT PEOPLE DURING VISITATION HOURS ONLY (10AM -8PM)   The support person(s) may change daily. The support person(s) must pass our screening, gel in and out, and wear a mask at all times, including in the patient's room. Patients must also wear a mask when staff or their support person are in the room.  No visitors under the age of 25. Any visitor under the age of 52 must be accompanied by an adult.    COVID SWAB TESTING MUST BE COMPLETED ON:  04/14/21 @ 10:00 AM   [address] [place], [place] [address]   You are not required to quarantine, however you are required to wear a well-fitted mask when you are out and around people not in your household.  Hand Hygiene often Do NOT share personal items Notify your provider if you are in close contact with someone who has COVID or you develop fever 100.4 or greater, new onset of sneezing, cough, sore throat, shortness of breath or body aches.   Your procedure is scheduled on: 04/18/21   Report to [facility] Main  Entrance    Report to admitting at 10:00 AM   Call this number if you have problems the morning of surgery [phone]   Do not eat food :After Midnight.   May have liquids until 9:00 AM day of surgery  CLEAR LIQUID DIET  Foods Allowed                                                                     Foods Excluded  Water, Black Coffee and tea, regular and decaf               liquids that you cannot  Plain Jell-O in any flavor  (No red)                                     see through such as: Fruit ices (not with fruit pulp)                                            milk, soups, orange juice              Iced Popsicles (No red)                                                 All solid food                                   Apple juices Sports drinks like Gatorade (No red) Lightly  seasoned clear broth or consume(fat free) Sugar, honey syrup     Oral Hygiene is also important to reduce your risk of infection.                                      Take these medicines the morning of surgery with A SIP OF WATER: Carvedilol, Rosuvastatin.                               You may not have any metal on your body including jewelry, and body piercing             Do not wear lotions, powders, cologne, or deodorant              Men may shave face and neck.   Do not bring valuables to the hospital. [facility] IS NOT             RESPONSIBLE   FOR VALUABLES.   Contacts, dentures or bridgework may not be worn into surgery.   Bring small overnight bag day of surgery.   Special Instructions: Bring a copy of your healthcare power of attorney and living will documents         the day of surgery if you haven't scanned them in before.   Please read over the following fact sheets you were given: IF YOU HAVE QUESTIONS ABOUT YOUR PRE OP INSTRUCTIONS PLEASE CALL [phone]   [facility] - Preparing for Surgery Before surgery, you can play an important role.  Because skin is not sterile, your skin needs to be as free of germs as possible.  You can reduce the number of germs on your skin by washing with CHG (chlorahexidine gluconate) soap before surgery.  CHG is an antiseptic cleaner which kills germs and bonds with the skin to continue killing germs even after washing. Please DO NOT use if you have an allergy to CHG or antibacterial soaps.  If your skin becomes reddened/irritated stop using the CHG and inform your nurse when you arrive at Short Stay. Do not shave (including legs and underarms) for at least 48 hours prior to the first CHG shower.  You may shave your face/neck.  Please follow these instructions carefully:  1.  Shower with CHG Soap the night  before surgery and the  morning of surgery.  2.  If you choose to wash your hair, wash your hair first as usual with your normal  shampoo.  3.  After you shampoo, rinse your hair and body thoroughly to remove the shampoo.                             4.  Use CHG as you would any other liquid soap.  You can apply chg directly to the skin and wash.  Gently with a scrungie or clean washcloth.  5.  Apply the CHG Soap to your body ONLY FROM THE NECK DOWN.   Do   not use on face/ open                           Wound or open sores. Avoid contact with eyes, ears mouth and   genitals (private parts).  Wash face,  Genitals (private parts) with your normal soap.             6.  Wash thoroughly, paying special attention to the area where your    surgery  will be performed.  7.  Thoroughly rinse your body with warm water from the neck down.  8.  DO NOT shower/wash with your normal soap after using and rinsing off the CHG Soap.                9.  Pat yourself dry with a clean towel.            10.  Wear clean pajamas.            11.  Place clean sheets on your bed the night of your first shower and do not  sleep with pets. Day of Surgery : Do not apply any lotions/deodorants the morning of surgery.  Please wear clean clothes to the hospital/surgery center.  FAILURE TO FOLLOW THESE INSTRUCTIONS MAY RESULT IN THE CANCELLATION OF YOUR SURGERY  PATIENT SIGNATURE_________________________________  NURSE SIGNATURE__________________________________  ________________________________________________________________________

## 2021-04-11 ENCOUNTER — Encounter (HOSPITAL_COMMUNITY): Payer: Self-pay

## 2021-04-11 ENCOUNTER — Other Ambulatory Visit: Payer: Self-pay

## 2021-04-11 ENCOUNTER — Other Ambulatory Visit: Payer: Self-pay | Admitting: *Deleted

## 2021-04-11 ENCOUNTER — Encounter (HOSPITAL_COMMUNITY)
Admission: RE | Admit: 2021-04-11 | Discharge: 2021-04-11 | Disposition: A | Discharge status: Home / Self Care | Payer: Medicare HMO | Source: Ambulatory Visit | Attending: Urology | Admitting: Urology

## 2021-04-11 DIAGNOSIS — I110 Hypertensive heart disease with heart failure: Secondary | ICD-10-CM | POA: Insufficient documentation

## 2021-04-11 DIAGNOSIS — Z7901 Long term (current) use of anticoagulants: Secondary | ICD-10-CM | POA: Diagnosis not present

## 2021-04-11 DIAGNOSIS — N138 Other obstructive and reflux uropathy: Secondary | ICD-10-CM | POA: Insufficient documentation

## 2021-04-11 DIAGNOSIS — I5022 Chronic systolic (congestive) heart failure: Secondary | ICD-10-CM | POA: Diagnosis not present

## 2021-04-11 DIAGNOSIS — Z79899 Other long term (current) drug therapy: Secondary | ICD-10-CM | POA: Insufficient documentation

## 2021-04-11 DIAGNOSIS — Z87891 Personal history of nicotine dependence: Secondary | ICD-10-CM | POA: Insufficient documentation

## 2021-04-11 DIAGNOSIS — Z01818 Encounter for other preprocedural examination: Secondary | ICD-10-CM | POA: Insufficient documentation

## 2021-04-11 DIAGNOSIS — N401 Enlarged prostate with lower urinary tract symptoms: Secondary | ICD-10-CM | POA: Insufficient documentation

## 2021-04-11 DIAGNOSIS — Z7982 Long term (current) use of aspirin: Secondary | ICD-10-CM | POA: Diagnosis not present

## 2021-04-11 LAB — CBC
HCT: 37.5 % — ABNORMAL LOW (ref 39.0–52.0)
Hemoglobin: 12.7 g/dL — ABNORMAL LOW (ref 13.0–17.0)
MCH: 33.1 pg (ref 26.0–34.0)
MCHC: 33.9 g/dL (ref 30.0–36.0)
MCV: 97.7 fL (ref 80.0–100.0)
Platelets: 193 10*3/uL (ref 150–400)
RBC: 3.84 MIL/uL — ABNORMAL LOW (ref 4.22–5.81)
RDW: 11.9 % (ref 11.5–15.5)
WBC: 2.8 10*3/uL — ABNORMAL LOW (ref 4.0–10.5)
nRBC: 0 % (ref 0.0–0.2)

## 2021-04-11 LAB — BASIC METABOLIC PANEL
Anion gap: 4 — ABNORMAL LOW (ref 5–15)
BUN: 17 mg/dL (ref 8–23)
CO2: 29 mmol/L (ref 22–32)
Calcium: 9.6 mg/dL (ref 8.9–10.3)
Chloride: 106 mmol/L (ref 98–111)
Creatinine, Ser: 0.75 mg/dL (ref 0.61–1.24)
GFR, Estimated: >60 mL/min (ref >60)
Glucose, Bld: 100 mg/dL — ABNORMAL HIGH (ref 70–99)
Potassium: 4.7 mmol/L (ref 3.5–5.1)
Sodium: 139 mmol/L (ref 135–145)

## 2021-04-11 NOTE — Patient Outreach (Signed)
[facility]) Care Management  04/11/2021  [name] 1951/02/16 [id number]  Successful telephone outreach call to patient's Aunt [name]. HIPAA identifiers obtained. Nurse called to explain that the [facility] information she sent to the patient's address was sent back. Nurse discussed that there is now a new address for the patient. Corrie Dandy stated that the new address was her's and asked that the [facility] brochure be sent to the new address. Corrie Dandy does want to review the brochure with the patient and then make a decision regarding participation with the [facility] program. Aunt discussed that the patient does need transportation to a [place] facility to get a covid test before his upcoming surgery on 04/18/21. Nurse gave the Aunt the [facility] transportation number to call to arrange a ride for the patient. [name] a [facility] contacted this nurse to validate that this [facility] CM authorized the initiation of [facility] [employer] for the patient. Authorization was given and the ride will be arranged for the patient.  Aunt gave this nurse permission to call her back at a later date to inquire if the patient is interested in participating with the [facility] program.  Plan: RN Health Coach will call patient/Aunt [name] within the next several weeks.  [name] RN, BSN [facility] Care Management  RN Health Coach [phone] [email or web address]

## 2021-04-12 ENCOUNTER — Telehealth: Payer: Self-pay | Admitting: Internal Medicine

## 2021-04-12 ENCOUNTER — Telehealth: Payer: Self-pay | Admitting: Nurse Practitioner

## 2021-04-12 NOTE — Telephone Encounter (Signed)
   [name] DOB: 1950/12/07 MRN: [id number]   RIDER WAIVER AND RELEASE OF LIABILITY  For purposes of improving physical access to our facilities, [facility] is pleased to partner with third parties to provide [facility] patients or other authorized individuals the option of convenient, on-demand ground transportation services (the Chiropractor") through use of the technology service that enables users to request on-demand ground transportation from independent third-party providers.  By opting to use and accept these [employer], I, the undersigned, hereby agree on behalf of myself, and on behalf of any minor child using the Science writer for whom I am the parent or legal guardian, as follows:  Science writer provided to me are provided by independent third-party transportation providers who are not [employer] or employees and who are unaffiliated with [employer]. [place] is neither a transportation carrier nor a common or public carrier. [facility] has no control over the quality or safety of the transportation that occurs as a result of the [employer]. [facility] cannot guarantee that any third-party transportation provider will complete any arranged transportation service. [facility] makes no representation, warranty, or guarantee regarding the reliability, timeliness, quality, safety, suitability, or availability of any of the Transport Services or that they will be error free. I fully understand that traveling by vehicle involves risks and dangers of serious bodily injury, including permanent disability, paralysis, and death. I agree, on behalf of myself and on behalf of any minor child using the Transport Services for whom I am the parent or legal guardian, that the entire risk arising out of my use of the [employer] remains solely with me, to the maximum extent permitted under applicable law. The [employer] are provided "as  is" and "as available." [facility] disclaims all representations and warranties, express, implied or statutory, not expressly set out in these terms, including the implied warranties of merchantability and fitness for a particular purpose. I hereby waive and release [facility], its agents, employees, officers, directors, representatives, insurers, attorneys, assigns, successors, subsidiaries, and affiliates from any and all past, present, or future claims, demands, liabilities, actions, causes of action, or suits of any kind directly or indirectly arising from acceptance and use of the [employer]. I further waive and release [facility] and its affiliates from all present and future liability and responsibility for any injury or death to persons or damages to property caused by or related to the use of the [employer]. I have read this Waiver and Release of Liability, and I understand the terms used in it and their legal significance. This Waiver is freely and voluntarily given with the understanding that my right (as well as the right of any minor child for whom I am the parent or legal guardian using the [employer]) to legal recourse against  in connection with the [employer] is knowingly surrendered in return for use of these services.   I attest that I read the consent document to [name], gave [name] the opportunity to ask questions and answered the questions asked (if any). I affirm that [name] then provided consent for he's participation in this program.     [name]

## 2021-04-12 NOTE — Telephone Encounter (Signed)
Pt has been scheduled to see Gillian Shields, NP, 04/28/2021 and surgical clearance will be addressed at that time.  Will route back to the requesting surgeon's office to make them aware.

## 2021-04-12 NOTE — Telephone Encounter (Signed)
   [facility] Pre-operative Risk Assessment    Patient Name: [name]  DOB: 08-28-1951 MRN: [id number]  HEARTCARE STAFF:  - IMPORTANT!!!!!! Under Visit Info/Reason for Call, type in Other and utilize the format Clearance MM/DD/YY or Clearance TBD. Do not use dashes or single digits. - Please review there is not already an duplicate clearance open for this procedure. - If request is for dental extraction, please clarify the # of teeth to be extracted. - If the patient is currently at the dentist's office, call Pre-Op Callback Staff (MA/nurse) to input urgent request.  - If the patient is not currently in the dentist office, please route to the Pre-Op pool.  Request for surgical clearance:  What type of surgery is being performed? TURP  When is this surgery scheduled? 04-11-21  What type of clearance is required (medical clearance vs. Pharmacy clearance to hold med vs. Both)? Medical  Are there any medications that need to be held prior to surgery and how long?   Practice name and name of physician performing surgery? Dr Arnette Schaumann  What is the office phone number? [phone]   7.   What is the office fax number? [phone]  8.   Anesthesia type (None, local, MAC, general) ? General   [name] 04/12/2021, 12:30 PM  _________________________________________________________________   (provider comments below)
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
## 2021-04-12 NOTE — Telephone Encounter (Signed)
   Name: [name]  DOB: [birth year]  MRN: [id number]  Primary Cardiologist: Dietrich Pates, MD  Chart reviewed as part of pre-operative protocol coverage. Because of [name] past medical history and time since last visit, he will require a follow-up visit in order to better assess preoperative cardiovascular risk.  Pre-op covering staff: - Please schedule appointment and call patient to inform them. If patient already had an upcoming appointment within acceptable timeframe, please add "pre-op clearance" to the appointment notes so provider is aware. - Please contact requesting surgeon's office via preferred method (i.e, phone, fax) to inform them of need for appointment prior to surgery.  If applicable, this message will also be routed to pharmacy pool and/or primary cardiologist for input on holding anticoagulant/antiplatelet agent as requested below so that this information is available to the clearing provider at time of patient's appointment.   Roe Rutherford Carnelia Oscar, PA  04/12/2021, 1:27 PM

## 2021-04-14 ENCOUNTER — Other Ambulatory Visit (HOSPITAL_COMMUNITY): Payer: Medicare HMO
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
## 2021-04-28 ENCOUNTER — Encounter (HOSPITAL_BASED_OUTPATIENT_CLINIC_OR_DEPARTMENT_OTHER): Payer: Self-pay | Admitting: Family

## 2021-04-28 ENCOUNTER — Other Ambulatory Visit: Payer: Self-pay

## 2021-04-28 ENCOUNTER — Ambulatory Visit (HOSPITAL_BASED_OUTPATIENT_CLINIC_OR_DEPARTMENT_OTHER): Payer: Medicare HMO | Admitting: Family

## 2021-04-28 VITALS — BP 105/68 | HR 49 | Ht 72.0 in | Wt 158.6 lb

## 2021-04-28 DIAGNOSIS — Z720 Tobacco use: Secondary | ICD-10-CM

## 2021-04-28 DIAGNOSIS — I5022 Chronic systolic (congestive) heart failure: Secondary | ICD-10-CM | POA: Diagnosis not present

## 2021-04-28 DIAGNOSIS — Z01810 Encounter for preprocedural cardiovascular examination: Secondary | ICD-10-CM

## 2021-04-28 DIAGNOSIS — I10 Essential (primary) hypertension: Secondary | ICD-10-CM | POA: Diagnosis not present

## 2021-04-28 MED ORDER — ASPIRIN EC 81 MG PO TBEC: 81.0000 mg | DELAYED_RELEASE_TABLET | Freq: Every day | ORAL | Status: AC

## 2021-04-28 NOTE — Patient Instructions (Addendum)
Medication Instructions:  Your physician has recommended you make the following change in your medication:  STOP Carvedilol (Coreg)  *If you need a refill on your cardiac medications before your next appointment, please call your pharmacy*   Lab Work: None ordered today.  Testing/Procedures: Your EKG today showed a slow but regular heart rhythm called sinus bradycardia. We have stopped your Carvedilol (Coreg) to help get your heart rate up.   Follow-Up: At [facility], you and your health needs are our priority.  As part of our continuing mission to provide you with exceptional heart care, we have created designated Provider Care Teams.  These Care Teams include your primary Cardiologist (physician) and Advanced Practice Providers (APPs -  Physician Assistants and Nurse Practitioners) who all work together to provide you with the care you need, when you need it.  We recommend signing up for the patient portal called "MyChart".  Sign up information is provided on this After Visit Summary.  MyChart is used to connect with patients for Virtual Visits (Telemedicine).  Patients are able to view lab/test results, encounter notes, upcoming appointments, etc.  Non-urgent messages can be sent to your provider as well.   To learn more about what you can do with MyChart, go to [email or web address].    Your next appointment:   3-4 month(s)  The format for your next appointment:   In Person  Provider:   You may see Dietrich Pates, MD or one of the following Advanced Practice Providers on your designated Care Team:   Tereso Newcomer, PA-C [place], [place]   Other Instructions  Alver Sorrow, NP will send a note to Dr. Arita Miss that you are cleared for surgery. She may ask you to hold Aspirin prior to the procedure and that would be fine.   Your Lisinopril will help keep you your blood pressure controlled and help strengthen your heart.   Heart Healthy Diet Recommendations: A low-salt diet is  recommended. Meats should be grilled, baked, or boiled. Avoid fried foods. Focus on lean protein sources like fish or chicken with vegetables and fruits. The American Heart Association is a Chief Technology Officer!    Exercise recommendations: The American Heart Association recommends 150 minutes of moderate intensity exercise weekly. Try 30 minutes of moderate intensity exercise 4-5 times per week. This could include walking, jogging, or swimming.

## 2021-05-03 NOTE — Progress Notes (Addendum)
COVID Vaccine Completed: yes x3 Date COVID Vaccine completed: 12/12/19, 01/08/20 Has received booster: 10/29/20 COVID vaccine manufacturer: Pfizer      Date of COVID positive in last 90 days: No  PCP - Abbey Chatters, NP Cardiologist - Dietrich Pates, MD  Cardiac Clearance by Gillian Shields in note dated 04/28/21  Chest x-ray - N/a EKG - 04/28/21 Epic Stress Test - N/a ECHO - 12/12/15 Epic Cardiac Cath - 01/23/16 Epic Pacemaker/ICD device last checked:N/a Spinal Cord Stimulator: N/a  Sleep Study - N/a CPAP -   Fasting Blood Sugar - N/a Checks Blood Sugar _____ times a day  Blood Thinner Instructions: Aspirin Instructions: ASA 81mg , pt family member says have instructions Last Dose:  Activity level: Can go up a flight of stairs and perform activities of daily living without stopping and without symptoms of chest pain or shortness of breath.        Anesthesia review: HTN, CHF, NICM, 1st degree AV block  Patient denies shortness of breath, fever, cough and chest pain at PAT appointment   Patient verbalized understanding of instructions that were given to them at the PAT appointment. Patient was also instructed that they will need to review over the PAT instructions again at home before surgery.

## 2021-05-04 ENCOUNTER — Other Ambulatory Visit: Payer: Self-pay | Admitting: *Deleted

## 2021-05-04 NOTE — Patient Outreach (Signed)
[facility]) Care Management  05/04/2021  OSEAS DETTY 1951-02-12 [id number]  Successful telephone outreach call to patient's Aunt [name]. HIPAA identifiers obtained. Aunt explained that she did receive the brochure this nurse sent and she did review the [facility] program with the patient. Aunt explained that at this time the patient does not want to participate directly with the [facility] program. He would be interested in receiving health education periodically through the mail and that this nurse may call to inquire if there are any questions regarding the information that has been sent. Aunt did not have any further questions or concerns today and did confirm that she has this nurse's contact number to call her if needed.    Plan: Nurse will send patient education regarding TURP procedure, CHF, and will periodically send health education and call the patient to check on his health and wellness needs.  [name] RN, Mining engineer [facility] [phone] [email or web address]

## 2021-05-04 NOTE — Patient Instructions (Addendum)
DUE TO COVID-19 ONLY ONE VISITOR IS ALLOWED TO COME WITH YOU AND STAY IN THE WAITING ROOM ONLY DURING PRE OP AND PROCEDURE.   **NO VISITORS ARE ALLOWED IN THE SHORT STAY AREA OR RECOVERY ROOM!!**  IF YOU WILL BE ADMITTED INTO THE HOSPITAL YOU ARE ALLOWED ONLY TWO SUPPORT PEOPLE DURING VISITATION HOURS ONLY (10AM -8PM)   The support person(s) may change daily. The support person(s) must pass our screening, gel in and out, and wear a mask at all times, including in the patient's room. Patients must also wear a mask when staff or their support person are in the room.  No visitors under the age of 71. Any visitor under the age of 72 must be accompanied by an adult.    COVID SWAB TESTING MUST BE COMPLETED ON:  05/12/21 **MUST PRESENT COMPLETED FORM AT TESTING SITE**    [address] [place] (backside of the building) Open 8am-3pm. No appointment needed. You are not required to quarantine, however you are required to wear a well-fitted mask when you are out and around people not in your household.  Hand Hygiene often Do NOT share personal items Notify your provider if you are in close contact with someone who has COVID or you develop fever 100.4 or greater, new onset of sneezing, cough, sore throat, shortness of breath or body aches.       Your procedure is scheduled on: 05/16/21   Report to [facility] Main  Entrance    Report to admitting at 11:00 AM   Call this number if you have problems the morning of surgery [phone]   Do not eat food :After Midnight.   May have liquids until  10:00 AM day of surgery  CLEAR LIQUID DIET  Foods Allowed                                                                     Foods Excluded  Water, Black Coffee and tea, regular and decaf                liquids that you cannot  Plain Jell-O in any flavor  (No red)                                     see through such as: Fruit ices (not with fruit pulp)                                              milk, soups, orange juice              Iced Popsicles (No red)                                                All solid food  Apple juices Sports drinks like Gatorade (No red) Lightly seasoned clear broth or consume(fat free) Sugar, honey syrup    Oral Hygiene is also important to reduce your risk of infection.                                    Remember - BRUSH YOUR TEETH THE MORNING OF SURGERY WITH YOUR REGULAR TOOTHPASTE   Take these medicines the morning of surgery with A SIP OF WATER: Crestor.                               You may not have any metal on your body including hair pins, jewelry, and body piercing             Do not wear lotions, powders, cologne, or deodorant              Men may shave face and neck.   Do not bring valuables to the hospital. [facility] IS NOT             RESPONSIBLE   FOR VALUABLES.   Contacts, dentures or bridgework may not be worn into surgery.   Bring small overnight bag day of surgery.   Please read over the following fact sheets you were given: IF YOU HAVE QUESTIONS ABOUT YOUR PRE OP INSTRUCTIONS PLEASE CALL [phone]- [facility] - Preparing for Surgery Before surgery, you can play an important role.  Because skin is not sterile, your skin needs to be as free of germs as possible.  You can reduce the number of germs on your skin by washing with CHG (chlorahexidine gluconate) soap before surgery.  CHG is an antiseptic cleaner which kills germs and bonds with the skin to continue killing germs even after washing. Please DO NOT use if you have an allergy to CHG or antibacterial soaps.  If your skin becomes reddened/irritated stop using the CHG and inform your nurse when you arrive at Short Stay. Do not shave (including legs and underarms) for at least 48 hours prior to the first CHG shower.  You may shave your face/neck.  Please follow these instructions carefully:  1.  Shower with CHG  Soap the night before surgery and the  morning of surgery.  2.  If you choose to wash your hair, wash your hair first as usual with your normal  shampoo.  3.  After you shampoo, rinse your hair and body thoroughly to remove the shampoo.                             4.  Use CHG as you would any other liquid soap.  You can apply chg directly to the skin and wash.  Gently with a scrungie or clean washcloth.  5.  Apply the CHG Soap to your body ONLY FROM THE NECK DOWN.   Do   not use on face/ open                           Wound or open sores. Avoid contact with eyes, ears mouth and   genitals (private parts).                       Wash face,  Genitals (  private parts) with your normal soap.             6.  Wash thoroughly, paying special attention to the area where your    surgery  will be performed.  7.  Thoroughly rinse your body with warm water from the neck down.  8.  DO NOT shower/wash with your normal soap after using and rinsing off the CHG Soap.                9.  Pat yourself dry with a clean towel.            10.  Wear clean pajamas.            11.  Place clean sheets on your bed the night of your first shower and do not  sleep with pets. Day of Surgery : Do not apply any lotions/deodorants the morning of surgery.  Please wear clean clothes to the hospital/surgery center.  FAILURE TO FOLLOW THESE INSTRUCTIONS MAY RESULT IN THE CANCELLATION OF YOUR SURGERY  PATIENT SIGNATURE_________________________________  NURSE SIGNATURE__________________________________  ________________________________________________________________________

## 2021-05-05 ENCOUNTER — Other Ambulatory Visit: Payer: Self-pay

## 2021-05-05 ENCOUNTER — Encounter (HOSPITAL_COMMUNITY): Payer: Self-pay

## 2021-05-05 ENCOUNTER — Encounter (HOSPITAL_COMMUNITY)
Admission: RE | Admit: 2021-05-05 | Discharge: 2021-05-05 | Disposition: A | Discharge status: Home / Self Care | Payer: Medicare HMO | Source: Ambulatory Visit | Attending: Urology | Admitting: Urology

## 2021-05-05 DIAGNOSIS — I509 Heart failure, unspecified: Secondary | ICD-10-CM | POA: Diagnosis not present

## 2021-05-05 DIAGNOSIS — I110 Hypertensive heart disease with heart failure: Secondary | ICD-10-CM | POA: Diagnosis not present

## 2021-05-05 DIAGNOSIS — Z87891 Personal history of nicotine dependence: Secondary | ICD-10-CM | POA: Insufficient documentation

## 2021-05-05 DIAGNOSIS — Z7982 Long term (current) use of aspirin: Secondary | ICD-10-CM | POA: Insufficient documentation

## 2021-05-05 DIAGNOSIS — Z79899 Other long term (current) drug therapy: Secondary | ICD-10-CM | POA: Diagnosis not present

## 2021-05-05 DIAGNOSIS — N401 Enlarged prostate with lower urinary tract symptoms: Secondary | ICD-10-CM | POA: Diagnosis not present

## 2021-05-05 DIAGNOSIS — N138 Other obstructive and reflux uropathy: Secondary | ICD-10-CM | POA: Diagnosis not present

## 2021-05-05 DIAGNOSIS — Z01812 Encounter for preprocedural laboratory examination: Secondary | ICD-10-CM | POA: Diagnosis not present

## 2021-05-05 LAB — CBC
HCT: 35.5 % — ABNORMAL LOW (ref 39.0–52.0)
Hemoglobin: 12.2 g/dL — ABNORMAL LOW (ref 13.0–17.0)
MCH: 33.5 pg (ref 26.0–34.0)
MCHC: 34.4 g/dL (ref 30.0–36.0)
MCV: 97.5 fL (ref 80.0–100.0)
Platelets: 197 10*3/uL (ref 150–400)
RBC: 3.64 MIL/uL — ABNORMAL LOW (ref 4.22–5.81)
RDW: 12 % (ref 11.5–15.5)
WBC: 2.6 10*3/uL — ABNORMAL LOW (ref 4.0–10.5)
nRBC: 0 % (ref 0.0–0.2)

## 2021-05-05 LAB — BASIC METABOLIC PANEL
Anion gap: 6 (ref 5–15)
BUN: 14 mg/dL (ref 8–23)
CO2: 28 mmol/L (ref 22–32)
Calcium: 9.2 mg/dL (ref 8.9–10.3)
Chloride: 107 mmol/L (ref 98–111)
Creatinine, Ser: 0.75 mg/dL (ref 0.61–1.24)
GFR, Estimated: >60 mL/min (ref >60)
Glucose, Bld: 106 mg/dL — ABNORMAL HIGH (ref 70–99)
Potassium: 4.2 mmol/L (ref 3.5–5.1)
Sodium: 141 mmol/L (ref 135–145)

## 2021-05-08 NOTE — Anesthesia Preprocedure Evaluation (Addendum)
Anesthesia Evaluation  Patient identified by MRN, date of birth, ID band Patient awake    Reviewed: Allergy & Precautions, NPO status , Patient's Chart, lab work & pertinent test results  History of Anesthesia Complications Negative for: history of anesthetic complications  Airway Mallampati: I  TM Distance: >3 FB Neck ROM: Full    Dental  (+) Edentulous Upper, Edentulous Lower   Pulmonary neg shortness of breath, neg sleep apnea, neg COPD, neg recent URI, former smoker,    breath sounds clear to auscultation       Cardiovascular hypertension, Pt. on medications (-) angina+CHF  (-) Past MI (-) dysrhythmias  Rhythm:Regular  Left ventricle: The cavity size was moderately dilated. Systolic  function was severely reduced. The estimated ejection fraction  was in the range of 20% to 25%. Severe diffuse hypokinesis. There  was an increased relative contribution of atrial contraction to  ventricular filling. Doppler parameters are consistent with  abnormal left ventricular relaxation (grade 1 diastolic  dysfunction).  - Aortic valve: Trileaflet; mildly thickened leaflets.  - Mitral valve: Mild diffuse thickening and elongation of the  anterior leaflet.  - Right ventricle: The cavity size was mildly dilated. Wall  thickness was normal.   ? There is moderate to severe left ventricular systolic dysfunction.   1. Normal coronary arteries. 2. Moderate to severe LV dysfunction. EF 35-40%  Plan: medical therapy.    Neuro/Psych PSYCHIATRIC DISORDERS Depression  Neuromuscular disease    GI/Hepatic Neg liver ROS, GERD  Controlled,  Endo/Other  negative endocrine ROS  Renal/GU Lab Results      Component                Value               Date                      CREATININE               0.75                05/05/2021           Lab Results      Component                Value               Date                      K                         4.2                 05/05/2021                Musculoskeletal negative musculoskeletal ROS (+)   Abdominal   Peds  Hematology  (+) Blood dyscrasia, anemia , Lab Results      Component                Value               Date                      WBC                      2.6 (L)  05/05/2021                HGB                      12.2 (L)            05/05/2021                HCT                      35.5 (L)            05/05/2021                MCV                      97.5                05/05/2021                PLT                      197                 05/05/2021              Anesthesia Other Findings   Reproductive/Obstetrics                            Anesthesia Physical Anesthesia Plan  ASA: 3  Anesthesia Plan: General   Post-op Pain Management:    Induction: Intravenous  PONV Risk Score and Plan: 2 and Ondansetron and Dexamethasone  Airway Management Planned: LMA and Oral ETT  Additional Equipment: None  Intra-op Plan:   Post-operative Plan: Extubation in OR  Informed Consent: I have reviewed the patients History and Physical, chart, labs and discussed the procedure including the risks, benefits and alternatives for the proposed anesthesia with the patient or authorized representative who has indicated his/her understanding and acceptance.     Dental advisory given  Plan Discussed with: CRNA and Surgeon  Anesthesia Plan Comments: (See PAT note 05/05/2021, Jodell Cipro, PA-C)       Anesthesia Quick Evaluation
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
## 2021-05-12 ENCOUNTER — Other Ambulatory Visit: Payer: Self-pay | Admitting: Urology

## 2021-05-16 ENCOUNTER — Ambulatory Visit (HOSPITAL_COMMUNITY): Payer: Medicare HMO | Admitting: Anesthesiology

## 2021-05-16 ENCOUNTER — Ambulatory Visit (HOSPITAL_COMMUNITY): Payer: Medicare HMO | Admitting: Physician Assistant

## 2021-05-16 ENCOUNTER — Encounter (HOSPITAL_COMMUNITY): Payer: Self-pay | Admitting: Urology

## 2021-05-16 ENCOUNTER — Other Ambulatory Visit: Payer: Self-pay

## 2021-05-16 ENCOUNTER — Encounter (HOSPITAL_COMMUNITY)
Admission: RE | Disposition: A | Discharge status: Home / Self Care | Payer: Self-pay | Source: Home / Self Care | Attending: Urology

## 2021-05-16 ENCOUNTER — Ambulatory Visit (HOSPITAL_COMMUNITY)
Admission: RE | Admit: 2021-05-16 | Discharge: 2021-05-17 | Disposition: A | Discharge status: Home / Self Care | Payer: Medicare HMO | Attending: Urology | Admitting: Urology

## 2021-05-16 DIAGNOSIS — F17210 Nicotine dependence, cigarettes, uncomplicated: Secondary | ICD-10-CM | POA: Insufficient documentation

## 2021-05-16 DIAGNOSIS — N401 Enlarged prostate with lower urinary tract symptoms: Secondary | ICD-10-CM | POA: Insufficient documentation

## 2021-05-16 DIAGNOSIS — I5022 Chronic systolic (congestive) heart failure: Secondary | ICD-10-CM | POA: Diagnosis not present

## 2021-05-16 DIAGNOSIS — N138 Other obstructive and reflux uropathy: Secondary | ICD-10-CM | POA: Diagnosis not present

## 2021-05-16 DIAGNOSIS — R310 Gross hematuria: Secondary | ICD-10-CM | POA: Diagnosis not present

## 2021-05-16 DIAGNOSIS — R3915 Urgency of urination: Secondary | ICD-10-CM | POA: Diagnosis not present

## 2021-05-16 DIAGNOSIS — R69 Illness, unspecified: Secondary | ICD-10-CM | POA: Diagnosis not present

## 2021-05-16 DIAGNOSIS — R3912 Poor urinary stream: Secondary | ICD-10-CM | POA: Insufficient documentation

## 2021-05-16 DIAGNOSIS — I110 Hypertensive heart disease with heart failure: Secondary | ICD-10-CM | POA: Diagnosis not present

## 2021-05-16 HISTORY — PX: TRANSURETHRAL RESECTION OF PROSTATE: SHX73

## 2021-05-16 SURGERY — TURP (TRANSURETHRAL RESECTION OF PROSTATE)
Anesthesia: General | Site: Prostate

## 2021-05-16 MED ORDER — ACETAMINOPHEN 500 MG PO TABS: 1000.0000 mg | ORAL_TABLET | Freq: Once | ORAL | Status: DC | PRN

## 2021-05-16 MED ORDER — FENTANYL CITRATE (PF) 100 MCG/2ML IJ SOLN
INTRAMUSCULAR | Status: DC | PRN
  Administered 2021-05-16 (×2): 50 ug via INTRAVENOUS

## 2021-05-16 MED ORDER — ACETAMINOPHEN 160 MG/5ML PO SOLN: 1000.0000 mg | Freq: Once | ORAL | Status: DC | PRN

## 2021-05-16 MED ORDER — BACITRACIN-NEOMYCIN-POLYMYXIN 400-5-5000 EX OINT: 1.0000 "application " | TOPICAL_OINTMENT | Freq: Three times a day (TID) | CUTANEOUS | Status: DC | PRN

## 2021-05-16 MED ORDER — LIDOCAINE HCL (CARDIAC) PF 100 MG/5ML IV SOSY
PREFILLED_SYRINGE | INTRAVENOUS | Status: DC | PRN
  Administered 2021-05-16: 100 mg via INTRAVENOUS

## 2021-05-16 MED ORDER — OXYCODONE HCL 5 MG/5ML PO SOLN: 5.0000 mg | Freq: Once | ORAL | Status: DC | PRN

## 2021-05-16 MED ORDER — LISINOPRIL 20 MG PO TABS
20.0000 mg | ORAL_TABLET | Freq: Every day | ORAL | Status: DC
  Administered 2021-05-16 – 2021-05-17 (×2): 20 mg via ORAL
  Filled 2021-05-16 (×3): qty 1

## 2021-05-16 MED ORDER — DIPHENHYDRAMINE HCL 50 MG/ML IJ SOLN: 12.5000 mg | Freq: Four times a day (QID) | INTRAMUSCULAR | Status: DC | PRN

## 2021-05-16 MED ORDER — OXYCODONE HCL 5 MG PO TABS: 5.0000 mg | ORAL_TABLET | Freq: Once | ORAL | Status: DC | PRN

## 2021-05-16 MED ORDER — HYDROCODONE-ACETAMINOPHEN 5-325 MG PO TABS: 1.0000 | ORAL_TABLET | ORAL | Status: DC | PRN

## 2021-05-16 MED ORDER — FENTANYL CITRATE (PF) 100 MCG/2ML IJ SOLN: 25.0000 ug | INTRAMUSCULAR | Status: DC | PRN

## 2021-05-16 MED ORDER — SODIUM CHLORIDE 0.9% FLUSH: 3.0000 mL | INTRAVENOUS | Status: DC | PRN

## 2021-05-16 MED ORDER — ONDANSETRON HCL 4 MG/2ML IJ SOLN
INTRAMUSCULAR | Status: DC | PRN
  Administered 2021-05-16: 4 mg via INTRAVENOUS

## 2021-05-16 MED ORDER — SODIUM CHLORIDE 0.9% FLUSH: 3.0000 mL | Freq: Two times a day (BID) | INTRAVENOUS | Status: DC

## 2021-05-16 MED ORDER — ACETAMINOPHEN 500 MG PO TABS
1000.0000 mg | ORAL_TABLET | Freq: Four times a day (QID) | ORAL | Status: DC
  Administered 2021-05-16 – 2021-05-17 (×3): 1000 mg via ORAL
  Filled 2021-05-16 (×3): qty 2

## 2021-05-16 MED ORDER — MORPHINE SULFATE (PF) 4 MG/ML IV SOLN: 2.0000 mg | INTRAVENOUS | Status: DC | PRN

## 2021-05-16 MED ORDER — ROSUVASTATIN CALCIUM 5 MG PO TABS
5.0000 mg | ORAL_TABLET | Freq: Every day | ORAL | Status: DC
  Administered 2021-05-16 – 2021-05-17 (×2): 5 mg via ORAL
  Filled 2021-05-16 (×3): qty 1

## 2021-05-16 MED ORDER — MIDAZOLAM HCL 2 MG/2ML IJ SOLN
INTRAMUSCULAR | Status: AC
  Filled 2021-05-16: qty 2

## 2021-05-16 MED ORDER — PROPOFOL 10 MG/ML IV BOLUS
INTRAVENOUS | Status: AC
  Filled 2021-05-16: qty 40

## 2021-05-16 MED ORDER — ORAL CARE MOUTH RINSE: 15.0000 mL | Freq: Once | OROMUCOSAL | Status: AC

## 2021-05-16 MED ORDER — SUGAMMADEX SODIUM 500 MG/5ML IV SOLN
INTRAVENOUS | Status: DC | PRN
Start: 2021-05-16 — End: 2021-05-16
  Administered 2021-05-16: 300 mg via INTRAVENOUS

## 2021-05-16 MED ORDER — SODIUM CHLORIDE 0.9 % IV SOLN: 250.0000 mL | INTRAVENOUS | Status: DC | PRN

## 2021-05-16 MED ORDER — ACETAMINOPHEN 10 MG/ML IV SOLN
INTRAVENOUS | Status: AC
  Filled 2021-05-16: qty 100

## 2021-05-16 MED ORDER — BELLADONNA ALKALOIDS-OPIUM 16.2-60 MG RE SUPP
1.0000 | Freq: Four times a day (QID) | RECTAL | Status: DC | PRN
Start: 2021-05-16 — End: 2021-05-17

## 2021-05-16 MED ORDER — SODIUM CHLORIDE 0.9 % IR SOLN
3000.0000 mL | Status: DC
  Administered 2021-05-16 (×3): 3000 mL

## 2021-05-16 MED ORDER — MIDAZOLAM HCL 5 MG/5ML IJ SOLN
INTRAMUSCULAR | Status: DC | PRN
  Administered 2021-05-16: 2 mg via INTRAVENOUS

## 2021-05-16 MED ORDER — ACETAMINOPHEN 10 MG/ML IV SOLN
INTRAVENOUS | Status: DC | PRN
  Administered 2021-05-16: 1000 mg via INTRAVENOUS

## 2021-05-16 MED ORDER — STERILE WATER FOR IRRIGATION IR SOLN
Status: DC | PRN
  Administered 2021-05-16: 500 mL

## 2021-05-16 MED ORDER — DIPHENHYDRAMINE HCL 12.5 MG/5ML PO ELIX: 12.5000 mg | ORAL_SOLUTION | Freq: Four times a day (QID) | ORAL | Status: DC | PRN

## 2021-05-16 MED ORDER — CHLORHEXIDINE GLUCONATE 0.12 % MT SOLN
15.0000 mL | Freq: Once | OROMUCOSAL | Status: AC
  Administered 2021-05-16: 15 mL via OROMUCOSAL

## 2021-05-16 MED ORDER — DEXAMETHASONE SODIUM PHOSPHATE 10 MG/ML IJ SOLN
INTRAMUSCULAR | Status: DC | PRN
  Administered 2021-05-16: 10 mg via INTRAVENOUS

## 2021-05-16 MED ORDER — ONDANSETRON HCL 4 MG/2ML IJ SOLN: 4.0000 mg | INTRAMUSCULAR | Status: DC | PRN

## 2021-05-16 MED ORDER — SENNOSIDES-DOCUSATE SODIUM 8.6-50 MG PO TABS
2.0000 | ORAL_TABLET | Freq: Every day | ORAL | Status: DC
  Administered 2021-05-16: 2 via ORAL
  Filled 2021-05-16: qty 2

## 2021-05-16 MED ORDER — SODIUM CHLORIDE 0.9 % IR SOLN
Status: DC | PRN
  Administered 2021-05-16: 6000 mL

## 2021-05-16 MED ORDER — GLYCERIN (PF) 0.25 % OP SOLN: 1.0000 [drp] | OPHTHALMIC | Status: DC | PRN

## 2021-05-16 MED ORDER — CEFAZOLIN SODIUM-DEXTROSE 2-4 GM/100ML-% IV SOLN
2.0000 g | INTRAVENOUS | Status: AC
  Administered 2021-05-16: 2 g via INTRAVENOUS
  Filled 2021-05-16: qty 100

## 2021-05-16 MED ORDER — LACTATED RINGERS IV SOLN: INTRAVENOUS | Status: DC

## 2021-05-16 MED ORDER — PHENYLEPHRINE HCL (PRESSORS) 10 MG/ML IV SOLN
INTRAVENOUS | Status: DC | PRN
  Administered 2021-05-16: 40 ug via INTRAVENOUS

## 2021-05-16 MED ORDER — PROPOFOL 10 MG/ML IV BOLUS
INTRAVENOUS | Status: DC | PRN
  Administered 2021-05-16: 60 mg via INTRAVENOUS

## 2021-05-16 MED ORDER — FENTANYL CITRATE (PF) 100 MCG/2ML IJ SOLN
INTRAMUSCULAR | Status: AC
  Filled 2021-05-16: qty 2

## 2021-05-16 MED ORDER — DEXTROSE-NACL 5-0.45 % IV SOLN: INTRAVENOUS | Status: DC

## 2021-05-16 MED ORDER — ACETAMINOPHEN 10 MG/ML IV SOLN: 1000.0000 mg | Freq: Once | INTRAVENOUS | Status: DC | PRN

## 2021-05-16 MED ORDER — ROCURONIUM BROMIDE 100 MG/10ML IV SOLN
INTRAVENOUS | Status: DC | PRN
  Administered 2021-05-16: 70 mg via INTRAVENOUS

## 2021-05-16 MED ORDER — TRAZODONE HCL 100 MG PO TABS
100.0000 mg | ORAL_TABLET | Freq: Every evening | ORAL | Status: DC | PRN
  Filled 2021-05-16: qty 1

## 2021-05-16 MED ORDER — 0.9 % SODIUM CHLORIDE (POUR BTL) OPTIME
TOPICAL | Status: DC | PRN
  Administered 2021-05-16: 1000 mL

## 2021-05-16 SURGICAL SUPPLY — 17 items
BAG URINE DRAIN 2000ML AR STRL (UROLOGICAL SUPPLIES) IMPLANT
BAG URO CATCHER STRL LF (MISCELLANEOUS) ×2 IMPLANT
CATH FOLEY 3WAY 30CC 22FR (CATHETERS) ×2 IMPLANT
CATH HEMA 3WAY 30CC 22FR COUDE (CATHETERS) IMPLANT
DRAPE FOOT SWITCH (DRAPES) ×2 IMPLANT
GLOVE SURG ENC MOIS LTX SZ6.5 (GLOVE) ×2 IMPLANT
HOLDER FOLEY CATH W/STRAP (MISCELLANEOUS) ×2 IMPLANT
KIT TURNOVER KIT A (KITS) ×2 IMPLANT
LOOP CUT BIPOLAR 24F LRG (ELECTROSURGICAL) ×2 IMPLANT
MANIFOLD NEPTUNE II (INSTRUMENTS) ×2 IMPLANT
PACK CYSTO (CUSTOM PROCEDURE TRAY) ×2 IMPLANT
PLUG CATH AND CAP STER (CATHETERS) ×2 IMPLANT
SYR 30ML LL (SYRINGE) IMPLANT
SYR TOOMEY IRRIG 70ML (MISCELLANEOUS) ×2
SYRINGE TOOMEY IRRIG 70ML (MISCELLANEOUS) ×1 IMPLANT
TUBING CONNECTING 10 (TUBING) ×2 IMPLANT
TUBING UROLOGY SET (TUBING) ×2 IMPLANT

## 2021-05-16 NOTE — Op Note (Signed)
Preoperative diagnosis: Bladder outlet obstruction secondary to BPH  Postoperative diagnosis:  Bladder outlet obstruction secondary to BPH  Procedure:  Cystoscopy Transurethral resection of the prostate  Surgeon: Kasandra Knudsen, M.D.  Anesthesia: General  Complications: None  EBL: Minimal  Specimens: Prostate chips  Findings:  Normal anterior urethra Lateral lobe prostatic hypertrophy with right greater than left, no significant intravesical median lobe Orthotopic bilateral ureteral orifices effluxing clear urine No bladder masses or lesions, small diverticulum seen at dome  Indication: [name] is a patient with bladder outlet obstruction secondary to benign prostatic hyperplasia. After reviewing the management options for treatment, he elected to proceed with the above surgical procedure(s). We have discussed the potential benefits and risks of the procedure, side effects of the proposed treatment, the likelihood of the patient achieving the goals of the procedure, and any potential problems that might occur during the procedure or recuperation. Informed consent has been obtained.  Description of procedure:  The patient was taken to the operating room and general anesthesia was induced.  The patient was placed in the dorsal lithotomy position, prepped and draped in the usual sterile fashion, and preoperative antibiotics were administered. A preoperative time-out was performed.   Cystourethroscopy was performed.  The patient's urethra was examined and demonstrated bilobar prostatic hypertrophy.  The bladder was then systematically examined in its entirety. There was no evidence of any bladder tumors, stones, or other mucosal pathology.  The ureteral orifices were identified and marked so as to be avoided during the procedure.  The prostate adenoma was then resected utilizing loop cautery resection with the bipolar cutting loop.  The prostate adenoma from the bladder  neck back to the verumontanum was resected beginning at the six o'clock position and then extended to include the right and left lobes of the prostate and anterior prostate. Care was taken not to resect distal to the verumontanum.   Hemostasis was then achieved with the cautery and the bladder was emptied and reinspected with no significant bleeding noted at the end of the procedure.    A 22Fr 3-way catheter was then placed into the bladder.  The patient appeared to tolerate the procedure well and without complications.  The patient was able to be awakened and transferred to the recovery unit in satisfactory condition.

## 2021-05-16 NOTE — Anesthesia Procedure Notes (Signed)
Procedure Name: Intubation Date/Time: 05/16/2021 1:44 PM Performed by: Lyndal Pulley, CRNA Pre-anesthesia Checklist: Patient identified, Emergency Drugs available, Suction available and Patient being monitored Patient Re-evaluated:Patient Re-evaluated prior to induction Oxygen Delivery Method: Circle system utilized Preoxygenation: Pre-oxygenation with 100% oxygen Induction Type: IV induction Ventilation: Mask ventilation without difficulty Laryngoscope Size: Mac and 4 Grade View: Grade I Tube type: Oral Tube size: 7.5 mm Number of attempts: 1 Airway Equipment and Method: Stylet and Oral airway Placement Confirmation: ETT inserted through vocal cords under direct vision, positive ETCO2 and breath sounds checked- equal and bilateral Secured at: 22 cm Tube secured with: Tape Dental Injury: Teeth and Oropharynx as per pre-operative assessment

## 2021-05-16 NOTE — Interval H&P Note (Signed)
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  The various methods of treatment have been discussed with the patient and family. After consideration of risks, benefits and other options for treatment, the patient has consented to  Procedure(s) with comments: TRANSURETHRAL RESECTION OF THE PROSTATE (TURP) (N/A) - 90 MINS as a surgical intervention.  The patient's history has been reviewed, patient examined, no change in status, stable for surgery.  I have reviewed the patient's chart and labs.  Questions were answered to the patient's satisfaction.     Lashanti Chambless D Serapio Edelson

## 2021-05-16 NOTE — Progress Notes (Signed)
Patient arrived to unit, [facility] w/CBI intact, VS obtained, oriented to unit, call light placed in reach , family at bedside

## 2021-05-16 NOTE — Transfer of Care (Signed)
Immediate Anesthesia Transfer of Care Note  Patient: [name]  Procedure(s) Performed: TRANSURETHRAL RESECTION OF THE PROSTATE (TURP) (Prostate)  Patient Location: PACU  Anesthesia Type:General  Level of Consciousness: drowsy and patient cooperative  Airway & Oxygen Therapy: Patient Spontanous Breathing and Patient connected to face mask oxygen  Post-op Assessment: Report given to RN and Post -op Vital signs reviewed and stable  Post vital signs: Reviewed and stable  Last Vitals:  Vitals Value Taken Time  BP 155/103 05/16/21 1448  Temp    Pulse 71 05/16/21 1450  Resp 15 05/16/21 1450  SpO2 100 % 05/16/21 1450  Vitals shown include unvalidated device data.  Last Pain:  Vitals:   05/16/21 1054  TempSrc: Oral  PainSc: 0-No pain         Complications: No notable events documented.

## 2021-05-16 NOTE — H&P (Signed)
/HPI: cc: Prostate cancer screening   02/07/21: 70 year old man last seen at [facility] in 2018 for ED. Patient has a history of congestive heart failure, cocaine use, alcohol abuse, hypertension and depression. He has a history of BPH with lower urinary tract symptoms. Patient reports he has had gross hematuria intermittently. He is here with his on today. Neither 1 is able to communicate much about his medical history. He does report nocturia x3, urinary urgency and weak stream. He is unable to complete the IPSS. Patient was told that he had prostate cancer before but denies ever having a prostate biopsy. He states he has no family history of prostate cancer.   03/03/21: Cysto and CT scan results, the flomax has helped a little bit but still struggling to empty and having signifcant straining.     ALLERGIES: None   MEDICATIONS: Tamsulosin Hcl 0.4 mg capsule 1 capsule PO Q HS  Tamsulosin Hcl 0.4 mg capsule 1 capsule PO Q HS  Carvedilol 3.125 mg tablet  Entresto 24 mg-26 mg tablet  Trazodone Hcl 100 mg tablet     GU PSH: Locm 300-399Mg /Ml Iodine,1Ml - 02/21/2021, 02/21/2021     NON-GU PSH: Anesth, Catheterize Heart - 2017 Knee Arthroscopy, Left Shoulder Surgery (Unspecified), Right     GU PMH: Gross hematuria - 02/21/2021, - 02/21/2021, - 02/07/2021 BPH w/LUTS - 02/07/2021, - 2018 Encounter for Prostate Cancer screening - 02/07/2021 Nocturia - 02/07/2021 Urinary Frequency - 02/07/2021 Weak Urinary Stream - 02/07/2021 ED due to arterial insufficiency - 2018    NON-GU PMH: Alcohol dependence, uncomplicated Chronic systolic (congestive) heart failure Cocaine use, unspecified, uncomplicated Dilated cardiomyopathy GERD Heart disease, unspecified Hypertension Major depressive disorder, single episode, severe without psychotic features    FAMILY HISTORY: Breast Cancer - Aunt Deceased - Mother Diabetes - Aunt, Grandmother Lung Cancer - Grandfather pancreatic cancer - Mother stroke  - Uncle, Grandmother throat cancer - Mother, Uncle   SOCIAL HISTORY: Marital Status: Single Preferred Language: English; Ethnicity: Not Hispanic Or Latino; Race: Black or African American Current Smoking Status: Patient smokes. Smokes less than 1/2 pack per day.   Tobacco Use Assessment Completed: Used Tobacco in last 30 days? Types of alcohol consumed: Beer. Moderate Drinker.  Drinks 3 caffeinated drinks per day.    REVIEW OF SYSTEMS:    GU Review Male:   Patient denies frequent urination, hard to postpone urination, burning/ pain with urination, get up at night to urinate, leakage of urine, stream starts and stops, trouble starting your stream, have to strain to urinate , erection problems, and penile pain.  Gastrointestinal (Upper):   Patient denies nausea, vomiting, and indigestion/ heartburn.  Gastrointestinal (Lower):   Patient denies diarrhea and constipation.  Constitutional:   Patient denies weight loss, fever, night sweats, and fatigue.  Skin:   Patient denies skin rash/ lesion and itching.  Eyes:   Patient denies blurred vision and double vision.  Ears/ Nose/ Throat:   Patient denies sore throat and sinus problems.  Hematologic/Lymphatic:   Patient denies swollen glands and easy bruising.  Cardiovascular:   Patient denies leg swelling and chest pains.  Respiratory:   Patient denies cough and shortness of breath.  Endocrine:   Patient denies excessive thirst.  Musculoskeletal:   Patient denies back pain and joint pain.  Neurological:   Patient denies headaches and dizziness.  Psychologic:   Patient denies depression and anxiety.   VITAL SIGNS: None   MULTI-SYSTEM PHYSICAL EXAMINATION:    Constitutional: Well-nourished. No physical deformities. Normally developed.  Good grooming.  Neck: Neck symmetrical, not swollen. Normal tracheal position.  Respiratory: No labored breathing, no use of accessory muscles.   Skin: No paleness, no jaundice, no cyanosis. No lesion, no ulcer,  no rash.  Neurologic / Psychiatric: Oriented to time, oriented to place, oriented to person. No depression, no anxiety, no agitation.  Gastrointestinal: No rigidity, non obese abdomen.   Eyes: Normal conjunctivae. Normal eyelids.  Ears, Nose, Mouth, and Throat: Left ear no scars, no lesions, no masses. Right ear no scars, no lesions, no masses. Nose no scars, no lesions, no masses. Normal hearing. Normal lips.  Musculoskeletal: Normal gait and station of head and neck.     Complexity of Data:  Lab Test Review:   PSA  Records Review:   Previous Patient Records  Urine Test Review:   Urinalysis  X-Ray Review: C.T. Hematuria: Reviewed Films. Reviewed Report. Discussed With Patient. Patient was given a copy of his radiology report    02/07/21 12/14/16  PSA  Total PSA 1.75 ng/mL 1.31 ng/dl    PROCEDURES:         Flexible Cystoscopy - 52000  Risks, benefits, and some of the potential complications of the procedure were discussed at length with the patient including infection, bleeding, voiding discomfort, urinary retention, fever, chills, sepsis, and others. All questions were answered. Informed consent was obtained. Sterile technique and intraurethral analgesia were used.  Meatus:  Normal size. Normal location. Normal condition.  Urethra:  No strictures.  External Sphincter:  Normal.  Verumontanum:  Normal.  Prostate:  Obstructing. Enlarged median lobe.   Bladder Neck:  Non-obstructing.  Ureteral Orifices:  Normal location. Normal size. Normal shape. Effluxed clear urine.  Bladder:  Mild trabeculation. No tumors. Normal mucosa. No stones.      The lower urinary tract was carefully examined. The procedure was well-tolerated and without complications. . Instructions were given to call the office immediately for bloody urine, difficulty urinating, urinary retention, painful or frequent urination, fever, chills, nausea, vomiting or other illness. The patient stated that he understood these  instructions and would comply with them.         Urinalysis w/Scope Dipstick Dipstick Cont'd Micro  Color: Amber Bilirubin: Neg mg/dL WBC/hpf: 0 - 5/hpf  Appearance: Clear Ketones: Neg mg/dL RBC/hpf: 0 - 2/hpf  Specific Gravity: 1.025 Blood: Neg ery/uL Bacteria: Rare (0-9/hpf)  pH: 5.5 Protein: 1+ mg/dL Cystals: NS (Not Seen)  Glucose: Neg mg/dL Urobilinogen: 1.0 mg/dL Casts: NS (Not Seen)    Nitrites: Neg Trichomonas: Not Present    Leukocyte Esterase: Neg leu/uL Mucous: Present      Epithelial Cells: 0 - 5/hpf      Yeast: NS (Not Seen)      Sperm: Not Present    ASSESSMENT:      ICD-10 Details  1 GU:   BPH w/LUTS - N40.1 Chronic, Stable  2   Gross hematuria - R31.0 Undiagnosed New Problem  3   Urinary Frequency - R35.0 Chronic, Stable  4   Weak Urinary Stream - R39.12 Chronic, Stable   PLAN:           Document Letter(s):  Created for Patient: Clinical Summary         Notes:   I reviewed patient's CT scan and gave patient copy of radiology report. No source of hematuria identified. Cystoscopy showed significant trial over prosthetic hypertrophy with bladder outlet obstruction. I discussed findings with patient and his aunt. We discussed risks and benefits of procedures for bladder outlet  obstruction including TURP, urolift and Rezum. Risks and benefits of TURP were discussed with the patient in detail and patient would like to proceed with surgery.

## 2021-05-16 NOTE — Discharge Instructions (Signed)

## 2021-05-17 ENCOUNTER — Encounter (HOSPITAL_COMMUNITY): Payer: Self-pay | Admitting: Urology

## 2021-05-17 DIAGNOSIS — R3912 Poor urinary stream: Secondary | ICD-10-CM | POA: Diagnosis not present

## 2021-05-17 DIAGNOSIS — N138 Other obstructive and reflux uropathy: Secondary | ICD-10-CM | POA: Diagnosis not present

## 2021-05-17 DIAGNOSIS — N401 Enlarged prostate with lower urinary tract symptoms: Secondary | ICD-10-CM | POA: Diagnosis not present

## 2021-05-17 DIAGNOSIS — R310 Gross hematuria: Secondary | ICD-10-CM | POA: Diagnosis not present

## 2021-05-17 DIAGNOSIS — R69 Illness, unspecified: Secondary | ICD-10-CM | POA: Diagnosis not present

## 2021-05-17 DIAGNOSIS — R3915 Urgency of urination: Secondary | ICD-10-CM | POA: Diagnosis not present

## 2021-05-17 LAB — SARS CORONAVIRUS 2 (TAT 6-24 HRS): SARS Coronavirus 2: NEGATIVE

## 2021-05-17 LAB — SURGICAL PATHOLOGY

## 2021-05-17 MED ORDER — CHLORHEXIDINE GLUCONATE CLOTH 2 % EX PADS: 6.0000 | MEDICATED_PAD | Freq: Every day | CUTANEOUS | Status: DC

## 2021-05-17 NOTE — Discharge Summary (Signed)
Date of admission: 05/16/2021  Date of discharge: 05/17/2021  Admission diagnosis: BPH with bladder outlet obstruction  Discharge diagnosis: same  Secondary diagnoses:  Patient Active Problem List   Diagnosis Date Noted   BPH with obstruction/lower urinary tract symptoms 05/16/2021   Wrist pain, acute, left 03/21/2021   Lumbar radiculopathy, right 01/03/2018   Right hip pain 12/03/2017   Benign prostatic hyperplasia with weak urinary stream 06/07/2017   Essential hypertension 04/26/2017   Seasonal allergies 05/25/2016   Severe major depression, single episode, without psychotic features ([place]) 03/04/2016   Alcohol use disorder, severe, dependence ([place]) 03/04/2016   Cocaine use disorder, moderate, dependence ([place]) 03/04/2016   NICM (nonischemic cardiomyopathy) ([place]) 40/05/6760   Chronic systolic CHF (congestive heart failure) ([place]) 01/23/2016   Epigastric abdominal pain 95/06/3266   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   Diarrhea 06/13/2011   DERMATOPHYTOSIS OF SCALP AND BEARD 03/18/2008   TOBACCO USER 01/02/2008   Alcohol abuse 11/28/2006    Procedures performed: Procedure(s): TRANSURETHRAL RESECTION OF THE PROSTATE (TURP)  History and Physical: For full details, please see admission history and physical. Briefly, [name] is a 70 y.o. year old patient with history of BPH with BOO.  He underwent TURP on 05/16/21.   Hospital Course: Patient tolerated the procedure well.  He was then transferred to the floor after an uneventful PACU stay.  His hospital course was uncomplicated.  On POD#1 he had met discharge criteria: was eating a regular diet, was up and ambulating independently,  pain was well controlled, continuous bladder irrigation was weaned off and foley draining well, and was ready to for discharge.   Laboratory values:  No results for input(s): WBC, HGB, HCT in the last 72 hours. No results for input(s): NA, K, CL, CO2, GLUCOSE, BUN, CREATININE, CALCIUM in the last  72 hours. No results for input(s): LABPT, INR in the last 72 hours. No results for input(s): LABURIN in the last 72 hours. Results for orders placed or performed in visit on 10/21/15  Microscopic Examination     Status: Abnormal   Collection Time: 10/28/15  4:15 PM   URINE  Result Value Ref Range Status   WBC, UA 0-5 0 - 5 /hpf Final   RBC, UA 3-10 (A) 0 - 2 /hpf Final   Epithelial Cells (non renal) None seen 0 - 10 /hpf Final   Casts None seen None seen /lpf Final   Mucus, UA Present Not Estab. Final   Bacteria, UA Few None seen/Few Final    Disposition: Home  Discharge instruction: The patient was instructed to be ambulatory but told to refrain from heavy lifting, strenuous activity, or driving.   Discharge medications:  Allergies as of 05/17/2021   No Known Allergies      Medication List     TAKE these medications    aspirin EC 81 MG tablet Take 1 tablet (81 mg total) by mouth daily.   Clear Eyes Pure Relief PF 0.25 % Soln Generic drug: Glycerin (PF) Place 1-2 drops into the left eye as needed (for redness).   lisinopril 20 MG tablet Commonly known as: ZESTRIL Take 1 tablet (20 mg total) by mouth daily.   rosuvastatin 5 MG tablet Commonly known as: Crestor Take 1 tablet (5 mg total) by mouth daily.   traZODone 100 MG tablet Commonly known as: DESYREL Take 1 tablet (100 mg total) by mouth at bedtime as needed for sleep.        Followup:   Follow-up Information  [facility] Follow up.   Contact information: [address] [place] [place] [phone]

## 2021-05-17 NOTE — Progress Notes (Signed)
Patient and his family given discharge, follow up, medication and foley care and leg bag instructions, verbalized understanding, IV removed, personal belongings with patient, family to transport home

## 2021-05-17 NOTE — Plan of Care (Signed)

## 2021-05-17 NOTE — Anesthesia Postprocedure Evaluation (Signed)
Anesthesia Post Note  Patient: [name]  Procedure(s) Performed: TRANSURETHRAL RESECTION OF THE PROSTATE (TURP) (Prostate)     Patient location during evaluation: PACU Anesthesia Type: General Level of consciousness: awake and alert Pain management: pain level controlled Vital Signs Assessment: post-procedure vital signs reviewed and stable Respiratory status: spontaneous breathing, nonlabored ventilation, respiratory function stable and patient connected to nasal cannula oxygen Cardiovascular status: blood pressure returned to baseline and stable Postop Assessment: no apparent nausea or vomiting Anesthetic complications: no   No notable events documented.  Last Vitals:  Vitals:   05/17/21 0128 05/17/21 0531  BP: 106/61 121/69  Pulse: (!) 59 (!) 54  Resp: 16 18  Temp: 36.9 C 37 C  SpO2: 99% 99%    Last Pain:  Vitals:   05/17/21 0630  TempSrc:   PainSc: 0-No pain                 Devony Mcgrady

## 2021-06-04 ENCOUNTER — Other Ambulatory Visit: Payer: Self-pay | Admitting: Family

## 2021-06-04 DIAGNOSIS — E785 Hyperlipidemia, unspecified: Secondary | ICD-10-CM

## 2021-06-17 ENCOUNTER — Other Ambulatory Visit: Payer: Self-pay | Admitting: Family

## 2021-06-17 DIAGNOSIS — G4700 Insomnia, unspecified: Secondary | ICD-10-CM

## 2021-06-27 ENCOUNTER — Ambulatory Visit (INDEPENDENT_AMBULATORY_CARE_PROVIDER_SITE_OTHER): Payer: Medicare HMO | Admitting: Family

## 2021-06-27 ENCOUNTER — Ambulatory Visit
Admission: RE | Admit: 2021-06-27 | Discharge: 2021-06-27 | Disposition: A | Discharge status: Home / Self Care | Payer: Medicare HMO | Source: Ambulatory Visit | Attending: Family | Admitting: Family

## 2021-06-27 ENCOUNTER — Encounter: Payer: Self-pay | Admitting: Family

## 2021-06-27 ENCOUNTER — Other Ambulatory Visit: Payer: Self-pay

## 2021-06-27 VITALS — BP 132/80 | HR 64 | Temp 96.6°F | Ht 72.0 in | Wt 163.2 lb

## 2021-06-27 DIAGNOSIS — M25422 Effusion, left elbow: Secondary | ICD-10-CM | POA: Diagnosis not present

## 2021-06-27 DIAGNOSIS — M25522 Pain in left elbow: Secondary | ICD-10-CM | POA: Diagnosis not present

## 2021-06-27 DIAGNOSIS — M7989 Other specified soft tissue disorders: Secondary | ICD-10-CM

## 2021-06-27 DIAGNOSIS — M25541 Pain in joints of right hand: Secondary | ICD-10-CM

## 2021-06-27 DIAGNOSIS — M79662 Pain in left lower leg: Secondary | ICD-10-CM | POA: Diagnosis not present

## 2021-06-27 DIAGNOSIS — S61011A Laceration without foreign body of right thumb without damage to nail, initial encounter: Secondary | ICD-10-CM

## 2021-06-27 NOTE — Patient Instructions (Addendum)
-   Please get your 2nd COVID-19 vaccine and Shingrix vaccine ( for shingles ) at your pharmacy.   - Please get left elbow and left leg X-ray at [facility] imaging at [facility] then will call you with results.
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
## 2021-06-29 DIAGNOSIS — R3912 Poor urinary stream: Secondary | ICD-10-CM | POA: Diagnosis not present

## 2021-06-29 DIAGNOSIS — R350 Frequency of micturition: Secondary | ICD-10-CM | POA: Diagnosis not present

## 2021-07-04 ENCOUNTER — Ambulatory Visit (INDEPENDENT_AMBULATORY_CARE_PROVIDER_SITE_OTHER): Payer: Medicare HMO

## 2021-07-04 ENCOUNTER — Ambulatory Visit (INDEPENDENT_AMBULATORY_CARE_PROVIDER_SITE_OTHER): Payer: Medicare HMO | Admitting: Orthopedic Surgery

## 2021-07-04 ENCOUNTER — Encounter: Payer: Self-pay | Admitting: Orthopedic Surgery

## 2021-07-04 VITALS — BP 124/71 | HR 56 | Ht 72.0 in | Wt 163.0 lb

## 2021-07-04 DIAGNOSIS — M79641 Pain in right hand: Secondary | ICD-10-CM

## 2021-07-04 DIAGNOSIS — M79644 Pain in right finger(s): Secondary | ICD-10-CM

## 2021-07-04 MED ORDER — LIDOCAINE HCL 1 % IJ SOLN
1.0000 mL | INTRAMUSCULAR | Status: AC | PRN
  Administered 2021-07-04: 1 mL

## 2021-07-04 MED ORDER — BETAMETHASONE SOD PHOS & ACET 6 (3-3) MG/ML IJ SUSP
6.0000 mg | INTRAMUSCULAR | Status: AC | PRN
  Administered 2021-07-04: 6 mg via INTRA_ARTICULAR
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
## 2021-08-03 ENCOUNTER — Inpatient Hospital Stay (HOSPITAL_COMMUNITY)
Admission: EM | Admit: 2021-08-03 | Discharge: 2021-09-08 | DRG: 335 | Disposition: A | Discharge status: Home / Self Care | Payer: Medicare HMO | Attending: Internal Medicine | Admitting: Internal Medicine

## 2021-08-03 ENCOUNTER — Emergency Department (HOSPITAL_COMMUNITY): Payer: Medicare HMO

## 2021-08-03 ENCOUNTER — Encounter (HOSPITAL_COMMUNITY): Payer: Self-pay | Admitting: *Deleted

## 2021-08-03 ENCOUNTER — Other Ambulatory Visit: Payer: Self-pay

## 2021-08-03 DIAGNOSIS — R69 Illness, unspecified: Secondary | ICD-10-CM | POA: Diagnosis not present

## 2021-08-03 DIAGNOSIS — F1011 Alcohol abuse, in remission: Secondary | ICD-10-CM | POA: Diagnosis present

## 2021-08-03 DIAGNOSIS — Z681 Body mass index (BMI) 19 or less, adult: Secondary | ICD-10-CM

## 2021-08-03 DIAGNOSIS — Z79899 Other long term (current) drug therapy: Secondary | ICD-10-CM

## 2021-08-03 DIAGNOSIS — Z79891 Long term (current) use of opiate analgesic: Secondary | ICD-10-CM

## 2021-08-03 DIAGNOSIS — G4700 Insomnia, unspecified: Secondary | ICD-10-CM | POA: Diagnosis present

## 2021-08-03 DIAGNOSIS — F1420 Cocaine dependence, uncomplicated: Secondary | ICD-10-CM

## 2021-08-03 DIAGNOSIS — K56609 Unspecified intestinal obstruction, unspecified as to partial versus complete obstruction: Secondary | ICD-10-CM

## 2021-08-03 DIAGNOSIS — R112 Nausea with vomiting, unspecified: Secondary | ICD-10-CM

## 2021-08-03 DIAGNOSIS — R1030 Lower abdominal pain, unspecified: Secondary | ICD-10-CM

## 2021-08-03 DIAGNOSIS — I428 Other cardiomyopathies: Secondary | ICD-10-CM | POA: Diagnosis present

## 2021-08-03 DIAGNOSIS — Z7982 Long term (current) use of aspirin: Secondary | ICD-10-CM

## 2021-08-03 DIAGNOSIS — K560 Paralytic ileus: Secondary | ICD-10-CM | POA: Diagnosis not present

## 2021-08-03 DIAGNOSIS — F1010 Alcohol abuse, uncomplicated: Secondary | ICD-10-CM | POA: Diagnosis not present

## 2021-08-03 DIAGNOSIS — K529 Noninfective gastroenteritis and colitis, unspecified: Secondary | ICD-10-CM | POA: Diagnosis not present

## 2021-08-03 DIAGNOSIS — R140 Abdominal distension (gaseous): Secondary | ICD-10-CM

## 2021-08-03 DIAGNOSIS — R197 Diarrhea, unspecified: Secondary | ICD-10-CM | POA: Diagnosis not present

## 2021-08-03 DIAGNOSIS — I2510 Atherosclerotic heart disease of native coronary artery without angina pectoris: Secondary | ICD-10-CM | POA: Diagnosis present

## 2021-08-03 DIAGNOSIS — N179 Acute kidney failure, unspecified: Secondary | ICD-10-CM | POA: Diagnosis not present

## 2021-08-03 DIAGNOSIS — E785 Hyperlipidemia, unspecified: Secondary | ICD-10-CM | POA: Diagnosis present

## 2021-08-03 DIAGNOSIS — K565 Intestinal adhesions [bands], unspecified as to partial versus complete obstruction: Secondary | ICD-10-CM | POA: Diagnosis not present

## 2021-08-03 DIAGNOSIS — I700 Atherosclerosis of aorta: Secondary | ICD-10-CM | POA: Diagnosis present

## 2021-08-03 DIAGNOSIS — Z87891 Personal history of nicotine dependence: Secondary | ICD-10-CM

## 2021-08-03 DIAGNOSIS — K9189 Other postprocedural complications and disorders of digestive system: Secondary | ICD-10-CM

## 2021-08-03 DIAGNOSIS — I5022 Chronic systolic (congestive) heart failure: Secondary | ICD-10-CM | POA: Diagnosis not present

## 2021-08-03 DIAGNOSIS — R1032 Left lower quadrant pain: Secondary | ICD-10-CM | POA: Diagnosis not present

## 2021-08-03 DIAGNOSIS — Z9079 Acquired absence of other genital organ(s): Secondary | ICD-10-CM

## 2021-08-03 DIAGNOSIS — E43 Unspecified severe protein-calorie malnutrition: Secondary | ICD-10-CM | POA: Diagnosis present

## 2021-08-03 DIAGNOSIS — J101 Influenza due to other identified influenza virus with other respiratory manifestations: Secondary | ICD-10-CM | POA: Diagnosis present

## 2021-08-03 DIAGNOSIS — I110 Hypertensive heart disease with heart failure: Secondary | ICD-10-CM | POA: Diagnosis present

## 2021-08-03 DIAGNOSIS — Z4659 Encounter for fitting and adjustment of other gastrointestinal appliance and device: Secondary | ICD-10-CM

## 2021-08-03 DIAGNOSIS — R3912 Poor urinary stream: Secondary | ICD-10-CM

## 2021-08-03 DIAGNOSIS — R109 Unspecified abdominal pain: Secondary | ICD-10-CM

## 2021-08-03 DIAGNOSIS — D72819 Decreased white blood cell count, unspecified: Secondary | ICD-10-CM | POA: Diagnosis present

## 2021-08-03 DIAGNOSIS — Z23 Encounter for immunization: Secondary | ICD-10-CM

## 2021-08-03 DIAGNOSIS — J302 Other seasonal allergic rhinitis: Secondary | ICD-10-CM | POA: Diagnosis present

## 2021-08-03 DIAGNOSIS — D6959 Other secondary thrombocytopenia: Secondary | ICD-10-CM | POA: Diagnosis present

## 2021-08-03 DIAGNOSIS — F1421 Cocaine dependence, in remission: Secondary | ICD-10-CM | POA: Diagnosis present

## 2021-08-03 DIAGNOSIS — K567 Ileus, unspecified: Secondary | ICD-10-CM

## 2021-08-03 DIAGNOSIS — Z0189 Encounter for other specified special examinations: Secondary | ICD-10-CM

## 2021-08-03 DIAGNOSIS — N401 Enlarged prostate with lower urinary tract symptoms: Secondary | ICD-10-CM | POA: Diagnosis not present

## 2021-08-03 DIAGNOSIS — Z20822 Contact with and (suspected) exposure to covid-19: Secondary | ICD-10-CM | POA: Diagnosis present

## 2021-08-03 DIAGNOSIS — E860 Dehydration: Secondary | ICD-10-CM | POA: Diagnosis present

## 2021-08-03 DIAGNOSIS — K219 Gastro-esophageal reflux disease without esophagitis: Secondary | ICD-10-CM | POA: Diagnosis present

## 2021-08-03 DIAGNOSIS — J449 Chronic obstructive pulmonary disease, unspecified: Secondary | ICD-10-CM | POA: Diagnosis present

## 2021-08-03 DIAGNOSIS — R1110 Vomiting, unspecified: Secondary | ICD-10-CM

## 2021-08-03 LAB — CBC WITH DIFFERENTIAL/PLATELET
Abs Immature Granulocytes: 0.01 10*3/uL (ref 0.00–0.07)
Basophils Absolute: 0 10*3/uL (ref 0.0–0.1)
Basophils Relative: 0 %
Eosinophils Absolute: 0 10*3/uL (ref 0.0–0.5)
Eosinophils Relative: 0 %
HCT: 42.8 % (ref 39.0–52.0)
Hemoglobin: 14.7 g/dL (ref 13.0–17.0)
Immature Granulocytes: 0 %
Lymphocytes Relative: 20 %
Lymphs Abs: 0.9 10*3/uL (ref 0.7–4.0)
MCH: 32.9 pg (ref 26.0–34.0)
MCHC: 34.3 g/dL (ref 30.0–36.0)
MCV: 95.7 fL (ref 80.0–100.0)
Monocytes Absolute: 0.6 10*3/uL (ref 0.1–1.0)
Monocytes Relative: 12 %
Neutro Abs: 3.2 10*3/uL (ref 1.7–7.7)
Neutrophils Relative %: 68 %
Platelets: 166 10*3/uL (ref 150–400)
RBC: 4.47 MIL/uL (ref 4.22–5.81)
RDW: 12 % (ref 11.5–15.5)
WBC: 4.7 10*3/uL (ref 4.0–10.5)
nRBC: 0 % (ref 0.0–0.2)

## 2021-08-03 LAB — COMPREHENSIVE METABOLIC PANEL
ALT: 17 U/L (ref 0–44)
AST: 32 U/L (ref 15–41)
Albumin: 4.4 g/dL (ref 3.5–5.0)
Alkaline Phosphatase: 42 U/L (ref 38–126)
Anion gap: 10 (ref 5–15)
BUN: 34 mg/dL — ABNORMAL HIGH (ref 8–23)
CO2: 26 mmol/L (ref 22–32)
Calcium: 9.5 mg/dL (ref 8.9–10.3)
Chloride: 102 mmol/L (ref 98–111)
Creatinine, Ser: 1.59 mg/dL — ABNORMAL HIGH (ref 0.61–1.24)
GFR, Estimated: 47 mL/min — ABNORMAL LOW (ref >60)
Glucose, Bld: 110 mg/dL — ABNORMAL HIGH (ref 70–99)
Potassium: 3.9 mmol/L (ref 3.5–5.1)
Sodium: 138 mmol/L (ref 135–145)
Total Bilirubin: 0.8 mg/dL (ref 0.3–1.2)
Total Protein: 7.8 g/dL (ref 6.5–8.1)

## 2021-08-03 LAB — CBC
HCT: 42.8 % (ref 39.0–52.0)
Hemoglobin: 14.3 g/dL (ref 13.0–17.0)
MCH: 32.4 pg (ref 26.0–34.0)
MCHC: 33.4 g/dL (ref 30.0–36.0)
MCV: 97.1 fL (ref 80.0–100.0)
Platelets: 168 10*3/uL (ref 150–400)
RBC: 4.41 MIL/uL (ref 4.22–5.81)
RDW: 12.1 % (ref 11.5–15.5)
WBC: 3.9 10*3/uL — ABNORMAL LOW (ref 4.0–10.5)
nRBC: 0 % (ref 0.0–0.2)

## 2021-08-03 LAB — RESP PANEL BY RT-PCR (FLU A&B, COVID) ARPGX2
Influenza A by PCR: POSITIVE — AB
Influenza B by PCR: NEGATIVE
SARS Coronavirus 2 by RT PCR: NEGATIVE

## 2021-08-03 LAB — C-REACTIVE PROTEIN: CRP: 3.5 mg/dL — ABNORMAL HIGH (ref <1.0)

## 2021-08-03 LAB — CREATININE, SERUM
Creatinine, Ser: 1.31 mg/dL — ABNORMAL HIGH (ref 0.61–1.24)
GFR, Estimated: 59 mL/min — ABNORMAL LOW (ref >60)

## 2021-08-03 LAB — LIPASE, BLOOD: Lipase: 24 U/L (ref 11–51)

## 2021-08-03 LAB — HIV ANTIBODY (ROUTINE TESTING W REFLEX): HIV Screen 4th Generation wRfx: NONREACTIVE

## 2021-08-03 MED ORDER — SODIUM CHLORIDE 0.9 % IV SOLN
2.0000 g | INTRAVENOUS | Status: DC
  Administered 2021-08-03 – 2021-08-05 (×3): 2 g via INTRAVENOUS
  Filled 2021-08-03 (×3): qty 20

## 2021-08-03 MED ORDER — ASPIRIN EC 81 MG PO TBEC
81.0000 mg | DELAYED_RELEASE_TABLET | Freq: Every day | ORAL | Status: DC
  Administered 2021-08-03 – 2021-08-18 (×12): 81 mg via ORAL
  Filled 2021-08-03 (×13): qty 1

## 2021-08-03 MED ORDER — ALBUTEROL SULFATE (2.5 MG/3ML) 0.083% IN NEBU
2.5000 mg | INHALATION_SOLUTION | RESPIRATORY_TRACT | Status: DC | PRN
  Administered 2021-08-07: 2.5 mg via RESPIRATORY_TRACT
  Filled 2021-08-03: qty 3

## 2021-08-03 MED ORDER — ONDANSETRON HCL 4 MG/2ML IJ SOLN
4.0000 mg | Freq: Once | INTRAMUSCULAR | Status: AC
  Administered 2021-08-03: 4 mg via INTRAVENOUS
  Filled 2021-08-03: qty 2

## 2021-08-03 MED ORDER — HYDROCODONE-ACETAMINOPHEN 5-325 MG PO TABS
1.0000 | ORAL_TABLET | ORAL | Status: DC | PRN
  Administered 2021-08-03 – 2021-08-06 (×4): 2 via ORAL
  Filled 2021-08-03 (×5): qty 2

## 2021-08-03 MED ORDER — INFLUENZA VAC A&B SA ADJ QUAD 0.5 ML IM PRSY
0.5000 mL | PREFILLED_SYRINGE | INTRAMUSCULAR | Status: DC
  Filled 2021-08-03: qty 0.5

## 2021-08-03 MED ORDER — HYDRALAZINE HCL 20 MG/ML IJ SOLN: 5.0000 mg | Freq: Four times a day (QID) | INTRAMUSCULAR | Status: DC | PRN

## 2021-08-03 MED ORDER — MORPHINE SULFATE (PF) 2 MG/ML IV SOLN
1.0000 mg | INTRAVENOUS | Status: DC | PRN
  Administered 2021-08-03 – 2021-08-06 (×10): 1 mg via INTRAVENOUS
  Filled 2021-08-03 (×10): qty 1

## 2021-08-03 MED ORDER — SODIUM CHLORIDE 0.9 % IV SOLN: INTRAVENOUS | Status: AC

## 2021-08-03 MED ORDER — ENOXAPARIN SODIUM 40 MG/0.4ML IJ SOSY
40.0000 mg | PREFILLED_SYRINGE | INTRAMUSCULAR | Status: DC
  Administered 2021-08-03 – 2021-09-07 (×36): 40 mg via SUBCUTANEOUS
  Filled 2021-08-03 (×35): qty 0.4

## 2021-08-03 MED ORDER — ACETAMINOPHEN 650 MG RE SUPP: 650.0000 mg | Freq: Four times a day (QID) | RECTAL | Status: DC | PRN

## 2021-08-03 MED ORDER — ACETAMINOPHEN 325 MG PO TABS: 650.0000 mg | ORAL_TABLET | Freq: Four times a day (QID) | ORAL | Status: DC | PRN

## 2021-08-03 MED ORDER — LISINOPRIL 20 MG PO TABS: 20.0000 mg | ORAL_TABLET | Freq: Every day | ORAL | Status: DC

## 2021-08-03 MED ORDER — TRAZODONE HCL 100 MG PO TABS
100.0000 mg | ORAL_TABLET | Freq: Every evening | ORAL | Status: DC | PRN
  Administered 2021-08-08 – 2021-08-19 (×4): 100 mg via ORAL
  Filled 2021-08-03 (×4): qty 1

## 2021-08-03 MED ORDER — IOHEXOL 300 MG/ML  SOLN
100.0000 mL | Freq: Once | INTRAMUSCULAR | Status: AC | PRN
  Administered 2021-08-03: 100 mL via INTRAVENOUS

## 2021-08-03 MED ORDER — ROSUVASTATIN CALCIUM 5 MG PO TABS
5.0000 mg | ORAL_TABLET | Freq: Every day | ORAL | Status: DC
  Administered 2021-08-03 – 2021-08-18 (×12): 5 mg via ORAL
  Filled 2021-08-03 (×13): qty 1

## 2021-08-03 MED ORDER — SODIUM CHLORIDE 0.9 % IV SOLN: INTRAVENOUS | Status: DC

## 2021-08-03 MED ORDER — MORPHINE SULFATE (PF) 4 MG/ML IV SOLN
4.0000 mg | Freq: Once | INTRAVENOUS | Status: AC
  Administered 2021-08-03: 4 mg via INTRAVENOUS
  Filled 2021-08-03: qty 1

## 2021-08-03 MED ORDER — METRONIDAZOLE 500 MG/100ML IV SOLN
500.0000 mg | Freq: Two times a day (BID) | INTRAVENOUS | Status: DC
  Administered 2021-08-03 – 2021-08-06 (×6): 500 mg via INTRAVENOUS
  Filled 2021-08-03 (×6): qty 100

## 2021-08-03 MED ORDER — ONDANSETRON HCL 4 MG/2ML IJ SOLN
4.0000 mg | Freq: Four times a day (QID) | INTRAMUSCULAR | Status: DC | PRN
  Administered 2021-08-04 – 2021-08-20 (×11): 4 mg via INTRAVENOUS
  Filled 2021-08-03 (×12): qty 2

## 2021-08-03 MED ORDER — ONDANSETRON HCL 4 MG PO TABS: 4.0000 mg | ORAL_TABLET | Freq: Four times a day (QID) | ORAL | Status: DC | PRN

## 2021-08-03 MED ORDER — LACTATED RINGERS IV SOLN: INTRAVENOUS | Status: DC
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
## 2021-08-03 NOTE — ED Triage Notes (Signed)
C/o sore throat and abd. Pain onset Wed. , c/o sharp pain in left lower abd.
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
## 2021-08-03 NOTE — Progress Notes (Signed)
Patient has a business card in his wallet that he handed to me that has the information of where he lives.   [facility] [address] [place] [address]  [phone] Fax [phone] [email or web address]
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
## 2021-08-04 ENCOUNTER — Observation Stay (HOSPITAL_COMMUNITY): Payer: Medicare HMO

## 2021-08-04 ENCOUNTER — Inpatient Hospital Stay (HOSPITAL_COMMUNITY): Payer: Medicare HMO

## 2021-08-04 DIAGNOSIS — R112 Nausea with vomiting, unspecified: Secondary | ICD-10-CM | POA: Diagnosis not present

## 2021-08-04 DIAGNOSIS — N179 Acute kidney failure, unspecified: Secondary | ICD-10-CM | POA: Diagnosis not present

## 2021-08-04 DIAGNOSIS — K9189 Other postprocedural complications and disorders of digestive system: Secondary | ICD-10-CM | POA: Diagnosis not present

## 2021-08-04 DIAGNOSIS — N281 Cyst of kidney, acquired: Secondary | ICD-10-CM | POA: Diagnosis not present

## 2021-08-04 DIAGNOSIS — K6389 Other specified diseases of intestine: Secondary | ICD-10-CM | POA: Diagnosis not present

## 2021-08-04 DIAGNOSIS — K565 Intestinal adhesions [bands], unspecified as to partial versus complete obstruction: Secondary | ICD-10-CM | POA: Diagnosis not present

## 2021-08-04 DIAGNOSIS — D72819 Decreased white blood cell count, unspecified: Secondary | ICD-10-CM | POA: Diagnosis present

## 2021-08-04 DIAGNOSIS — F1421 Cocaine dependence, in remission: Secondary | ICD-10-CM | POA: Diagnosis present

## 2021-08-04 DIAGNOSIS — M25551 Pain in right hip: Secondary | ICD-10-CM | POA: Diagnosis not present

## 2021-08-04 DIAGNOSIS — I428 Other cardiomyopathies: Secondary | ICD-10-CM | POA: Diagnosis not present

## 2021-08-04 DIAGNOSIS — R3912 Poor urinary stream: Secondary | ICD-10-CM | POA: Diagnosis not present

## 2021-08-04 DIAGNOSIS — Z20822 Contact with and (suspected) exposure to covid-19: Secondary | ICD-10-CM | POA: Diagnosis not present

## 2021-08-04 DIAGNOSIS — I110 Hypertensive heart disease with heart failure: Secondary | ICD-10-CM | POA: Diagnosis not present

## 2021-08-04 DIAGNOSIS — Z4682 Encounter for fitting and adjustment of non-vascular catheter: Secondary | ICD-10-CM | POA: Diagnosis not present

## 2021-08-04 DIAGNOSIS — K3189 Other diseases of stomach and duodenum: Secondary | ICD-10-CM | POA: Diagnosis not present

## 2021-08-04 DIAGNOSIS — R197 Diarrhea, unspecified: Secondary | ICD-10-CM | POA: Diagnosis not present

## 2021-08-04 DIAGNOSIS — Z452 Encounter for adjustment and management of vascular access device: Secondary | ICD-10-CM | POA: Diagnosis not present

## 2021-08-04 DIAGNOSIS — D6959 Other secondary thrombocytopenia: Secondary | ICD-10-CM | POA: Diagnosis not present

## 2021-08-04 DIAGNOSIS — R69 Illness, unspecified: Secondary | ICD-10-CM | POA: Diagnosis not present

## 2021-08-04 DIAGNOSIS — J302 Other seasonal allergic rhinitis: Secondary | ICD-10-CM | POA: Diagnosis not present

## 2021-08-04 DIAGNOSIS — K219 Gastro-esophageal reflux disease without esophagitis: Secondary | ICD-10-CM | POA: Diagnosis present

## 2021-08-04 DIAGNOSIS — I700 Atherosclerosis of aorta: Secondary | ICD-10-CM | POA: Diagnosis present

## 2021-08-04 DIAGNOSIS — E860 Dehydration: Secondary | ICD-10-CM | POA: Diagnosis present

## 2021-08-04 DIAGNOSIS — G4700 Insomnia, unspecified: Secondary | ICD-10-CM | POA: Diagnosis present

## 2021-08-04 DIAGNOSIS — K7689 Other specified diseases of liver: Secondary | ICD-10-CM | POA: Diagnosis not present

## 2021-08-04 DIAGNOSIS — K5939 Other megacolon: Secondary | ICD-10-CM | POA: Diagnosis not present

## 2021-08-04 DIAGNOSIS — F1010 Alcohol abuse, uncomplicated: Secondary | ICD-10-CM | POA: Diagnosis not present

## 2021-08-04 DIAGNOSIS — K560 Paralytic ileus: Secondary | ICD-10-CM | POA: Diagnosis not present

## 2021-08-04 DIAGNOSIS — Z9889 Other specified postprocedural states: Secondary | ICD-10-CM | POA: Diagnosis not present

## 2021-08-04 DIAGNOSIS — J449 Chronic obstructive pulmonary disease, unspecified: Secondary | ICD-10-CM | POA: Diagnosis present

## 2021-08-04 DIAGNOSIS — E785 Hyperlipidemia, unspecified: Secondary | ICD-10-CM | POA: Diagnosis present

## 2021-08-04 DIAGNOSIS — R109 Unspecified abdominal pain: Secondary | ICD-10-CM | POA: Diagnosis not present

## 2021-08-04 DIAGNOSIS — Z681 Body mass index (BMI) 19 or less, adult: Secondary | ICD-10-CM | POA: Diagnosis not present

## 2021-08-04 DIAGNOSIS — G911 Obstructive hydrocephalus: Secondary | ICD-10-CM | POA: Diagnosis not present

## 2021-08-04 DIAGNOSIS — R140 Abdominal distension (gaseous): Secondary | ICD-10-CM | POA: Diagnosis not present

## 2021-08-04 DIAGNOSIS — K56609 Unspecified intestinal obstruction, unspecified as to partial versus complete obstruction: Secondary | ICD-10-CM | POA: Diagnosis not present

## 2021-08-04 DIAGNOSIS — K5651 Intestinal adhesions [bands], with partial obstruction: Secondary | ICD-10-CM | POA: Diagnosis not present

## 2021-08-04 DIAGNOSIS — J101 Influenza due to other identified influenza virus with other respiratory manifestations: Secondary | ICD-10-CM | POA: Diagnosis not present

## 2021-08-04 DIAGNOSIS — E43 Unspecified severe protein-calorie malnutrition: Secondary | ICD-10-CM | POA: Diagnosis not present

## 2021-08-04 DIAGNOSIS — K567 Ileus, unspecified: Secondary | ICD-10-CM | POA: Diagnosis not present

## 2021-08-04 DIAGNOSIS — N401 Enlarged prostate with lower urinary tract symptoms: Secondary | ICD-10-CM | POA: Diagnosis not present

## 2021-08-04 DIAGNOSIS — I5022 Chronic systolic (congestive) heart failure: Secondary | ICD-10-CM | POA: Diagnosis not present

## 2021-08-04 DIAGNOSIS — F1011 Alcohol abuse, in remission: Secondary | ICD-10-CM | POA: Diagnosis present

## 2021-08-04 DIAGNOSIS — R1032 Left lower quadrant pain: Secondary | ICD-10-CM | POA: Diagnosis not present

## 2021-08-04 DIAGNOSIS — K529 Noninfective gastroenteritis and colitis, unspecified: Secondary | ICD-10-CM | POA: Diagnosis not present

## 2021-08-04 DIAGNOSIS — Z23 Encounter for immunization: Secondary | ICD-10-CM | POA: Diagnosis not present

## 2021-08-04 DIAGNOSIS — K5989 Other specified functional intestinal disorders: Secondary | ICD-10-CM | POA: Diagnosis not present

## 2021-08-04 DIAGNOSIS — R1030 Lower abdominal pain, unspecified: Secondary | ICD-10-CM | POA: Diagnosis not present

## 2021-08-04 LAB — COMPREHENSIVE METABOLIC PANEL
ALT: 17 U/L (ref 0–44)
AST: 30 U/L (ref 15–41)
Albumin: 3.6 g/dL (ref 3.5–5.0)
Alkaline Phosphatase: 34 U/L — ABNORMAL LOW (ref 38–126)
Anion gap: 11 (ref 5–15)
BUN: 34 mg/dL — ABNORMAL HIGH (ref 8–23)
CO2: 24 mmol/L (ref 22–32)
Calcium: 8.8 mg/dL — ABNORMAL LOW (ref 8.9–10.3)
Chloride: 103 mmol/L (ref 98–111)
Creatinine, Ser: 1.35 mg/dL — ABNORMAL HIGH (ref 0.61–1.24)
GFR, Estimated: 57 mL/min — ABNORMAL LOW (ref >60)
Glucose, Bld: 94 mg/dL (ref 70–99)
Potassium: 3.8 mmol/L (ref 3.5–5.1)
Sodium: 138 mmol/L (ref 135–145)
Total Bilirubin: 0.6 mg/dL (ref 0.3–1.2)
Total Protein: 6.8 g/dL (ref 6.5–8.1)

## 2021-08-04 LAB — CBC
HCT: 42.8 % (ref 39.0–52.0)
Hemoglobin: 13.9 g/dL (ref 13.0–17.0)
MCH: 32.3 pg (ref 26.0–34.0)
MCHC: 32.5 g/dL (ref 30.0–36.0)
MCV: 99.3 fL (ref 80.0–100.0)
Platelets: 157 10*3/uL (ref 150–400)
RBC: 4.31 MIL/uL (ref 4.22–5.81)
RDW: 12 % (ref 11.5–15.5)
WBC: 4 10*3/uL (ref 4.0–10.5)
nRBC: 0 % (ref 0.0–0.2)

## 2021-08-04 LAB — GLUCOSE, CAPILLARY: Glucose-Capillary: 227 mg/dL — ABNORMAL HIGH (ref 70–99)

## 2021-08-04 MED ORDER — LACTATED RINGERS IV SOLN: INTRAVENOUS | Status: AC

## 2021-08-04 MED ORDER — OSELTAMIVIR PHOSPHATE 75 MG PO CAPS
75.0000 mg | ORAL_CAPSULE | Freq: Once | ORAL | Status: AC
  Administered 2021-08-04: 75 mg via ORAL
  Filled 2021-08-04: qty 1

## 2021-08-04 MED ORDER — DIATRIZOATE MEGLUMINE & SODIUM 66-10 % PO SOLN
90.0000 mL | Freq: Once | ORAL | Status: AC
  Administered 2021-08-04: 90 mL via ORAL
  Filled 2021-08-04: qty 90

## 2021-08-04 MED ORDER — OSELTAMIVIR PHOSPHATE 30 MG PO CAPS
30.0000 mg | ORAL_CAPSULE | Freq: Two times a day (BID) | ORAL | Status: AC
  Administered 2021-08-05 – 2021-08-09 (×4): 30 mg via ORAL
  Filled 2021-08-04 (×9): qty 1

## 2021-08-04 NOTE — Evaluation (Signed)
Physical Therapy Evaluation Patient Details Name: [name] MRN: [id number] DOB: November 05, 1950 Today's Date: 08/04/2021  History of Present Illness  Patient is a 70 y/o male who presents on 08/03/21 due to abdominal pain and diarrhea. CT abdomen/pelvis- dilated fluid and air-filled small intestine consistent with small bowel obstruction. Per surgery, ilieus vs enteritis vs partial SBO.  PMH includes HTN, chronic systolic CHF- EF 65%, prior history of alcoholism  Clinical Impression  Patient presents with pain, generalized weakness, impaired balance and impaired mobility s/p above. Pt lives in a motel with his girlfriend and normally is very active, working. Today, pt tolerated transfers and gait training with Min guard-Min A and use of RW for support. Noted to have hx of falls. At this time recommending pt use RW for safety. Encouraged OOB and walking a few times daily with nursing/mobility tech. Pt reports pain has not improved since admission. Hoping pt will progress well once pain is better. WIll follow acutely to maximize independence and mobility prior to return home.        Recommendations for follow up therapy are one component of a multi-disciplinary discharge planning process, led by the attending physician.  Recommendations may be updated based on patient status, additional functional criteria and insurance authorization.  Follow Up Recommendations No PT follow up    Assistance Recommended at Discharge Intermittent Supervision/Assistance  Functional Status Assessment Patient has had a recent decline in their functional status and demonstrates the ability to make significant improvements in function in a reasonable and predictable amount of time.  Equipment Recommendations  Rolling walker (2 wheels) (pending improvement)    Recommendations for Other Services       Precautions / Restrictions Precautions Precautions: Fall Restrictions Weight Bearing Restrictions: No       Mobility  Bed Mobility Overal bed mobility: Modified Independent             General bed mobility comments: No assist needed, HOB elevated, use of rails.    Transfers Overall transfer level: Needs assistance Equipment used: None Transfers: Sit to/from Stand Sit to Stand: Min guard           General transfer comment: Min guard for safety. Stood from EOB x1, mildly unsteady once upright asking to use RW.    Ambulation/Gait Ambulation/Gait assistance: Min guard;Min assist Gait Distance (Feet): 150 Feet Assistive device: Rolling walker (2 wheels) Gait Pattern/deviations: Step-through pattern;Decreased stride length Gait velocity: decreased Gait velocity interpretation: 1.31 - 2.62 ft/sec, indicative of limited community ambulator General Gait Details: Slow, mostly steady gait with a few instances of unsteadiness needing Min A for support. Cues for RW management.  Stairs            Wheelchair Mobility    Modified Rankin (Stroke Patients Only)       Balance Overall balance assessment: Needs assistance;History of Falls Sitting-balance support: Feet supported;No upper extremity supported Sitting balance-Leahy Scale: Good     Standing balance support: During functional activity Standing balance-Leahy Scale: Fair Standing balance comment: Able to stand statically without UE support bur does better with UE support at this time.                             Pertinent Vitals/Pain Pain Assessment: 0-10 Pain Score: 10-Worst pain ever Pain Location: abdomen Pain Descriptors / Indicators: Sore;Aching Pain Intervention(s): Monitored during session    Home Living Family/patient expects to be discharged to:: Private residence Living Arrangements: Spouse/significant other (  g/f) Available Help at Discharge: Family;Available PRN/intermittently Type of Home: Other(Comment) (motel) Home Access: Level entry       Home Layout: One level Home Equipment:  Cane - single point      Prior Function Prior Level of Function : Independent/Modified Independent             Mobility Comments: Works, does not drive. Reports falls in the shower and at work backwards. ADLs Comments: Does own ADLs, g/f does IADLs.     Hand Dominance   Dominant Hand: Right    Extremity/Trunk Assessment   Upper Extremity Assessment Upper Extremity Assessment: Defer to OT evaluation    Lower Extremity Assessment Lower Extremity Assessment: Generalized weakness    Cervical / Trunk Assessment Cervical / Trunk Assessment: Normal  Communication   Communication: Expressive difficulties  Cognition Arousal/Alertness: Awake/alert Behavior During Therapy: WFL for tasks assessed/performed Overall Cognitive Status: Difficult to assess                                 General Comments: Mumbled speech, difficult to understand at times. Follows commands. Poor recall of falls/awareness of deficits.        General Comments General comments (skin integrity, edema, etc.): Aunt present in room during session. Providing some history and filling in gaps related to questioning about PLOF/falls etc    Exercises     Assessment/Plan    PT Assessment Patient needs continued PT services  PT Problem List Decreased strength;Decreased mobility;Decreased safety awareness;Pain;Decreased balance;Decreased knowledge of use of DME;Decreased activity tolerance       PT Treatment Interventions Therapeutic activities;Gait training;Functional mobility training;Balance training;Therapeutic exercise;Patient/family education;DME instruction    PT Goals (Current goals can be found in the Care Plan section)  Acute Rehab PT Goals Patient Stated Goal: improve pain, get back to work PT Goal Formulation: With patient Time For Goal Achievement: 08/18/21 Potential to Achieve Goals: Good    Frequency Min 3X/week   Barriers to discharge        Co-evaluation                AM-PAC PT "6 Clicks" Mobility  Outcome Measure Help needed turning from your back to your side while in a flat bed without using bedrails?: None Help needed moving from lying on your back to sitting on the side of a flat bed without using bedrails?: None Help needed moving to and from a bed to a chair (including a wheelchair)?: A Little Help needed standing up from a chair using your arms (e.g., wheelchair or bedside chair)?: A Little Help needed to walk in hospital room?: A Little Help needed climbing 3-5 steps with a railing? : A Little 6 Click Score: 20    End of Session Equipment Utilized During Treatment: Gait belt Activity Tolerance: Patient tolerated treatment well;Patient limited by pain Patient left: in bed;with call bell/phone within reach;with bed alarm set;with family/visitor present Nurse Communication: Mobility status PT Visit Diagnosis: Pain;Muscle weakness (generalized) (M62.81) Pain - part of body:  (abdomen)    Time: 1330-1350 PT Time Calculation (min) (ACUTE ONLY): 20 min   Charges:   PT Evaluation $PT Eval Moderate Complexity: 1 Mod          Vale Haven, PT, DPT Acute Rehabilitation Services Pager [phone] Office [phone]     Blake Divine A Lanier Ensign 08/04/2021, 3:42 PM

## 2021-08-04 NOTE — Progress Notes (Addendum)
PROGRESS NOTE  [name] FIE:[id number] DOB: [birth year] DOA: 08/03/2021 PCP: Sharon Seller, NP  HPI/Recap of past 24 hours: [name] is a 70 y.o. male with h/o hypertension, chronic systolic CHF- EF 16-60%, GERD, BPH, prior history of alcoholism presents with complaints of lower abdominal pain.  Work-up revealed influenza A infection and small bowel obstruction seen on CT abdomen and pelvis.  General surgery consulted, felt CT findings likely representative of enteritis and possibly ileus.  They recommended medical admission and supportive management.  Patient was started on Tamiflu, Rocephin and IV Flagyl.   08/04/2021: Patient seen and examined at his bedside, his aunt was present in the room.  He denies flatus.  Continues to have diffuse abdominal pain.  Denies nausea, on clear liquid diet.  Assessment/Plan: Principal Problem:   Enteritis Active Problems:   Alcohol abuse   Diarrhea   Chronic systolic CHF (congestive heart failure) (HCC)   Cocaine use disorder, moderate, dependence (HCC)   Benign prostatic hyperplasia with weak urinary stream   AKI (acute kidney injury) (HCC)  Acute enteritis with associated ileus Per general surgery's clinical presentation not consistent with bowel obstruction Continue Rocephin and IV Flagyl Optimize magnesium and potassium levels Continue IV fluid hydration Mobilize as tolerated Appreciate general surgery assistance Monitor fever curve and WBC  Influenza A viral infection Start Tamiflu 30 mg twice daily x5 days Bronchodilators as needed  AKI, suspect prerenal in setting of poor oral intake Baseline creatinine appears to be 0.75 with GFR greater than 60 Presented with creatinine of 1.59 with GFR 47 Creatinine is downtrending Continue to avoid nephrotoxic agents, dehydration and hypotension. Continue IV fluid hydration Nexa monitor urine output.  Hyperlipidemia Resume home Crestor  Generalized weakness/physical  debility PT OT to assess Fall precautions  Critical care time: 65 minutes.   Code Status: Full code  Family Communication: aunt at bedside  Disposition Plan: Likely will discharge to home once ileus has resolved.   Consultants: General surgery  Procedures: None  Antimicrobials: Rocephin/ IV Flagyl  DVT prophylaxis: Subcutaneous Lovenox daily  Status is: Inpatient  Inpatient status.  Patient will require at least 2 midnights for further evaluation and treatment of present condition.      Objective: Vitals:   08/04/21 0025 08/04/21 0503 08/04/21 0801 08/04/21 1604  BP: 114/66 108/67 98/64 121/77  Pulse: 68 63 63 63  Resp: 17 17 18 18   Temp: 98.5 F (36.9 C) 98.5 F (36.9 C) 98.2 F (36.8 C) 98.9 F (37.2 C)  TempSrc: Oral Oral Oral Oral  SpO2: 100% 97% 98% 100%  Weight:      Height:        Intake/Output Summary (Last 24 hours) at 08/04/2021 1736 Last data filed at 08/04/2021 1613 Gross per 24 hour  Intake 2332.75 ml  Output --  Net 2332.75 ml   Filed Weights   08/03/21 1038  Weight: 81.6 kg    Exam:  General: 70 y.o. year-old male well developed well nourished in no acute distress.  Alert and interactive. Cardiovascular: Regular rate and rhythm with no rubs or gallops.  No thyromegaly or JVD noted.   Respiratory: Faint rales at bases. Good inspiratory effort. Abdomen: Mildly tender diffusely with hypoactive bowel sounds. Musculoskeletal: No lower extremity edema. 2/4 pulses in all 4 extremities. Skin: No ulcerative lesions noted or rashes, Psychiatry: Mood is appropriate for condition and setting Neuro: Alert and awake.   Data Reviewed: CBC: Recent Labs  Lab 08/03/21 1045 08/03/21 1707 08/04/21 0214  WBC 4.7 3.9* 4.0  NEUTROABS 3.2  --   --   HGB 14.7 14.3 13.9  HCT 42.8 42.8 42.8  MCV 95.7 97.1 99.3  PLT 166 168 157   Basic Metabolic Panel: Recent Labs  Lab 08/03/21 1045 08/03/21 1707 08/04/21 0214  NA 138  --  138  K 3.9   --  3.8  CL 102  --  103  CO2 26  --  24  GLUCOSE 110*  --  94  BUN 34*  --  34*  CREATININE 1.59* 1.31* 1.35*  CALCIUM 9.5  --  8.8*   GFR: Estimated Creatinine Clearance: 56.7 mL/min (A) (by C-G formula based on SCr of 1.35 mg/dL (H)). Liver Function Tests: Recent Labs  Lab 08/03/21 1045 08/04/21 0214  AST 32 30  ALT 17 17  ALKPHOS 42 34*  BILITOT 0.8 0.6  PROT 7.8 6.8  ALBUMIN 4.4 3.6   Recent Labs  Lab 08/03/21 1045  LIPASE 24   No results for input(s): AMMONIA in the last 168 hours. Coagulation Profile: No results for input(s): INR, PROTIME in the last 168 hours. Cardiac Enzymes: No results for input(s): CKTOTAL, CKMB, CKMBINDEX, TROPONINI in the last 168 hours. BNP (last 3 results) No results for input(s): PROBNP in the last 8760 hours. HbA1C: No results for input(s): HGBA1C in the last 72 hours. CBG: No results for input(s): GLUCAP in the last 168 hours. Lipid Profile: No results for input(s): CHOL, HDL, LDLCALC, TRIG, CHOLHDL, LDLDIRECT in the last 72 hours. Thyroid Function Tests: No results for input(s): TSH, T4TOTAL, FREET4, T3FREE, THYROIDAB in the last 72 hours. Anemia Panel: No results for input(s): VITAMINB12, FOLATE, FERRITIN, TIBC, IRON, RETICCTPCT in the last 72 hours. Urine analysis:    Component Value Date/Time   COLORURINE YELLOW 12/06/2007 2258   APPEARANCEUR Cloudy (A) 10/28/2015 1615   LABSPEC 1.020 05/08/2011 1016   PHURINE 5.5 05/08/2011 1016   GLUCOSEU Negative 10/28/2015 1615   HGBUR SMALL (A) 05/08/2011 1016   BILIRUBINUR Negative 10/28/2015 1615   KETONESUR NEGATIVE 05/08/2011 1016   PROTEINUR Trace 10/28/2015 1615   PROTEINUR NEGATIVE 05/08/2011 1016   UROBILINOGEN 0.2 05/08/2011 1016   NITRITE Positive (A) 10/28/2015 1615   NITRITE NEGATIVE 05/08/2011 1016   LEUKOCYTESUR Negative 10/28/2015 1615   Sepsis Labs: @LABRCNTIP (procalcitonin:4,lacticidven:4)  ) Recent Results (from the past 240 hour(s))  Resp Panel by RT-PCR  (Flu A&B, Covid) Nasopharyngeal Swab     Status: Abnormal   Collection Time: 08/03/21 12:31 PM   Specimen: Nasopharyngeal Swab; Nasopharyngeal(NP) swabs in vial transport medium  Result Value Ref Range Status   SARS Coronavirus 2 by RT PCR NEGATIVE NEGATIVE Final    Comment: (NOTE) SARS-CoV-2 target nucleic acids are NOT DETECTED.  The SARS-CoV-2 RNA is generally detectable in upper respiratory specimens during the acute phase of infection. The lowest concentration of SARS-CoV-2 viral copies this assay can detect is 138 copies/mL. A negative result does not preclude SARS-Cov-2 infection and should not be used as the sole basis for treatment or other patient management decisions. A negative result may occur with  improper specimen collection/handling, submission of specimen other than nasopharyngeal swab, presence of viral mutation(s) within the areas targeted by this assay, and inadequate number of viral copies(<138 copies/mL). A negative result must be combined with clinical observations, patient history, and epidemiological information. The expected result is Negative.  Fact Sheet for Patients:  13/03/22  Fact Sheet for Healthcare Providers:  BloggerCourse.com  This test is no t yet approved  or cleared by the [place] and  has been authorized for detection and/or diagnosis of SARS-CoV-2 by FDA under an Emergency Use Authorization (EUA). This EUA will remain  in effect (meaning this test can be used) for the duration of the COVID-19 declaration under Section 564(b)(1) of the Act, 21 U.S.C.section 360bbb-3(b)(1), unless the authorization is terminated  or revoked sooner.       Influenza A by PCR POSITIVE (A) NEGATIVE Final   Influenza B by PCR NEGATIVE NEGATIVE Final    Comment: (NOTE) The Xpert Xpress SARS-CoV-2/FLU/RSV plus assay is intended as an aid in the diagnosis of influenza from Nasopharyngeal swab  specimens and should not be used as a sole basis for treatment. Nasal washings and aspirates are unacceptable for Xpert Xpress SARS-CoV-2/FLU/RSV testing.  Fact Sheet for Patients: BloggerCourse.com  Fact Sheet for Healthcare Providers: SeriousBroker.it  This test is not yet approved or cleared by the [name] FDA and has been authorized for detection and/or diagnosis of SARS-CoV-2 by FDA under an Emergency Use Authorization (EUA). This EUA will remain in effect (meaning this test can be used) for the duration of the COVID-19 declaration under Section 564(b)(1) of the Act, 21 U.S.C. section 360bbb-3(b)(1), unless the authorization is terminated or revoked.  Performed at [facility], [address] [place], [place] [address]       Studies: DG Abd Portable 1V  Result Date: 08/04/2021 CLINICAL DATA:  Abdominal pain EXAM: PORTABLE ABDOMEN - 1 VIEW COMPARISON:  CT 1 day prior FINDINGS: 2 supine views. Right hemidiaphragm elevation. Proximal and mid small bowel dilatation including at up to 4.2 cm, similar to the prior CT scout film. Normal caliber rectal gas. No gross free intraperitoneal air. Contrast within the urinary bladder. IMPRESSION: Similar partial small bowel obstruction. Electronically Signed   By: Jeronimo Greaves M.D.   On: 08/04/2021 10:43    Scheduled Meds:  aspirin EC  81 mg Oral Daily   enoxaparin (LOVENOX) injection  40 mg Subcutaneous Q24H   influenza vaccine adjuvanted  0.5 mL Intramuscular Tomorrow-1000   oseltamivir  30 mg Oral BID   rosuvastatin  5 mg Oral Daily    Continuous Infusions:  cefTRIAXone (ROCEPHIN)  IV 2 g (08/04/21 1644)   lactated ringers 50 mL/hr at 08/04/21 1530   metronidazole 500 mg (08/04/21 0320)     LOS: 0 days     Darlin Drop, MD [facility] Pager [phone]  If 7PM-7AM, please contact night-coverage [email or web address] Password [facility] 08/04/2021, 5:36 PM

## 2021-08-04 NOTE — Progress Notes (Signed)
Subjective/Chief Complaint: ABDOMINAL PAIN Pt with diffuse crampy abdominal pain no BM    Objective: Vital signs in last 24 hours: Temp:  [98.1 F (36.7 C)-98.5 F (36.9 C)] 98.2 F (36.8 C) (11/04 0801) Pulse Rate:  [63-87] 63 (11/04 0801) Resp:  [15-19] 18 (11/04 0801) BP: (98-119)/(64-79) 98/64 (11/04 0801) SpO2:  [97 %-100 %] 98 % (11/04 0801) Weight:  [81.6 kg] 81.6 kg (11/03 1038) Last BM Date: 08/03/21  Intake/Output from previous day: 11/03 0701 - 11/04 0700 In: 1242.8 [P.O.:120; I.V.:922.8; IV Piggyback:200] Out: -  Intake/Output this shift: Total I/O In: 240 [P.O.:240] Out: -   General appearance: alert and cooperative Resp: clear to auscultation bilaterally Cardio: regular rate and rhythm, S1, S2 normal, no murmur, click, rub or gallop GI: soft mid distention diffusely tender without peritonitis  Neurologic: Grossly normal  Lab Results:  Recent Labs    08/03/21 1707 08/04/21 0214  WBC 3.9* 4.0  HGB 14.3 13.9  HCT 42.8 42.8  PLT 168 157   BMET Recent Labs    08/03/21 1045 08/03/21 1707 08/04/21 0214  NA 138  --  138  K 3.9  --  3.8  CL 102  --  103  CO2 26  --  24  GLUCOSE 110*  --  94  BUN 34*  --  34*  CREATININE 1.59* 1.31* 1.35*  CALCIUM 9.5  --  8.8*   PT/INR No results for input(s): LABPROT, INR in the last 72 hours. ABG No results for input(s): PHART, HCO3 in the last 72 hours.  Invalid input(s): PCO2, PO2  Studies/Results: CT ABDOMEN PELVIS W CONTRAST  Result Date: 08/03/2021 CLINICAL DATA:  Right lower quadrant abdominal pain. Appendicitis suspected. Duration of symptoms 3 days. EXAM: CT ABDOMEN AND PELVIS WITH CONTRAST TECHNIQUE: Multidetector CT imaging of the abdomen and pelvis was performed using the standard protocol following bolus administration of intravenous contrast. CONTRAST:  OMNIPAQUE IOHEXOL 300 MG/ML  SOLN COMPARISON:  02/21/2021 FINDINGS: Lower chest: Lung bases are clear except for minimal scarring.  Hepatobiliary: Multiple liver cysts as seen previously. No acute liver pathology. No calcified gallstones. Pancreas: Normal Right lower quadrant abdominal pain. Appendicitis suspected. Duration of symptoms 3 days. Spleen: Normal Adrenals/Urinary Tract: Adrenal glands are normal. Kidneys are normal except for a few tiny cysts. Bladder is normal. Stomach/Bowel: The stomach is normal. There is dilated fluid and air-filled small intestine there is a transition zone in the mid to distal ileum. This is consistent with small bowel obstruction. Precise etiology not established. The colon is largely collapsed. I think the appendix is normal. Vascular/Lymphatic: Mild aortic atherosclerosis. No aneurysm. IVC is normal. No retroperitoneal adenopathy. Reproductive: Normal Other: No free fluid or air. Musculoskeletal: Ordinary lumbar degenerative changes. IMPRESSION: Small-bowel obstruction, probably in the mid ileal region. Etiology not clearly established. Multiple hepatic cysts and renal cysts as seen previously. Aortic Atherosclerosis (ICD10-I70.0). Electronically Signed   By: Paulina Fusi M.D.   On: 08/03/2021 13:01    Anti-infectives: Anti-infectives (From admission, onward)    Start     Dose/Rate Route Frequency Ordered Stop   08/04/21 2200  oseltamivir (TAMIFLU) capsule 30 mg        30 mg Oral 2 times daily 08/04/21 0822 08/09/21 0959   08/04/21 1000  oseltamivir (TAMIFLU) capsule 75 mg        75 mg Oral  Once 08/04/21 0825     08/03/21 1645  cefTRIAXone (ROCEPHIN) 2 g in sodium chloride 0.9 % 100 mL IVPB  2 g 200 mL/hr over 30 Minutes Intravenous Every 24 hours 08/03/21 1631     08/03/21 1645  metroNIDAZOLE (FLAGYL) IVPB 500 mg        500 mg 100 mL/hr over 60 Minutes Intravenous Every 12 hours 08/03/21 1631         Assessment/Plan: Abdominal pain and diarrhea ?Partial SBO vs ileus vs enteritis - not much change over 24 hours  WBC normal and symptoms similar to yesterday Check KUB today   Flu positive but no cough etc   Non acute exam but will continue to follow  May need SBFT at some point    ID - none VTE - SCDs, ok for chemical DVT prophylaxis from surgical standpoint FEN - IVF, CLD Foley - none   AKI HTN HLD COPD CHF    LOS: 0 days    Maisie Fus A Olly Shiner 08/04/2021

## 2021-08-04 NOTE — Progress Notes (Signed)
Patient's IV has infiltrated. IV pump was paused and beeping when I entered the room. Upon inspection patient has a large fluid filled sack on his arm extending from just above his elbow to about 1/3 of the way down his forearm. Elevation and warm compresses will be provided. Patient informed that swelling will go down over time once the fluid has a chance to absorb. Patient verbalizes understanding.

## 2021-08-05 DIAGNOSIS — K529 Noninfective gastroenteritis and colitis, unspecified: Secondary | ICD-10-CM | POA: Diagnosis not present

## 2021-08-05 LAB — BASIC METABOLIC PANEL
Anion gap: 8 (ref 5–15)
BUN: 39 mg/dL — ABNORMAL HIGH (ref 8–23)
CO2: 27 mmol/L (ref 22–32)
Calcium: 8.8 mg/dL — ABNORMAL LOW (ref 8.9–10.3)
Chloride: 104 mmol/L (ref 98–111)
Creatinine, Ser: 1.08 mg/dL (ref 0.61–1.24)
GFR, Estimated: >60 mL/min (ref >60)
Glucose, Bld: 125 mg/dL — ABNORMAL HIGH (ref 70–99)
Potassium: 4 mmol/L (ref 3.5–5.1)
Sodium: 139 mmol/L (ref 135–145)

## 2021-08-05 LAB — GLUCOSE, CAPILLARY: Glucose-Capillary: 132 mg/dL — ABNORMAL HIGH (ref 70–99)

## 2021-08-05 LAB — MAGNESIUM: Magnesium: 2.6 mg/dL — ABNORMAL HIGH (ref 1.7–2.4)

## 2021-08-05 MED ORDER — LACTATED RINGERS IV SOLN: INTRAVENOUS | Status: DC

## 2021-08-05 MED ORDER — SIMETHICONE 80 MG PO CHEW
80.0000 mg | CHEWABLE_TABLET | Freq: Four times a day (QID) | ORAL | Status: DC
  Administered 2021-08-05 (×2): 80 mg via ORAL
  Filled 2021-08-05 (×3): qty 1

## 2021-08-05 NOTE — Progress Notes (Addendum)
PROGRESS NOTE  [name] DOB: 05/12/1951 DOA: 08/03/2021 PCP: Sharon Seller, NP  HPI/Recap of past 24 hours: [name] is a 70 y.o. male with h/o hypertension, chronic systolic CHF- EF 42-59%, GERD, BPH, prior history of alcoholism presents with complaints of lower abdominal pain.  Work-up revealed influenza A infection and small bowel obstruction seen on CT abdomen and pelvis with contrast 08/03/21.  General surgery consulted, felt CT findings likely representative of enteritis and possibly ileus.  They recommended medical admission and supportive management.  Patient was started on Tamiflu for Influenza, Rocephin and IV Flagyl for enteritis.   08/05/2021: Patient was seen and examined at his bedside.  His aunt was not present in the room.  He reports lower abdominal pain, all across.  Abdomen mildly distended.  Abdominal xray done last night showed persistent SBO.  Denies flatus.  No bowel movement in the last 24 hours.  Assessment/Plan: Principal Problem:   Enteritis Active Problems:   Alcohol abuse   Diarrhea   Chronic systolic CHF (congestive heart failure) (HCC)   Cocaine use disorder, moderate, dependence (HCC)   Benign prostatic hyperplasia with weak urinary stream   AKI (acute kidney injury) (HCC)  SBO versus Acute enteritis with associated ileus Per general surgery's clinical presentation not consistent with bowel obstruction. Continue Rocephin and IV Flagyl Continue to optimize magnesium and potassium levels Continue IV fluid hydration Mobilize as tolerated General surgery following Afebrile with no leukocytosis  Influenza A viral infection Continue Tamiflu 30 mg twice daily x5 days, day#2 Bronchodilators as needed  Resolving AKI, suspect prerenal in setting of poor oral intake Baseline creatinine appears to be 0.75 with GFR greater than 60 Presented with creatinine of 1.59 with GFR 47 Creatinine is downtrending 1.08 with GFR>60 Continue  to avoid nephrotoxic agents, dehydration and hypotension. Continue IV fluid hydration and monitor urine output.  Hyperlipidemia Continue home Crestor  Generalized weakness/physical debility PT OT to assess Fall precautions    Code Status: Full code  Family Communication: None at bedside  Disposition Plan: Likely will discharge to home once ileus has resolved.   Consultants: General surgery  Procedures: None  Antimicrobials: Rocephin/ IV Flagyl  DVT prophylaxis: Subcutaneous Lovenox daily  Status is: Inpatient  Inpatient status.  Patient will require at least 2 midnights for further evaluation and treatment of present condition.      Objective: Vitals:   08/04/21 1604 08/04/21 2044 08/05/21 0423 08/05/21 0737  BP: 121/77 126/84 123/78 128/86  Pulse: 63 73 64 69  Resp: 18 18 18 16   Temp: 98.9 F (37.2 C) 98.2 F (36.8 C) 97.8 F (36.6 C) 98.1 F (36.7 C)  TempSrc: Oral Oral Oral Oral  SpO2: 100% 98% 99% 99%  Weight:      Height:        Intake/Output Summary (Last 24 hours) at 08/05/2021 1428 Last data filed at 08/05/2021 0556 Gross per 24 hour  Intake 1479.57 ml  Output 250 ml  Net 1229.57 ml   Filed Weights   08/03/21 1038  Weight: 81.6 kg    Exam:  General: 70 y.o. year-old male well-developed well-nourished in no acute stress.  He is alert and interactive.   Cardiovascular: Regular rate and rhythm no rubs or gallops.  Respiratory: Clear to auscultation no wheezes or rales.  Good inspiratory effort.   Abdomen: mildly distended, tender at lower quadrants.  Hypoactive bowel sounds noted.  Musculoskeletal: No lower extremity edema bilaterally.   Skin: No acute ulcerative  lesions noted.   Psychiatry: Mood is appropriate for condition and setting.  Neuro: Alert and awake.  Data Reviewed: CBC: Recent Labs  Lab 08/03/21 1045 08/03/21 1707 08/04/21 0214  WBC 4.7 3.9* 4.0  NEUTROABS 3.2  --   --   HGB 14.7 14.3 13.9  HCT 42.8 42.8 42.8   MCV 95.7 97.1 99.3  PLT 166 168 157   Basic Metabolic Panel: Recent Labs  Lab 08/03/21 1045 08/03/21 1707 08/04/21 0214 08/05/21 1315  NA 138  --  138 139  K 3.9  --  3.8 4.0  CL 102  --  103 104  CO2 26  --  24 27  GLUCOSE 110*  --  94 125*  BUN 34*  --  34* 39*  CREATININE 1.59* 1.31* 1.35* 1.08  CALCIUM 9.5  --  8.8* 8.8*  MG  --   --   --  2.6*   GFR: Estimated Creatinine Clearance: 70.9 mL/min (by C-G formula based on SCr of 1.08 mg/dL). Liver Function Tests: Recent Labs  Lab 08/03/21 1045 08/04/21 0214  AST 32 30  ALT 17 17  ALKPHOS 42 34*  BILITOT 0.8 0.6  PROT 7.8 6.8  ALBUMIN 4.4 3.6   Recent Labs  Lab 08/03/21 1045  LIPASE 24   No results for input(s): AMMONIA in the last 168 hours. Coagulation Profile: No results for input(s): INR, PROTIME in the last 168 hours. Cardiac Enzymes: No results for input(s): CKTOTAL, CKMB, CKMBINDEX, TROPONINI in the last 168 hours. BNP (last 3 results) No results for input(s): PROBNP in the last 8760 hours. HbA1C: No results for input(s): HGBA1C in the last 72 hours. CBG: Recent Labs  Lab 08/04/21 2041  GLUCAP 227*   Lipid Profile: No results for input(s): CHOL, HDL, LDLCALC, TRIG, CHOLHDL, LDLDIRECT in the last 72 hours. Thyroid Function Tests: No results for input(s): TSH, T4TOTAL, FREET4, T3FREE, THYROIDAB in the last 72 hours. Anemia Panel: No results for input(s): VITAMINB12, FOLATE, FERRITIN, TIBC, IRON, RETICCTPCT in the last 72 hours. Urine analysis:    Component Value Date/Time   COLORURINE YELLOW 12/06/2007 2258   APPEARANCEUR Cloudy (A) 10/28/2015 1615   LABSPEC 1.020 05/08/2011 1016   PHURINE 5.5 05/08/2011 1016   GLUCOSEU Negative 10/28/2015 1615   HGBUR SMALL (A) 05/08/2011 1016   BILIRUBINUR Negative 10/28/2015 1615   KETONESUR NEGATIVE 05/08/2011 1016   PROTEINUR Trace 10/28/2015 1615   PROTEINUR NEGATIVE 05/08/2011 1016   UROBILINOGEN 0.2 05/08/2011 1016   NITRITE Positive (A)  10/28/2015 1615   NITRITE NEGATIVE 05/08/2011 1016   LEUKOCYTESUR Negative 10/28/2015 1615   Sepsis Labs: @LABRCNTIP (procalcitonin:4,lacticidven:4)  ) Recent Results (from the past 240 hour(s))  Resp Panel by RT-PCR (Flu A&B, Covid) Nasopharyngeal Swab     Status: Abnormal   Collection Time: 08/03/21 12:31 PM   Specimen: Nasopharyngeal Swab; Nasopharyngeal(NP) swabs in vial transport medium  Result Value Ref Range Status   SARS Coronavirus 2 by RT PCR NEGATIVE NEGATIVE Final    Comment: (NOTE) SARS-CoV-2 target nucleic acids are NOT DETECTED.  The SARS-CoV-2 RNA is generally detectable in upper respiratory specimens during the acute phase of infection. The lowest concentration of SARS-CoV-2 viral copies this assay can detect is 138 copies/mL. A negative result does not preclude SARS-Cov-2 infection and should not be used as the sole basis for treatment or other patient management decisions. A negative result may occur with  improper specimen collection/handling, submission of specimen other than nasopharyngeal swab, presence of viral mutation(s) within the areas  targeted by this assay, and inadequate number of viral copies(<138 copies/mL). A negative result must be combined with clinical observations, patient history, and epidemiological information. The expected result is Negative.  Fact Sheet for Patients:  BloggerCourse.com  Fact Sheet for Healthcare Providers:  SeriousBroker.it  This test is no t yet approved or cleared by the [name] FDA and  has been authorized for detection and/or diagnosis of SARS-CoV-2 by FDA under an Emergency Use Authorization (EUA). This EUA will remain  in effect (meaning this test can be used) for the duration of the COVID-19 declaration under Section 564(b)(1) of the Act, 21 U.S.C.section 360bbb-3(b)(1), unless the authorization is terminated  or revoked sooner.       Influenza A by  PCR POSITIVE (A) NEGATIVE Final   Influenza B by PCR NEGATIVE NEGATIVE Final    Comment: (NOTE) The Xpert Xpress SARS-CoV-2/FLU/RSV plus assay is intended as an aid in the diagnosis of influenza from Nasopharyngeal swab specimens and should not be used as a sole basis for treatment. Nasal washings and aspirates are unacceptable for Xpert Xpress SARS-CoV-2/FLU/RSV testing.  Fact Sheet for Patients: BloggerCourse.com  Fact Sheet for Healthcare Providers: SeriousBroker.it  This test is not yet approved or cleared by the [name] FDA and has been authorized for detection and/or diagnosis of SARS-CoV-2 by FDA under an Emergency Use Authorization (EUA). This EUA will remain in effect (meaning this test can be used) for the duration of the COVID-19 declaration under Section 564(b)(1) of the Act, 21 U.S.C. section 360bbb-3(b)(1), unless the authorization is terminated or revoked.  Performed at [facility], [address], [place], [place] [address]       Studies: DG Abd Portable 1V-Small Bowel Obstruction Protocol-initial, 8 hr delay  Result Date: 08/04/2021 CLINICAL DATA:  Small bowel follow-through, 8 hour delay film. EXAM: PORTABLE ABDOMEN - 1 VIEW COMPARISON:  August 04, 2021 (10:32 a.m.) FINDINGS: Numerous dilated loops of opacified small bowel are seen within the abdomen and pelvis. Oral contrast extends to the level of the mid ileum. No radio-opaque calculi or other significant radiographic abnormality are seen. IMPRESSION: Small bowel obstruction with oral contrast seen to the approximate level of the mid ileum. Electronically Signed   By: Aram Candela M.D.   On: 08/04/2021 23:42    Scheduled Meds:  aspirin EC  81 mg Oral Daily   enoxaparin (LOVENOX) injection  40 mg Subcutaneous Q24H   influenza vaccine adjuvanted  0.5 mL Intramuscular Tomorrow-1000   oseltamivir  30 mg Oral BID   rosuvastatin  5 mg Oral Daily     Continuous Infusions:  cefTRIAXone (ROCEPHIN)  IV 2 g (08/04/21 1644)   lactated ringers     metronidazole 500 mg (08/05/21 0742)     LOS: 1 day     Darlin Drop, MD [facility] Pager [phone]  If 7PM-7AM, please contact night-coverage [email or web address] Password [email or web address] 08/05/2021, 2:28 PM

## 2021-08-05 NOTE — Evaluation (Signed)
Occupational Therapy Evaluation Patient Details Name: [name] MRN: [id number] DOB: [birth year] Today's Date: 08/05/2021   History of Present Illness Patient is a 70 y/o male who presents on 08/03/21 due to abdominal pain and diarrhea. CT abdomen/pelvis- dilated fluid and air-filled small intestine consistent with small bowel obstruction. Per surgery, ilieus vs enteritis vs partial SBO.  PMH includes HTN, chronic systolic CHF- EF 68%, prior history of alcoholism   Clinical Impression   Pt presents with pain and decreased balance and activity tolerance. Pt currently requiring supervision - Min guard with ADLs and functional transfers/mobility. Pt is independent at baseline, works, and lives with girlfriend in [place]. Pt should be safe to return home without any further skilled OT services once medically cleared. Would likely benefit from [facility] to use as shower chair to improve safety and reduce risk of falls in shower. Will follow acutely to maximize safety/independence with ADLs and functional transfer/mobility prior to return home.       Recommendations for follow up therapy are one component of a multi-disciplinary discharge planning process, led by the attending physician.  Recommendations may be updated based on patient status, additional functional criteria and insurance authorization.   Follow Up Recommendations  No OT follow up    Assistance Recommended at Discharge Intermittent Supervision/Assistance  Functional Status Assessment  Patient has had a recent decline in their functional status and demonstrates the ability to make significant improvements in function in a reasonable and predictable amount of time.  Equipment Recommendations  BSC    Recommendations for Other Services       Precautions / Restrictions Precautions Precautions: Fall Restrictions Weight Bearing Restrictions: No      Mobility Bed Mobility Overal bed mobility: Modified Independent                   Transfers Overall transfer level: Needs assistance Equipment used: Rolling walker (2 wheels) Transfers: Sit to/from Stand Sit to Stand: Min guard                  Balance Overall balance assessment: Needs assistance;History of Falls Sitting-balance support: Feet supported;No upper extremity supported Sitting balance-Leahy Scale: Good     Standing balance support: During functional activity Standing balance-Leahy Scale: Fair                             ADL either performed or assessed with clinical judgement   ADL Overall ADL's : Needs assistance/impaired Eating/Feeding: Independent   Grooming: Supervision/safety;Standing   Upper Body Bathing: Independent;Sitting   Lower Body Bathing: Supervison/ safety;Sit to/from stand   Upper Body Dressing : Independent;Sitting   Lower Body Dressing: Supervision/safety;Sit to/from stand   Toilet Transfer: Min guard;Ambulation;Regular Toilet;Grab bars;Rolling walker (2 wheels)   Toileting- Clothing Manipulation and Hygiene: Independent;Sitting/lateral lean       Functional mobility during ADLs: Min guard;Rolling walker (2 wheels) General ADL Comments: Pt limited by pain, balance, and activity tolerance.     Vision   Vision Assessment?: No apparent visual deficits     Perception     Praxis      Pertinent Vitals/Pain Pain Assessment: 0-10 Pain Score: 10-Worst pain ever Pain Location: abdomen Pain Descriptors / Indicators: Sore;Aching Pain Intervention(s): Monitored during session;Repositioned     Hand Dominance Right   Extremity/Trunk Assessment Upper Extremity Assessment Upper Extremity Assessment: Overall WFL for tasks assessed   Lower Extremity Assessment Lower Extremity Assessment: Defer to PT evaluation   Cervical /  Trunk Assessment Cervical / Trunk Assessment: Normal   Communication Communication Communication: Expressive difficulties   Cognition Arousal/Alertness:  Awake/alert Behavior During Therapy: WFL for tasks assessed/performed Overall Cognitive Status: Difficult to assess                                       General Comments       Exercises     Shoulder Instructions      Home Living Family/patient expects to be discharged to:: Private residence Living Arrangements: Spouse/significant other Available Help at Discharge: Family;Available PRN/intermittently Type of Home: Other(Comment) (motel) Home Access: Level entry     Home Layout: One level     Bathroom Shower/Tub: Chief Strategy Officer: Standard     Home Equipment: Cane - single point          Prior Functioning/Environment Prior Level of Function : Independent/Modified Independent             Mobility Comments: Works, does not drive. Reports falls in the shower and at work backwards. ADLs Comments: Does own ADLs, g/f does IADLs.        OT Problem List:        OT Treatment/Interventions: Self-care/ADL training;Therapeutic exercise;Neuromuscular education;Energy conservation;DME and/or AE instruction;Therapeutic activities;Patient/family education;Balance training    OT Goals(Current goals can be found in the care plan section) Acute Rehab OT Goals Patient Stated Goal: reduce pain OT Goal Formulation: With patient Time For Goal Achievement: 08/19/21 Potential to Achieve Goals: Good  OT Frequency: Min 2X/week   Barriers to D/C:            Co-evaluation              AM-PAC OT "6 Clicks" Daily Activity     Outcome Measure Help from another person eating meals?: None Help from another person taking care of personal grooming?: A Little Help from another person toileting, which includes using toliet, bedpan, or urinal?: A Little Help from another person bathing (including washing, rinsing, drying)?: A Little Help from another person to put on and taking off regular upper body clothing?: None Help from another person to  put on and taking off regular lower body clothing?: A Little 6 Click Score: 20   End of Session Equipment Utilized During Treatment: Rolling walker (2 wheels) Nurse Communication: Mobility status  Activity Tolerance: Patient limited by pain Patient left: in chair;with call bell/phone within reach  OT Visit Diagnosis: Unsteadiness on feet (R26.81);History of falling (Z91.81);Pain                Time: 3151-7616 OT Time Calculation (min): 15 min Charges:  OT General Charges $OT Visit: 1 Visit OT Evaluation $OT Eval Moderate Complexity: 1 Mod  Hillel Card C, OT/L  Acute Rehab [phone]  Lenice Llamas 08/05/2021, 2:27 PM

## 2021-08-05 NOTE — Progress Notes (Signed)
Patient ID: SHAQUIL MCCALLEN, male   DOB: [birth year], 70 y.o.   MRN: [id number] [facility] Surgery Progress Note:   * No surgery found *  Subjective: Mental status is alert and cooperative .  Complaints still having some mild abdominal pain. Objective: Vital signs in last 24 hours: Temp:  [97.8 F (36.6 C)-98.9 F (37.2 C)] 98.1 F (36.7 C) (11/05 0737) Pulse Rate:  [63-73] 69 (11/05 0737) Resp:  [16-18] 16 (11/05 0737) BP: (121-128)/(77-86) 128/86 (11/05 0737) SpO2:  [98 %-100 %] 99 % (11/05 0737)  Intake/Output from previous day: 11/04 0701 - 11/05 0700 In: 2419.6 [P.O.:1330; I.V.:892.7; IV Piggyback:196.8] Out: 250 [Urine:250] Intake/Output this shift: No intake/output data recorded.  Physical Exam: Work of breathing is normal.  No flatus  Lab Results:  Results for orders placed or performed during the hospital encounter of 08/03/21 (from the past 48 hour(s))  Comprehensive metabolic panel     Status: Abnormal   Collection Time: 08/03/21 10:45 AM  Result Value Ref Range   Sodium 138 135 - 145 mmol/L   Potassium 3.9 3.5 - 5.1 mmol/L   Chloride 102 98 - 111 mmol/L   CO2 26 22 - 32 mmol/L   Glucose, Bld 110 (H) 70 - 99 mg/dL    Comment: Glucose reference range applies only to samples taken after fasting for at least 8 hours.   BUN 34 (H) 8 - 23 mg/dL   Creatinine, Ser 1.59 (H) 0.61 - 1.24 mg/dL   Calcium 9.5 8.9 - 10.3 mg/dL   Total Protein 7.8 6.5 - 8.1 g/dL   Albumin 4.4 3.5 - 5.0 g/dL   AST 32 15 - 41 U/L   ALT 17 0 - 44 U/L   Alkaline Phosphatase 42 38 - 126 U/L   Total Bilirubin 0.8 0.3 - 1.2 mg/dL   GFR, Estimated 47 (L) >60 mL/min    Comment: (NOTE) Calculated using the CKD-EPI Creatinine Equation (2021)    Anion gap 10 5 - 15    Comment: Performed at [facility] [address], [place], [place] [address]  Lipase, blood     Status: None   Collection Time: 08/03/21 10:45 AM  Result Value Ref Range   Lipase 24 11 - 51 U/L    Comment: Performed at  [facility] [address] [place], [place] [address]  CBC with Diff     Status: None   Collection Time: 08/03/21 10:45 AM  Result Value Ref Range   WBC 4.7 4.0 - 10.5 K/uL   RBC 4.47 4.22 - 5.81 MIL/uL   Hemoglobin 14.7 13.0 - 17.0 g/dL   HCT 42.8 39.0 - 52.0 %   MCV 95.7 80.0 - 100.0 fL   MCH 32.9 26.0 - 34.0 pg   MCHC 34.3 30.0 - 36.0 g/dL   RDW 12.0 11.5 - 15.5 %   Platelets 166 150 - 400 K/uL   nRBC 0.0 0.0 - 0.2 %   Neutrophils Relative % 68 %   Neutro Abs 3.2 1.7 - 7.7 K/uL   Lymphocytes Relative 20 %   Lymphs Abs 0.9 0.7 - 4.0 K/uL   Monocytes Relative 12 %   Monocytes Absolute 0.6 0.1 - 1.0 K/uL   Eosinophils Relative 0 %   Eosinophils Absolute 0.0 0.0 - 0.5 K/uL   Basophils Relative 0 %   Basophils Absolute 0.0 0.0 - 0.1 K/uL   Immature Granulocytes 0 %   Abs Immature Granulocytes 0.01 0.00 - 0.07 K/uL  Comment: Performed at [facility], [address] [place], [place] [address]  Resp Panel by RT-PCR (Flu A&B, Covid) Nasopharyngeal Swab     Status: Abnormal   Collection Time: 08/03/21 12:31 PM   Specimen: Nasopharyngeal Swab; Nasopharyngeal(NP) swabs in vial transport medium  Result Value Ref Range   SARS Coronavirus 2 by RT PCR NEGATIVE NEGATIVE    Comment: (NOTE) SARS-CoV-2 target nucleic acids are NOT DETECTED.  The SARS-CoV-2 RNA is generally detectable in upper respiratory specimens during the acute phase of infection. The lowest concentration of SARS-CoV-2 viral copies this assay can detect is 138 copies/mL. A negative result does not preclude SARS-Cov-2 infection and should not be used as the sole basis for treatment or other patient management decisions. A negative result may occur with  improper specimen collection/handling, submission of specimen other than nasopharyngeal swab, presence of viral mutation(s) within the areas targeted by this assay, and inadequate number of viral copies(<138 copies/mL). A negative result must be  combined with clinical observations, patient history, and epidemiological information. The expected result is Negative.  Fact Sheet for Patients:  BloggerCourse.com  Fact Sheet for Healthcare Providers:  SeriousBroker.it  This test is no t yet approved or cleared by the [name] FDA and  has been authorized for detection and/or diagnosis of SARS-CoV-2 by FDA under an Emergency Use Authorization (EUA). This EUA will remain  in effect (meaning this test can be used) for the duration of the COVID-19 declaration under Section 564(b)(1) of the Act, 21 U.S.C.section 360bbb-3(b)(1), unless the authorization is terminated  or revoked sooner.       Influenza A by PCR POSITIVE (A) NEGATIVE   Influenza B by PCR NEGATIVE NEGATIVE    Comment: (NOTE) The Xpert Xpress SARS-CoV-2/FLU/RSV plus assay is intended as an aid in the diagnosis of influenza from Nasopharyngeal swab specimens and should not be used as a sole basis for treatment. Nasal washings and aspirates are unacceptable for Xpert Xpress SARS-CoV-2/FLU/RSV testing.  Fact Sheet for Patients: BloggerCourse.com  Fact Sheet for Healthcare Providers: SeriousBroker.it  This test is not yet approved or cleared by the [name] FDA and has been authorized for detection and/or diagnosis of SARS-CoV-2 by FDA under an Emergency Use Authorization (EUA). This EUA will remain in effect (meaning this test can be used) for the duration of the COVID-19 declaration under Section 564(b)(1) of the Act, 21 U.S.C. section 360bbb-3(b)(1), unless the authorization is terminated or revoked.  Performed at [facility], [address], [place], [place] [address]   HIV Antibody (routine testing w rflx)     Status: None   Collection Time: 08/03/21  5:07 PM  Result Value Ref Range   HIV Screen 4th Generation wRfx Non Reactive Non Reactive     Comment: Performed at [facility], [address], [place], [place] [address]  CBC     Status: Abnormal   Collection Time: 08/03/21  5:07 PM  Result Value Ref Range   WBC 3.9 (L) 4.0 - 10.5 K/uL   RBC 4.41 4.22 - 5.81 MIL/uL   Hemoglobin 14.3 13.0 - 17.0 g/dL   HCT 53.2 99.2 - 42.6 %   MCV 97.1 80.0 - 100.0 fL   MCH 32.4 26.0 - 34.0 pg   MCHC 33.4 30.0 - 36.0 g/dL   RDW 83.4 19.6 - 22.2 %   Platelets 168 150 - 400 K/uL   nRBC 0.0 0.0 - 0.2 %    Comment: Performed at [facility], [place] [address], [place], [place] [address]  Creatinine, serum     Status: Abnormal   Collection Time: 08/03/21  5:07 PM  Result Value Ref Range   Creatinine, Ser 1.31 (H) 0.61 - 1.24 mg/dL   GFR, Estimated 59 (L) >60 mL/min    Comment: (NOTE) Calculated using the CKD-EPI Creatinine Equation (2021) Performed at [facility] [address] [place], [place] [address]   C-reactive protein     Status: Abnormal   Collection Time: 08/03/21  5:07 PM  Result Value Ref Range   CRP 3.5 (H) <1.0 mg/dL    Comment: Performed at [facility], [place] [address], [place], [place] [address]  Comprehensive metabolic panel     Status: Abnormal   Collection Time: 08/04/21  2:14 AM  Result Value Ref Range   Sodium 138 135 - 145 mmol/L   Potassium 3.8 3.5 - 5.1 mmol/L   Chloride 103 98 - 111 mmol/L   CO2 24 22 - 32 mmol/L   Glucose, Bld 94 70 - 99 mg/dL    Comment: Glucose reference range applies only to samples taken after fasting for at least 8 hours.   BUN 34 (H) 8 - 23 mg/dL   Creatinine, Ser 1.35 (H) 0.61 - 1.24 mg/dL   Calcium 8.8 (L) 8.9 - 10.3 mg/dL   Total Protein 6.8 6.5 - 8.1 g/dL   Albumin 3.6 3.5 - 5.0 g/dL   AST 30 15 - 41 U/L   ALT 17 0 - 44 U/L   Alkaline Phosphatase 34 (L) 38 - 126 U/L   Total Bilirubin 0.6 0.3 - 1.2 mg/dL   GFR, Estimated 57 (L) >60 mL/min    Comment: (NOTE) Calculated using the CKD-EPI Creatinine Equation (2021)    Anion gap 11 5 - 15    Comment:  Performed at [facility] [address], [place] [address]  CBC     Status: None   Collection Time: 08/04/21  2:14 AM  Result Value Ref Range   WBC 4.0 4.0 - 10.5 K/uL   RBC 4.31 4.22 - 5.81 MIL/uL   Hemoglobin 13.9 13.0 - 17.0 g/dL   HCT 42.8 39.0 - 52.0 %   MCV 99.3 80.0 - 100.0 fL   MCH 32.3 26.0 - 34.0 pg   MCHC 32.5 30.0 - 36.0 g/dL   RDW 12.0 11.5 - 15.5 %   Platelets 157 150 - 400 K/uL   nRBC 0.0 0.0 - 0.2 %    Comment: Performed at [facility], [place] [address] [place], [place] [address]  Glucose, capillary     Status: Abnormal   Collection Time: 08/04/21  8:41 PM  Result Value Ref Range   Glucose-Capillary 227 (H) 70 - 99 mg/dL    Comment: Glucose reference range applies only to samples taken after fasting for at least 8 hours.    Radiology/Results: CT ABDOMEN PELVIS W CONTRAST  Result Date: 08/03/2021 CLINICAL DATA:  Right lower quadrant abdominal pain. Appendicitis suspected. Duration of symptoms 3 days. EXAM: CT ABDOMEN AND PELVIS WITH CONTRAST TECHNIQUE: Multidetector CT imaging of the abdomen and pelvis was performed using the standard protocol following bolus administration of intravenous contrast. CONTRAST:  136mL OMNIPAQUE IOHEXOL 300 MG/ML  SOLN COMPARISON:  02/21/2021 FINDINGS: Lower chest: Lung bases are clear except for minimal scarring. Hepatobiliary: Multiple liver cysts as seen previously. No acute liver pathology. No calcified gallstones. Pancreas: Normal Right lower quadrant abdominal pain.  Appendicitis suspected. Duration of symptoms 3 days. Spleen: Normal Adrenals/Urinary Tract: Adrenal glands are normal. Kidneys are normal except for a few tiny cysts. Bladder is normal. Stomach/Bowel: The stomach is normal. There is dilated fluid and air-filled small intestine there is a transition zone in the mid to distal ileum. This is consistent with small bowel obstruction. Precise etiology not established. The colon is largely collapsed. I think the  appendix is normal. Vascular/Lymphatic: Mild aortic atherosclerosis. No aneurysm. IVC is normal. No retroperitoneal adenopathy. Reproductive: Normal Other: No free fluid or air. Musculoskeletal: Ordinary lumbar degenerative changes. IMPRESSION: Small-bowel obstruction, probably in the mid ileal region. Etiology not clearly established. Multiple hepatic cysts and renal cysts as seen previously. Aortic Atherosclerosis (ICD10-I70.0). Electronically Signed   By: Nelson Chimes M.D.   On: 08/03/2021 13:01   DG Abd Portable 1V-Small Bowel Obstruction Protocol-initial, 8 hr delay  Result Date: 08/04/2021 CLINICAL DATA:  Small bowel follow-through, 8 hour delay film. EXAM: PORTABLE ABDOMEN - 1 VIEW COMPARISON:  August 04, 2021 (10:32 a.m.) FINDINGS: Numerous dilated loops of opacified small bowel are seen within the abdomen and pelvis. Oral contrast extends to the level of the mid ileum. No radio-opaque calculi or other significant radiographic abnormality are seen. IMPRESSION: Small bowel obstruction with oral contrast seen to the approximate level of the mid ileum. Electronically Signed   By: Virgina Norfolk M.D.   On: 08/04/2021 23:42   DG Abd Portable 1V  Result Date: 08/04/2021 CLINICAL DATA:  Abdominal pain EXAM: PORTABLE ABDOMEN - 1 VIEW COMPARISON:  CT 1 day prior FINDINGS: 2 supine views. Right hemidiaphragm elevation. Proximal and mid small bowel dilatation including at up to 4.2 cm, similar to the prior CT scout film. Normal caliber rectal gas. No gross free intraperitoneal air. Contrast within the urinary bladder. IMPRESSION: Similar partial small bowel obstruction. Electronically Signed   By: Abigail Miyamoto M.D.   On: 08/04/2021 10:43    Anti-infectives: Anti-infectives (From admission, onward)    Start     Dose/Rate Route Frequency Ordered Stop   08/04/21 2200  oseltamivir (TAMIFLU) capsule 30 mg        30 mg Oral 2 times daily 08/04/21 [id number] 08/09/21 0959   08/04/21 1000  oseltamivir (TAMIFLU)  capsule 75 mg        75 mg Oral  Once 08/04/21 0825 08/04/21 1116   08/03/21 1645  cefTRIAXone (ROCEPHIN) 2 g in sodium chloride 0.9 % 100 mL IVPB        2 g 200 mL/hr over 30 Minutes Intravenous Every 24 hours 08/03/21 1631     08/03/21 1645  metroNIDAZOLE (FLAGYL) IVPB 500 mg        500 mg 100 mL/hr over 60 Minutes Intravenous Every 12 hours 08/03/21 1631         Assessment/Plan: Problem List: Patient Active Problem List   Diagnosis Date Noted   Enteritis 08/03/2021   AKI (acute kidney injury) ([place]) 08/03/2021   BPH with obstruction/lower urinary tract symptoms 05/16/2021   Wrist pain, acute, left 03/21/2021   Lumbar radiculopathy, right 01/03/2018   Right hip pain 12/03/2017   Benign prostatic hyperplasia with weak urinary stream 06/07/2017   Essential hypertension 04/26/2017   Seasonal allergies 05/25/2016   Severe major depression, single episode, without psychotic features ([place]) 03/04/2016   Alcohol use disorder, severe, dependence ([place]) 03/04/2016   Cocaine use disorder, moderate, dependence ([place]) 03/04/2016   NICM (nonischemic cardiomyopathy) ([place]) 0000000   Chronic systolic CHF (congestive heart failure) ([place]) 01/23/2016  Epigastric abdominal pain [id number]   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   Diarrhea 06/13/2011   DERMATOPHYTOSIS OF SCALP AND BEARD 03/18/2008   TOBACCO USER 01/02/2008   Alcohol abuse 11/28/2006    No progress in the resolution of the ileus-will continue observation however.   * No surgery found *    LOS: 1 day   Matt B. Hassell Done, MD, [facility] [phone] to reach the surgeon on call.    08/05/2021 8:49 AM

## 2021-08-06 ENCOUNTER — Inpatient Hospital Stay (HOSPITAL_COMMUNITY): Payer: Medicare HMO

## 2021-08-06 DIAGNOSIS — K529 Noninfective gastroenteritis and colitis, unspecified: Secondary | ICD-10-CM | POA: Diagnosis not present

## 2021-08-06 LAB — CBC WITH DIFFERENTIAL/PLATELET
Abs Immature Granulocytes: 0 10*3/uL (ref 0.00–0.07)
Basophils Absolute: 0 10*3/uL (ref 0.0–0.1)
Basophils Relative: 0 %
Eosinophils Absolute: 0 10*3/uL (ref 0.0–0.5)
Eosinophils Relative: 0 %
HCT: 46 % (ref 39.0–52.0)
Hemoglobin: 15 g/dL (ref 13.0–17.0)
Lymphocytes Relative: 31 %
Lymphs Abs: 0.7 10*3/uL (ref 0.7–4.0)
MCH: 31.9 pg (ref 26.0–34.0)
MCHC: 32.6 g/dL (ref 30.0–36.0)
MCV: 97.9 fL (ref 80.0–100.0)
Monocytes Absolute: 0.2 10*3/uL (ref 0.1–1.0)
Monocytes Relative: 11 %
Neutro Abs: 1.3 10*3/uL — ABNORMAL LOW (ref 1.7–7.7)
Neutrophils Relative %: 58 %
Platelets: 105 10*3/uL — ABNORMAL LOW (ref 150–400)
RBC: 4.7 MIL/uL (ref 4.22–5.81)
RDW: 11.9 % (ref 11.5–15.5)
WBC: 2.2 10*3/uL — ABNORMAL LOW (ref 4.0–10.5)
nRBC: 0 % (ref 0.0–0.2)

## 2021-08-06 LAB — CBC
HCT: 43.3 % (ref 39.0–52.0)
Hemoglobin: 14.2 g/dL (ref 13.0–17.0)
MCH: 32 pg (ref 26.0–34.0)
MCHC: 32.8 g/dL (ref 30.0–36.0)
MCV: 97.5 fL (ref 80.0–100.0)
Platelets: 167 10*3/uL (ref 150–400)
RBC: 4.44 MIL/uL (ref 4.22–5.81)
RDW: 11.9 % (ref 11.5–15.5)
WBC: 2.5 10*3/uL — ABNORMAL LOW (ref 4.0–10.5)
nRBC: 0 % (ref 0.0–0.2)

## 2021-08-06 LAB — BASIC METABOLIC PANEL
Anion gap: 10 (ref 5–15)
BUN: 29 mg/dL — ABNORMAL HIGH (ref 8–23)
CO2: 26 mmol/L (ref 22–32)
Calcium: 8.8 mg/dL — ABNORMAL LOW (ref 8.9–10.3)
Chloride: 104 mmol/L (ref 98–111)
Creatinine, Ser: 0.88 mg/dL (ref 0.61–1.24)
GFR, Estimated: >60 mL/min (ref >60)
Glucose, Bld: 104 mg/dL — ABNORMAL HIGH (ref 70–99)
Potassium: 4.2 mmol/L (ref 3.5–5.1)
Sodium: 140 mmol/L (ref 135–145)

## 2021-08-06 LAB — GLUCOSE, CAPILLARY
Glucose-Capillary: 94 mg/dL (ref 70–99)
Glucose-Capillary: 105 mg/dL — ABNORMAL HIGH (ref 70–99)
Glucose-Capillary: 97 mg/dL (ref 70–99)

## 2021-08-06 MED ORDER — BISACODYL 10 MG RE SUPP
10.0000 mg | Freq: Every day | RECTAL | Status: AC
  Administered 2021-08-06: 10 mg via RECTAL
  Filled 2021-08-06: qty 1

## 2021-08-06 MED ORDER — LACTATED RINGERS IV SOLN: INTRAVENOUS | Status: AC

## 2021-08-06 MED ORDER — SIMETHICONE 80 MG PO CHEW
80.0000 mg | CHEWABLE_TABLET | Freq: Four times a day (QID) | ORAL | Status: AC
  Administered 2021-08-06 – 2021-08-07 (×2): 80 mg via ORAL
  Filled 2021-08-06 (×3): qty 1

## 2021-08-06 MED ORDER — DIATRIZOATE MEGLUMINE & SODIUM 66-10 % PO SOLN
90.0000 mL | Freq: Once | ORAL | Status: AC
  Administered 2021-08-06: 90 mL via NASOGASTRIC
  Filled 2021-08-06: qty 90

## 2021-08-06 NOTE — Plan of Care (Signed)

## 2021-08-06 NOTE — Progress Notes (Addendum)
PROGRESS NOTE  [name] DDU:[id number] DOB: [birth year] DOA: 08/03/2021 PCP: Sharon Seller, NP  HPI/Recap of past 24 hours: [name] is a 70 y.o. male with h/o hypertension, chronic systolic CHF- EF 23-76%, GERD, BPH, prior history of alcoholism presents with complaints of lower abdominal pain.  Work-up revealed influenza A infection and small bowel obstruction seen on CT abdomen and pelvis with contrast done on 08/03/21.  General surgery consulted, felt CT findings likely representative of enteritis and possibly ileus.  Recommended medical admission and supportive management.  Patient was started on Tamiflu for Influenza, Rocephin and IV Flagyl for enteritis.  Due to no improvement of SBO, NG tube and SBO protocol were applied on 08/06/2021.   08/06/2021: Patient was seen and examined at his bedside.  He reports persistent lower abdominal pain associated with no flatulence and no BM.  Added scheduled simethicone and supp dulcolax daily.  General surgery following this AM.   Assessment/Plan: Principal Problem:   Enteritis Active Problems:   Alcohol abuse   Diarrhea   Chronic systolic CHF (congestive heart failure) (HCC)   Cocaine use disorder, moderate, dependence (HCC)   Benign prostatic hyperplasia with weak urinary stream   AKI (acute kidney injury) (HCC)  Persistent SBO with questionable acute enteritis Per general surgery's clinical presentation not consistent with bowel obstruction. Hold off Rocephin and IV Flagyl on 08/06/2021 Continue to optimize magnesium and potassium levels C/w IV fluid hydration Continue to Mobilize as tolerated General surgery following Afebrile with no leukocytosis Insert NG tube, per small bowel obstruction protocol.  Influenza A viral infection Continue Tamiflu 30 mg twice daily x5 days, day#3 Continue Bronchodilators as needed  Leukopenia/acute thrombocytopenia, likely in the setting of acute illness WBC 2.2, platelet count  105 Repeat CBC this afternoon  HFREF 20-25% from 2D echo 12/12/15 Closely monitor volume status while on IV fluid hydration in the setting of SBO. Resume cardiac medications once no longer NPO. Strict I's and O's and daily weight  Resolved AKI, suspect prerenal in setting of poor oral intake Baseline creatinine appears to be 0.75 with GFR greater than 60 Presented with creatinine of 1.59 with GFR 47 Creatinine is downtrending 1.08 with GFR>60 Continue to avoid nephrotoxic agents, dehydration and hypotension. Continue IV fluid hydration and monitor urine output.  Hyperlipidemia Continue home Crestor  Generalized weakness/physical debility PT OT assessed and they have no further recommendations Continue Fall precautions    Code Status: Full code  Family Communication: None at bedside  Disposition Plan: Likely will discharge to home once ileus has resolved.   Consultants: General surgery  Procedures: None  Antimicrobials: Rocephin/ IV Flagyl  DVT prophylaxis: Subcutaneous Lovenox daily  Status is: Inpatient  Inpatient status.  Patient will require at least 2 midnights for further evaluation and treatment of present condition.      Objective: Vitals:   08/05/21 1643 08/05/21 2026 08/06/21 0534 08/06/21 0910  BP: (!) 143/89 (!) 127/94 135/90 (!) 148/95  Pulse: 65 67 60 68  Resp: 16 17 17 19   Temp: 98.4 F (36.9 C) 98.2 F (36.8 C) 98.5 F (36.9 C) 97.9 F (36.6 C)  TempSrc: Oral Oral Oral Oral  SpO2:  99% 99% 100%  Weight:      Height:        Intake/Output Summary (Last 24 hours) at 08/06/2021 1453 Last data filed at 08/06/2021 1244 Gross per 24 hour  Intake 1100 ml  Output 300 ml  Net 800 ml   13/03/2021  08/03/21 1038  Weight: 81.6 kg    Exam:  General: 70 y.o. year-old male well-developed well-nourished no acute distress.  He is alert and interactive.   Cardiovascular: Regular rate and rhythm no rubs or gallops. Respiratory: Clear to  auscultation no wheezes or rales. Abdomen: Mildly distended, lower abdominal pain with palpation.  Hypoactive bowel sounds.   Musculoskeletal: No lower extremity edema bilaterally. Skin: No ulcerative lesions noted. Psychiatry: Mood is appropriate for condition and setting.  Neuro: Alert and awake, moves all 4 extremities.  Data Reviewed: CBC: Recent Labs  Lab 08/03/21 1045 08/03/21 1707 08/04/21 0214 08/06/21 0814  WBC 4.7 3.9* 4.0 2.2*  NEUTROABS 3.2  --   --  1.3*  HGB 14.7 14.3 13.9 15.0  HCT 42.8 42.8 42.8 46.0  MCV 95.7 97.1 99.3 97.9  PLT 166 168 157 105*   Basic Metabolic Panel: Recent Labs  Lab 08/03/21 1045 08/03/21 1707 08/04/21 0214 08/05/21 1315 08/06/21 0814  NA 138  --  138 139 140  K 3.9  --  3.8 4.0 4.2  CL 102  --  103 104 104  CO2 26  --  24 27 26   GLUCOSE 110*  --  94 125* 104*  BUN 34*  --  34* 39* 29*  CREATININE 1.59* 1.31* 1.35* 1.08 0.88  CALCIUM 9.5  --  8.8* 8.8* 8.8*  MG  --   --   --  2.6*  --    GFR: Estimated Creatinine Clearance: 87 mL/min (by C-G formula based on SCr of 0.88 mg/dL). Liver Function Tests: Recent Labs  Lab 08/03/21 1045 08/04/21 0214  AST 32 30  ALT 17 17  ALKPHOS 42 34*  BILITOT 0.8 0.6  PROT 7.8 6.8  ALBUMIN 4.4 3.6   Recent Labs  Lab 08/03/21 1045  LIPASE 24   No results for input(s): AMMONIA in the last 168 hours. Coagulation Profile: No results for input(s): INR, PROTIME in the last 168 hours. Cardiac Enzymes: No results for input(s): CKTOTAL, CKMB, CKMBINDEX, TROPONINI in the last 168 hours. BNP (last 3 results) No results for input(s): PROBNP in the last 8760 hours. HbA1C: No results for input(s): HGBA1C in the last 72 hours. CBG: Recent Labs  Lab 08/04/21 2041 08/05/21 2026 08/06/21 1243  GLUCAP 227* 132* 94   Lipid Profile: No results for input(s): CHOL, HDL, LDLCALC, TRIG, CHOLHDL, LDLDIRECT in the last 72 hours. Thyroid Function Tests: No results for input(s): TSH, T4TOTAL, FREET4,  T3FREE, THYROIDAB in the last 72 hours. Anemia Panel: No results for input(s): VITAMINB12, FOLATE, FERRITIN, TIBC, IRON, RETICCTPCT in the last 72 hours. Urine analysis:    Component Value Date/Time   COLORURINE YELLOW 12/06/2007 2258   APPEARANCEUR Cloudy (A) 10/28/2015 1615   LABSPEC 1.020 05/08/2011 1016   PHURINE 5.5 05/08/2011 1016   GLUCOSEU Negative 10/28/2015 1615   HGBUR SMALL (A) 05/08/2011 1016   BILIRUBINUR Negative 10/28/2015 1615   KETONESUR NEGATIVE 05/08/2011 1016   PROTEINUR Trace 10/28/2015 1615   PROTEINUR NEGATIVE 05/08/2011 1016   UROBILINOGEN 0.2 05/08/2011 1016   NITRITE Positive (A) 10/28/2015 1615   NITRITE NEGATIVE 05/08/2011 1016   LEUKOCYTESUR Negative 10/28/2015 1615   Sepsis Labs: @LABRCNTIP (procalcitonin:4,lacticidven:4)  ) Recent Results (from the past 240 hour(s))  Resp Panel by RT-PCR (Flu A&B, Covid) Nasopharyngeal Swab     Status: Abnormal   Collection Time: 08/03/21 12:31 PM   Specimen: Nasopharyngeal Swab; Nasopharyngeal(NP) swabs in vial transport medium  Result Value Ref Range Status   SARS Coronavirus  2 by RT PCR NEGATIVE NEGATIVE Final    Comment: (NOTE) SARS-CoV-2 target nucleic acids are NOT DETECTED.  The SARS-CoV-2 RNA is generally detectable in upper respiratory specimens during the acute phase of infection. The lowest concentration of SARS-CoV-2 viral copies this assay can detect is 138 copies/mL. A negative result does not preclude SARS-Cov-2 infection and should not be used as the sole basis for treatment or other patient management decisions. A negative result may occur with  improper specimen collection/handling, submission of specimen other than nasopharyngeal swab, presence of viral mutation(s) within the areas targeted by this assay, and inadequate number of viral copies(<138 copies/mL). A negative result must be combined with clinical observations, patient history, and epidemiological information. The expected result  is Negative.  Fact Sheet for Patients:  BloggerCourse.com  Fact Sheet for Healthcare Providers:  SeriousBroker.it  This test is no t yet approved or cleared by the [name] FDA and  has been authorized for detection and/or diagnosis of SARS-CoV-2 by FDA under an Emergency Use Authorization (EUA). This EUA will remain  in effect (meaning this test can be used) for the duration of the COVID-19 declaration under Section 564(b)(1) of the Act, 21 U.S.C.section 360bbb-3(b)(1), unless the authorization is terminated  or revoked sooner.       Influenza A by PCR POSITIVE (A) NEGATIVE Final   Influenza B by PCR NEGATIVE NEGATIVE Final    Comment: (NOTE) The Xpert Xpress SARS-CoV-2/FLU/RSV plus assay is intended as an aid in the diagnosis of influenza from Nasopharyngeal swab specimens and should not be used as a sole basis for treatment. Nasal washings and aspirates are unacceptable for Xpert Xpress SARS-CoV-2/FLU/RSV testing.  Fact Sheet for Patients: BloggerCourse.com  Fact Sheet for Healthcare Providers: SeriousBroker.it  This test is not yet approved or cleared by the [name] FDA and has been authorized for detection and/or diagnosis of SARS-CoV-2 by FDA under an Emergency Use Authorization (EUA). This EUA will remain in effect (meaning this test can be used) for the duration of the COVID-19 declaration under Section 564(b)(1) of the Act, 21 U.S.C. section 360bbb-3(b)(1), unless the authorization is terminated or revoked.  Performed at [facility], [address] [place], [place] [address]       Studies: DG Abd Portable 1V  Result Date: 08/06/2021 CLINICAL DATA:  Abdominal pain. EXAM: PORTABLE ABDOMEN - 1 VIEW COMPARISON:  August 04, 2021 FINDINGS: Persistent abnormal distension of small bowel loops. The colon is mostly decompressed with pockets of residual  gas. No definite evidence of free intraperitoneal gas IMPRESSION: Persistent small bowel obstruction. Electronically Signed   By: Ted Mcalpine M.D.   On: 08/06/2021 09:54    Scheduled Meds:  aspirin EC  81 mg Oral Daily   bisacodyl  10 mg Rectal Daily   enoxaparin (LOVENOX) injection  40 mg Subcutaneous Q24H   influenza vaccine adjuvanted  0.5 mL Intramuscular Tomorrow-1000   oseltamivir  30 mg Oral BID   rosuvastatin  5 mg Oral Daily   simethicone  80 mg Oral QID    Continuous Infusions:  cefTRIAXone (ROCEPHIN)  IV 2 g (08/05/21 1736)   lactated ringers 100 mL/hr at 08/06/21 0516   metronidazole 500 mg (08/06/21 0929)     LOS: 2 days     Darlin Drop, MD [facility] Pager [phone]  If 7PM-7AM, please contact night-coverage [email or web address] Password [facility] 08/06/2021, 2:53 PM

## 2021-08-06 NOTE — Progress Notes (Signed)
Patient ID: [name], male   DOB: 04-27-1951, 70 y.o.   MRN: [id number] [facility] Surgery Progress Note:   * No surgery found *  Subjective: Mental status is alert and oriented.  Complaints cough and midabdominal pain with cough. Objective: Vital signs in last 24 hours: Temp:  [98.2 F (36.8 C)-98.5 F (36.9 C)] 98.5 F (36.9 C) (11/06 0534) Pulse Rate:  [60-67] 60 (11/06 0534) Resp:  [16-17] 17 (11/06 0534) BP: (127-143)/(89-94) 135/90 (11/06 0534) SpO2:  [99 %] 99 % (11/06 0534)  Intake/Output from previous day: 11/05 0701 - 11/06 0700 In: 1100 [I.V.:1100] Out: 300 [Urine:300] Intake/Output this shift: No intake/output data recorded.  Physical Exam: Work of breathing is not labored but with triggered productive cough.  Some abdominal pain with cough  Lab Results:  Results for orders placed or performed during the hospital encounter of 08/03/21 (from the past 48 hour(s))  Glucose, capillary     Status: Abnormal   Collection Time: 08/04/21  8:41 PM  Result Value Ref Range   Glucose-Capillary 227 (H) 70 - 99 mg/dL    Comment: Glucose reference range applies only to samples taken after fasting for at least 8 hours.  Basic metabolic panel     Status: Abnormal   Collection Time: 08/05/21  1:15 PM  Result Value Ref Range   Sodium 139 135 - 145 mmol/L   Potassium 4.0 3.5 - 5.1 mmol/L   Chloride 104 98 - 111 mmol/L   CO2 27 22 - 32 mmol/L   Glucose, Bld 125 (H) 70 - 99 mg/dL    Comment: Glucose reference range applies only to samples taken after fasting for at least 8 hours.   BUN 39 (H) 8 - 23 mg/dL   Creatinine, Ser 9.67 0.61 - 1.24 mg/dL   Calcium 8.8 (L) 8.9 - 10.3 mg/dL   GFR, Estimated >89 >38 mL/min    Comment: (NOTE) Calculated using the CKD-EPI Creatinine Equation (2021)    Anion gap 8 5 - 15    Comment: Performed at [facility], [address], [place], [place] [address]  Magnesium     Status: Abnormal   Collection Time: 08/05/21  1:15 PM   Result Value Ref Range   Magnesium 2.6 (H) 1.7 - 2.4 mg/dL    Comment: Performed at [facility], [address], [place], [place] [address]  Glucose, capillary     Status: Abnormal   Collection Time: 08/05/21  8:26 PM  Result Value Ref Range   Glucose-Capillary 132 (H) 70 - 99 mg/dL    Comment: Glucose reference range applies only to samples taken after fasting for at least 8 hours.    Radiology/Results: DG Abd Portable 1V-Small Bowel Obstruction Protocol-initial, 8 hr delay  Result Date: 08/04/2021 CLINICAL DATA:  Small bowel follow-through, 8 hour delay film. EXAM: PORTABLE ABDOMEN - 1 VIEW COMPARISON:  August 04, 2021 (10:32 a.m.) FINDINGS: Numerous dilated loops of opacified small bowel are seen within the abdomen and pelvis. Oral contrast extends to the level of the mid ileum. No radio-opaque calculi or other significant radiographic abnormality are seen. IMPRESSION: Small bowel obstruction with oral contrast seen to the approximate level of the mid ileum. Electronically Signed   By: Aram Candela M.D.   On: 08/04/2021 23:42   DG Abd Portable 1V  Result Date: 08/04/2021 CLINICAL DATA:  Abdominal pain EXAM: PORTABLE ABDOMEN - 1 VIEW COMPARISON:  CT 1 day prior FINDINGS: 2 supine views. Right hemidiaphragm elevation.  Proximal and mid small bowel dilatation including at up to 4.2 cm, similar to the prior CT scout film. Normal caliber rectal gas. No gross free intraperitoneal air. Contrast within the urinary bladder. IMPRESSION: Similar partial small bowel obstruction. Electronically Signed   By: Jeronimo Greaves M.D.   On: 08/04/2021 10:43    Anti-infectives: Anti-infectives (From admission, onward)    Start     Dose/Rate Route Frequency Ordered Stop   08/04/21 2200  oseltamivir (TAMIFLU) capsule 30 mg        30 mg Oral 2 times daily 08/04/21 3154 08/09/21 0959   08/04/21 1000  oseltamivir (TAMIFLU) capsule 75 mg        75 mg Oral  Once 08/04/21 0825 08/04/21 1116   08/03/21  1645  cefTRIAXone (ROCEPHIN) 2 g in sodium chloride 0.9 % 100 mL IVPB        2 g 200 mL/hr over 30 Minutes Intravenous Every 24 hours 08/03/21 1631     08/03/21 1645  metroNIDAZOLE (FLAGYL) IVPB 500 mg        500 mg 100 mL/hr over 60 Minutes Intravenous Every 12 hours 08/03/21 1631         Assessment/Plan: Problem List: Patient Active Problem List   Diagnosis Date Noted   Enteritis 08/03/2021   AKI (acute kidney injury) (HCC) 08/03/2021   BPH with obstruction/lower urinary tract symptoms 05/16/2021   Wrist pain, acute, left 03/21/2021   Lumbar radiculopathy, right 01/03/2018   Right hip pain 12/03/2017   Benign prostatic hyperplasia with weak urinary stream 06/07/2017   Essential hypertension 04/26/2017   Seasonal allergies 05/25/2016   Severe major depression, single episode, without psychotic features (HCC) 03/04/2016   Alcohol use disorder, severe, dependence (HCC) 03/04/2016   Cocaine use disorder, moderate, dependence (HCC) 03/04/2016   NICM (nonischemic cardiomyopathy) (HCC) 01/23/2016   Chronic systolic CHF (congestive heart failure) (HCC) 01/23/2016   Epigastric abdominal pain 08/20/2011   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   Diarrhea 06/13/2011   DERMATOPHYTOSIS OF SCALP AND BEARD 03/18/2008   TOBACCO USER 01/02/2008   Alcohol abuse 11/28/2006    No flatus today.  Mildly distended.  Will check acute abdomen with CXR to see if her has infiltrate or pneumonia that could cause ileus.   * No surgery found *    LOS: 2 days   Matt B. Daphine Deutscher, MD, [facility] [phone] to reach the surgeon on call.    08/06/2021 7:59 AM

## 2021-08-07 ENCOUNTER — Inpatient Hospital Stay (HOSPITAL_COMMUNITY): Payer: Medicare HMO

## 2021-08-07 DIAGNOSIS — K529 Noninfective gastroenteritis and colitis, unspecified: Secondary | ICD-10-CM | POA: Diagnosis not present

## 2021-08-07 LAB — CBC
HCT: 44.6 % (ref 39.0–52.0)
Hemoglobin: 14.5 g/dL (ref 13.0–17.0)
MCH: 31.9 pg (ref 26.0–34.0)
MCHC: 32.5 g/dL (ref 30.0–36.0)
MCV: 98.2 fL (ref 80.0–100.0)
Platelets: 166 10*3/uL (ref 150–400)
RBC: 4.54 MIL/uL (ref 4.22–5.81)
RDW: 11.8 % (ref 11.5–15.5)
WBC: 2.7 10*3/uL — ABNORMAL LOW (ref 4.0–10.5)
nRBC: 0 % (ref 0.0–0.2)

## 2021-08-07 LAB — BASIC METABOLIC PANEL
Anion gap: 9 (ref 5–15)
BUN: 22 mg/dL (ref 8–23)
CO2: 28 mmol/L (ref 22–32)
Calcium: 8.9 mg/dL (ref 8.9–10.3)
Chloride: 104 mmol/L (ref 98–111)
Creatinine, Ser: 0.86 mg/dL (ref 0.61–1.24)
GFR, Estimated: >60 mL/min (ref >60)
Glucose, Bld: 106 mg/dL — ABNORMAL HIGH (ref 70–99)
Potassium: 4.2 mmol/L (ref 3.5–5.1)
Sodium: 141 mmol/L (ref 135–145)

## 2021-08-07 LAB — GLUCOSE, CAPILLARY
Glucose-Capillary: 86 mg/dL (ref 70–99)
Glucose-Capillary: 98 mg/dL (ref 70–99)

## 2021-08-07 MED ORDER — PHENOL 1.4 % MT LIQD
1.0000 | OROMUCOSAL | Status: DC | PRN
  Administered 2021-08-12: 1 via OROMUCOSAL
  Filled 2021-08-07: qty 177

## 2021-08-07 NOTE — Progress Notes (Signed)
Progress Note     Subjective: Still bloated this am but minimal abdominal pain. NGT is bothersome in nose. No flatus and no BM for several days. Minimally ambulatory   Objective: Vital signs in last 24 hours: Temp:  [97.9 F (36.6 C)-98.4 F (36.9 C)] 98.3 F (36.8 C) (11/07 0526) Pulse Rate:  [68-78] 74 (11/07 0526) Resp:  [18-19] 18 (11/07 0526) BP: (144-150)/(92-99) 144/92 (11/07 0526) SpO2:  [98 %-100 %] 99 % (11/07 0526) Last BM Date: 08/03/21  Intake/Output from previous day: 11/06 0701 - 11/07 0700 In: 1247.3 [I.V.:944.1; IV Piggyback:303.2] Out: 1150 [Urine:500; Emesis/NG output:650] Intake/Output this shift: No intake/output data recorded.  PE: General: pleasant, WD, male who is laying in bed in NAD HEENT: head is normocephalic, atraumatic.  Mouth is pink and moist Heart: regular, rate, and rhythm.Palpable radial pulses bilaterally Lungs: CTAB. Respiratory effort nonlabored Abd: soft, minimal diffuse TTP without rebound or guarding. Mildly distended. Hypoactive BS. NGT with dark green clear bilious output,  MSK: all 4 extremities are symmetrical with no cyanosis, clubbing, or edema. No calf TTP bilaterally Skin: warm and dry with no masses, lesions, or rashes Psych: A&Ox3 with an appropriate affect.    Lab Results:  Recent Labs    08/06/21 1553 08/07/21 0016  WBC 2.5* 2.7*  HGB 14.2 14.5  HCT 43.3 44.6  PLT 167 166   BMET Recent Labs    08/06/21 0814 08/07/21 0016  NA 140 141  K 4.2 4.2  CL 104 104  CO2 26 28  GLUCOSE 104* 106*  BUN 29* 22  CREATININE 0.88 0.86  CALCIUM 8.8* 8.9   PT/INR No results for input(s): LABPROT, INR in the last 72 hours. CMP     Component Value Date/Time   NA 141 08/07/2021 0016   NA 141 11/15/2017 1054   K 4.2 08/07/2021 0016   CL 104 08/07/2021 0016   CO2 28 08/07/2021 0016   GLUCOSE 106 (H) 08/07/2021 0016   BUN 22 08/07/2021 0016   BUN 13 11/15/2017 1054   CREATININE 0.86 08/07/2021 0016   CREATININE  0.84 09/28/2020 0956   CALCIUM 8.9 08/07/2021 0016   PROT 6.8 08/04/2021 0214   PROT 6.5 09/22/2015 0949   ALBUMIN 3.6 08/04/2021 0214   ALBUMIN 3.9 09/22/2015 0949   AST 30 08/04/2021 0214   ALT 17 08/04/2021 0214   ALKPHOS 34 (L) 08/04/2021 0214   BILITOT 0.6 08/04/2021 0214   BILITOT 0.8 09/22/2015 0949   GFRNONAA >60 08/07/2021 0016   GFRNONAA 90 09/28/2020 0956   GFRAA 104 09/28/2020 0956   Lipase     Component Value Date/Time   LIPASE 24 08/03/2021 1045       Studies/Results: DG Abd Portable 1V-Small Bowel Obstruction Protocol-initial, 8 hr delay  Result Date: 08/07/2021 CLINICAL DATA:  Follow up small bowel obstruction EXAM: PORTABLE ABDOMEN - 1 VIEW COMPARISON:  Film from the previous day. FINDINGS: Persistent small bowel dilatation is noted. Gastric catheter is noted within the stomach. Contrast material administered previously is again identified throughout the small bowel. No definitive colonic contrast is seen. No free air is noted. IMPRESSION: Gastric catheter within the stomach. Persistent small bowel dilatation with contrast throughout the small bowel consistent with high-grade obstruction. Electronically Signed   By: Alcide Clever M.D.   On: 08/07/2021 03:18   DG Abd Portable 1V-Small Bowel Protocol-Position Verification  Result Date: 08/06/2021 CLINICAL DATA:  Check gastric catheter placement EXAM: PORTABLE ABDOMEN - 1 VIEW COMPARISON:  None from earlier  in the same day. FINDINGS: Gastric catheter is now seen within the stomach. The proximal side port lies at the gastroesophageal junction. This could be advanced further into the stomach. Small-bowel dilatation is noted consistent with known obstruction. The overall appearance is stable from the prior exam. No free air is seen. IMPRESSION: Gastric catheter as described with persistent small bowel obstruction. No free air is noted. Electronically Signed   By: Inez Catalina M.D.   On: 08/06/2021 19:12   DG Abd Portable  1V  Result Date: 08/06/2021 CLINICAL DATA:  Abdominal pain. EXAM: PORTABLE ABDOMEN - 1 VIEW COMPARISON:  August 04, 2021 FINDINGS: Persistent abnormal distension of small bowel loops. The colon is mostly decompressed with pockets of residual gas. No definite evidence of free intraperitoneal gas IMPRESSION: Persistent small bowel obstruction. Electronically Signed   By: Fidela Salisbury M.D.   On: 08/06/2021 09:54    Anti-infectives: Anti-infectives (From admission, onward)    Start     Dose/Rate Route Frequency Ordered Stop   08/04/21 2200  oseltamivir (TAMIFLU) capsule 30 mg        30 mg Oral 2 times daily 08/04/21 0822 08/09/21 0959   08/04/21 1000  oseltamivir (TAMIFLU) capsule 75 mg        75 mg Oral  Once 08/04/21 0825 08/04/21 1116   08/03/21 1645  cefTRIAXone (ROCEPHIN) 2 g in sodium chloride 0.9 % 100 mL IVPB  Status:  Discontinued        2 g 200 mL/hr over 30 Minutes Intravenous Every 24 hours 08/03/21 1631 08/06/21 1519   08/03/21 1645  metroNIDAZOLE (FLAGYL) IVPB 500 mg  Status:  Discontinued        500 mg 100 mL/hr over 60 Minutes Intravenous Every 12 hours 08/03/21 1631 08/06/21 1519        Assessment/Plan  Abdominal pain and diarrhea ?Partial SBO vs ileus vs enteritis - Patient with 2-3 days of suprapubic and LLQ abdominal pain and diarrhea. CT scan reports dilated fluid and air-filled small intestine with a transition zone in the mid to distal ileum consistent with small bowel obstruction.  - no prior abdominal surgeries. - SBO protocol stated - NGT output 650 mL - 8 hour xray with persistent SB dilatation with contrast in small bowel - continue NPO and NGT LIWS. Pending improvement with protocol consider dx lap vs laparotomy pending improvement - will discuss with MD - ambulate - can clamp NGT to ambulate - okay for ice chips  Check 24 hour film this pm   ID - none VTE - SCDs, lovenox FEN - NPO, NGT LIWS Foley - none   AKI HTN HLD COPD CHF   LOS: 3  days    Winferd Humphrey, [facility] 08/07/2021, 7:40 AM Please see Amion for pager number during day hours 7:00am-4:30pm

## 2021-08-07 NOTE — Progress Notes (Signed)
PROGRESS NOTE  [name] [id number] DOB: 05-Apr-1951 DOA: 08/03/2021 PCP: Lauree Chandler, NP  HPI/Recap of past 24 hours: [name] is a 70 y.o. male with h/o hypertension, chronic systolic CHF- EF 0000000, GERD, BPH, prior history of alcoholism presents with complaints of lower abdominal pain and diarrhea.  Work-up revealed influenza A infection and small bowel obstruction seen on CT abdomen and pelvis with contrast done on 08/03/21.  General surgery consulted and following.  Patient was started on Tamiflu for Influenza, and received 4 days of Rocephin and IV Flagyl due to concern for enteritis.  Antibiotics were discontinued as he was no longer having stools.  NG tube and SBO protocol were applied on 08/06/2021.    08/07/2021: Patient was seen and examined at bedside.  Reports persistent abdominal pain.  NG tube in place, 650 output recorded.  Denies flatulence this morning.  He is unsure whether or not he had flatulence last night.  No BM.   Assessment/Plan: Principal Problem:   Enteritis Active Problems:   Alcohol abuse   Diarrhea   Chronic systolic CHF (congestive heart failure) (HCC)   Cocaine use disorder, moderate, dependence (HCC)   Benign prostatic hyperplasia with weak urinary stream   AKI (acute kidney injury) ([place])  Persistent SBO  Abdominal x-ray done this morning showed persistent SBO with contrast in the small intestine. General surgery following, abdominal x-ray will be repeated this p.m. Continue to optimize magnesium and potassium levels Goal magnesium greater than 2.0, goal potassium greater than 4.0. Continue IV fluid hydration Continue to mobilize as tolerated  Influenza A viral infection Continue Tamiflu 30 mg twice daily x5 days, day#4 Continue bronchodilators as needed  Leukopenia/acute thrombocytopenia, likely in the setting of acute illness Thrombocytopenia has resolved WBC uptrending 2.7 from 2.2. Continue to monitor  HFREF 20-25%  from 2D echo 12/12/15 Closely monitor volume status while on IV fluid hydration in the setting of SBO. Resume cardiac medications once no longer NPO. Strict I's and O's and daily weight  Resolved AKI, suspect prerenal in setting of poor oral intake Baseline creatinine appears to be 0.75 with GFR greater than 60 Presented with creatinine of 1.59 with GFR 47 Creatinine is downtrending 1.08 with GFR>60 Continue to avoid nephrotoxic agents, dehydration and hypotension. Continue IV fluid hydration and monitor urine output.  Hyperlipidemia Continue home Crestor  Generalized weakness/physical debility PT OT assessed and they have no further recommendations Continue Fall precautions Continue to mobilize.    Code Status: Full code  Family Communication: None at bedside  Disposition Plan: Likely will discharge to home once SBO has resolved.   Consultants: General surgery  Procedures: NG tube placement on 08/06/2021.  Antimicrobials: Rocephin/ IV Flagyl  DVT prophylaxis: Subcutaneous Lovenox daily  Status is: Inpatient  Inpatient status.  Patient will require at least 2 midnights for further evaluation and treatment of present condition.      Objective: Vitals:   08/06/21 1831 08/06/21 1932 08/07/21 0526 08/07/21 0803  BP: (!) 146/99 (!) 150/95 (!) 144/92 (!) 152/90  Pulse: 78 77 74 70  Resp: 18 18 18 16   Temp: 98.2 F (36.8 C) 98.4 F (36.9 C) 98.3 F (36.8 C) 98.6 F (37 C)  TempSrc: Oral Oral Oral Oral  SpO2: 98% 99% 99% 97%  Weight:      Height:        Intake/Output Summary (Last 24 hours) at 08/07/2021 1539 Last data filed at 08/07/2021 1500 Gross per 24 hour  Intake 1279.97 ml  Output 1150 ml  Net 129.97 ml   Filed Weights   08/03/21 1038  Weight: 81.6 kg    Exam:  General: 69 y.o. year-old male well-developed well-nourished in no acute distress.  He is alert and interactive.  NG tube in place. Cardiovascular: Regular rate and rhythm with no  rubs or gallops Respiratory: Clear to auscultation with no wheezes or rales.  Abdomen: Mildly distended.  Hypoactive bowel sounds.    Musculoskeletal: No lower extremity edema bilaterally.   Skin: No ulcerative lesions noted. Psychiatry: Mood is appropriate for condition and setting.  Neuro: Alert and awake, moves all 4 extremities.  Data Reviewed: CBC: Recent Labs  Lab 08/03/21 1045 08/03/21 1707 08/04/21 0214 08/06/21 0814 08/06/21 1553 08/07/21 0016  WBC 4.7 3.9* 4.0 2.2* 2.5* 2.7*  NEUTROABS 3.2  --   --  1.3*  --   --   HGB 14.7 14.3 13.9 15.0 14.2 14.5  HCT 42.8 42.8 42.8 46.0 43.3 44.6  MCV 95.7 97.1 99.3 97.9 97.5 98.2  PLT 166 168 157 105* 167 166   Basic Metabolic Panel: Recent Labs  Lab 08/03/21 1045 08/03/21 1707 08/04/21 0214 08/05/21 1315 08/06/21 0814 08/07/21 0016  NA 138  --  138 139 140 141  K 3.9  --  3.8 4.0 4.2 4.2  CL 102  --  103 104 104 104  CO2 26  --  24 27 26 28   GLUCOSE 110*  --  94 125* 104* 106*  BUN 34*  --  34* 39* 29* 22  CREATININE 1.59* 1.31* 1.35* 1.08 0.88 0.86  CALCIUM 9.5  --  8.8* 8.8* 8.8* 8.9  MG  --   --   --  2.6*  --   --    GFR: Estimated Creatinine Clearance: 89 mL/min (by C-G formula based on SCr of 0.86 mg/dL). Liver Function Tests: Recent Labs  Lab 08/03/21 1045 08/04/21 0214  AST 32 30  ALT 17 17  ALKPHOS 42 34*  BILITOT 0.8 0.6  PROT 7.8 6.8  ALBUMIN 4.4 3.6   Recent Labs  Lab 08/03/21 1045  LIPASE 24   No results for input(s): AMMONIA in the last 168 hours. Coagulation Profile: No results for input(s): INR, PROTIME in the last 168 hours. Cardiac Enzymes: No results for input(s): CKTOTAL, CKMB, CKMBINDEX, TROPONINI in the last 168 hours. BNP (last 3 results) No results for input(s): PROBNP in the last 8760 hours. HbA1C: No results for input(s): HGBA1C in the last 72 hours. CBG: Recent Labs  Lab 08/05/21 2026 08/06/21 1243 08/06/21 1826 08/06/21 2111 08/07/21 0800  GLUCAP 132* 94 105* 97  86   Lipid Profile: No results for input(s): CHOL, HDL, LDLCALC, TRIG, CHOLHDL, LDLDIRECT in the last 72 hours. Thyroid Function Tests: No results for input(s): TSH, T4TOTAL, FREET4, T3FREE, THYROIDAB in the last 72 hours. Anemia Panel: No results for input(s): VITAMINB12, FOLATE, FERRITIN, TIBC, IRON, RETICCTPCT in the last 72 hours. Urine analysis:    Component Value Date/Time   COLORURINE YELLOW 12/06/2007 2258   APPEARANCEUR Cloudy (A) 10/28/2015 1615   LABSPEC 1.020 05/08/2011 1016   PHURINE 5.5 05/08/2011 1016   GLUCOSEU Negative 10/28/2015 1615   HGBUR SMALL (A) 05/08/2011 1016   BILIRUBINUR Negative 10/28/2015 1615   KETONESUR NEGATIVE 05/08/2011 1016   PROTEINUR Trace 10/28/2015 1615   PROTEINUR NEGATIVE 05/08/2011 1016   UROBILINOGEN 0.2 05/08/2011 1016   NITRITE Positive (A) 10/28/2015 1615   NITRITE NEGATIVE 05/08/2011 1016   LEUKOCYTESUR Negative 10/28/2015 1615  Sepsis Labs: @LABRCNTIP (procalcitonin:4,lacticidven:4)  ) Recent Results (from the past 240 hour(s))  Resp Panel by RT-PCR (Flu A&B, Covid) Nasopharyngeal Swab     Status: Abnormal   Collection Time: 08/03/21 12:31 PM   Specimen: Nasopharyngeal Swab; Nasopharyngeal(NP) swabs in vial transport medium  Result Value Ref Range Status   SARS Coronavirus 2 by RT PCR NEGATIVE NEGATIVE Final    Comment: (NOTE) SARS-CoV-2 target nucleic acids are NOT DETECTED.  The SARS-CoV-2 RNA is generally detectable in upper respiratory specimens during the acute phase of infection. The lowest concentration of SARS-CoV-2 viral copies this assay can detect is 138 copies/mL. A negative result does not preclude SARS-Cov-2 infection and should not be used as the sole basis for treatment or other patient management decisions. A negative result may occur with  improper specimen collection/handling, submission of specimen other than nasopharyngeal swab, presence of viral mutation(s) within the areas targeted by this assay,  and inadequate number of viral copies(<138 copies/mL). A negative result must be combined with clinical observations, patient history, and epidemiological information. The expected result is Negative.  Fact Sheet for Patients:  EntrepreneurPulse.com.au  Fact Sheet for Healthcare Providers:  IncredibleEmployment.be  This test is no t yet approved or cleared by the [place] FDA and  has been authorized for detection and/or diagnosis of SARS-CoV-2 by FDA under an Emergency Use Authorization (EUA). This EUA will remain  in effect (meaning this test can be used) for the duration of the COVID-19 declaration under Section 564(b)(1) of the Act, 21 U.S.C.section 360bbb-3(b)(1), unless the authorization is terminated  or revoked sooner.       Influenza A by PCR POSITIVE (A) NEGATIVE Final   Influenza B by PCR NEGATIVE NEGATIVE Final    Comment: (NOTE) The Xpert Xpress SARS-CoV-2/FLU/RSV plus assay is intended as an aid in the diagnosis of influenza from Nasopharyngeal swab specimens and should not be used as a sole basis for treatment. Nasal washings and aspirates are unacceptable for Xpert Xpress SARS-CoV-2/FLU/RSV testing.  Fact Sheet for Patients: EntrepreneurPulse.com.au  Fact Sheet for Healthcare Providers: IncredibleEmployment.be  This test is not yet approved or cleared by the [place] FDA and has been authorized for detection and/or diagnosis of SARS-CoV-2 by FDA under an Emergency Use Authorization (EUA). This EUA will remain in effect (meaning this test can be used) for the duration of the COVID-19 declaration under Section 564(b)(1) of the Act, 21 U.S.C. section 360bbb-3(b)(1), unless the authorization is terminated or revoked.  Performed at [facility], [place] [address], [place], [place] [address]       Studies: DG Abd Portable 1V  Result Date: 08/07/2021 CLINICAL DATA:   Abdominal distention EXAM: PORTABLE ABDOMEN - 1 VIEW COMPARISON:  Previous studies including the examination done earlier today FINDINGS: Tip of enteric tube is seen in the stomach. There is gaseous distention of small-bowel loops. Gas-filled small bowel loops measure up to 4.9 cm in diameter. There is increased density in the lumen of colon, possibly oral contrast or fluid in the lumen. IMPRESSION: There is abnormal dilation of small-bowel loops suggesting small-bowel obstruction with no significant interval change. Electronically Signed   By: Elmer Picker M.D.   On: 08/07/2021 10:45   DG Abd Portable 1V-Small Bowel Obstruction Protocol-initial, 8 hr delay  Result Date: 08/07/2021 CLINICAL DATA:  Follow up small bowel obstruction EXAM: PORTABLE ABDOMEN - 1 VIEW COMPARISON:  Film from the previous day. FINDINGS: Persistent small bowel dilatation is noted. Gastric catheter is noted within the stomach.  Contrast material administered previously is again identified throughout the small bowel. No definitive colonic contrast is seen. No free air is noted. IMPRESSION: Gastric catheter within the stomach. Persistent small bowel dilatation with contrast throughout the small bowel consistent with high-grade obstruction. Electronically Signed   By: Inez Catalina M.D.   On: 08/07/2021 03:18   DG Abd Portable 1V-Small Bowel Protocol-Position Verification  Result Date: 08/06/2021 CLINICAL DATA:  Check gastric catheter placement EXAM: PORTABLE ABDOMEN - 1 VIEW COMPARISON:  None from earlier in the same day. FINDINGS: Gastric catheter is now seen within the stomach. The proximal side port lies at the gastroesophageal junction. This could be advanced further into the stomach. Small-bowel dilatation is noted consistent with known obstruction. The overall appearance is stable from the prior exam. No free air is seen. IMPRESSION: Gastric catheter as described with persistent small bowel obstruction. No free air is noted.  Electronically Signed   By: Inez Catalina M.D.   On: 08/06/2021 19:12    Scheduled Meds:  aspirin EC  81 mg Oral Daily   bisacodyl  10 mg Rectal Daily   enoxaparin (LOVENOX) injection  40 mg Subcutaneous Q24H   influenza vaccine adjuvanted  0.5 mL Intramuscular Tomorrow-1000   oseltamivir  30 mg Oral BID   rosuvastatin  5 mg Oral Daily   simethicone  80 mg Oral QID    Continuous Infusions:  lactated ringers 75 mL/hr at 08/07/21 0648     LOS: 3 days     Kayleen Memos, MD [facility] Pager [phone]  If 7PM-7AM, please contact night-coverage [email or web address] Password [facility] 08/07/2021, 3:39 PM

## 2021-08-07 NOTE — Progress Notes (Signed)
Physical Therapy Treatment Patient Details Name: [name] MRN: [id number] DOB: 12-13-1950 Today's Date: 08/07/2021   History of Present Illness Patient is a 70 y/o male who presents on 08/03/21 due to abdominal pain and diarrhea. CT abdomen/pelvis- dilated fluid and air-filled small intestine consistent with small bowel obstruction. Per surgery, ilieus vs enteritis vs partial SBO.  PMH includes HTN, chronic systolic CHF- EF 23%, prior history of alcoholism    PT Comments    Pt was seen for mobility on RW with assistance to get to stand but then after IV leaked a drip of watery fluid he quickly decided to return to bed.  Talked with him about the issue being common, and asked nursing to check.  She was comfortable with it, and pt decided to stay in bed.  Did review strengthening exercises to BLE's to work on mobility skills, and will encourage OOB to chair next visit.  Pt's family is there and are partially supportive of moving but also concerned that the IV is not working.  Pt is also on NG tube with limits for the suction of the tube right now.  Will follow along with him and encourage him to move as he can.     Recommendations for follow up therapy are one component of a multi-disciplinary discharge planning process, led by the attending physician.  Recommendations may be updated based on patient status, additional functional criteria and insurance authorization.  Follow Up Recommendations  No PT follow up     Assistance Recommended at Discharge Intermittent Supervision/Assistance  Equipment Recommendations  Rolling walker (2 wheels)    Recommendations for Other Services       Precautions / Restrictions Precautions Precautions: Fall Precaution Comments: monitor IV for pt Restrictions Weight Bearing Restrictions: No     Mobility  Bed Mobility Overal bed mobility: Needs Assistance Bed Mobility: Sidelying to Sit;Sit to Sidelying   Sidelying to sit: Min guard     Sit to  sidelying: Min guard General bed mobility comments: min guard for management of lines including NG tube    Transfers Overall transfer level: Needs assistance Equipment used: Rolling walker (2 wheels) Transfers: Sit to/from Stand Sit to Stand: Min guard           General transfer comment: min guard for lines including NG tube    Ambulation/Gait                   Stairs             Wheelchair Mobility    Modified Rankin (Stroke Patients Only)       Balance Overall balance assessment: Needs assistance;History of Falls Sitting-balance support: Feet supported Sitting balance-Leahy Scale: Good     Standing balance support: Bilateral upper extremity supported;During functional activity Standing balance-Leahy Scale: Fair Standing balance comment: using RW but stopped to get back to bed due to IV dripping a bit with watery blood                            Cognition Arousal/Alertness: Awake/alert Behavior During Therapy: Flat affect Overall Cognitive Status: Difficult to assess                                 General Comments: difficult to understand and family is talking over him        Exercises General Exercises - Lower Extremity Ankle  Circles/Pumps: AAROM;5 reps Quad Sets: AROM;10 reps Gluteal Sets: AROM;10 reps Heel Slides: AROM;10 reps Hip ABduction/ADduction: AROM;10 reps    General Comments General comments (skin integrity, edema, etc.): Pt was seen for mobility on RW with standing on side of bed until he suddenly noticed his IV had leaked a bit.  Quickly returned to bed and reported that he would not stand any more.  Nsg in to look at it      Pertinent Vitals/Pain Pain Assessment: No/denies pain Pain Score: 0-No pain    Home Living                          Prior Function            PT Goals (current goals can now be found in the care plan section) Acute Rehab PT Goals Patient Stated Goal:  improve pain, get back to work Progress towards PT goals: Not progressing toward goals - comment    Frequency    Min 3X/week      PT Plan Current plan remains appropriate    Co-evaluation              AM-PAC PT "6 Clicks" Mobility   Outcome Measure  Help needed turning from your back to your side while in a flat bed without using bedrails?: None Help needed moving from lying on your back to sitting on the side of a flat bed without using bedrails?: None Help needed moving to and from a bed to a chair (including a wheelchair)?: A Little Help needed standing up from a chair using your arms (e.g., wheelchair or bedside chair)?: A Little Help needed to walk in hospital room?: A Little Help needed climbing 3-5 steps with a railing? : A Little 6 Click Score: 20    End of Session   Activity Tolerance: Patient tolerated treatment well Patient left: in bed;with call bell/phone within reach;with bed alarm set;with family/visitor present Nurse Communication: Mobility status PT Visit Diagnosis: Muscle weakness (generalized) (M62.81)     Time: 4196-2229 PT Time Calculation (min) (ACUTE ONLY): 34 min  Charges:  $Therapeutic Exercise: 8-22 mins $Therapeutic Activity: 8-22 mins               Ivar Drape 08/07/2021, 1:17 PM  Samul Dada, PT PhD Acute Rehab Dept. Number: [facility] [id number] and [facility] [phone]

## 2021-08-07 NOTE — Progress Notes (Signed)
Occupational Therapy Treatment Patient Details Name: [name] MRN: [id number] DOB: Jan 31, 1951 Today's Date: 08/07/2021   History of present illness Patient is a 70 y/o male who presents on 08/03/21 due to abdominal pain and diarrhea. CT abdomen/pelvis- dilated fluid and air-filled small intestine consistent with small bowel obstruction. Per surgery, ilieus vs enteritis vs partial SBO.  PMH includes HTN, chronic systolic CHF- EF 01%, prior history of alcoholism   OT comments  Pt is progressing towards OT goals. Pt remains limited by pain and balance. During session, pt completed LB dressing with supervision and toilet transfer with Min guard. Encouraged to stay up in chair at end of session, however declining due to abdominal pain. Will continue to follow acutely.   Recommendations for follow up therapy are one component of a multi-disciplinary discharge planning process, led by the attending physician.  Recommendations may be updated based on patient status, additional functional criteria and insurance authorization.    Follow Up Recommendations  No OT follow up    Assistance Recommended at Discharge Intermittent Supervision/Assistance  Equipment Recommendations  None recommended by OT    Recommendations for Other Services      Precautions / Restrictions Precautions Precautions: Fall Precaution Comments: monitor IV for pt Restrictions Weight Bearing Restrictions: No       Mobility Bed Mobility Overal bed mobility: Needs Assistance Bed Mobility: Supine to Sit;Sit to Supine     Supine to sit: Min guard Sit to supine: Min guard        Transfers Overall transfer level: Needs assistance Equipment used: Rolling walker (2 wheels) Transfers: Sit to/from Stand Sit to Stand: Min guard           General transfer comment: min guard for lines including NG tube     Balance Overall balance assessment: Needs assistance;History of Falls Sitting-balance support: Feet  supported       Standing balance support: During functional activity Standing balance-Leahy Scale: Fair Standing balance comment: Able to let go of walker with both hands to pull pants over hips in standing position                           ADL either performed or assessed with clinical judgement   ADL Overall ADL's : Needs assistance/impaired                     Lower Body Dressing: Supervision/safety;Sit to/from stand Lower Body Dressing Details (indicate cue type and reason): Donned/doffed jeans and socks at EOB with sit to stand method. Supervision for safety when standing. Toilet Transfer: Min guard;Ambulation;BSC/3in1;Rolling walker (2 wheels)           Functional mobility during ADLs: Min guard;Rolling walker (2 wheels) General ADL Comments: Pt limited by pain, balance, and activity tolerance.    Extremity/Trunk Assessment              Vision       Perception     Praxis      Cognition Arousal/Alertness: Awake/alert Behavior During Therapy: Flat affect Overall Cognitive Status: Difficult to assess                                            Exercises     Shoulder Instructions       General Comments Encouraged pt to sit up in chair at  end of session, however pt declining due to pain. Assisted back to bed.    Pertinent Vitals/ Pain       Pain Assessment: 0-10 Pain Score: 7  Pain Location: abdomen Pain Descriptors / Indicators: Sore;Aching Pain Intervention(s): Limited activity within patient's tolerance;Monitored during session  Home Living                                          Prior Functioning/Environment              Frequency  Min 2X/week        Progress Toward Goals  OT Goals(current goals can now be found in the care plan section)  Progress towards OT goals: Progressing toward goals  Acute Rehab OT Goals Patient Stated Goal: return home OT Goal Formulation: With  patient Time For Goal Achievement: 08/19/21 Potential to Achieve Goals: Good ADL Goals Pt Will Perform Grooming: with modified independence;standing Pt Will Perform Lower Body Bathing: with modified independence Pt Will Perform Lower Body Dressing: with modified independence;sit to/from stand Pt Will Transfer to Toilet: with modified independence;ambulating;regular height toilet Pt Will Perform Tub/Shower Transfer: Tub transfer;with modified independence;ambulating;3 in 1  Plan Discharge plan remains appropriate;Frequency remains appropriate    Co-evaluation                 AM-PAC OT "6 Clicks" Daily Activity     Outcome Measure   Help from another person eating meals?: None Help from another person taking care of personal grooming?: A Little Help from another person toileting, which includes using toliet, bedpan, or urinal?: A Little Help from another person bathing (including washing, rinsing, drying)?: A Little Help from another person to put on and taking off regular upper body clothing?: None Help from another person to put on and taking off regular lower body clothing?: A Little 6 Click Score: 20    End of Session Equipment Utilized During Treatment: Rolling walker (2 wheels)  OT Visit Diagnosis: Unsteadiness on feet (R26.81);History of falling (Z91.81);Pain   Activity Tolerance Patient limited by pain   Patient Left in bed;with call bell/phone within reach;with bed alarm set   Nurse Communication Mobility status        Time: 7322-0254 OT Time Calculation (min): 17 min  Charges: OT General Charges $OT Visit: 1 Visit OT Treatments $Self Care/Home Management : 8-22 mins  Sheriden Archibeque C, OT/L  Acute Rehab [phone]  [name] 08/07/2021, 4:12 PM

## 2021-08-08 ENCOUNTER — Inpatient Hospital Stay (HOSPITAL_COMMUNITY): Payer: Medicare HMO

## 2021-08-08 DIAGNOSIS — K529 Noninfective gastroenteritis and colitis, unspecified: Secondary | ICD-10-CM | POA: Diagnosis not present

## 2021-08-08 LAB — CBC
HCT: 44.9 % (ref 39.0–52.0)
Hemoglobin: 15.2 g/dL (ref 13.0–17.0)
MCH: 32.5 pg (ref 26.0–34.0)
MCHC: 33.9 g/dL (ref 30.0–36.0)
MCV: 96.1 fL (ref 80.0–100.0)
Platelets: 170 10*3/uL (ref 150–400)
RBC: 4.67 MIL/uL (ref 4.22–5.81)
RDW: 11.7 % (ref 11.5–15.5)
WBC: 3.4 10*3/uL — ABNORMAL LOW (ref 4.0–10.5)
nRBC: 0 % (ref 0.0–0.2)

## 2021-08-08 LAB — GLUCOSE, CAPILLARY
Glucose-Capillary: 104 mg/dL — ABNORMAL HIGH (ref 70–99)
Glucose-Capillary: 96 mg/dL (ref 70–99)

## 2021-08-08 LAB — BASIC METABOLIC PANEL
Anion gap: 11 (ref 5–15)
BUN: 18 mg/dL (ref 8–23)
CO2: 25 mmol/L (ref 22–32)
Calcium: 8.9 mg/dL (ref 8.9–10.3)
Chloride: 104 mmol/L (ref 98–111)
Creatinine, Ser: 0.77 mg/dL (ref 0.61–1.24)
GFR, Estimated: >60 mL/min (ref >60)
Glucose, Bld: 100 mg/dL — ABNORMAL HIGH (ref 70–99)
Potassium: 4.1 mmol/L (ref 3.5–5.1)
Sodium: 140 mmol/L (ref 135–145)

## 2021-08-08 NOTE — Progress Notes (Signed)
Progress Note     Subjective: No flatus or BM. Bloated with minimal abdominal pain similar to yesterday. He denies nausea or emesis. He did not ambulate in hallway yesterday but states he plans to today  Objective: Vital signs in last 24 hours: Temp:  [97.5 F (36.4 C)-98.6 F (37 C)] 97.6 F (36.4 C) (11/08 0745) Pulse Rate:  [65-75] 72 (11/08 0745) Resp:  [16-19] 18 (11/08 0745) BP: (135-152)/(86-97) 142/97 (11/08 0745) SpO2:  [97 %-99 %] 99 % (11/08 0745) Last BM Date: 08/03/21  Intake/Output from previous day: 11/07 0701 - 11/08 0700 In: 1401.7 [I.V.:801.7; NG/GT:600] Out: 1425 [Urine:425; Emesis/NG output:1000] Intake/Output this shift: No intake/output data recorded.  PE: General: pleasant, WD, male who is laying in bed in NAD HEENT: head is normocephalic, atraumatic.  Mouth is pink and moist Heart: regular, rate, and rhythm.Palpable radial pulses bilaterally Lungs: CTAB. Respiratory effort nonlabored Abd: soft, minimal diffuse TTP without rebound or guarding. Mildly distended. Hypoactive BS. NGT with dark green clear bilious output  MSK: all 4 extremities are symmetrical with no cyanosis, clubbing, or edema. No calf TTP bilaterally Skin: warm and dry with no masses, lesions, or rashes Psych: A&Ox3 with an appropriate affect.    Lab Results:  Recent Labs    08/06/21 1553 08/07/21 0016  WBC 2.5* 2.7*  HGB 14.2 14.5  HCT 43.3 44.6  PLT 167 166    BMET Recent Labs    08/07/21 0016 08/08/21 0142  NA 141 140  K 4.2 4.1  CL 104 104  CO2 28 25  GLUCOSE 106* 100*  BUN 22 18  CREATININE 0.86 0.77  CALCIUM 8.9 8.9    PT/INR No results for input(s): LABPROT, INR in the last 72 hours. CMP     Component Value Date/Time   NA 140 08/08/2021 0142   NA 141 11/15/2017 1054   K 4.1 08/08/2021 0142   CL 104 08/08/2021 0142   CO2 25 08/08/2021 0142   GLUCOSE 100 (H) 08/08/2021 0142   BUN 18 08/08/2021 0142   BUN 13 11/15/2017 1054   CREATININE 0.77  08/08/2021 0142   CREATININE 0.84 09/28/2020 0956   CALCIUM 8.9 08/08/2021 0142   PROT 6.8 08/04/2021 0214   PROT 6.5 09/22/2015 0949   ALBUMIN 3.6 08/04/2021 0214   ALBUMIN 3.9 09/22/2015 0949   AST 30 08/04/2021 0214   ALT 17 08/04/2021 0214   ALKPHOS 34 (L) 08/04/2021 0214   BILITOT 0.6 08/04/2021 0214   BILITOT 0.8 09/22/2015 0949   GFRNONAA >60 08/08/2021 0142   GFRNONAA 90 09/28/2020 0956   GFRAA 104 09/28/2020 0956   Lipase     Component Value Date/Time   LIPASE 24 08/03/2021 1045       Studies/Results: DG Abd Portable 1V  Result Date: 08/07/2021 CLINICAL DATA:  Abdominal distention EXAM: PORTABLE ABDOMEN - 1 VIEW COMPARISON:  Previous studies including the examination done earlier today FINDINGS: Tip of enteric tube is seen in the stomach. There is gaseous distention of small-bowel loops. Gas-filled small bowel loops measure up to 4.9 cm in diameter. There is increased density in the lumen of colon, possibly oral contrast or fluid in the lumen. IMPRESSION: There is abnormal dilation of small-bowel loops suggesting small-bowel obstruction with no significant interval change. Electronically Signed   By: Elmer Picker M.D.   On: 08/07/2021 10:45   DG Abd Portable 1V-Small Bowel Obstruction Protocol-initial, 8 hr delay  Result Date: 08/07/2021 CLINICAL DATA:  Follow up small bowel obstruction EXAM:  PORTABLE ABDOMEN - 1 VIEW COMPARISON:  Film from the previous day. FINDINGS: Persistent small bowel dilatation is noted. Gastric catheter is noted within the stomach. Contrast material administered previously is again identified throughout the small bowel. No definitive colonic contrast is seen. No free air is noted. IMPRESSION: Gastric catheter within the stomach. Persistent small bowel dilatation with contrast throughout the small bowel consistent with high-grade obstruction. Electronically Signed   By: Inez Catalina M.D.   On: 08/07/2021 03:18   DG Abd Portable 1V-Small Bowel  Protocol-Position Verification  Result Date: 08/06/2021 CLINICAL DATA:  Check gastric catheter placement EXAM: PORTABLE ABDOMEN - 1 VIEW COMPARISON:  None from earlier in the same day. FINDINGS: Gastric catheter is now seen within the stomach. The proximal side port lies at the gastroesophageal junction. This could be advanced further into the stomach. Small-bowel dilatation is noted consistent with known obstruction. The overall appearance is stable from the prior exam. No free air is seen. IMPRESSION: Gastric catheter as described with persistent small bowel obstruction. No free air is noted. Electronically Signed   By: Inez Catalina M.D.   On: 08/06/2021 19:12   DG Abd Portable 1V  Result Date: 08/06/2021 CLINICAL DATA:  Abdominal pain. EXAM: PORTABLE ABDOMEN - 1 VIEW COMPARISON:  August 04, 2021 FINDINGS: Persistent abnormal distension of small bowel loops. The colon is mostly decompressed with pockets of residual gas. No definite evidence of free intraperitoneal gas IMPRESSION: Persistent small bowel obstruction. Electronically Signed   By: Fidela Salisbury M.D.   On: 08/06/2021 09:54    Anti-infectives: Anti-infectives (From admission, onward)    Start     Dose/Rate Route Frequency Ordered Stop   08/04/21 2200  oseltamivir (TAMIFLU) capsule 30 mg        30 mg Oral 2 times daily 08/04/21 0822 08/09/21 0959   08/04/21 1000  oseltamivir (TAMIFLU) capsule 75 mg        75 mg Oral  Once 08/04/21 0825 08/04/21 1116   08/03/21 1645  cefTRIAXone (ROCEPHIN) 2 g in sodium chloride 0.9 % 100 mL IVPB  Status:  Discontinued        2 g 200 mL/hr over 30 Minutes Intravenous Every 24 hours 08/03/21 1631 08/06/21 1519   08/03/21 1645  metroNIDAZOLE (FLAGYL) IVPB 500 mg  Status:  Discontinued        500 mg 100 mL/hr over 60 Minutes Intravenous Every 12 hours 08/03/21 1631 08/06/21 1519        Assessment/Plan  Abdominal pain and diarrhea ?Partial SBO vs ileus vs enteritis - Patient with 2-3 days  of suprapubic and LLQ abdominal pain and diarrhea. CT scan reports dilated fluid and air-filled small intestine with a transition zone in the mid to distal ileum consistent with small bowel obstruction.  - no prior abdominal surgeries. - WBC 3.4, afebrile - NGT output 1000 mL - am xray: abnormal dilation of small-bowel loops suggesting small bowel obstruction with interval worsening. - continue NPO and NGT LIWS. If no improvement today consider dx lap vs laparotomy tomorrow. Discussed with patient and he is agreeable to this plan. Discussed he may continue to have ileus and need NGT post op and he stated understanding - encouraged ambulation - can clamp NGT to ambulate - okay for ice chips   ID - none VTE - SCDs, lovenox FEN - NPO, NGT LIWS Foley - none   AKI HTN HLD COPD CHF   LOS: 4 days    Winferd Humphrey, [facility]  08/08/2021, 7:49 AM Please see Amion for pager number during day hours 7:00am-4:30pm

## 2021-08-08 NOTE — Progress Notes (Signed)
Mobility Specialist Progress Note:   08/08/21 1245  Mobility  Activity Ambulated in room  Level of Assistance Contact guard assist, steadying assist  Assistive Device Front wheel walker  Distance Ambulated (ft) 50 ft  Mobility Ambulated with assistance in room  Mobility Response Tolerated well  Mobility performed by Mobility specialist  $Mobility charge 1 Mobility   Pt c/o abdomen pain before ambulation. Asx during and after. Placed NG tube back on suction, and bed alarm on.   Addison Lank Mobility Specialist  Phone [phone]

## 2021-08-08 NOTE — Progress Notes (Signed)
PROGRESS NOTE  BOSCO PAPARELLA YYT:[id number] DOB: Feb 16, 1951 DOA: 08/03/2021 PCP: Sharon Seller, NP  HPI/Recap of past 24 hours: [name] is a 70 y.o. male with h/o hypertension, chronic systolic CHF- EF 27-51%, GERD, BPH, prior history of alcoholism presents with complaints of lower abdominal pain and diarrhea.  Work-up revealed influenza A infection and small bowel obstruction seen on CT abdomen and pelvis with contrast done on 08/03/21.  General surgery consulted and following.  Patient was started on Tamiflu for Influenza, and received 4 days of Rocephin and IV Flagyl due to concern for enteritis.  Antibiotics were discontinued as he was no longer having stools.  NG tube and SBO protocol were applied on 08/06/2021.    08/08/2021: Ongoing management of SBO.  His abdominal pain is improved at 7/10.  Advised to ambulate.  Surgery following, possible surgical intervention tomorrow.   Assessment/Plan: Principal Problem:   Enteritis Active Problems:   Alcohol abuse   Diarrhea   Chronic systolic CHF (congestive heart failure) (HCC)   Cocaine use disorder, moderate, dependence (HCC)   Benign prostatic hyperplasia with weak urinary stream   AKI (acute kidney injury) (HCC)  Persistent SBO  Abdominal x-ray done this morning showed persistent SBO with contrast in the small intestine. General surgery following, abdominal x-ray will be repeated this p.m. Continue to optimize magnesium and potassium levels Goal magnesium greater than 2.0, goal potassium greater than 4.0. Continue IV fluid hydration Continue to mobilize as tolerated  Influenza A viral infection Continue Tamiflu 30 mg twice daily x5 days, day#4 Continue bronchodilators as needed  Leukopenia/acute thrombocytopenia, likely in the setting of acute illness Thrombocytopenia has resolved WBC uptrending 2.7 from 2.2. Continue to monitor  HFREF 20-25% from 2D echo 12/12/15 Closely monitor volume status while on IV fluid  hydration in the setting of SBO. Resume cardiac medications once no longer NPO. Strict I's and O's and daily weight  Resolved AKI, suspect prerenal in setting of poor oral intake Baseline creatinine appears to be 0.75 with GFR greater than 60 Presented with creatinine of 1.59 with GFR 47 Creatinine is downtrending 1.08 with GFR>60 Continue to avoid nephrotoxic agents, dehydration and hypotension. Continue IV fluid hydration and monitor urine output.  Hyperlipidemia Continue home Crestor  Generalized weakness/physical debility PT OT assessed and they have no further recommendations Continue Fall precautions Continue to mobilize.    Code Status: Full code  Family Communication: None at bedside  Disposition Plan: Likely will discharge to home once SBO has resolved.   Consultants: General surgery  Procedures: NG tube placement on 08/06/2021.  Antimicrobials: Rocephin/ IV Flagyl  DVT prophylaxis: Subcutaneous Lovenox daily  Status is: Inpatient  Inpatient status.  Patient will require at least 2 midnights for further evaluation and treatment of present condition.      Objective: Vitals:   08/07/21 2110 08/08/21 0516 08/08/21 0745 08/08/21 1651  BP: 135/86 (!) 149/91 (!) 142/97 (!) 138/93  Pulse: 65 73 72 66  Resp: 19 19 18 16   Temp: 97.9 F (36.6 C) (!) 97.5 F (36.4 C) 97.6 F (36.4 C) 98.9 F (37.2 C)  TempSrc: Oral Oral Oral Oral  SpO2: 98% 98% 99% 99%  Weight:      Height:        Intake/Output Summary (Last 24 hours) at 08/08/2021 1858 Last data filed at 08/08/2021 1500 Gross per 24 hour  Intake 1795.77 ml  Output 2725 ml  Net -929.23 ml   Filed Weights   08/03/21 1038  Weight: 81.6 kg    Exam:  General: 70 y.o. year-old male well-developed well-nourished in no acute distress.  He is alert and interactive.  NG tube in place. Cardiovascular: Regular rate and rhythm with no rubs or gallops Respiratory: Clear to auscultation with no wheezes  or rales.  Abdomen: Mildly distended.  Hypoactive bowel sounds.    Musculoskeletal: No lower extremity edema bilaterally.   Skin: No ulcerative lesions noted. Psychiatry: Mood is appropriate for condition and setting.  Neuro: Alert and awake, moves all 4 extremities.  Data Reviewed: CBC: Recent Labs  Lab 08/03/21 1045 08/03/21 1707 08/04/21 0214 08/06/21 0814 08/06/21 1553 08/07/21 0016 08/08/21 0909  WBC 4.7   < > 4.0 2.2* 2.5* 2.7* 3.4*  NEUTROABS 3.2  --   --  1.3*  --   --   --   HGB 14.7   < > 13.9 15.0 14.2 14.5 15.2  HCT 42.8   < > 42.8 46.0 43.3 44.6 44.9  MCV 95.7   < > 99.3 97.9 97.5 98.2 96.1  PLT 166   < > 157 105* 167 166 170   < > = values in this interval not displayed.   Basic Metabolic Panel: Recent Labs  Lab 08/04/21 0214 08/05/21 1315 08/06/21 0814 08/07/21 0016 08/08/21 0142  NA 138 139 140 141 140  K 3.8 4.0 4.2 4.2 4.1  CL 103 104 104 104 104  CO2 24 27 26 28 25   GLUCOSE 94 125* 104* 106* 100*  BUN 34* 39* 29* 22 18  CREATININE 1.35* 1.08 0.88 0.86 0.77  CALCIUM 8.8* 8.8* 8.8* 8.9 8.9  MG  --  2.6*  --   --   --    GFR: Estimated Creatinine Clearance: 95.7 mL/min (by C-G formula based on SCr of 0.77 mg/dL). Liver Function Tests: Recent Labs  Lab 08/03/21 1045 08/04/21 0214  AST 32 30  ALT 17 17  ALKPHOS 42 34*  BILITOT 0.8 0.6  PROT 7.8 6.8  ALBUMIN 4.4 3.6   Recent Labs  Lab 08/03/21 1045  LIPASE 24   No results for input(s): AMMONIA in the last 168 hours. Coagulation Profile: No results for input(s): INR, PROTIME in the last 168 hours. Cardiac Enzymes: No results for input(s): CKTOTAL, CKMB, CKMBINDEX, TROPONINI in the last 168 hours. BNP (last 3 results) No results for input(s): PROBNP in the last 8760 hours. HbA1C: No results for input(s): HGBA1C in the last 72 hours. CBG: Recent Labs  Lab 08/06/21 1826 08/06/21 2111 08/07/21 0800 08/07/21 2106 08/08/21 0750  GLUCAP 105* 97 86 98 104*   Lipid Profile: No  results for input(s): CHOL, HDL, LDLCALC, TRIG, CHOLHDL, LDLDIRECT in the last 72 hours. Thyroid Function Tests: No results for input(s): TSH, T4TOTAL, FREET4, T3FREE, THYROIDAB in the last 72 hours. Anemia Panel: No results for input(s): VITAMINB12, FOLATE, FERRITIN, TIBC, IRON, RETICCTPCT in the last 72 hours. Urine analysis:    Component Value Date/Time   COLORURINE YELLOW 12/06/2007 2258   APPEARANCEUR Cloudy (A) 10/28/2015 1615   LABSPEC 1.020 05/08/2011 1016   PHURINE 5.5 05/08/2011 1016   GLUCOSEU Negative 10/28/2015 1615   HGBUR SMALL (A) 05/08/2011 1016   BILIRUBINUR Negative 10/28/2015 1615   KETONESUR NEGATIVE 05/08/2011 1016   PROTEINUR Trace 10/28/2015 1615   PROTEINUR NEGATIVE 05/08/2011 1016   UROBILINOGEN 0.2 05/08/2011 1016   NITRITE Positive (A) 10/28/2015 1615   NITRITE NEGATIVE 05/08/2011 1016   LEUKOCYTESUR Negative 10/28/2015 1615   Sepsis Labs: @LABRCNTIP (procalcitonin:4,lacticidven:4)  ) Recent  Results (from the past 240 hour(s))  Resp Panel by RT-PCR (Flu A&B, Covid) Nasopharyngeal Swab     Status: Abnormal   Collection Time: 08/03/21 12:31 PM   Specimen: Nasopharyngeal Swab; Nasopharyngeal(NP) swabs in vial transport medium  Result Value Ref Range Status   SARS Coronavirus 2 by RT PCR NEGATIVE NEGATIVE Final    Comment: (NOTE) SARS-CoV-2 target nucleic acids are NOT DETECTED.  The SARS-CoV-2 RNA is generally detectable in upper respiratory specimens during the acute phase of infection. The lowest concentration of SARS-CoV-2 viral copies this assay can detect is 138 copies/mL. A negative result does not preclude SARS-Cov-2 infection and should not be used as the sole basis for treatment or other patient management decisions. A negative result may occur with  improper specimen collection/handling, submission of specimen other than nasopharyngeal swab, presence of viral mutation(s) within the areas targeted by this assay, and inadequate number of  viral copies(<138 copies/mL). A negative result must be combined with clinical observations, patient history, and epidemiological information. The expected result is Negative.  Fact Sheet for Patients:  BloggerCourse.com  Fact Sheet for Healthcare Providers:  SeriousBroker.it  This test is no t yet approved or cleared by the [name] FDA and  has been authorized for detection and/or diagnosis of SARS-CoV-2 by FDA under an Emergency Use Authorization (EUA). This EUA will remain  in effect (meaning this test can be used) for the duration of the COVID-19 declaration under Section 564(b)(1) of the Act, 21 U.S.C.section 360bbb-3(b)(1), unless the authorization is terminated  or revoked sooner.       Influenza A by PCR POSITIVE (A) NEGATIVE Final   Influenza B by PCR NEGATIVE NEGATIVE Final    Comment: (NOTE) The Xpert Xpress SARS-CoV-2/FLU/RSV plus assay is intended as an aid in the diagnosis of influenza from Nasopharyngeal swab specimens and should not be used as a sole basis for treatment. Nasal washings and aspirates are unacceptable for Xpert Xpress SARS-CoV-2/FLU/RSV testing.  Fact Sheet for Patients: BloggerCourse.com  Fact Sheet for Healthcare Providers: SeriousBroker.it  This test is not yet approved or cleared by the [name] FDA and has been authorized for detection and/or diagnosis of SARS-CoV-2 by FDA under an Emergency Use Authorization (EUA). This EUA will remain in effect (meaning this test can be used) for the duration of the COVID-19 declaration under Section 564(b)(1) of the Act, 21 U.S.C. section 360bbb-3(b)(1), unless the authorization is terminated or revoked.  Performed at [facility], [address], [place], [place] [address]       Studies: DG Abd Portable 1V  Result Date: 08/08/2021 CLINICAL DATA:  Abdominal pain and distention  EXAM: PORTABLE ABDOMEN - 1 VIEW COMPARISON:  Previous studies including the examination of 08/07/2021 FINDINGS: There is abnormal dilation of small-bowel loops measuring up to 6 cm in diameter. There is interval worsening of small bowel dilation. There fluid-filled dilated small bowel loops in the abdomen and pelvis. There is no demonstrable gas in the region of rectosigmoid. Tip of enteric tube is seen in the region of stomach. IMPRESSION: There is abnormal dilation of small-bowel loops suggesting small bowel obstruction with interval worsening. Electronically Signed   By: Ernie Avena M.D.   On: 08/08/2021 08:23    Scheduled Meds:  aspirin EC  81 mg Oral Daily   enoxaparin (LOVENOX) injection  40 mg Subcutaneous Q24H   influenza vaccine adjuvanted  0.5 mL Intramuscular Tomorrow-1000   oseltamivir  30 mg Oral BID   rosuvastatin  5 mg  Oral Daily    Continuous Infusions:     LOS: 4 days     Darlin Drop, MD [facility] Pager [phone]  If 7PM-7AM, please contact night-coverage [email or web address] Password [facility] 08/08/2021, 6:58 PM

## 2021-08-09 ENCOUNTER — Encounter (HOSPITAL_COMMUNITY): Payer: Self-pay | Admitting: Internal Medicine

## 2021-08-09 ENCOUNTER — Encounter (HOSPITAL_COMMUNITY)
Admission: EM | Disposition: A | Discharge status: Home / Self Care | Payer: Self-pay | Source: Home / Self Care | Attending: Internal Medicine

## 2021-08-09 ENCOUNTER — Inpatient Hospital Stay (HOSPITAL_COMMUNITY): Payer: Medicare HMO | Admitting: Certified Registered Nurse Anesthetist

## 2021-08-09 ENCOUNTER — Inpatient Hospital Stay (HOSPITAL_COMMUNITY): Payer: Medicare HMO

## 2021-08-09 DIAGNOSIS — F1010 Alcohol abuse, uncomplicated: Secondary | ICD-10-CM | POA: Diagnosis not present

## 2021-08-09 DIAGNOSIS — K529 Noninfective gastroenteritis and colitis, unspecified: Secondary | ICD-10-CM | POA: Diagnosis not present

## 2021-08-09 DIAGNOSIS — N179 Acute kidney failure, unspecified: Secondary | ICD-10-CM | POA: Diagnosis not present

## 2021-08-09 DIAGNOSIS — N401 Enlarged prostate with lower urinary tract symptoms: Secondary | ICD-10-CM | POA: Diagnosis not present

## 2021-08-09 HISTORY — PX: LAPAROTOMY: SHX154

## 2021-08-09 LAB — BASIC METABOLIC PANEL
Anion gap: 11 (ref 5–15)
BUN: 17 mg/dL (ref 8–23)
CO2: 30 mmol/L (ref 22–32)
Calcium: 9.2 mg/dL (ref 8.9–10.3)
Chloride: 100 mmol/L (ref 98–111)
Creatinine, Ser: 0.89 mg/dL (ref 0.61–1.24)
GFR, Estimated: >60 mL/min (ref >60)
Glucose, Bld: 109 mg/dL — ABNORMAL HIGH (ref 70–99)
Potassium: 4.4 mmol/L (ref 3.5–5.1)
Sodium: 141 mmol/L (ref 135–145)

## 2021-08-09 LAB — CBC
HCT: 43.8 % (ref 39.0–52.0)
Hemoglobin: 14.7 g/dL (ref 13.0–17.0)
MCH: 32.2 pg (ref 26.0–34.0)
MCHC: 33.6 g/dL (ref 30.0–36.0)
MCV: 96.1 fL (ref 80.0–100.0)
Platelets: 214 10*3/uL (ref 150–400)
RBC: 4.56 MIL/uL (ref 4.22–5.81)
RDW: 11.5 % (ref 11.5–15.5)
WBC: 3.5 10*3/uL — ABNORMAL LOW (ref 4.0–10.5)
nRBC: 0 % (ref 0.0–0.2)

## 2021-08-09 LAB — TYPE AND SCREEN
ABO/RH(D): O POS
Antibody Screen: NEGATIVE

## 2021-08-09 LAB — GLUCOSE, CAPILLARY: Glucose-Capillary: 178 mg/dL — ABNORMAL HIGH (ref 70–99)

## 2021-08-09 LAB — ABO/RH: ABO/RH(D): O POS

## 2021-08-09 LAB — MAGNESIUM: Magnesium: 2.5 mg/dL — ABNORMAL HIGH (ref 1.7–2.4)

## 2021-08-09 SURGERY — LAPAROTOMY, EXPLORATORY
Anesthesia: General | Site: Abdomen

## 2021-08-09 MED ORDER — ONDANSETRON HCL 4 MG/2ML IJ SOLN: 4.0000 mg | Freq: Once | INTRAMUSCULAR | Status: DC | PRN

## 2021-08-09 MED ORDER — MORPHINE SULFATE (PF) 2 MG/ML IV SOLN
2.0000 mg | INTRAVENOUS | Status: DC | PRN
  Administered 2021-08-09: 2 mg via INTRAVENOUS
  Administered 2021-08-09 – 2021-08-10 (×3): 4 mg via INTRAVENOUS
  Administered 2021-08-10: 2 mg via INTRAVENOUS
  Administered 2021-08-11 – 2021-08-12 (×2): 4 mg via INTRAVENOUS
  Administered 2021-08-15 – 2021-08-30 (×7): 2 mg via INTRAVENOUS
  Filled 2021-08-09 (×3): qty 1
  Filled 2021-08-09: qty 2
  Filled 2021-08-09: qty 1
  Filled 2021-08-09 (×2): qty 2
  Filled 2021-08-09 (×7): qty 1
  Filled 2021-08-09: qty 2

## 2021-08-09 MED ORDER — DEXMEDETOMIDINE (PRECEDEX) IN NS 20 MCG/5ML (4 MCG/ML) IV SYRINGE
PREFILLED_SYRINGE | INTRAVENOUS | Status: DC | PRN
  Administered 2021-08-09 (×2): 4 ug via INTRAVENOUS

## 2021-08-09 MED ORDER — ORAL CARE MOUTH RINSE: 15.0000 mL | Freq: Once | OROMUCOSAL | Status: AC

## 2021-08-09 MED ORDER — SUGAMMADEX SODIUM 200 MG/2ML IV SOLN
INTRAVENOUS | Status: DC | PRN
  Administered 2021-08-09: 200 mg via INTRAVENOUS

## 2021-08-09 MED ORDER — LACTATED RINGERS IV SOLN: INTRAVENOUS | Status: DC

## 2021-08-09 MED ORDER — ACETAMINOPHEN 10 MG/ML IV SOLN
1000.0000 mg | Freq: Four times a day (QID) | INTRAVENOUS | Status: DC
  Administered 2021-08-09 (×2): 1000 mg via INTRAVENOUS
  Filled 2021-08-09 (×3): qty 100

## 2021-08-09 MED ORDER — FENTANYL CITRATE (PF) 250 MCG/5ML IJ SOLN
INTRAMUSCULAR | Status: AC
  Filled 2021-08-09: qty 5

## 2021-08-09 MED ORDER — SUCCINYLCHOLINE CHLORIDE 200 MG/10ML IV SOSY
PREFILLED_SYRINGE | INTRAVENOUS | Status: DC | PRN
  Administered 2021-08-09: 140 mg via INTRAVENOUS

## 2021-08-09 MED ORDER — OXYCODONE HCL 5 MG/5ML PO SOLN: 5.0000 mg | Freq: Once | ORAL | Status: DC | PRN

## 2021-08-09 MED ORDER — DEXAMETHASONE SODIUM PHOSPHATE 4 MG/ML IJ SOLN
INTRAMUSCULAR | Status: DC | PRN
  Administered 2021-08-09: 4 mg via INTRAVENOUS

## 2021-08-09 MED ORDER — CEFAZOLIN SODIUM-DEXTROSE 2-4 GM/100ML-% IV SOLN
2.0000 g | INTRAVENOUS | Status: DC
  Filled 2021-08-09: qty 100

## 2021-08-09 MED ORDER — FENTANYL CITRATE (PF) 100 MCG/2ML IJ SOLN
25.0000 ug | INTRAMUSCULAR | Status: DC | PRN
  Administered 2021-08-09 (×2): 50 ug via INTRAVENOUS

## 2021-08-09 MED ORDER — CHLORHEXIDINE GLUCONATE 0.12 % MT SOLN: 15.0000 mL | Freq: Once | OROMUCOSAL | Status: AC

## 2021-08-09 MED ORDER — SODIUM CHLORIDE 0.45 % IV SOLN: INTRAVENOUS | Status: DC

## 2021-08-09 MED ORDER — LIDOCAINE HCL (CARDIAC) PF 100 MG/5ML IV SOSY
PREFILLED_SYRINGE | INTRAVENOUS | Status: DC | PRN
  Administered 2021-08-09: 40 mg via INTRAVENOUS

## 2021-08-09 MED ORDER — FENTANYL CITRATE (PF) 100 MCG/2ML IJ SOLN
INTRAMUSCULAR | Status: AC
  Filled 2021-08-09: qty 2

## 2021-08-09 MED ORDER — CHLORHEXIDINE GLUCONATE 0.12 % MT SOLN
OROMUCOSAL | Status: AC
  Administered 2021-08-09: 15 mL
  Filled 2021-08-09: qty 15

## 2021-08-09 MED ORDER — FENTANYL CITRATE (PF) 100 MCG/2ML IJ SOLN
INTRAMUSCULAR | Status: DC | PRN
  Administered 2021-08-09: 100 ug via INTRAVENOUS
  Administered 2021-08-09 (×8): 50 ug via INTRAVENOUS

## 2021-08-09 MED ORDER — PHENYLEPHRINE HCL-NACL 20-0.9 MG/250ML-% IV SOLN
INTRAVENOUS | Status: DC | PRN
  Administered 2021-08-09: 20 ug/min via INTRAVENOUS

## 2021-08-09 MED ORDER — ALBUMIN HUMAN 5 % IV SOLN: INTRAVENOUS | Status: DC | PRN

## 2021-08-09 MED ORDER — 0.9 % SODIUM CHLORIDE (POUR BTL) OPTIME
TOPICAL | Status: DC | PRN
  Administered 2021-08-09: 2000 mL

## 2021-08-09 MED ORDER — PROPOFOL 10 MG/ML IV BOLUS
INTRAVENOUS | Status: AC
  Filled 2021-08-09: qty 20

## 2021-08-09 MED ORDER — LABETALOL HCL 5 MG/ML IV SOLN
5.0000 mg | Freq: Once | INTRAVENOUS | Status: AC
  Administered 2021-08-09: 5 mg via INTRAVENOUS

## 2021-08-09 MED ORDER — AMISULPRIDE (ANTIEMETIC) 5 MG/2ML IV SOLN: 10.0000 mg | Freq: Once | INTRAVENOUS | Status: DC | PRN

## 2021-08-09 MED ORDER — LABETALOL HCL 5 MG/ML IV SOLN
INTRAVENOUS | Status: AC
  Filled 2021-08-09: qty 4

## 2021-08-09 MED ORDER — DEXTROSE IN LACTATED RINGERS 5 % IV SOLN: INTRAVENOUS | Status: DC

## 2021-08-09 MED ORDER — OXYCODONE HCL 5 MG PO TABS: 5.0000 mg | ORAL_TABLET | Freq: Once | ORAL | Status: DC | PRN

## 2021-08-09 MED ORDER — POTASSIUM CHLORIDE IN NACL 20-0.45 MEQ/L-% IV SOLN
INTRAVENOUS | Status: DC
  Filled 2021-08-09: qty 1000

## 2021-08-09 MED ORDER — ROCURONIUM BROMIDE 100 MG/10ML IV SOLN
INTRAVENOUS | Status: DC | PRN
Start: 2021-08-09 — End: 2021-08-09
  Administered 2021-08-09: 70 mg via INTRAVENOUS

## 2021-08-09 MED ORDER — ETOMIDATE 2 MG/ML IV SOLN
INTRAVENOUS | Status: DC | PRN
  Administered 2021-08-09: 14 mg via INTRAVENOUS

## 2021-08-09 SURGICAL SUPPLY — 41 items
BAG COUNTER SPONGE SURGICOUNT (BAG) ×2 IMPLANT
BLADE CLIPPER SURG (BLADE) IMPLANT
CANISTER SUCT 3000ML PPV (MISCELLANEOUS) ×2 IMPLANT
CHLORAPREP W/TINT 26 (MISCELLANEOUS) ×2 IMPLANT
COVER SURGICAL LIGHT HANDLE (MISCELLANEOUS) ×2 IMPLANT
DRAPE LAPAROSCOPIC ABDOMINAL (DRAPES) ×2 IMPLANT
DRAPE WARM FLUID 44X44 (DRAPES) ×2 IMPLANT
DRSG OPSITE POSTOP 4X10 (GAUZE/BANDAGES/DRESSINGS) IMPLANT
DRSG OPSITE POSTOP 4X12 (GAUZE/BANDAGES/DRESSINGS) ×2 IMPLANT
DRSG OPSITE POSTOP 4X8 (GAUZE/BANDAGES/DRESSINGS) IMPLANT
ELECT BLADE 6.5 EXT (BLADE) IMPLANT
ELECT CAUTERY BLADE 6.4 (BLADE) ×2 IMPLANT
ELECT REM PT RETURN 9FT ADLT (ELECTROSURGICAL) ×2
ELECTRODE REM PT RTRN 9FT ADLT (ELECTROSURGICAL) ×1 IMPLANT
GLOVE SRG 8 PF TXTR STRL LF DI (GLOVE) ×1 IMPLANT
GLOVE SURG ENC MOIS LTX SZ8 (GLOVE) ×2 IMPLANT
GLOVE SURG UNDER POLY LF SZ8 (GLOVE) ×2
GOWN STRL REUS W/ TWL LRG LVL3 (GOWN DISPOSABLE) ×1 IMPLANT
GOWN STRL REUS W/ TWL XL LVL3 (GOWN DISPOSABLE) ×1 IMPLANT
GOWN STRL REUS W/TWL LRG LVL3 (GOWN DISPOSABLE) ×2
GOWN STRL REUS W/TWL XL LVL3 (GOWN DISPOSABLE) ×2
HANDLE SUCTION POOLE (INSTRUMENTS) ×1 IMPLANT
KIT BASIN OR (CUSTOM PROCEDURE TRAY) ×2 IMPLANT
KIT TURNOVER KIT B (KITS) ×2 IMPLANT
LIGASURE IMPACT 36 18CM CVD LR (INSTRUMENTS) IMPLANT
NS IRRIG 1000ML POUR BTL (IV SOLUTION) ×4 IMPLANT
PACK GENERAL/GYN (CUSTOM PROCEDURE TRAY) ×2 IMPLANT
PAD ARMBOARD 7.5X6 YLW CONV (MISCELLANEOUS) ×2 IMPLANT
PENCIL SMOKE EVACUATOR (MISCELLANEOUS) ×2 IMPLANT
SPECIMEN JAR LARGE (MISCELLANEOUS) IMPLANT
SPONGE T-LAP 18X18 ~~LOC~~+RFID (SPONGE) IMPLANT
STAPLER VISISTAT 35W (STAPLE) ×2 IMPLANT
SUCTION POOLE HANDLE (INSTRUMENTS) ×2
SUT PDS AB 1 TP1 96 (SUTURE) ×4 IMPLANT
SUT SILK 2 0 SH CR/8 (SUTURE) ×2 IMPLANT
SUT SILK 2 0 TIES 10X30 (SUTURE) ×2 IMPLANT
SUT SILK 3 0 SH CR/8 (SUTURE) ×2 IMPLANT
SUT SILK 3 0 TIES 10X30 (SUTURE) ×2 IMPLANT
TOWEL GREEN STERILE (TOWEL DISPOSABLE) ×2 IMPLANT
TRAY FOLEY MTR SLVR 16FR STAT (SET/KITS/TRAYS/PACK) IMPLANT
YANKAUER SUCT BULB TIP NO VENT (SUCTIONS) IMPLANT

## 2021-08-09 NOTE — Anesthesia Postprocedure Evaluation (Signed)
Anesthesia Post Note  Patient: [name]  Procedure(s) Performed: EXPLORATORY LAPAROTOMY , bowel obstruction (Abdomen)     Patient location during evaluation: PACU Anesthesia Type: General Level of consciousness: awake and alert and oriented Pain management: pain level controlled Vital Signs Assessment: post-procedure vital signs reviewed and stable Respiratory status: spontaneous breathing, nonlabored ventilation and respiratory function stable Cardiovascular status: blood pressure returned to baseline and stable Postop Assessment: no apparent nausea or vomiting Anesthetic complications: no   No notable events documented.  Last Vitals:  Vitals:   08/09/21 1141 08/09/21 1156  BP: (!) 159/105 (!) 158/105  Pulse: 96 98  Resp: 18 18  Temp:    SpO2: 96% 98%    Last Pain:  Vitals:   08/09/21 1207  TempSrc:   PainSc: 4    Pain Goal: Patients Stated Pain Goal: 2 (08/06/21 1635)                 Haward Pope A.

## 2021-08-09 NOTE — Anesthesia Procedure Notes (Signed)
Arterial Line Insertion Start/End11/06/2021 9:25 AM, 08/09/2021 9:30 AM Performed by: Rachel Moulds, CRNA, CRNA  Patient location: Pre-op. Preanesthetic checklist: patient identified, IV checked, site marked, risks and benefits discussed, surgical consent, monitors and equipment checked, pre-op evaluation, timeout performed and anesthesia consent Lidocaine 1% used for infiltration Left, radial was placed Catheter size: 20 G Hand hygiene performed  and maximum sterile barriers used   Attempts: 1 Procedure performed without using ultrasound guided technique. Following insertion, Biopatch and dressing applied. Patient tolerated the procedure well with no immediate complications.

## 2021-08-09 NOTE — Progress Notes (Signed)
Patient ID: [name], male   DOB: 1951-05-16, 70 y.o.   MRN: [id number] Day of Surgery   Subjective: No flatus, no BM ROS negative except as listed above. Objective: Vital signs in last 24 hours: Temp:  [98.3 F (36.8 C)-98.9 F (37.2 C)] 98.3 F (36.8 C) (11/09 0504) Pulse Rate:  [66-72] 68 (11/09 0504) Resp:  [16-18] 18 (11/09 0504) BP: (134-158)/(91-97) 158/91 (11/09 0504) SpO2:  [96 %-100 %] 96 % (11/09 0504) Last BM Date: 08/03/21  Intake/Output from previous day: 11/08 0701 - 11/09 0700 In: 730 [I.V.:730] Out: 2025 [Urine:825; Emesis/NG output:1200] Intake/Output this shift: No intake/output data recorded.  General appearance: alert and cooperative Resp: clear to auscultation bilaterally Cardio: regular rate and rhythm GI: dostended, quiet, NT Extremities: calves soft  Lab Results: CBC  Recent Labs    08/08/21 0909 08/09/21 0203  WBC 3.4* 3.5*  HGB 15.2 14.7  HCT 44.9 43.8  PLT 170 214   BMET Recent Labs    08/08/21 0142 08/09/21 0203  NA 140 141  K 4.1 4.4  CL 104 100  CO2 25 30  GLUCOSE 100* 109*  BUN 18 17  CREATININE 0.77 0.89  CALCIUM 8.9 9.2   PT/INR No results for input(s): LABPROT, INR in the last 72 hours. ABG No results for input(s): PHART, HCO3 in the last 72 hours.  Invalid input(s): PCO2, PO2  Studies/Results: DG Abd Portable 1V  Result Date: 08/08/2021 CLINICAL DATA:  Abdominal pain and distention EXAM: PORTABLE ABDOMEN - 1 VIEW COMPARISON:  Previous studies including the examination of 08/07/2021 FINDINGS: There is abnormal dilation of small-bowel loops measuring up to 6 cm in diameter. There is interval worsening of small bowel dilation. There fluid-filled dilated small bowel loops in the abdomen and pelvis. There is no demonstrable gas in the region of rectosigmoid. Tip of enteric tube is seen in the region of stomach. IMPRESSION: There is abnormal dilation of small-bowel loops suggesting small bowel obstruction with  interval worsening. Electronically Signed   By: Ernie Avena M.D.   On: 08/08/2021 08:23   DG Abd Portable 1V  Result Date: 08/07/2021 CLINICAL DATA:  Abdominal distention EXAM: PORTABLE ABDOMEN - 1 VIEW COMPARISON:  Previous studies including the examination done earlier today FINDINGS: Tip of enteric tube is seen in the stomach. There is gaseous distention of small-bowel loops. Gas-filled small bowel loops measure up to 4.9 cm in diameter. There is increased density in the lumen of colon, possibly oral contrast or fluid in the lumen. IMPRESSION: There is abnormal dilation of small-bowel loops suggesting small-bowel obstruction with no significant interval change. Electronically Signed   By: Ernie Avena M.D.   On: 08/07/2021 10:45    Anti-infectives: Anti-infectives (From admission, onward)    Start     Dose/Rate Route Frequency Ordered Stop   08/04/21 2200  oseltamivir (TAMIFLU) capsule 30 mg        30 mg Oral 2 times daily 08/04/21 0822 08/09/21 0959   08/04/21 1000  oseltamivir (TAMIFLU) capsule 75 mg        75 mg Oral  Once 08/04/21 0825 08/04/21 1116   08/03/21 1645  cefTRIAXone (ROCEPHIN) 2 g in sodium chloride 0.9 % 100 mL IVPB  Status:  Discontinued        2 g 200 mL/hr over 30 Minutes Intravenous Every 24 hours 08/03/21 1631 08/06/21 1519   08/03/21 1645  metroNIDAZOLE (FLAGYL) IVPB 500 mg  Status:  Discontinued        500  mg 100 mL/hr over 60 Minutes Intravenous Every 12 hours 08/03/21 1631 08/06/21 1519       Assessment/Plan: Abdominal pain and diarrhea ?Partial SBO vs ileus vs enteritis - no improvement. Will proceed with ex lap, possible bowel resection today. I discussed the procedure, risks, and benefits with him and he agrees. I also called his aunt who helps him with medical decisions and discussed the above.    ID - ancef periop VTE - SCDs, lovenox FEN - NPO, NGT LIWS Foley - none   AKI HTN HLD COPD CHF   LOS: 5 days    Violeta Gelinas, MD,  MPH, FACS Trauma & General Surgery Use [email or web address] to contact on call provider  08/09/2021

## 2021-08-09 NOTE — Progress Notes (Signed)
Mobility Specialist Progress Note   08/09/21 1743  Mobility  Activity  (Bed Mobility)  Level of Assistance Standby assist, set-up cues, supervision of patient - no hands on  Assistive Device None  Mobility Sit up in bed/chair position for meals  Mobility Response Tolerated fair  Mobility performed by Mobility specialist  $Mobility charge 1 Mobility   Received pt in bed c/o of a lot of abdominal pain, 8/10. Denied any ambulation but agreeable to bed mobility. 2x10 hand held resistant hip abduction & adduction, hip flexion, hand held resistant plantar flexion & dorsiflexion. Left call bell by pt's side and all needs met.   Holland Falling Mobility Specialist Phone Number [phone]

## 2021-08-09 NOTE — Progress Notes (Signed)
PROGRESS NOTE    [name]  [name] DOB: [birth year] DOA: 08/03/2021 PCP: Lauree Chandler, NP    Brief Narrative:  Mr. [name] was admitted to the hospital with the working diagnosis of small bowel obstruction. Now sp exploratory laparotomy and lysis of adhesions.   70 year old male past medical history for hypertension, heart failure, GERD, BPH, history of alcohol abuse, who presented with lower abdominal pain, for 3 days.  Colicky intense abdominal pain radiating to his groin and genitalia.  Positive diarrhea.  On his initial physical examination he was afebrile, his blood pressure was 116/75, heart rate 87, respiratory rate 15, temperature 98.3, oxygen saturation 98%, his lungs were clear to auscultation bilaterally, heart S1-S2, present, rhythmic, the abdomen was soft, suprapubic and bilateral quadrant tenderness, no rebound or guarding, no lower extremity edema.  Sodium 138, potassium 3.9, chloride 102, bicarb 26, glucose 110, BUN 34, creatinine 1.59, white count 4.7, hemoglobin 14.7, hematocrit 42.8, platelets 166 Influenza A+, SARS COVID-19 negative.  CT of abdomen and pelvis showed small bowel obstruction, probably in the mid ileal region.  Multiple hepatic cysts and renal cysts  Patient was admitted to the medical ward, placed on supportive medical therapy.  Patient received oseltamavir for influenza.  He was diagnosed with enteritis, received ceftriaxone and metronidazole for antibiotic therapy. Surgery was consulted and recommended further medical therapy.  08/06/21 patient had no flatus, persistent abdominal distention, diagnosed with worsening small bowel obstruction and nasogastric tube was placed.  11/08 because not improving small bowel obstruction, patient was taken to the OR for exploratory laparotomy with lysis of adhesions.   Assessment & Plan:   Principal Problem:   Enteritis Active Problems:   Alcohol abuse   Diarrhea   Chronic systolic CHF  (congestive heart failure) (HCC)   Cocaine use disorder, moderate, dependence (HCC)   Benign prostatic hyperplasia with weak urinary stream   AKI (acute kidney injury) ([place])   Small bowel obstruction. Patient failed conservative supportive medical therapy. Today underwent exploratory laparotomy with lysis of adhesions.  Posit operative has abdominal pain with movement. Has NG tube in place for post operative ileum.   Plan to continue medical therapy with IV fluids (change to D5 LR at 75 ml per HR) and as needed antiemetics - analgesics.  Continue antiacid therapy with pantoprazole. Follow with surgery recommendations.   2. Influenza A. Patient with no dyspnea, continue with antiviral therapy with oseltamivir.   3. Chronic systolic heart failure (EF LV 20 to 25% from 2017). No clinical signs of heart failure decompensations. Continue close blood pressure monitoring.  Continue IV fluids at a lower rate to prevent heart failure decompensation.   4. AKI renal function with serum cr at 0.89, K is 4,4 and serum bicarbonate at 30, Continue volume repletion with balanced electrolyte solutions IV   5. Reactive leukopenia and thrombocytopenia resolved thrombocytopenia with plt count up to 214. Wbc is 3,5 today.   6. Dyslipidemia. Resume statin when recovered from surgery and tolerating po well.   Patient continue to be at high risk for worsening post operative ileus.   Status is: Inpatient  Remains inpatient appropriate because: post operative monitoring   DVT prophylaxis: Enoxaparin   Code Status:    full  Family Communication:   No family at the bedside    Consultants:  Surgery   Procedures:  Exploratory laparotomy and lysis of adhesions     Subjective: Patient with abdominal pain post operative worse with movement, no nausea or vomiting, no chest  pain or dyspnea.   Objective: Vitals:   08/09/21 1141 08/09/21 1156 08/09/21 1211 08/09/21 1235  BP: (!) 159/105 (!) 158/105  137/86 (!) 156/92  Pulse: 96 98 85 95  Resp: 18 18 18 18   Temp:   97.7 F (36.5 C) 97.9 F (36.6 C)  TempSrc:   Oral Oral  SpO2: 96% 98% 97% 95%  Weight:      Height:        Intake/Output Summary (Last 24 hours) at 08/09/2021 1512 Last data filed at 08/09/2021 1500 Gross per 24 hour  Intake 1544.8 ml  Output 760 ml  Net 784.8 ml   Filed Weights   08/03/21 1038 08/09/21 0900  Weight: 81.6 kg 81.6 kg    Examination:   General: Not in pain or dyspnea .  Neurology: Awake and alert, non focal  E ENT: no pallor, no icterus, oral mucosa moist Cardiovascular: No JVD. S1-S2 present, rhythmic, no gallops, rubs, or murmurs. No lower extremity edema. Pulmonary: positive breath sounds bilaterally, adequate air movement, no wheezing, rhonchi or rales. Gastrointestinal. Abdomen mild distended, tender to palpation, no rebound Skin. No rashes Musculoskeletal: no joint deformities     Data Reviewed: I have personally reviewed following labs and imaging studies  CBC: Recent Labs  Lab 08/03/21 1045 08/03/21 1707 08/06/21 0814 08/06/21 1553 08/07/21 0016 08/08/21 0909 08/09/21 0203  WBC 4.7   < > 2.2* 2.5* 2.7* 3.4* 3.5*  NEUTROABS 3.2  --  1.3*  --   --   --   --   HGB 14.7   < > 15.0 14.2 14.5 15.2 14.7  HCT 42.8   < > 46.0 43.3 44.6 44.9 43.8  MCV 95.7   < > 97.9 97.5 98.2 96.1 96.1  PLT 166   < > 105* 167 166 170 214   < > = values in this interval not displayed.   Basic Metabolic Panel: Recent Labs  Lab 08/05/21 1315 08/06/21 0814 08/07/21 0016 08/08/21 0142 08/09/21 0203  NA 139 140 141 140 141  K 4.0 4.2 4.2 4.1 4.4  CL 104 104 104 104 100  CO2 27 26 28 25 30   GLUCOSE 125* 104* 106* 100* 109*  BUN 39* 29* 22 18 17   CREATININE 1.08 0.88 0.86 0.77 0.89  CALCIUM 8.8* 8.8* 8.9 8.9 9.2  MG 2.6*  --   --   --  2.5*   GFR: Estimated Creatinine Clearance: 88.5 mL/min (by C-G formula based on SCr of 0.89 mg/dL). Liver Function Tests: Recent Labs  Lab  08/03/21 1045 08/04/21 0214  AST 32 30  ALT 17 17  ALKPHOS 42 34*  BILITOT 0.8 0.6  PROT 7.8 6.8  ALBUMIN 4.4 3.6   Recent Labs  Lab 08/03/21 1045  LIPASE 24   No results for input(s): AMMONIA in the last 168 hours. Coagulation Profile: No results for input(s): INR, PROTIME in the last 168 hours. Cardiac Enzymes: No results for input(s): CKTOTAL, CKMB, CKMBINDEX, TROPONINI in the last 168 hours. BNP (last 3 results) No results for input(s): PROBNP in the last 8760 hours. HbA1C: No results for input(s): HGBA1C in the last 72 hours. CBG: Recent Labs  Lab 08/06/21 2111 08/07/21 0800 08/07/21 2106 08/08/21 0750 08/08/21 2053  GLUCAP 97 86 98 104* 96   Lipid Profile: No results for input(s): CHOL, HDL, LDLCALC, TRIG, CHOLHDL, LDLDIRECT in the last 72 hours. Thyroid Function Tests: No results for input(s): TSH, T4TOTAL, FREET4, T3FREE, THYROIDAB in the last 72 hours. Anemia Panel:  No results for input(s): VITAMINB12, FOLATE, FERRITIN, TIBC, IRON, RETICCTPCT in the last 72 hours.    Radiology Studies: I have reviewed all of the imaging during this hospital visit personally     Scheduled Meds:  aspirin EC  81 mg Oral Daily   enoxaparin (LOVENOX) injection  40 mg Subcutaneous Q24H   fentaNYL       influenza vaccine adjuvanted  0.5 mL Intramuscular Tomorrow-1000   labetalol       rosuvastatin  5 mg Oral Daily   Continuous Infusions:  0.45 % NaCl with KCl 20 mEq / L 125 mL/hr at 08/09/21 1323   acetaminophen 1,000 mg (08/09/21 1325)    ceFAZolin (ANCEF) IV       LOS: 5 days        Sherrel Shafer Annett Gula, MD

## 2021-08-09 NOTE — Transfer of Care (Signed)
Immediate Anesthesia Transfer of Care Note  Patient: [name]  Procedure(s) Performed: EXPLORATORY LAPAROTOMY , bowel obstruction (Abdomen)  Patient Location: PACU  Anesthesia Type:General  Level of Consciousness: awake and patient cooperative  Airway & Oxygen Therapy: Patient Spontanous Breathing  Post-op Assessment: Report given to RN and Post -op Vital signs reviewed and stable  Post vital signs: Reviewed and stable  Last Vitals:  Vitals Value Taken Time  BP 152/114 08/09/21 1131  Temp    Pulse 95 08/09/21 1132  Resp 16 08/09/21 1132  SpO2 99 % 08/09/21 1132  Vitals shown include unvalidated device data.  Last Pain:  Vitals:   08/09/21 0914  TempSrc: Oral  PainSc: 0-No pain      Patients Stated Pain Goal: 2 (08/06/21 1635)  Complications: No notable events documented.

## 2021-08-09 NOTE — Op Note (Signed)
  08/09/2021  11:19 AM  PATIENT:  [name]  70 y.o. male  PRE-OPERATIVE DIAGNOSIS:  small bowel obstruction  POST-OPERATIVE DIAGNOSIS:  small bowel obstruction  PROCEDURE:  Procedure(s): EXPLORATORY LAPAROTOMY LYSIS OF ADHESIONS  SURGEON:  Surgeon(s): Violeta Gelinas, MD  ASSISTANTS: Carl Best, PA-C   ANESTHESIA:   general  EBL:  Total I/O In: 500 [IV Piggyback:500] Out: 35 [Urine:35]  BLOOD ADMINISTERED:none  DRAINS: none   SPECIMEN:  No Specimen  DISPOSITION OF SPECIMEN:  N/A  COUNTS:  YES  DICTATION: .Dragon Dictation Procedure in detail: Informed consent was obtained.  He received intravenous antibiotics.  He was brought to the operating room and general endotracheal anesthesia was administered by the anesthesia staff.  Foley catheter was placed by nursing.  His abdomen was prepped and draped in a sterile fashion.  We did a timeout procedure.  Midline incision was made above the umbilicus to start.  Subcutaneous tissues were dissected down revealing the anterior fascia which was divided sharply along the midline.  The peritoneal cavity was entered under direct vision.  The fascia was opened to the length of the incision.  Exploration revealed a lot of dilated proximal small bowel without any visible adhesions.  I extended the incision down below the umbilicus and we gradually inspected the small bowel.  The site of obstruction was located.  There was a very dense wad of adhesions from the distal ileum down to the mesentery.  This was about 30 cm back from the ligament of Treitz.  We then began a very gradual and careful adhesiolysis to free up the bowel at that location.  There were no enterotomies.  The bowel was viable.  At this point, a lot of the gas and enteric contents was milked back more proximally into the stomach to be evacuated by his nasogastric tube.  His original nasogastric tube was not functioning properly so that was replaced by anesthesia.   At least 1.8 L of enteric contents were evacuated.  We then we ran the small bowel.  The area of adhesiolysis remained intact and viable and bowel contents passed easily through into his colon.  The bowel was returned to the abdomen in anatomic position.  The abdomen was irrigated and we ensured hemostasis.  The fascia was closed with running #1 looped PDS and the skin was irrigated and closed with staples.  A sterile dressing was applied.  All counts were correct.  He tolerated the procedure well without apparent complication and was taken to recovery in stable condition. PATIENT DISPOSITION:  PACU - hemodynamically stable.   Delay start of Pharmacological VTE agent (>24hrs) due to surgical blood loss or risk of bleeding:  no  Violeta Gelinas, MD, MPH, FACS Pager: [phone]  11/9/202211:19 AM

## 2021-08-09 NOTE — Anesthesia Preprocedure Evaluation (Addendum)
Anesthesia Evaluation  Patient identified by MRN, date of birth, ID band Patient awake    Reviewed: Allergy & Precautions, NPO status , Patient's Chart, lab work & pertinent test results  Airway Mallampati: II  TM Distance: >3 FB Neck ROM: Full    Dental  (+) Edentulous Upper, Edentulous Lower   Pulmonary neg pulmonary ROS, former smoker,    Pulmonary exam normal breath sounds clear to auscultation       Cardiovascular METS: 3 - Mets hypertension, +CHF (EF 20-25%)  Normal cardiovascular exam Rhythm:Regular Rate:Normal     Neuro/Psych PSYCHIATRIC DISORDERS Depression  Neuromuscular disease (lumbar radiculopathy)    GI/Hepatic GERD  ,(+)     substance abuse (h/o alcohol and cocaine use)  alcohol use and cocaine use,   Endo/Other  negative endocrine ROS  Renal/GU negative Renal ROS  negative genitourinary   Musculoskeletal negative musculoskeletal ROS (+)   Abdominal   Peds negative pediatric ROS (+)  Hematology negative hematology ROS (+)   Anesthesia Other Findings NGT in place  Reproductive/Obstetrics negative OB ROS                           Anesthesia Physical Anesthesia Plan  ASA: 4 and emergent  Anesthesia Plan: General   Post-op Pain Management:    Induction: Intravenous, Rapid sequence and Cricoid pressure planned  PONV Risk Score and Plan: 2 and Ondansetron, Dexamethasone, Treatment may vary due to age or medical condition and Midazolam  Airway Management Planned: Oral ETT  Additional Equipment: Arterial line  Intra-op Plan:   Post-operative Plan: Extubation in OR  Informed Consent: I have reviewed the patients History and Physical, chart, labs and discussed the procedure including the risks, benefits and alternatives for the proposed anesthesia with the patient or authorized representative who has indicated his/her understanding and acceptance.     Dental advisory  given  Plan Discussed with: CRNA, Anesthesiologist and Surgeon  Anesthesia Plan Comments:       Anesthesia Quick Evaluation

## 2021-08-09 NOTE — Anesthesia Procedure Notes (Signed)
Procedure Name: Intubation Date/Time: 08/09/2021 9:59 AM Performed by: Oletta Lamas, CRNA Pre-anesthesia Checklist: Patient identified, Emergency Drugs available, Suction available and Patient being monitored Patient Re-evaluated:Patient Re-evaluated prior to induction Oxygen Delivery Method: Circle System Utilized Preoxygenation: Pre-oxygenation with 100% oxygen Induction Type: IV induction, Rapid sequence and Cricoid Pressure applied Laryngoscope Size: Mac and 4 Grade View: Grade I Tube type: Oral Number of attempts: 1 Airway Equipment and Method: Stylet Placement Confirmation: ETT inserted through vocal cords under direct vision, positive ETCO2 and breath sounds checked- equal and bilateral Secured at: 23 cm Tube secured with: Tape Dental Injury: Teeth and Oropharynx as per pre-operative assessment

## 2021-08-09 NOTE — Progress Notes (Signed)
PT Cancellation Note  Patient Details Name: [name] MRN: [id number] DOB: 1951-06-13   Cancelled Treatment:    Reason Eval/Treat Not Completed: Patient at procedure or test/unavailable.  Will retry at another time, ex lap scheduled for today.     Ivar Drape 08/09/2021, 9:32 AM  Samul Dada, PT PhD Acute Rehab Dept. Number: [facility] [id number] and [facility] [phone]

## 2021-08-10 ENCOUNTER — Encounter (HOSPITAL_COMMUNITY): Payer: Self-pay | Admitting: General Surgery

## 2021-08-10 ENCOUNTER — Inpatient Hospital Stay: Payer: Self-pay

## 2021-08-10 DIAGNOSIS — E43 Unspecified severe protein-calorie malnutrition: Secondary | ICD-10-CM

## 2021-08-10 LAB — CBC
HCT: 42.6 % (ref 39.0–52.0)
Hemoglobin: 14.4 g/dL (ref 13.0–17.0)
MCH: 32.6 pg (ref 26.0–34.0)
MCHC: 33.8 g/dL (ref 30.0–36.0)
MCV: 96.4 fL (ref 80.0–100.0)
Platelets: 295 10*3/uL (ref 150–400)
RBC: 4.42 MIL/uL (ref 4.22–5.81)
RDW: 11.8 % (ref 11.5–15.5)
WBC: 5.9 10*3/uL (ref 4.0–10.5)
nRBC: 0 % (ref 0.0–0.2)

## 2021-08-10 LAB — BASIC METABOLIC PANEL
Anion gap: 5 (ref 5–15)
BUN: 14 mg/dL (ref 8–23)
CO2: 32 mmol/L (ref 22–32)
Calcium: 8.5 mg/dL — ABNORMAL LOW (ref 8.9–10.3)
Chloride: 101 mmol/L (ref 98–111)
Creatinine, Ser: 0.76 mg/dL (ref 0.61–1.24)
GFR, Estimated: >60 mL/min (ref >60)
Glucose, Bld: 158 mg/dL — ABNORMAL HIGH (ref 70–99)
Potassium: 3.8 mmol/L (ref 3.5–5.1)
Sodium: 138 mmol/L (ref 135–145)

## 2021-08-10 LAB — GLUCOSE, CAPILLARY: Glucose-Capillary: 153 mg/dL — ABNORMAL HIGH (ref 70–99)

## 2021-08-10 LAB — MAGNESIUM: Magnesium: 2.2 mg/dL (ref 1.7–2.4)

## 2021-08-10 MED ORDER — ORAL CARE MOUTH RINSE
15.0000 mL | Freq: Two times a day (BID) | OROMUCOSAL | Status: DC
  Administered 2021-08-10 – 2021-09-08 (×55): 15 mL via OROMUCOSAL

## 2021-08-10 MED ORDER — ACETAMINOPHEN 500 MG PO TABS
1000.0000 mg | ORAL_TABLET | Freq: Four times a day (QID) | ORAL | Status: DC
Start: 2021-08-10 — End: 2021-08-20
  Administered 2021-08-10 – 2021-08-18 (×31): 1000 mg via ORAL
  Administered 2021-08-19 (×2): 500 mg via ORAL
  Filled 2021-08-10 (×35): qty 2

## 2021-08-10 MED ORDER — ACETAMINOPHEN 10 MG/ML IV SOLN
1000.0000 mg | Freq: Four times a day (QID) | INTRAVENOUS | Status: AC
  Administered 2021-08-10 (×2): 1000 mg via INTRAVENOUS
  Filled 2021-08-10: qty 100

## 2021-08-10 MED ORDER — SODIUM CHLORIDE 0.9% FLUSH
10.0000 mL | INTRAVENOUS | Status: DC | PRN
  Administered 2021-08-27 – 2021-08-29 (×2): 10 mL

## 2021-08-10 MED ORDER — SODIUM CHLORIDE 0.9% FLUSH
10.0000 mL | Freq: Two times a day (BID) | INTRAVENOUS | Status: DC
  Administered 2021-08-10 – 2021-09-01 (×29): 10 mL

## 2021-08-10 MED ORDER — CHLORHEXIDINE GLUCONATE CLOTH 2 % EX PADS
6.0000 | MEDICATED_PAD | Freq: Every day | CUTANEOUS | Status: DC
  Administered 2021-08-10 – 2021-09-07 (×28): 6 via TOPICAL

## 2021-08-10 NOTE — Progress Notes (Signed)
Occupational Therapy Treatment Patient Details Name: [name] MRN: [id number] DOB: 1951-03-08 Today's Date: 08/10/2021   History of present illness Patient is a 70 y/o male who presents on 08/03/21 due to abdominal pain and diarrhea. CT abdomen/pelvis- dilated fluid and air-filled small intestine consistent with small bowel obstruction. Per surgery, ilieus vs enteritis vs partial SBO.  PMH includes HTN, chronic systolic CHF- EF 47%, prior history of alcoholism   OT comments  Patient with nursing tech preparing to return to bed from recliner when OT arrived. Patient was willing to participate with OT but was eager to return to bed due to abdominal pain. Patient performed LB dressing seated in recliner for doffing and donning socks and stated he had to return to bed due to pain. Patient was min guard to stand and transfer to EOB and to get to supine.  AROM exercises performed while supine to patient's tolerance to increase activity tolerance. Acute OT to continue to follow.    Recommendations for follow up therapy are one component of a multi-disciplinary discharge planning process, led by the attending physician.  Recommendations may be updated based on patient status, additional functional criteria and insurance authorization.    Follow Up Recommendations  No OT follow up    Assistance Recommended at Discharge Intermittent Supervision/Assistance  Equipment Recommendations  None recommended by OT    Recommendations for Other Services      Precautions / Restrictions Precautions Precautions: Fall Precaution Comments: monitor IV for pt Restrictions Weight Bearing Restrictions: No       Mobility Bed Mobility Overal bed mobility: Needs Assistance Bed Mobility: Sit to Supine       Sit to supine: Min guard   General bed mobility comments: min guard due to lines    Transfers Overall transfer level: Needs assistance Equipment used: Rolling walker (2 wheels) Transfers: Sit  to/from Stand Sit to Stand: Min guard           General transfer comment: min guard for transfer from recliner to eob due to lines     Balance Overall balance assessment: Needs assistance;History of Falls Sitting-balance support: Feet supported Sitting balance-Leahy Scale: Good     Standing balance support: Bilateral upper extremity supported Standing balance-Leahy Scale: Fair Standing balance comment: stood for transfer only                           ADL either performed or assessed with clinical judgement   ADL Overall ADL's : Needs assistance/impaired                 Upper Body Dressing : Independent;Sitting Upper Body Dressing Details (indicate cue type and reason): able to doff and donn socks seated in recliner                 Functional mobility during ADLs: Min guard;Rolling walker (2 wheels) General ADL Comments: patient limited by pain.  Only willing to perform doffing and donn socks for self care tasks    Extremity/Trunk Assessment Upper Extremity Assessment Upper Extremity Assessment: Defer to OT evaluation            Vision       Perception     Praxis      Cognition Arousal/Alertness: Awake/alert Behavior During Therapy: Flat affect Overall Cognitive Status: Difficult to assess  General Comments: difficult to understand and patient was fixated on abdominal pain          Exercises Exercises: General Upper Extremity General Exercises - Upper Extremity Shoulder Flexion: AROM;Both;10 reps Shoulder Extension: AROM;Both;10 reps Shoulder ABduction: 10 reps;AROM;Both Shoulder ADduction: AROM;Both;10 reps   Shoulder Instructions       General Comments      Pertinent Vitals/ Pain       Pain Assessment: Faces Faces Pain Scale: Hurts even more Pain Location: abdomen Pain Descriptors / Indicators: Sore;Aching Pain Intervention(s): Limited activity within patient's  tolerance;Repositioned  Home Living                                          Prior Functioning/Environment              Frequency  Min 2X/week        Progress Toward Goals  OT Goals(current goals can now be found in the care plan section)  Progress towards OT goals: Progressing toward goals  Acute Rehab OT Goals Patient Stated Goal: none stated OT Goal Formulation: With patient Time For Goal Achievement: 08/19/21 Potential to Achieve Goals: Good ADL Goals Pt Will Perform Grooming: with modified independence;standing Pt Will Perform Lower Body Bathing: with modified independence Pt Will Perform Lower Body Dressing: with modified independence;sit to/from stand Pt Will Transfer to Toilet: with modified independence;ambulating;regular height toilet Pt Will Perform Tub/Shower Transfer: Tub transfer;with modified independence;ambulating;3 in 1  Plan Discharge plan remains appropriate;Frequency remains appropriate    Co-evaluation                 AM-PAC OT "6 Clicks" Daily Activity     Outcome Measure   Help from another person eating meals?: None Help from another person taking care of personal grooming?: A Little Help from another person toileting, which includes using toliet, bedpan, or urinal?: A Little Help from another person bathing (including washing, rinsing, drying)?: A Little Help from another person to put on and taking off regular upper body clothing?: None Help from another person to put on and taking off regular lower body clothing?: A Little 6 Click Score: 20    End of Session Equipment Utilized During Treatment: Rolling walker (2 wheels)  OT Visit Diagnosis: Unsteadiness on feet (R26.81);History of falling (Z91.81);Pain   Activity Tolerance Patient limited by pain   Patient Left in bed;with call bell/phone within reach;with bed alarm set   Nurse Communication Mobility status        Time: 7124-5809 OT Time Calculation  (min): 15 min  Charges: OT General Charges $OT Visit: 1 Visit OT Treatments $Self Care/Home Management : 8-22 mins  Alfonse Flavors, OTA Acute Rehabilitation Services  Pager [phone] Office [phone]   Dewain Penning 08/10/2021, 1:45 PM

## 2021-08-10 NOTE — Progress Notes (Signed)
PROGRESS NOTE    [name]  HER:[id number] DOB: Aug 18, 1951 DOA: 08/03/2021 PCP: Sharon Seller, NP    Brief Narrative:  Mr. [name] was admitted to the hospital with the working diagnosis of small bowel obstruction. Now sp exploratory laparotomy and lysis of adhesions.    70 year old male past medical history for hypertension, heart failure, GERD, BPH, history of alcohol abuse, who presented with lower abdominal pain, for 3 days.  Colicky intense abdominal pain radiating to his groin and genitalia.  Positive diarrhea.  On his initial physical examination he was afebrile, his blood pressure was 116/75, heart rate 87, respiratory rate 15, temperature 98.3, oxygen saturation 98%, his lungs were clear to auscultation bilaterally, heart S1-S2, present, rhythmic, the abdomen was soft, suprapubic and bilateral quadrant tenderness, no rebound or guarding, no lower extremity edema.   Sodium 138, potassium 3.9, chloride 102, bicarb 26, glucose 110, BUN 34, creatinine 1.59, white count 4.7, hemoglobin 14.7, hematocrit 42.8, platelets 166 Influenza A+, SARS COVID-19 negative.   CT of abdomen and pelvis showed small bowel obstruction, probably in the mid ileal region.  Multiple hepatic cysts and renal cysts   Patient was admitted to the medical ward, placed on supportive medical therapy.  Patient received oseltamavir for influenza.   He was diagnosed with enteritis, received ceftriaxone and metronidazole for antibiotic therapy. Surgery was consulted and recommended further medical therapy.   08/06/21 patient had no flatus, persistent abdominal distention, diagnosed with worsening small bowel obstruction and nasogastric tube was placed.   11/08 because not improving small bowel obstruction, patient was taken to the OR for exploratory laparotomy with lysis of adhesions.      Assessment & Plan:   Principal Problem:   Enteritis Active Problems:   Alcohol abuse   Diarrhea   Chronic systolic  CHF (congestive heart failure) (HCC)   Cocaine use disorder, moderate, dependence (HCC)   Benign prostatic hyperplasia with weak urinary stream   AKI (acute kidney injury) (HCC)   Protein-calorie malnutrition, severe   Small bowel obstruction now Sp exploratory laparotomy with lysis of adhesions. Post operative ileus.  Today his abdominal pain is better controlled.  He is out of bed to chair.    Continue with as needed antiemetics - analgesics.  Antiacid therapy with pantoprazole. Iv fluids for hydration and volume repletion  Continue to follow with surgery recommendations.    2. Influenza A. Patient with no dyspnea, cmplteted antiviral therapy with oseltamivir.    3. Chronic systolic heart failure (EF LV 20 to 25% from 2017). No Patient with no clinical signs of heart failure decompensations. Continue hydration with balanced electrolyte solutions.  Blood pressure monitoring    4. AKI  Stable renal function, with serum cr at 0,76, AKI has resolved, continue close electrolyte monitoring.  K is 3,8 and Mg is 2,2    5. Reactive leukopenia and thrombocytopenia/ severe calorie protein malnutrition Currently patient is NPO, plan to advance nutrition when ileus resolves.   6. Dyslipidemia. Resume statin when recovered from surgery and tolerating po well   Patient continue to be at high risk for worsening ileus  Status is: Inpatient  Remains inpatient appropriate because: Iv fluids and NG tube      DVT prophylaxis:  Enoxaparin   Code Status:    full  Family Communication:   No family at the bedside      Nutrition Status: Nutrition Problem: Severe Malnutrition Etiology: chronic illness (CHF) Signs/Symptoms: severe muscle depletion, severe fat depletion Interventions: TPN  Consultants:  Surgery   Procedures:  Exploratory laparotomy with lysis of adhesions     Subjective: Patient with no nausea or vomiting, NG tube is in place, abdominal pain had improved but  not back to baseline, continue to need analgesics.,   Objective: Vitals:   08/09/21 2154 08/09/21 2355 08/10/21 0610 08/10/21 0842  BP: (!) 141/85 138/85 138/90 112/82  Pulse: 85 88 87 87  Resp: 18 18 18 18   Temp: 98.4 F (36.9 C) 98.7 F (37.1 C) (!) 97.5 F (36.4 C) 98.4 F (36.9 C)  TempSrc: Oral Oral Oral Oral  SpO2: 98% 97% 98% 98%  Weight:      Height:        Intake/Output Summary (Last 24 hours) at 08/10/2021 1151 Last data filed at 08/10/2021 1056 Gross per 24 hour  Intake 1558.75 ml  Output 600 ml  Net 958.75 ml   Filed Weights   08/03/21 1038 08/09/21 0900  Weight: 81.6 kg 81.6 kg    Examination:   General: Not in pain or dyspnea Neurology: Awake and alert, non focal  E ENT: mild pallor, no icterus, oral mucosa moist NG tube in place.  Cardiovascular: No JVD. S1-S2 present, rhythmic, no gallops, rubs, or murmurs. No lower extremity edema. Pulmonary: positive breath sounds bilaterally, adequate air movement, no wheezing, rhonchi or rales. Gastrointestinal. Abdomen mild distended , not tender to superficial palpation Skin. No rashes Musculoskeletal: no joint deformities     Data Reviewed: I have personally reviewed following labs and imaging studies  CBC: Recent Labs  Lab 08/06/21 0814 08/06/21 1553 08/07/21 0016 08/08/21 0909 08/09/21 0203 08/10/21 0703  WBC 2.2* 2.5* 2.7* 3.4* 3.5* 5.9  NEUTROABS 1.3*  --   --   --   --   --   HGB 15.0 14.2 14.5 15.2 14.7 14.4  HCT 46.0 43.3 44.6 44.9 43.8 42.6  MCV 97.9 97.5 98.2 96.1 96.1 96.4  PLT 105* 167 166 170 214 [id number]   Basic Metabolic Panel: Recent Labs  Lab 08/05/21 1315 08/06/21 0814 08/07/21 0016 08/08/21 0142 08/09/21 0203 08/10/21 0703  NA 139 140 141 140 141 138  K 4.0 4.2 4.2 4.1 4.4 3.8  CL 104 104 104 104 100 101  CO2 27 26 28 25 30  32  GLUCOSE 125* 104* 106* 100* 109* 158*  BUN 39* 29* 22 18 17 14   CREATININE 1.08 0.88 0.86 0.77 0.89 0.76  CALCIUM 8.8* 8.8* 8.9 8.9 9.2 8.5*   MG 2.6*  --   --   --  2.5* 2.2   GFR: Estimated Creatinine Clearance: 98.5 mL/min (by C-G formula based on SCr of 0.76 mg/dL). Liver Function Tests: Recent Labs  Lab 08/04/21 0214  AST 30  ALT 17  ALKPHOS 34*  BILITOT 0.6  PROT 6.8  ALBUMIN 3.6   No results for input(s): LIPASE, AMYLASE in the last 168 hours. No results for input(s): AMMONIA in the last 168 hours. Coagulation Profile: No results for input(s): INR, PROTIME in the last 168 hours. Cardiac Enzymes: No results for input(s): CKTOTAL, CKMB, CKMBINDEX, TROPONINI in the last 168 hours. BNP (last 3 results) No results for input(s): PROBNP in the last 8760 hours. HbA1C: No results for input(s): HGBA1C in the last 72 hours. CBG: Recent Labs  Lab 08/07/21 0800 08/07/21 2106 08/08/21 0750 08/08/21 2053 08/09/21 2159  GLUCAP 86 98 104* 96 178*   Lipid Profile: No results for input(s): CHOL, HDL, LDLCALC, TRIG, CHOLHDL, LDLDIRECT in the last 72 hours. Thyroid Function  Tests: No results for input(s): TSH, T4TOTAL, FREET4, T3FREE, THYROIDAB in the last 72 hours. Anemia Panel: No results for input(s): VITAMINB12, FOLATE, FERRITIN, TIBC, IRON, RETICCTPCT in the last 72 hours.    Radiology Studies: I have reviewed all of the imaging during this hospital visit personally     Scheduled Meds:  aspirin EC  81 mg Oral Daily   enoxaparin (LOVENOX) injection  40 mg Subcutaneous Q24H   influenza vaccine adjuvanted  0.5 mL Intramuscular Tomorrow-1000   mouth rinse  15 mL Mouth Rinse BID   rosuvastatin  5 mg Oral Daily   Continuous Infusions:  dextrose 5% lactated ringers 75 mL/hr at 08/10/21 0647     LOS: 6 days        Shemia Bevel Gerome Apley, MD

## 2021-08-10 NOTE — Progress Notes (Signed)
Initial Nutrition Assessment  DOCUMENTATION CODES:   Severe malnutrition in context of chronic illness  INTERVENTION:   Recommend initiating TPN due to pt being NPO/CLD x 7 days and severely malnourished. Management per Pharmacy  NUTRITION DIAGNOSIS:   Severe Malnutrition related to chronic illness (CHF) as evidenced by severe muscle depletion, severe fat depletion.  GOAL:   Patient will meet greater than or equal to 90% of their needs  MONITOR:   Diet advancement, Labs, Skin, I & O's  REASON FOR ASSESSMENT:   NPO/Clear Liquid Diet    ASSESSMENT:   70 y.o. male presented to the ED with lower abdominal pain. PMH includes CHF, GERD, and HTN. Pt admitted with enteritis and an ileus; Flu+ on admission.   11/03: NPO; CLD 11/04: NPO 11/06: NG tube placed for LIS 11/09: OR for laparotomy  Pt with no improvement, surgery determined that patient may have a small bowel obstruction. Surgery took pt to the OR for exploratory laparotomy; found a small bowel obstruction and removed 1.8 L enteric contents.   Pt reports that he was eating well prior to admission. States that he would have some abdominal pain when eating. Pt reports a typical intake of breakfast: eggs, Lunch: sandwich, Dinner: fried chicken, rice, and broccoli. Pt reports that he lives with his wife and his wife does the cooking.   Pt reports a UBW of 160# and thinks that he has lost a little bit of weight but was unsure how much. Per EMR, pt has not has any weight loss within the past year. Pt reports that he uses a cane and a walker to ambulate at home.   Pt weight has significantly increased since the beginning of October. Weight increased 7.7 kg, suspect that pt is now fluid overloaded. Will use 73.9 kg as pt EDW.   Pt continued with NPO/CLD x 7 days; discussed initiating TPN with surgery PA.   Medications reviewed and include: IV antibiotics Labs reviewed.   NUTRITION - FOCUSED PHYSICAL EXAM:  Flowsheet Row  Most Recent Value  Orbital Region Severe depletion  Upper Arm Region Severe depletion  Thoracic and Lumbar Region Severe depletion  Buccal Region Severe depletion  Temple Region Severe depletion  Clavicle Bone Region Severe depletion  Clavicle and Acromion Bone Region Severe depletion  Scapular Bone Region Severe depletion  Dorsal Hand Moderate depletion  Patellar Region Moderate depletion  Anterior Thigh Region Moderate depletion  Posterior Calf Region Moderate depletion  Edema (RD Assessment) None  Hair Reviewed  Eyes Reviewed  Mouth Reviewed  Skin Reviewed  Nails Reviewed       Diet Order:   Diet Order             Diet NPO time specified Except for: Ice Chips  Diet effective now                   EDUCATION NEEDS:   No education needs have been identified at this time  Skin:  Skin Assessment: Skin Integrity Issues: Skin Integrity Issues:: Incisions Incisions: Abdomen  Last BM:  08/03/2021  Height:   Ht Readings from Last 1 Encounters:  08/09/21 6\' 1"  (1.854 m)    Weight:   Wt Readings from Last 1 Encounters:  08/09/21 81.6 kg    Ideal Body Weight:  83.6 kg  BMI:  Body mass index is 23.73 kg/m.  Estimated Nutritional Needs:   Kcal:  2300-2500  Protein:  115-130 grams  Fluid:  > 2 L    Erienne Spelman  Dannial Monarch, PLDN Clinical Dietitian See [facility] for contact information.

## 2021-08-10 NOTE — Progress Notes (Signed)
Mobility Specialist Progress Note:   08/10/21 1500  Mobility  Level of Assistance Independent  Mobility Response Tolerated well  Mobility performed by Mobility specialist  $Mobility charge 1 Mobility   Pt refused ambulation, agreed to bed exercises. Performed multiple BLE exercises, tolerated well.   Addison Lank Mobility Specialist  Phone [phone]

## 2021-08-10 NOTE — Progress Notes (Addendum)
PHARMACY - TOTAL PARENTERAL NUTRITION CONSULT NOTE   Indication: Small bowel obstruction  Assessment:  70 y.o. male with PMH significant for hypertension, CHF with LVEF ~25%, GERD, BPH, prior history of alcoholism who presented with complaints of lower abdominal pain found to have a SBO. Patient has remained NPO/CLD for 7 days and is now at risk of becoming severely malnourished. Pharmacy is consulted to initiate and manage new TPN. Per discussion with Surgery, will start new TPN 11/11.    Thank you for allowing pharmacy to be a part of this patient's care.  Thelma Barge, PharmD Clinical Pharmacist

## 2021-08-10 NOTE — Progress Notes (Signed)
Peripherally Inserted Central Catheter Placement  The IV Nurse has discussed with the patient and/or persons authorized to consent for the patient, the purpose of this procedure and the potential benefits and risks involved with this procedure.  The benefits include less needle sticks, lab draws from the catheter, and the patient may be discharged home with the catheter. Risks include, but not limited to, infection, bleeding, blood clot (thrombus formation), and puncture of an artery; nerve damage and irregular heartbeat and possibility to perform a PICC exchange if needed/ordered by physician.  Alternatives to this procedure were also discussed.  Bard Power PICC patient education guide, fact sheet on infection prevention and patient information card has been provided to patient /or left at bedside.    PICC Placement Documentation  PICC Double Lumen 08/10/21 PICC Right Brachial 41 cm 1 cm (Active)  Indication for Insertion or Continuance of Line Administration of hyperosmolar/irritating solutions (i.e. TPN, Vancomycin, etc.) 08/10/21 1524  Exposed Catheter (cm) 1 cm 08/10/21 1524  Site Assessment Clean;Dry;Intact 08/10/21 1524  Lumen #1 Status Flushed;Blood return noted;Saline locked 08/10/21 1524  Lumen #2 Status Flushed;Blood return noted;Saline locked 08/10/21 1524  Dressing Type Transparent 08/10/21 1524  Dressing Status Clean;Dry;Intact 08/10/21 1524  Antimicrobial disc in place? Yes 08/10/21 1524  Dressing Change Due 08/17/21 08/10/21 1524       Audrie Gallus 08/10/2021, 3:26 PM

## 2021-08-10 NOTE — Progress Notes (Signed)
Physical Therapy Treatment Patient Details Name: [name] MRN: [id number] DOB: [birth year] Today's Date: 08/10/2021   History of Present Illness Patient is a 70 y/o male who presents on 08/03/21 due to abdominal pain and diarrhea. CT abdomen/pelvis- dilated fluid and air-filled small intestine consistent with small bowel obstruction. Per surgery, ilieus vs enteritis vs partial SBO. Exploratory lap 11/9.   PMH includes HTN, chronic systolic CHF- EF 17%, prior history of alcoholism    PT Comments    Pt admitted with above diagnosis. Pt was able to incr distance with ambulation.  Needs max encouragement at times.  Pt currently with functional limitations due to balance and endurance deficits. Pt will benefit from skilled PT to increase their independence and safety with mobility to allow discharge to the venue listed below.      Recommendations for follow up therapy are one component of a multi-disciplinary discharge planning process, led by the attending physician.  Recommendations may be updated based on patient status, additional functional criteria and insurance authorization.  Follow Up Recommendations  Home health PT     Assistance Recommended at Discharge Intermittent Supervision/Assistance  Equipment Recommendations  Rolling walker (2 wheels)    Recommendations for Other Services       Precautions / Restrictions Precautions Precautions: Fall Precaution Comments: NG tube Restrictions Weight Bearing Restrictions: No     Mobility  Bed Mobility Overal bed mobility: Needs Assistance Bed Mobility: Rolling;Sidelying to Sit Rolling: Min guard Sidelying to sit: Min guard Supine to sit: Min guard Sit to supine: Min guard Sit to sidelying: Min guard General bed mobility comments: min guard due to lines and pain    Transfers Overall transfer level: Needs assistance Equipment used: Rolling walker (2 wheels) Transfers: Sit to/from Stand Sit to Stand: Min guard            General transfer comment: min guard for transfer with cues for hand placement and a little steadying assist initially    Ambulation/Gait Ambulation/Gait assistance: Min guard;Min assist Gait Distance (Feet): 180 Feet Assistive device: Rolling walker (2 wheels) Gait Pattern/deviations: Step-through pattern;Decreased stride length Gait velocity: decreased Gait velocity interpretation: <1.8 ft/sec, indicate of risk for recurrent falls   General Gait Details: Slow, mostly steady gait with a few instances of unsteadiness esepcially when pt takes one hand off RW needing Min A for support. Cues for RW management.   Stairs             Wheelchair Mobility    Modified Rankin (Stroke Patients Only)       Balance Overall balance assessment: Needs assistance;History of Falls Sitting-balance support: Feet supported Sitting balance-Leahy Scale: Good     Standing balance support: Bilateral upper extremity supported Standing balance-Leahy Scale: Poor Standing balance comment: relies on UE support and external                            Cognition Arousal/Alertness: Awake/alert Behavior During Therapy: Flat affect Overall Cognitive Status: Difficult to assess                                 General Comments: difficult to understand and patient was fixated on abdominal pain        Exercises General Exercises - Upper Extremity Shoulder Flexion: AROM;Both;10 reps Shoulder Extension: AROM;Both;10 reps Shoulder ABduction: 10 reps;AROM;Both Shoulder ADduction: AROM;Both;10 reps General Exercises - Lower Extremity Ankle Circles/Pumps: AAROM;5  reps [employer] Sets: AROM;10 reps Long Arc Quad: AROM;Both;10 reps;Seated    General Comments General comments (skin integrity, edema, etc.): Pt did not want to sit up in chair but with encouragement he did so.      Pertinent Vitals/Pain Pain Assessment: Faces Faces Pain Scale: Hurts even more Pain Location:  abdomen Pain Descriptors / Indicators: Sore;Aching Pain Intervention(s): Limited activity within patient's tolerance;Monitored during session;Repositioned    Home Living                          Prior Function            PT Goals (current goals can now be found in the care plan section) Progress towards PT goals: Progressing toward goals    Frequency    Min 3X/week      PT Plan Discharge plan needs to be updated    Co-evaluation              AM-PAC PT "6 Clicks" Mobility   Outcome Measure  Help needed turning from your back to your side while in a flat bed without using bedrails?: None Help needed moving from lying on your back to sitting on the side of a flat bed without using bedrails?: None Help needed moving to and from a bed to a chair (including a wheelchair)?: A Little Help needed standing up from a chair using your arms (e.g., wheelchair or bedside chair)?: A Little Help needed to walk in hospital room?: A Little Help needed climbing 3-5 steps with a railing? : A Little 6 Click Score: 20    End of Session Equipment Utilized During Treatment: Gait belt Activity Tolerance: Patient limited by fatigue;Patient limited by pain Patient left: with call bell/phone within reach;in chair;with chair alarm set Nurse Communication: Mobility status PT Visit Diagnosis: Muscle weakness (generalized) (M62.81) Pain - part of body:  (abdomen)     Time: 1007-1030 PT Time Calculation (min) (ACUTE ONLY): 23 min  Charges:  $Gait Training: 8-22 mins $Therapeutic Exercise: 8-22 mins                     [name],PT Acute Rehab Services [phone] (pager)    [name] 08/10/2021, 2:34 PM

## 2021-08-10 NOTE — Progress Notes (Addendum)
Patient ID: [name], male   DOB: 11-Nov-1950, 70 y.o.   MRN: [id number] 1 Day Post-Op   Subjective: Having some expected abdominal pain post operatively and complains foley catheter is bothersome. He has not passed any flatus. No nausea or emesis. No respiratory complaints  Objective: Vital signs in last 24 hours: Temp:  [97.5 F (36.4 C)-98.7 F (37.1 C)] 97.5 F (36.4 C) (11/10 0610) Pulse Rate:  [70-98] 87 (11/10 0610) Resp:  [16-20] 18 (11/10 0610) BP: (128-170)/(84-116) 138/90 (11/10 0610) SpO2:  [95 %-100 %] 98 % (11/10 0610) Arterial Line BP: (200-213)/(88-102) 200/88 (11/09 1156) Weight:  [81.6 kg] 81.6 kg (11/09 0900) Last BM Date: 08/03/21  Intake/Output from previous day: 11/09 0701 - 11/10 0700 In: 2513.8 [I.V.:1713.8; IV Piggyback:800] Out: 760 [Urine:760] Intake/Output this shift: No intake/output data recorded.  PE: General: pleasant, WD, male who is laying in bed in NAD HEENT: head is normocephalic, atraumatic.  Mouth is pink and moist Heart: regular, rate, and rhythm. Palpable radial pulses bilaterally Lungs: CTAB. Respiratory effort nonlabored Abd: soft, mild TTP greatest along midline incision without rebound or guarding. Honeycomb dressing over midline incision with blood on distal portion, staples visible intact. Mildly distended. Hypoactive BS. NGT with dark green clear bilious output in tubing. Cannister is empty GU: foley catheter with clear yellow urine MSK: all 4 extremities are symmetrical with no cyanosis, clubbing, or edema. No calf TTP bilaterally Skin: warm and dry with no masses, lesions, or rashes Psych: A&Ox3 with an appropriate affect.     Lab Results: CBC  Recent Labs    08/09/21 0203 08/10/21 0703  WBC 3.5* 5.9  HGB 14.7 14.4  HCT 43.8 42.6  PLT 214 295    BMET Recent Labs    08/08/21 0142 08/09/21 0203  NA 140 141  K 4.1 4.4  CL 104 100  CO2 25 30  GLUCOSE 100* 109*  BUN 18 17  CREATININE 0.77 0.89  CALCIUM 8.9  9.2    PT/INR No results for input(s): LABPROT, INR in the last 72 hours. ABG No results for input(s): PHART, HCO3 in the last 72 hours.  Invalid input(s): PCO2, PO2  Studies/Results: DG Abd Portable 1V  Result Date: 08/09/2021 CLINICAL DATA:  Small-bowel obstruction EXAM: PORTABLE ABDOMEN - 1 VIEW COMPARISON:  08/08/2021 FINDINGS: Air-filled, dilated loops of small bowel are again identified. Measure up to 5.1 cm on this study. Probable tip of enteric tube at the edge of image. IMPRESSION: Persistent small bowel obstruction. Maximum dilatation measures decreased. Electronically Signed   By: Guadlupe Spanish M.D.   On: 08/09/2021 08:32    Anti-infectives: Anti-infectives (From admission, onward)    Start     Dose/Rate Route Frequency Ordered Stop   08/09/21 1000  ceFAZolin (ANCEF) IVPB 2g/100 mL premix        2 g 200 mL/hr over 30 Minutes Intravenous To Short Stay 08/09/21 0758 08/10/21 1000   08/04/21 2200  oseltamivir (TAMIFLU) capsule 30 mg        30 mg Oral 2 times daily 08/04/21 0822 08/09/21 0959   08/04/21 1000  oseltamivir (TAMIFLU) capsule 75 mg        75 mg Oral  Once 08/04/21 0825 08/04/21 1116   08/03/21 1645  cefTRIAXone (ROCEPHIN) 2 g in sodium chloride 0.9 % 100 mL IVPB  Status:  Discontinued        2 g 200 mL/hr over 30 Minutes Intravenous Every 24 hours 08/03/21 1631 08/06/21 1519   08/03/21 1645  metroNIDAZOLE (FLAGYL) IVPB 500 mg  Status:  Discontinued        500 mg 100 mL/hr over 60 Minutes Intravenous Every 12 hours 08/03/21 1631 08/06/21 1519       Assessment/Plan: Abdominal pain and diarrhea SBO POD1 s/p ex lap LOA by Dr. Janee Morn 11/9 with findings of dense adhesion near distal ileum down to the mesentery - continue NPO, NGT LIWS and AROBF. Minimal NGT output since OR yesterday but on exam this am not functioning properly. NGT flushed and repositioned and returned to LIWS with good results - encouraged ambulation and incentive spirometry use - remove  foley this am - multimodal pain control - approx 1 week since adequate PO intake - consider parenteral nutrition. Will discuss with MD  ID - ancef periop VTE - SCDs, lovenox FEN - NPO, NGT LIWS, IVF @ 75 ml/hr Foley - placed periop 11/9, d/c this am   AKI HTN HLD COPD CHF   LOS: 6 days   Eric Form, [facility] Surgery 08/10/2021, 9:08 AM Please see Amion for pager number during day hours 7:00am-4:30pm

## 2021-08-11 LAB — BASIC METABOLIC PANEL
Anion gap: 5 (ref 5–15)
BUN: 12 mg/dL (ref 8–23)
CO2: 30 mmol/L (ref 22–32)
Calcium: 8.5 mg/dL — ABNORMAL LOW (ref 8.9–10.3)
Chloride: 103 mmol/L (ref 98–111)
Creatinine, Ser: 0.68 mg/dL (ref 0.61–1.24)
GFR, Estimated: >60 mL/min (ref >60)
Glucose, Bld: 138 mg/dL — ABNORMAL HIGH (ref 70–99)
Potassium: 3.9 mmol/L (ref 3.5–5.1)
Sodium: 138 mmol/L (ref 135–145)

## 2021-08-11 LAB — GLUCOSE, CAPILLARY: Glucose-Capillary: 86 mg/dL (ref 70–99)

## 2021-08-11 MED ORDER — TRAVASOL 10 % IV SOLN
INTRAVENOUS | Status: AC
  Filled 2021-08-11: qty 575.3

## 2021-08-11 MED ORDER — INSULIN ASPART 100 UNIT/ML IJ SOLN
0.0000 [IU] | Freq: Four times a day (QID) | INTRAMUSCULAR | Status: DC
  Administered 2021-08-12 – 2021-08-14 (×9): 1 [IU] via SUBCUTANEOUS

## 2021-08-11 NOTE — Progress Notes (Signed)
Mobility Specialist Progress Note:   08/11/21 1150  Mobility  Range of Motion/Exercises All extremities  Mobility  (bed exercises)  Mobility Response Tolerated well  Mobility performed by Mobility specialist  $Mobility charge 1 Mobility   Pt refused ambulation, despite max encouragement. Performed bed exercises, all extremities. Tolerated well, encouraged ambulation.    Addison Lank Mobility Specialist  Phone [phone]

## 2021-08-11 NOTE — Progress Notes (Signed)
PHARMACY - TOTAL PARENTERAL NUTRITION CONSULT NOTE   Indication: Small bowel obstruction  Patient Measurements: Height: 6\' 1"  (185.4 cm) Weight: 81.6 kg (179 lb 14.3 oz) IBW/kg (Calculated) : 79.9 TPN AdjBW (KG): 81.6 Body mass index is 23.73 kg/m.  Usual Weight: 74 kg  Assessment:  70 y.o. male with PMH significant for hypertension, CHF with LVEF ~25%, GERD, BPH, prior history of alcoholism who presented with complaints of lower abdominal pain found to have a SBO.   Per RD's conversation with patient, he reported that he was eating well prior to admission. States that he would have some abdominal pain when eating. Pt reports a typical intake of breakfast: eggs, Lunch: sandwich, Dinner: fried chicken, rice, and broccoli. Pt reports that he lives with his wife and his wife does the cooking.    Since admission on 11/03, Patient has remained NPO/CLD, now for more than 7 days and is now at risk of becoming severely malnourished. Pharmacy is consulted to initiate and manage new TPN.   Pt baseline weight appears closer to 74kg. Per this admission, his weight has significantly increased to 82kg, roughly an increase of  > 6kg. Concern that patient is now fluid overloaded. Will use 74 kg as patient's EDW. Due to hx of CHF, will condense fluids in TPN as much as possible to avoid further fluid overload.    Glucose / Insulin: 138-158, no insulin or hx of DM but received x1 dose dexamethasone 11/09 Electrolytes: Na 138, K 3.9, Cl/CO2 wnl, CoCa 8.8 Renal: Scr 0.68 (@ bsl), BUN 12 Hepatic: 11/04 LFTs wnl Intake / Output; MIVF: NPO, 13/04 IVF; UOP , NG , LBM 11/03 GI Imaging: 11/09 CToA: Persistent small bowel obstruction GI Surgeries / Procedures: 11/09 exploratory laparotomy with lysis of adhesions     Central access: 11/10 PICC  TPN start date:  11/11  Nutritional Goals: Goal TPN rate is 94 mL/hr (provides 115 g of protein and 2400 kcals per day)  RD Assessment: Estimated  Needs Total Energy Estimated Needs: 2300-2500 Total Protein Estimated Needs: 115-130 grams Total Fluid Estimated Needs: > 2 L  Current Nutrition:  NPO  Plan:  Start TPN at 47 mL/hr at 1800 (50%) to provide 58g protein and 1200 kcals Electrolytes in TPN: Na 31mEq/L, K 23mEq/L, Ca 19mEq/L, Mg 49mEq/L, and Phos 40mmol/L. Cl:Ac 1:1 Add standard MVI and trace elements to TPN Initiate Sensitive q6h SSI and adjust as needed  Reduce MIVF to 10 mL/hr at 1800 when new TPN begins Monitor TPN labs on Mon/Thurs, will order BMET Mg Phos on Sat AM   Thank you for allowing pharmacy to be a part of this patient's care.  Sat, PharmD Clinical Pharmacist

## 2021-08-11 NOTE — Consult Note (Signed)
   [facility] Inpatient Consult   08/11/2021  [facility] 01-Jan-1951 [id number]  [facility] [[facility]]  Accountable Care Organization [ACO] Patient:  [name] Medicare  Primary Care Provider:  Sharon Seller, NP , [facility]   Patient is currently active with [facility] [[facility]] Care Management for chronic disease management services.  Patient has been engaged by a [facility] RN Health Coach who patient deferred contact to his [name] per notes.  Chart reviewed for progress and disposition.  Plan:  Follow up with Inpatient Transition Of Care [TOC] team member to make aware that [facility] Care Management following. Will follow for post hospital follow up referrals.  Of note, [facility] Care Management services does not replace or interfere with any services that are needed or arranged by inpatient [facility] care management team.  For additional questions or referrals please contact:  Charlesetta Shanks, RN BSN CCM [facility]  [phone] business mobile phone Toll free office [phone]  Fax number: [phone] [email or web address]

## 2021-08-11 NOTE — Progress Notes (Signed)
PROGRESS NOTE    [name]  WCH:[id number] DOB: Jul 25, 1951 DOA: 08/03/2021 PCP: Sharon Seller, NP    Brief Narrative:  [name] was admitted to the hospital with the working diagnosis of small bowel obstruction. Now sp exploratory laparotomy and lysis of adhesions.    70 year old male past medical history for hypertension, heart failure, GERD, BPH, history of alcohol abuse, who presented with lower abdominal pain, for 3 days.  Colicky intense abdominal pain radiating to his groin and genitalia.  Positive diarrhea.  On his initial physical examination he was afebrile, his blood pressure was 116/75, heart rate 87, respiratory rate 15, temperature 98.3, oxygen saturation 98%, his lungs were clear to auscultation bilaterally, heart S1-S2, present, rhythmic, the abdomen was soft, suprapubic and bilateral quadrant tenderness, no rebound or guarding, no lower extremity edema.   Sodium 138, potassium 3.9, chloride 102, bicarb 26, glucose 110, BUN 34, creatinine 1.59, white count 4.7, hemoglobin 14.7, hematocrit 42.8, platelets 166 Influenza A+, SARS COVID-19 negative.   CT of abdomen and pelvis showed small bowel obstruction, probably in the mid ileal region.  Multiple hepatic cysts and renal cysts   Patient was admitted to the medical ward, placed on supportive medical therapy.  Patient received oseltamavir for influenza.   He was diagnosed with enteritis, received ceftriaxone and metronidazole for antibiotic therapy. Surgery was consulted and recommended further medical therapy.   08/06/21 patient had no flatus, persistent abdominal distention, diagnosed with worsening small bowel obstruction and nasogastric tube was placed.   11/08 because not improving small bowel obstruction, patient was taken to the OR for exploratory laparotomy with lysis of adhesions  Patient with postoperative ileus, continue to have Ng tube in place.    Assessment & Plan:   Principal Problem:    Enteritis Active Problems:   Alcohol abuse   Diarrhea   Chronic systolic CHF (congestive heart failure) (HCC)   Cocaine use disorder, moderate, dependence (HCC)   Benign prostatic hyperplasia with weak urinary stream   AKI (acute kidney injury) (HCC)   Protein-calorie malnutrition, severe   Small bowel obstruction now Sp exploratory laparotomy with lysis of adhesions. Post operative ileus.  Abdominal pain is controlled with analgesics, continue to have NG tube in place.  No nausea or vomiting, positive flatus and bowel movement per patient report.    On PRN antiemetics  and analgesics.  Continue with antiacid therapy with pantoprazole. Patient has been started on TPN per surgery, will discontinue IV fluids.   Follow with surgery recommendations.    2. Influenza A. Patient with no dyspnea, cmplteted antiviral therapy with oseltamivir.    3. Chronic systolic heart failure (EF LV 20 to 25% from 2017).  No signs of volume overload.  Continue blood pressure monitoring.  Discontinue IV fluids to prevent heart failure decompensation.    4. AKI  Renal function is stable, K is 3,9, bicarbonate is 30 and serum cr is 0,68.  Continue to follow electrolytes.    5. Reactive leukopenia and thrombocytopenia/ severe calorie protein malnutrition Plan to resume nutrition when ileus resolves.    6. Dyslipidemia. Resume statin when recovered from surgery and tolerating po well   Patient continue to be at high risk for worsening ileus.   Status is: Inpatient  Remains inpatient appropriate because: IV fluids and surgery follow up.   DVT prophylaxis: Enoxaparin   Code Status:    full  Family Communication:   No family at the bedside      Nutrition Status: Nutrition  Problem: Severe Malnutrition Etiology: chronic illness (CHF) Signs/Symptoms: severe muscle depletion, severe fat depletion Interventions: TPN   Consultants:  Surgery    Procedures:  Exploratory laparotomy with lysis of  adhesions      Subjective: Patient continue to have abdominal pain, improved with analgesics, no nausea or vomiting. No chest pain or dyspnea.   Objective: Vitals:   08/10/21 1647 08/10/21 2039 08/11/21 0533 08/11/21 0811  BP: 122/84 (!) 144/94 132/89 116/83  Pulse:  89 76 85  Resp: 18 19 18 18   Temp: 98.6 F (37 C) 98.5 F (36.9 C) 98 F (36.7 C) 97.7 F (36.5 C)  TempSrc: Oral Oral Oral Oral  SpO2:  100% 98% 99%  Weight:      Height:        Intake/Output Summary (Last 24 hours) at 08/11/2021 0920 Last data filed at 08/11/2021 0528 Gross per 24 hour  Intake 1544.54 ml  Output 300 ml  Net 1244.54 ml   Filed Weights   08/03/21 1038 08/09/21 0900  Weight: 81.6 kg 81.6 kg    Examination:   General: Not in pain or dyspnea  Neurology: Awake and alert, non focal. NG tube in place.  E ENT: mild pallor, no icterus, oral mucosa moist Cardiovascular: No JVD. S1-S2 present, rhythmic, no gallops, rubs, or murmurs. No lower extremity edema. Pulmonary: Positive breath sounds bilaterally, adequate air movement, no wheezing, rhonchi or rales. Gastrointestinal. Abdomen mild distended, no tender to superficial palpation. Surgical wound with dressing in place.  Skin. No rashes Musculoskeletal: no joint deformities     Data Reviewed: I have personally reviewed following labs and imaging studies  CBC: Recent Labs  Lab 08/06/21 0814 08/06/21 1553 08/07/21 0016 08/08/21 0909 08/09/21 0203 08/10/21 0703  WBC 2.2* 2.5* 2.7* 3.4* 3.5* 5.9  NEUTROABS 1.3*  --   --   --   --   --   HGB 15.0 14.2 14.5 15.2 14.7 14.4  HCT 46.0 43.3 44.6 44.9 43.8 42.6  MCV 97.9 97.5 98.2 96.1 96.1 96.4  PLT 105* 167 166 170 214 [id number]   Basic Metabolic Panel: Recent Labs  Lab 08/05/21 1315 08/06/21 0814 08/07/21 0016 08/08/21 0142 08/09/21 0203 08/10/21 0703 08/11/21 0126  NA 139   < > 141 140 141 138 138  K 4.0   < > 4.2 4.1 4.4 3.8 3.9  CL 104   < > 104 104 100 101 103  CO2 27   < >  28 25 30  32 30  GLUCOSE 125*   < > 106* 100* 109* 158* 138*  BUN 39*   < > 22 18 17 14 12   CREATININE 1.08   < > 0.86 0.77 0.89 0.76 0.68  CALCIUM 8.8*   < > 8.9 8.9 9.2 8.5* 8.5*  MG 2.6*  --   --   --  2.5* 2.2  --    < > = values in this interval not displayed.   GFR: Estimated Creatinine Clearance: 98.5 mL/min (by C-G formula based on SCr of 0.68 mg/dL). Liver Function Tests: No results for input(s): AST, ALT, ALKPHOS, BILITOT, PROT, ALBUMIN in the last 168 hours. No results for input(s): LIPASE, AMYLASE in the last 168 hours. No results for input(s): AMMONIA in the last 168 hours. Coagulation Profile: No results for input(s): INR, PROTIME in the last 168 hours. Cardiac Enzymes: No results for input(s): CKTOTAL, CKMB, CKMBINDEX, TROPONINI in the last 168 hours. BNP (last 3 results) No results for input(s): PROBNP in the last  8760 hours. HbA1C: No results for input(s): HGBA1C in the last 72 hours. CBG: Recent Labs  Lab 08/07/21 2106 08/08/21 0750 08/08/21 2053 08/09/21 2159 08/10/21 2037  GLUCAP 98 104* 96 178* 153*   Lipid Profile: No results for input(s): CHOL, HDL, LDLCALC, TRIG, CHOLHDL, LDLDIRECT in the last 72 hours. Thyroid Function Tests: No results for input(s): TSH, T4TOTAL, FREET4, T3FREE, THYROIDAB in the last 72 hours. Anemia Panel: No results for input(s): VITAMINB12, FOLATE, FERRITIN, TIBC, IRON, RETICCTPCT in the last 72 hours.    Radiology Studies: I have reviewed all of the imaging during this hospital visit personally     Scheduled Meds:  acetaminophen  1,000 mg Oral Q6H   aspirin EC  81 mg Oral Daily   Chlorhexidine Gluconate Cloth  6 each Topical Daily   enoxaparin (LOVENOX) injection  40 mg Subcutaneous Q24H   influenza vaccine adjuvanted  0.5 mL Intramuscular Tomorrow-1000   mouth rinse  15 mL Mouth Rinse BID   rosuvastatin  5 mg Oral Daily   sodium chloride flush  10-40 mL Intracatheter Q12H   Continuous Infusions:  dextrose 5%  lactated ringers 75 mL/hr at 08/11/21 0432     LOS: 7 days        Waynetta Metheny Gerome Apley, MD

## 2021-08-11 NOTE — Progress Notes (Signed)
Patient ID: [name], male   DOB: Nov 26, 1950, 70 y.o.   MRN: [id number] 2 Days Post-Op   Subjective: Abdominal pain is stable. Still no flatus or BM. He states he ambulated in hallway yesterday. No nausea or emesis with NGT. Voiding without complaints  Objective: Vital signs in last 24 hours: Temp:  [97.7 F (36.5 C)-98.6 F (37 C)] 97.7 F (36.5 C) (11/11 0811) Pulse Rate:  [76-89] 85 (11/11 0811) Resp:  [18-19] 18 (11/11 0811) BP: (112-144)/(82-94) 116/83 (11/11 0811) SpO2:  [98 %-100 %] 99 % (11/11 0811) Last BM Date: 08/03/21  Intake/Output from previous day: 11/10 0701 - 11/11 0700 In: 1544.5 [I.V.:1544.5] Out: 500 [Urine:400; Emesis/NG output:100] Intake/Output this shift: No intake/output data recorded.  PE: General: pleasant, WD, male who is laying in bed in NAD HEENT: head is normocephalic, atraumatic.  Mouth is pink and moist Heart: regular, rate, and rhythm. Palpable radial pulses bilaterally Lungs: CTAB. Respiratory effort nonlabored Abd: soft, mild TTP greatest along midline incision without rebound or guarding. Honeycomb dressing over midline incision with blood on distal portion, staples visible intact. Very mildly distended. Hypoactive BS. NGT with dark green clear bilious output in tubing. 300 ml in cannister MSK: all 4 extremities are symmetrical with no cyanosis, clubbing, or edema. No calf TTP bilaterally Skin: warm and dry with no masses, lesions, or rashes Psych: A&Ox3 with an appropriate affect.     Lab Results: CBC  Recent Labs    08/09/21 0203 08/10/21 0703  WBC 3.5* 5.9  HGB 14.7 14.4  HCT 43.8 42.6  PLT 214 295    BMET Recent Labs    08/10/21 0703 08/11/21 0126  NA 138 138  K 3.8 3.9  CL 101 103  CO2 32 30  GLUCOSE 158* 138*  BUN 14 12  CREATININE 0.76 0.68  CALCIUM 8.5* 8.5*    PT/INR No results for input(s): LABPROT, INR in the last 72 hours. ABG No results for input(s): PHART, HCO3 in the last 72 hours.  Invalid  input(s): PCO2, PO2  Studies/Results: [place] EKG SITE RITE  Result Date: 08/10/2021 If Site Rite image not attached, placement could not be confirmed due to current cardiac rhythm.   Anti-infectives: Anti-infectives (From admission, onward)    Start     Dose/Rate Route Frequency Ordered Stop   08/09/21 1000  ceFAZolin (ANCEF) IVPB 2g/100 mL premix  Status:  Discontinued        2 g 200 mL/hr over 30 Minutes Intravenous To Short Stay 08/09/21 0758 08/10/21 1000   08/04/21 2200  oseltamivir (TAMIFLU) capsule 30 mg        30 mg Oral 2 times daily 08/04/21 0822 08/09/21 0959   08/04/21 1000  oseltamivir (TAMIFLU) capsule 75 mg        75 mg Oral  Once 08/04/21 0825 08/04/21 1116   08/03/21 1645  cefTRIAXone (ROCEPHIN) 2 g in sodium chloride 0.9 % 100 mL IVPB  Status:  Discontinued        2 g 200 mL/hr over 30 Minutes Intravenous Every 24 hours 08/03/21 1631 08/06/21 1519   08/03/21 1645  metroNIDAZOLE (FLAGYL) IVPB 500 mg  Status:  Discontinued        500 mg 100 mL/hr over 60 Minutes Intravenous Every 12 hours 08/03/21 1631 08/06/21 1519       Assessment/Plan: SBO POD2 s/p ex lap LOA by Dr. Janee Morn 11/9 with findings of dense adhesion near distal ileum down to the mesentery - continue NPO, NGT LIWS and  AROBF. Monitor NG output - 100 ml out yesterday charted. 300 mL in cannister this am - encouraged ambulation and incentive spirometry use - multimodal pain control - PICC/TNA today  ID - ancef periop VTE - SCDs, lovenox FEN - NPO, NGT LIWS, IVF @ 75 ml/hr Foley - placed periop 11/9, d/c 11/10   AKI HTN HLD COPD CHF   LOS: 7 days   Eric Form, [facility] Surgery 08/11/2021, 8:17 AM Please see Amion for pager number during day hours 7:00am-4:30pm

## 2021-08-12 DIAGNOSIS — J101 Influenza due to other identified influenza virus with other respiratory manifestations: Secondary | ICD-10-CM

## 2021-08-12 DIAGNOSIS — I700 Atherosclerosis of aorta: Secondary | ICD-10-CM

## 2021-08-12 DIAGNOSIS — K56609 Unspecified intestinal obstruction, unspecified as to partial versus complete obstruction: Secondary | ICD-10-CM

## 2021-08-12 LAB — COMPREHENSIVE METABOLIC PANEL
ALT: 18 U/L (ref 0–44)
AST: 24 U/L (ref 15–41)
Albumin: 2.7 g/dL — ABNORMAL LOW (ref 3.5–5.0)
Alkaline Phosphatase: 25 U/L — ABNORMAL LOW (ref 38–126)
Anion gap: 4 — ABNORMAL LOW (ref 5–15)
BUN: 13 mg/dL (ref 8–23)
CO2: 29 mmol/L (ref 22–32)
Calcium: 8.6 mg/dL — ABNORMAL LOW (ref 8.9–10.3)
Chloride: 103 mmol/L (ref 98–111)
Creatinine, Ser: 0.69 mg/dL (ref 0.61–1.24)
GFR, Estimated: >60 mL/min (ref >60)
Glucose, Bld: 126 mg/dL — ABNORMAL HIGH (ref 70–99)
Potassium: 3.7 mmol/L (ref 3.5–5.1)
Sodium: 136 mmol/L (ref 135–145)
Total Bilirubin: 0.3 mg/dL (ref 0.3–1.2)
Total Protein: 5.8 g/dL — ABNORMAL LOW (ref 6.5–8.1)

## 2021-08-12 LAB — GLUCOSE, CAPILLARY
Glucose-Capillary: 108 mg/dL — ABNORMAL HIGH (ref 70–99)
Glucose-Capillary: 121 mg/dL — ABNORMAL HIGH (ref 70–99)
Glucose-Capillary: 131 mg/dL — ABNORMAL HIGH (ref 70–99)
Glucose-Capillary: 150 mg/dL — ABNORMAL HIGH (ref 70–99)

## 2021-08-12 LAB — MAGNESIUM: Magnesium: 2.3 mg/dL (ref 1.7–2.4)

## 2021-08-12 LAB — PHOSPHORUS: Phosphorus: 2.9 mg/dL (ref 2.5–4.6)

## 2021-08-12 LAB — TRIGLYCERIDES: Triglycerides: 67 mg/dL (ref <150)

## 2021-08-12 MED ORDER — TRAVASOL 10 % IV SOLN
INTRAVENOUS | Status: AC
  Filled 2021-08-12: qty 1166.4

## 2021-08-12 NOTE — Progress Notes (Signed)
  Progress Note [name]   JHE:[id number]  DOB: 05-11-1951  DOA: 08/03/2021     8 Date of Service: 08/12/2021   Clinical Course 70 year old man presenting with abdominal pain.  Admitted for small bowel obstruction, followed by general surgery, underwent exploratory laparotomy and lysis of adhesions.  NG tube remains in place, on TPN.  Assessment and Plan Small bowel obstruction secondary to adhesions, now status post laparotomy --Continues on TPN, NG tube in place.  Management per surgery.  Influenza A --Appears resolved.  Completed treatment with oseltamivir.  Chronic systolic CHF --Appears stable, monitor volume status  Aortic Atherosclerosis (ICD10-I70.0).  Subjective:  Feels okay today.  No flatus or bowel movement yet.    Objective Vitals:   08/12/21 0452 08/12/21 0802 08/12/21 0830 08/12/21 1603  BP: 124/85 124/87  139/84  Pulse: 80 64 71 74  Resp: 18 17  17   Temp: 99.5 F (37.5 C) 98.2 F (36.8 C)  97.7 F (36.5 C)  TempSrc: Oral Oral  Oral  SpO2: 98% (!) 84% 98% 99%  Weight:      Height:       70 kg  Vital signs were reviewed and unremarkable.  Exam Physical Exam Constitutional:      General: He is not in acute distress.    Appearance: He is not ill-appearing.  Cardiovascular:     Rate and Rhythm: Normal rate and regular rhythm.     Heart sounds: No murmur heard. Pulmonary:     Effort: Pulmonary effort is normal. No respiratory distress.     Breath sounds: No wheezing, rhonchi or rales.  Neurological:     Mental Status: He is alert.  Psychiatric:        Mood and Affect: Mood normal.        Behavior: Behavior normal.    Labs / Other Information My review of labs, imaging, notes and other tests shows no new significant findings.  CMP unremarkable with normal phosphorus and magnesium.  Disposition Plan: Status is: Inpatient  Remains inpatient appropriate because: NG tube in place, n.p.o., on TPN.  Time spent: 20 minutes [facility] 08/12/2021, 7:13 PM

## 2021-08-12 NOTE — Progress Notes (Signed)
3 Days Post-Op   Subjective/Chief Complaint: Pt complains of sore throat   No flatus or BM  Objective: Vital signs in last 24 hours: Temp:  [98 F (36.7 C)-99.5 F (37.5 C)] 98.2 F (36.8 C) (11/12 0802) Pulse Rate:  [64-85] 71 (11/12 0830) Resp:  [16-18] 17 (11/12 0802) BP: (124-137)/(85-88) 124/87 (11/12 0802) SpO2:  [84 %-99 %] 98 % (11/12 0830) Weight:  [70 kg] 70 kg (11/11 1700) Last BM Date: 08/03/21  Intake/Output from previous day: 11/11 0701 - 11/12 0700 In: 562.3 [I.V.:562.3] Out: 650 [Urine:250; Emesis/NG output:400] Intake/Output this shift: No intake/output data recorded.   General: pleasant, WD, male who is laying in bed in NAD HEENT: head is normocephalic, atraumatic.  Mouth is pink and moist Heart: regular, rate, and rhythm. Palpable radial pulses bilaterally Lungs: CTAB. Respiratory effort nonlabored Abd: soft, mild TTP greatest along midline incision without rebound or guarding. Honeycomb dressing over midline incision with blood on distal portion, staples visible intact. Very mildly distended.   MSK: all 4 extremities are symmetrical with no cyanosis, clubbing, or edema. No calf TTP bilaterally Psych: A&Ox3 with an appropriate affect.  Lab Results:  Recent Labs    08/10/21 0703  WBC 5.9  HGB 14.4  HCT 42.6  PLT 295   BMET Recent Labs    08/11/21 0126 08/12/21 0321  NA 138 136  K 3.9 3.7  CL 103 103  CO2 30 29  GLUCOSE 138* 126*  BUN 12 13  CREATININE 0.68 0.69  CALCIUM 8.5* 8.6*   PT/INR No results for input(s): LABPROT, INR in the last 72 hours. ABG No results for input(s): PHART, HCO3 in the last 72 hours.  Invalid input(s): PCO2, PO2  Studies/Results: [place] EKG SITE RITE  Result Date: 08/10/2021 If Site Rite image not attached, placement could not be confirmed due to current cardiac rhythm.   Anti-infectives: Anti-infectives (From admission, onward)    Start     Dose/Rate Route Frequency Ordered Stop   08/09/21 1000   ceFAZolin (ANCEF) IVPB 2g/100 mL premix  Status:  Discontinued        2 g 200 mL/hr over 30 Minutes Intravenous To Short Stay 08/09/21 0758 08/10/21 1000   08/04/21 2200  oseltamivir (TAMIFLU) capsule 30 mg        30 mg Oral 2 times daily 08/04/21 0822 08/09/21 0959   08/04/21 1000  oseltamivir (TAMIFLU) capsule 75 mg        75 mg Oral  Once 08/04/21 0825 08/04/21 1116   08/03/21 1645  cefTRIAXone (ROCEPHIN) 2 g in sodium chloride 0.9 % 100 mL IVPB  Status:  Discontinued        2 g 200 mL/hr over 30 Minutes Intravenous Every 24 hours 08/03/21 1631 08/06/21 1519   08/03/21 1645  metroNIDAZOLE (FLAGYL) IVPB 500 mg  Status:  Discontinued        500 mg 100 mL/hr over 60 Minutes Intravenous Every 12 hours 08/03/21 1631 08/06/21 1519       Assessment/Plan: s/p Procedure(s): EXPLORATORY LAPAROTOMY , bowel obstruction (N/A) SBO POD3  s/p ex lap LOA by Dr. Janee Morn 11/9 with findings of dense adhesion near distal ileum down to the mesentery  - continue NPO, NGT LIWS and AROBF. Monitor NG output - 400 ml out yesterday -CHLOROSEPTIC SPRAY prn - encouraged ambulation and incentive spirometry use - multimodal pain control - PICC/TNA    ID - ancef periop VTE - SCDs, lovenox FEN - NPO, NGT LIWS, IVF @ 75 ml/hr Foley -  placed periop 11/9, d/c 11/10   AKI HTN HLD COPD CHF        LOS: 8 days    [name] 08/12/2021

## 2021-08-12 NOTE — Hospital Course (Addendum)
70 year old man presenting with abdominal pain.  Admitted for suspected enteritis, ileus, seen by general surgery.  Failed to improve with conservative management and small bowel protocol, subsequent diagnosis small bowel obstruction, underwent exploratory laparotomy 11/9 which revealed small bowel entrapped by dense adhesions which were lysed.  Started on TPN.  Bowel function returning 11/14, diet advanced, NG tube out.  Weaning TPN.  Hopefully home 2 to 4 days.

## 2021-08-12 NOTE — Progress Notes (Signed)
PHARMACY - TOTAL PARENTERAL NUTRITION CONSULT NOTE   Indication: Small bowel obstruction  Patient Measurements: Height: 6\' 1"  (185.4 cm) Weight: 70 kg (154 lb 5.2 oz) IBW/kg (Calculated) : 79.9 TPN AdjBW (KG): 70 Body mass index is 20.36 kg/m. Usual Weight: 74 kg  Assessment:  70 y.o. male with PMH significant for hypertension, CHF with LVEF ~25%, GERD, BPH, prior history of alcoholism who presented with complaints of lower abdominal pain found to have SBO.   Per RD's conversation with patient, reported he was eating well prior to admission. States he would have abdominal pain when eating. Pt reports typical intake of breakfast: eggs, Lunch: sandwich, Dinner: fried chicken, rice, and broccoli.  Patient has remained NPO/CLD, now for more than 7 days and at risk of becoming severely malnourished. Pharmacy is consulted to initiate TPN.  Glucose / Insulin: no hx of DM, CBGs controlled - utilized 2 units SSI since TPN start Noted Decadron given x 1 on 11/9 Electrolytes: all WNL Renal: Scr stable WNL, BUN WNL Hepatic: LFTs / Tbili / TG WNL, albumin 2.7 Intake / Output; MIVF: UOP appears inaccurately charted, NG output 400 mL, LBM 11/3 GI Imaging: 11/9 CToA: Persistent small bowel obstruction GI Surgeries / Procedures: 11/9 exploratory laparotomy with lysis of adhesions    Central access: 11/10 PICC  TPN start date:  11/11  Nutritional Goals: Goal TPN rate is 90 mL/hr (provides 117 g of protein and 2334 kcals per day)  RD Assessment: Estimated Needs Total Energy Estimated Needs: 2300-2500 Total Protein Estimated Needs: 115-130 grams Total Fluid Estimated Needs: > 2 L  Current Nutrition:  NPO; TPN  Plan:  Increase TPN to goal rate 90 ml/hr at 1800 with patient tolerating TPN with no issues. Electrolytes in TPN: continue standard - Na 67mEq/L, K 73mEq/L, Ca 35mEq/L, Mg 26mEq/L, and Phos 22mmol/L. Cl:Ac 1:1 Add standard MVI and trace elements to TPN Continue Sensitive q6h SSI and  adjust as needed Monitor TPN labs on Mon/Thurs and daily as needed - ordered AM labs   12m, PharmD, BCPS Please check AMION for all [facility] contact numbers Clinical Pharmacist 08/12/2021 9:03 AM

## 2021-08-13 DIAGNOSIS — D72819 Decreased white blood cell count, unspecified: Secondary | ICD-10-CM

## 2021-08-13 LAB — BASIC METABOLIC PANEL
Anion gap: 7 (ref 5–15)
BUN: 14 mg/dL (ref 8–23)
CO2: 27 mmol/L (ref 22–32)
Calcium: 8.6 mg/dL — ABNORMAL LOW (ref 8.9–10.3)
Chloride: 101 mmol/L (ref 98–111)
Creatinine, Ser: 0.63 mg/dL (ref 0.61–1.24)
GFR, Estimated: >60 mL/min (ref >60)
Glucose, Bld: 96 mg/dL (ref 70–99)
Potassium: 4.1 mmol/L (ref 3.5–5.1)
Sodium: 135 mmol/L (ref 135–145)

## 2021-08-13 LAB — GLUCOSE, CAPILLARY
Glucose-Capillary: 127 mg/dL — ABNORMAL HIGH (ref 70–99)
Glucose-Capillary: 144 mg/dL — ABNORMAL HIGH (ref 70–99)
Glucose-Capillary: 145 mg/dL — ABNORMAL HIGH (ref 70–99)
Glucose-Capillary: 146 mg/dL — ABNORMAL HIGH (ref 70–99)

## 2021-08-13 LAB — MAGNESIUM: Magnesium: 2.2 mg/dL (ref 1.7–2.4)

## 2021-08-13 LAB — PHOSPHORUS: Phosphorus: 3.6 mg/dL (ref 2.5–4.6)

## 2021-08-13 MED ORDER — TRAVASOL 10 % IV SOLN
INTRAVENOUS | Status: AC
  Filled 2021-08-13: qty 1166.4

## 2021-08-13 NOTE — Assessment & Plan Note (Signed)
--  continue statin on discharge

## 2021-08-13 NOTE — Plan of Care (Signed)
  Problem: Activity: Goal: Risk for activity intolerance will decrease Outcome: Progressing   Problem: Elimination: Goal: Will not experience complications related to bowel motility Outcome: Not Progressing   Problem: Elimination: Goal: Will not experience complications related to urinary retention Outcome: Progressing   Problem: Pain Managment: Goal: General experience of comfort will improve Outcome: Progressing

## 2021-08-13 NOTE — Assessment & Plan Note (Addendum)
--   Status post exploratory laparotomy 11/9, remains on TPN, diet but nauseated this morning with a little bit of emesis and increased distention and pain.  Continue management per general surgery.

## 2021-08-13 NOTE — Progress Notes (Signed)
PHARMACY - TOTAL PARENTERAL NUTRITION CONSULT NOTE   Indication: Small bowel obstruction  Patient Measurements: Height: 6\' 1"  (185.4 cm) Weight: 71.2 kg (156 lb 15.5 oz) IBW/kg (Calculated) : 79.9 TPN AdjBW (KG): 70 Body mass index is 20.71 kg/m. Usual Weight: 74 kg  Assessment:  70 y.o. male with PMH significant for hypertension, CHF with LVEF ~25%, GERD, BPH, prior history of alcoholism who presented with complaints of lower abdominal pain found to have SBO.   Per RD's conversation with patient, reported he was eating well prior to admission. States he would have abdominal pain when eating. Pt reports typical intake of breakfast: eggs, Lunch: sandwich, Dinner: fried chicken, rice, and broccoli.  Patient has remained NPO/CLD, now for more than 7 days and at risk of becoming severely malnourished. Pharmacy is consulted to initiate TPN.  Glucose / Insulin: no hx of DM, CBGs controlled - utilized 3 units SSI since TPN start Noted Decadron given x 1 on 11/9 Electrolytes: all WNL Renal: Scr stable WNL, BUN WNL Hepatic: LFTs / Tbili / TG WNL, albumin 2.7 Intake / Output; MIVF: UOP appears inaccurately charted, NG output 1000 mL, LBM 11/3, +flatus reported 11/13 GI Imaging: 11/9 CToA: Persistent small bowel obstruction GI Surgeries / Procedures: 11/9 exploratory laparotomy with lysis of adhesions    Central access: 11/10 PICC  TPN start date:  11/11  Nutritional Goals: Goal TPN rate is 90 mL/hr (provides 117 g of protein and 2334 kcals per day)  RD Assessment: Estimated Needs Total Energy Estimated Needs: 2300-2500 Total Protein Estimated Needs: 115-130 grams Total Fluid Estimated Needs: > 2 L  Current Nutrition:  NPO; TPN  Plan:  Continue TPN at goal rate 90 ml/hr at 1800 Electrolytes in TPN: continue standard - Na 69mEq/L, K 68mEq/L, Ca 84mEq/L, Mg 90mEq/L, and Phos 11mmol/L. Cl:Ac 1:1 Add standard MVI and trace elements to TPN Continue Sensitive q6h SSI and adjust as needed  - consider d/c 11/14 if continues to require low usage Monitor TPN labs on Mon/Thurs F/u Surgery plans - NGT clamping trial today   12/14, PharmD, BCPS Please check AMION for all [facility] contact numbers Clinical Pharmacist 08/13/2021 7:29 AM

## 2021-08-13 NOTE — Progress Notes (Signed)
4 Days Post-Op   Subjective/Chief Complaint: PT WITH FLATUS No  BM yet  Pain is controlled      Objective: Vital signs in last 24 hours: Temp:  [97.7 F (36.5 C)-98.9 F (37.2 C)] 98.5 F (36.9 C) (11/13 0839) Pulse Rate:  [74-83] 74 (11/13 0839) Resp:  [17-18] 17 (11/13 0839) BP: (129-139)/(79-84) 129/79 (11/13 0839) SpO2:  [97 %-100 %] 99 % (11/13 0839) Weight:  [71.2 kg] 71.2 kg (11/13 0500) Last BM Date: 08/03/21  Intake/Output from previous day: 11/12 0701 - 11/13 0700 In: 1820.6 [P.O.:75; I.V.:1715.6; NG/GT:30] Out: 1920 [Urine:620; Emesis/NG output:1300] Intake/Output this shift: No intake/output data recorded.   General: pleasant, WD, male who is laying in bed in NAD HEENT: head is normocephalic, atraumatic.  Mouth is pink and moist Heart: regular, rate, and rhythm. Palpable radial pulses bilaterally Lungs: CTAB. Respiratory effort nonlabored Abd: soft, mild TTP greatest along midline incision without rebound or guarding. Honeycomb dressing over midline incision  removed with blood on distal portion, staples visible intact. Very mildly distended.    MSK: all 4 extremities are symmetrical with no cyanosis, clubbing, or edema. No calf TTP bilaterally Psych: A&Ox3 with an appropriate affect.  Lab Results:  No results for input(s): WBC, HGB, HCT, PLT in the last 72 hours. BMET Recent Labs    08/12/21 0321 08/13/21 0323  NA 136 135  K 3.7 4.1  CL 103 101  CO2 29 27  GLUCOSE 126* 96  BUN 13 14  CREATININE 0.69 0.63  CALCIUM 8.6* 8.6*   PT/INR No results for input(s): LABPROT, INR in the last 72 hours. ABG No results for input(s): PHART, HCO3 in the last 72 hours.  Invalid input(s): PCO2, PO2  Studies/Results: No results found.  Anti-infectives: Anti-infectives (From admission, onward)    Start     Dose/Rate Route Frequency Ordered Stop   08/09/21 1000  ceFAZolin (ANCEF) IVPB 2g/100 mL premix  Status:  Discontinued        2 g 200 mL/hr over 30  Minutes Intravenous To Short Stay 08/09/21 0758 08/10/21 1000   08/04/21 2200  oseltamivir (TAMIFLU) capsule 30 mg        30 mg Oral 2 times daily 08/04/21 0822 08/09/21 0959   08/04/21 1000  oseltamivir (TAMIFLU) capsule 75 mg        75 mg Oral  Once 08/04/21 0825 08/04/21 1116   08/03/21 1645  cefTRIAXone (ROCEPHIN) 2 g in sodium chloride 0.9 % 100 mL IVPB  Status:  Discontinued        2 g 200 mL/hr over 30 Minutes Intravenous Every 24 hours 08/03/21 1631 08/06/21 1519   08/03/21 1645  metroNIDAZOLE (FLAGYL) IVPB 500 mg  Status:  Discontinued        500 mg 100 mL/hr over 60 Minutes Intravenous Every 12 hours 08/03/21 1631 08/06/21 1519       Assessment/Plan: s/p Procedure(s): EXPLORATORY LAPAROTOMY , bowel obstruction (N/A)  SBO POD 4   s/p ex lap LOA by Dr. Janee Morn 11/9 with findings of dense adhesion near distal ileum down to the mesentery   - continue NPO, NGT  clamped x 8 hr - if no N/V ok to D/C  and AROBF. Monitor NG output - 400 ml out yesterday -CHLOROSEPTIC SPRAY prn - encouraged ambulation and incentive spirometry use - multimodal pain control - PICC/TNA    ID - ancef periop VTE - SCDs, lovenox FEN - NPO, NGT LIWS, IVF @ 75 ml/hr Foley - placed periop 11/9, d/c  11/10   AKI HTN HLD COPD CHF      LOS: 9 days    Clovis Pu Ailine Hefferan 08/13/2021

## 2021-08-13 NOTE — Assessment & Plan Note (Signed)
resolved 

## 2021-08-13 NOTE — Assessment & Plan Note (Signed)
--   Resolved.  Secondary to acute illness.

## 2021-08-13 NOTE — Assessment & Plan Note (Signed)
--   Resolved.  Treated with Tamiflu.

## 2021-08-13 NOTE — Assessment & Plan Note (Addendum)
--   Diet as per surgery, weaning TPN as able -- Ensure Enlive po TID, each supplement provides 350 kcal and 20 grams of protein

## 2021-08-13 NOTE — Assessment & Plan Note (Addendum)
--   Based on 2017 echo with LVEF 20-25%.  Appears stable.  No evidence of volume overload on exam.  Follow-up with cardiology as an outpatient.

## 2021-08-13 NOTE — Progress Notes (Signed)
  Progress Note [name]   RPR:[id number]  DOB: 1951-04-01  DOA: 08/03/2021     9 Date of Service: 08/13/2021   Clinical Course 70 year old man presenting with abdominal pain.  Admitted for suspected enteritis, ileus, seen by general surgery.  Failed to improve with conservative management and small bowel protocol, subsequent diagnosis small bowel obstruction, underwent exploratory laparotomy 11/9 which revealed small bowel entrapped by dense adhesions which were lysed.  Continues on TPN at this point.  Home when bowel function returned.  Assessment and Plan * SBO (small bowel obstruction) (HCC) -- Status post exploratory laparotomy 11/9, remains on TPN.  Management per surgery.  Appears comfortable.  Protein-calorie malnutrition, severe -- Continue TPN.  Chronic systolic CHF (congestive heart failure) (HCC) -- Based on 2017 echo with LVEF 20-25%.  Appears stable.  Follow-up with cardiology as an outpatient.  Aortic atherosclerosis (HCC) --continue statin on discharge  Leukopenia-resolved as of 08/13/2021 --resolved  Influenza A-resolved as of 08/13/2021 -- Resolved.  Treated with Tamiflu.  AKI (acute kidney injury) (HCC)-resolved as of 08/13/2021 -- Resolved.  Secondary to acute illness.  Subjective:  Feels ok  Objective Vitals:   08/12/21 2123 08/13/21 0435 08/13/21 0500 08/13/21 0839  BP: 131/80 132/81  129/79  Pulse: 83 74  74  Resp: 17 18  17   Temp: 98.9 F (37.2 C) 98.6 F (37 C)  98.5 F (36.9 C)  TempSrc: Oral Oral  Oral  SpO2: 97% 100%  99%  Weight:   71.2 kg   Height:       71.2 kg  Vital signs were reviewed and unremarkable.  Exam Physical Exam Vitals reviewed.  Constitutional:      General: He is not in acute distress. Cardiovascular:     Rate and Rhythm: Normal rate and regular rhythm.     Heart sounds: No murmur heard. Pulmonary:     Effort: Respiratory distress present.     Breath sounds: No wheezing, rhonchi or rales.  Neurological:      Mental Status: He is alert.  Psychiatric:        Mood and Affect: Mood normal.        Behavior: Behavior normal.    Labs / Other Information My review of labs, imaging, notes and other tests is significant for BMP, phosphorus and magnesium unremarkable    Disposition Plan: Status is: Inpatient  Remains inpatient appropriate because: Remains n.p.o. on TPN  Enoxaparin No family present   Time spent: 25 minutes [facility] 08/13/2021, 3:04 PM

## 2021-08-14 LAB — COMPREHENSIVE METABOLIC PANEL
ALT: 31 U/L (ref 0–44)
AST: 35 U/L (ref 15–41)
Albumin: 2.8 g/dL — ABNORMAL LOW (ref 3.5–5.0)
Alkaline Phosphatase: 31 U/L — ABNORMAL LOW (ref 38–126)
Anion gap: 6 (ref 5–15)
BUN: 16 mg/dL (ref 8–23)
CO2: 26 mmol/L (ref 22–32)
Calcium: 8.9 mg/dL (ref 8.9–10.3)
Chloride: 102 mmol/L (ref 98–111)
Creatinine, Ser: 0.58 mg/dL — ABNORMAL LOW (ref 0.61–1.24)
GFR, Estimated: >60 mL/min (ref >60)
Glucose, Bld: 131 mg/dL — ABNORMAL HIGH (ref 70–99)
Potassium: 4.1 mmol/L (ref 3.5–5.1)
Sodium: 134 mmol/L — ABNORMAL LOW (ref 135–145)
Total Bilirubin: 0.3 mg/dL (ref 0.3–1.2)
Total Protein: 6.1 g/dL — ABNORMAL LOW (ref 6.5–8.1)

## 2021-08-14 LAB — TRIGLYCERIDES: Triglycerides: 57 mg/dL (ref <150)

## 2021-08-14 LAB — GLUCOSE, CAPILLARY
Glucose-Capillary: 138 mg/dL — ABNORMAL HIGH (ref 70–99)
Glucose-Capillary: 139 mg/dL — ABNORMAL HIGH (ref 70–99)
Glucose-Capillary: 139 mg/dL — ABNORMAL HIGH (ref 70–99)
Glucose-Capillary: 148 mg/dL — ABNORMAL HIGH (ref 70–99)
Glucose-Capillary: 198 mg/dL — ABNORMAL HIGH (ref 70–99)

## 2021-08-14 LAB — PHOSPHORUS: Phosphorus: 3.8 mg/dL (ref 2.5–4.6)

## 2021-08-14 LAB — MAGNESIUM: Magnesium: 2.1 mg/dL (ref 1.7–2.4)

## 2021-08-14 MED ORDER — ALUM & MAG HYDROXIDE-SIMETH 200-200-20 MG/5ML PO SUSP
30.0000 mL | Freq: Four times a day (QID) | ORAL | Status: DC | PRN
  Administered 2021-08-14 – 2021-08-18 (×2): 30 mL via ORAL
  Filled 2021-08-14 (×2): qty 30

## 2021-08-14 MED ORDER — ENSURE ENLIVE PO LIQD
237.0000 mL | Freq: Three times a day (TID) | ORAL | Status: DC
  Administered 2021-08-14 – 2021-08-18 (×11): 237 mL via ORAL

## 2021-08-14 MED ORDER — TRAVASOL 10 % IV SOLN
INTRAVENOUS | Status: AC
  Filled 2021-08-14: qty 1166.4

## 2021-08-14 NOTE — Progress Notes (Signed)
PT Cancellation Note  Patient Details Name: [name] MRN: [id number] DOB: September 20, 1951   Cancelled Treatment:    Reason Eval/Treat Not Completed: Patient declined, no reason specified. Despite encouragement patient continues to decline. Will continue to follow.     Yong Grieser 08/14/2021, 1:31 PM

## 2021-08-14 NOTE — Progress Notes (Signed)
OT Cancellation Note  Patient Details Name: [name] MRN: [id number] DOB: 09-23-1951   Cancelled Treatment:    Reason Eval/Treat Not Completed: Patient declined, no reason specified. Pt reports feeling nauseous and requesting OT return tomorrow. Will follow up as able.  Hillard Danker, OT/L  Acute Rehab [phone]  Lenice Llamas 08/14/2021, 5:24 PM

## 2021-08-14 NOTE — Progress Notes (Signed)
PHARMACY - TOTAL PARENTERAL NUTRITION CONSULT NOTE   Indication: Small bowel obstruction  Patient Measurements: Height: 6\' 1"  (185.4 cm) Weight: 70.8 kg (156 lb 1.4 oz) IBW/kg (Calculated) : 79.9 TPN AdjBW (KG): 70 Body mass index is 20.59 kg/m. Usual Weight: 74 kg  Assessment:  70 y.o. male with PMH significant for hypertension, CHF with LVEF ~25%, GERD, BPH, prior history of alcoholism who presented with complaints of lower abdominal pain found to have SBO.   Per RD's conversation with patient, reported he was eating well prior to admission. States he would have abdominal pain when eating. Pt reports typical intake of breakfast: eggs, Lunch: sandwich, Dinner: fried chicken, rice, and broccoli.  Patient has remained NPO/CLD, now for more than 7 days and at risk of becoming severely malnourished. Pharmacy is consulted to initiate TPN.  Glucose / Insulin: no hx of DM, CBGs controlled - utilized 4 units SSI in last 24h Noted Decadron given x 1 on 11/9 Electrolytes: Na 134 and trending down, all others WNL Renal: Scr stable WNL, BUN WNL Hepatic: LFTs / Tbili / TG WNL, albumin 2.8 Intake / Output; MIVF: UOP 1150 mL, NG out 11/13, LBM 11/3, +flatus reported 11/13 GI Imaging: 11/9 CToA: Persistent small bowel obstruction GI Surgeries / Procedures:  11/9 exploratory laparotomy with lysis of adhesions    Central access: 11/10 PICC  TPN start date:  11/11  Nutritional Goals: Goal TPN rate is 90 mL/hr (provides 117 g of protein and 2334 kcals per day)  RD Assessment: Estimated Needs Total Energy Estimated Needs: 2300-2500 Total Protein Estimated Needs: 115-130 grams Total Fluid Estimated Needs: > 2 L  Current Nutrition:  Clear liquids 11/14; TPN  Plan:  Continue TPN at goal rate 90 ml/hr at 1800 Electrolytes in TPN: increase Na 4mEq/L, K 10mEq/L, Ca 57mEq/L, Mg 4mEq/L, and Phos 23mmol/L. Cl:Ac 1:1 Add standard MVI and trace elements to TPN Discontinue SSI with minimal  usage Monitor TPN labs on Mon/Thurs F/u Surgery plans - CLD starting today  Thank you for involving pharmacy in this patient's care.  12m, PharmD, BCPS Clinical Pharmacist Clinical phone for 08/14/2021 until 3p is [phone] 08/14/2021 7:21 AM  **Pharmacist phone directory can be found on [email or web address] listed under [facility]**

## 2021-08-14 NOTE — Progress Notes (Signed)
Progress Note  5 Days Post-Op  Subjective: In good spirits and feeling better with NGT out. No flatus so far today but tolerating clears. He states he had a BM this am (not charted). Abdominal pain is stable. No nausea or emesis  Aunt is bedside  Objective: Vital signs in last 24 hours: Temp:  [98.2 F (36.8 C)-99.2 F (37.3 C)] 98.2 F (36.8 C) (11/14 0452) Pulse Rate:  [74-82] 82 (11/14 0452) Resp:  [16-18] 18 (11/14 0452) BP: (129-142)/(79-85) 142/83 (11/14 0452) SpO2:  [98 %-99 %] 99 % (11/14 0452) Weight:  [70.8 kg] 70.8 kg (11/14 0500) Last BM Date: 08/03/21  Intake/Output from previous day: 11/13 0701 - 11/14 0700 In: 2506.8 [P.O.:360; I.V.:2026.8; NG/GT:120] Out: 1250 [Urine:1150] Intake/Output this shift: No intake/output data recorded.  PE: General: pleasant, WD, male who is sitting up in chair in NAD HEENT: head is normocephalic, atraumatic.  Mouth is pink and moist Heart: regular, rate, and rhythm. Palpable radial pulses bilaterally Lungs: CTAB. Respiratory effort nonlabored Abd: soft, very mild TTP greatest along midline incision without rebound or guarding. Staples c/d/i. Very mildly distended.  MSK: all 4 extremities are symmetrical with no cyanosis, clubbing, or edema. No calf TTP bilaterally Skin: warm and dry with no masses, lesions, or rashes Psych: A&Ox3 with an appropriate affect.    Lab Results:  No results for input(s): WBC, HGB, HCT, PLT in the last 72 hours. BMET Recent Labs    08/13/21 0323 08/14/21 0336  NA 135 134*  K 4.1 4.1  CL 101 102  CO2 27 26  GLUCOSE 96 131*  BUN 14 16  CREATININE 0.63 0.58*  CALCIUM 8.6* 8.9   PT/INR No results for input(s): LABPROT, INR in the last 72 hours. CMP     Component Value Date/Time   NA 134 (L) 08/14/2021 0336   NA 141 11/15/2017 1054   K 4.1 08/14/2021 0336   CL 102 08/14/2021 0336   CO2 26 08/14/2021 0336   GLUCOSE 131 (H) 08/14/2021 0336   BUN 16 08/14/2021 0336   BUN 13  11/15/2017 1054   CREATININE 0.58 (L) 08/14/2021 0336   CREATININE 0.84 09/28/2020 0956   CALCIUM 8.9 08/14/2021 0336   PROT 6.1 (L) 08/14/2021 0336   PROT 6.5 09/22/2015 0949   ALBUMIN 2.8 (L) 08/14/2021 0336   ALBUMIN 3.9 09/22/2015 0949   AST 35 08/14/2021 0336   ALT 31 08/14/2021 0336   ALKPHOS 31 (L) 08/14/2021 0336   BILITOT 0.3 08/14/2021 0336   BILITOT 0.8 09/22/2015 0949   GFRNONAA >60 08/14/2021 0336   GFRNONAA 90 09/28/2020 0956   GFRAA 104 09/28/2020 0956   Lipase     Component Value Date/Time   LIPASE 24 08/03/2021 1045       Studies/Results: No results found.  Anti-infectives: Anti-infectives (From admission, onward)    Start     Dose/Rate Route Frequency Ordered Stop   08/09/21 1000  ceFAZolin (ANCEF) IVPB 2g/100 mL premix  Status:  Discontinued        2 g 200 mL/hr over 30 Minutes Intravenous To Short Stay 08/09/21 0758 08/10/21 1000   08/04/21 2200  oseltamivir (TAMIFLU) capsule 30 mg        30 mg Oral 2 times daily 08/04/21 0822 08/09/21 0959   08/04/21 1000  oseltamivir (TAMIFLU) capsule 75 mg        75 mg Oral  Once 08/04/21 0825 08/04/21 1116   08/03/21 1645  cefTRIAXone (ROCEPHIN) 2 g in sodium chloride  0.9 % 100 mL IVPB  Status:  Discontinued        2 g 200 mL/hr over 30 Minutes Intravenous Every 24 hours 08/03/21 1631 08/06/21 1519   08/03/21 1645  metroNIDAZOLE (FLAGYL) IVPB 500 mg  Status:  Discontinued        500 mg 100 mL/hr over 60 Minutes Intravenous Every 12 hours 08/03/21 1631 08/06/21 1519        Assessment/Plan  SBO POD 5 s/p ex lap LOA by Dr. Janee Morn 11/9 with findings of dense adhesion near distal ileum down to the mesentery   - NGT out yesterday 11/13 - continue full liquids today - encouraged ambulation and incentive spirometry use - multimodal pain control - PICC/TNA - monitor PO intake   FEN - FLD, IVF ID - ancef periop VTE - SCDs, lovenox Foley - placed periop 11/9, d/c 11/10   AKI HTN HLD COPD CHF       LOS: 10 days    Eric Form, [facility] 08/14/2021, 7:48 AM Please see Amion for pager number during day hours 7:00am-4:30pm

## 2021-08-14 NOTE — Progress Notes (Signed)
  Progress Note [id number]  DOB: [birth year]  DOA: 08/03/2021     10 Date of Service: 08/14/2021   Clinical Course 70 year old man presenting with abdominal pain.  Admitted for suspected enteritis, ileus, seen by general surgery.  Failed to improve with conservative management and small bowel protocol, subsequent diagnosis small bowel obstruction, underwent exploratory laparotomy 11/9 which revealed small bowel entrapped by dense adhesions which were lysed.  Started on TPN.  Bowel function returning 11/14, diet advanced, NG tube out.  Weaning TPN.  Hopefully home 2 to 4 days.  Assessment and Plan * SBO (small bowel obstruction) (HCC) -- Status post exploratory laparotomy 11/9, remains on TPN but NG tube out now, TPN being weaned, diet advanced per surgery.  Protein-calorie malnutrition, severe -- Now on diet per surgery, weaning TPN  Chronic systolic CHF (congestive heart failure) (HCC) -- Based on 2017 echo with LVEF 20-25%.  Appears stable.  Follow-up with cardiology as an outpatient.  Aortic atherosclerosis (HCC) --continue statin on discharge  Leukopenia-resolved as of 08/13/2021 --resolved  Influenza A-resolved as of 08/13/2021 -- Resolved.  Treated with Tamiflu.  AKI (acute kidney injury) (HCC)-resolved as of 08/13/2021 -- Resolved.  Secondary to acute illness.   Subjective:  Feels ok  Objective Vitals:   08/13/21 2000 08/14/21 0452 08/14/21 0500 08/14/21 1017  BP: 131/85 (!) 142/83  (!) 135/91  Pulse: 80 82  83  Resp: 16 18  18   Temp: 99.2 F (37.3 C) 98.2 F (36.8 C)  97.9 F (36.6 C)  TempSrc: Oral Oral  Oral  SpO2: 98% 99%  99%  Weight:   70.8 kg   Height:       70.8 kg  Vital signs were reviewed and unremarkable.  Exam Physical Exam Vitals reviewed.  Constitutional:      General: He is not in acute distress.    Appearance: He is not ill-appearing or toxic-appearing.  Cardiovascular:     Rate and Rhythm: Normal rate and regular rhythm.     Heart  sounds: No murmur heard. Pulmonary:     Effort: Pulmonary effort is normal.     Breath sounds: No wheezing, rhonchi or rales.  Neurological:     Mental Status: He is alert.  Psychiatric:        Mood and Affect: Mood normal.        Behavior: Behavior normal.    Labs / Other Information My review of labs, imaging, notes and other tests is significant for stable CMP    Disposition Plan: Status is: Inpatient  Remains inpatient appropriate because: on TPN, bowel function returning, s/p SBO ex lap  Enoxaparin No family present  Time spent: 20 minutes [facility] 08/14/2021, 3:01 PM

## 2021-08-14 NOTE — TOC Initial Note (Signed)
Transition of Care [facility]) - Initial/Assessment Note    Patient Details  Name: [name] MRN: [id number] Date of Birth: 03-21-1951  [facility]) CM/SW Contact:    Kingsley Plan, RN Phone Number: 08/14/2021, 12:40 PM  Clinical Narrative:                 Spoke to patient at bedside.   Discussed HHPT/RN patient in agreement. Patient consent for NCM to call his aunt [name] [phone]. NCM called Ms Laural Benes in agreement for [facility] and walker.   Patient lives at [facility] [facility], [address] [place], [place] [address]  [facility] arranged with Chip Boer and ordered walker with [employer]    Expected Discharge Plan: Home w Home Health Services Barriers to Discharge: Continued Medical Work up   Patient Goals and CMS Choice Patient states their goals for this hospitalization and ongoing recovery are:: to return to home CMS Medicare.gov Compare Post Acute Care list provided to:: Patient Choice offered to / list presented to : Patient  Expected Discharge Plan and Services Expected Discharge Plan: Home w Home Health Services   Discharge Planning Services: CM Consult Post Acute Care Choice: Home Health, Durable Medical Equipment Living arrangements for the past 2 months: Hotel/Motel                 DME Arranged: Walker rolling DME Agency: [employer] Date DME Agency Contacted: 08/14/21 Time DME Agency Contacted: 1239 Representative spoke with at DME Agency: Velna Hatchet HH Arranged: RN, PT HH Agency: [facility] Date [facility] Agency Contacted: 08/14/21 Time HH Agency Contacted: 1239 Representative spoke with at [facility] Agency: Marylene Land  Prior Living Arrangements/Services Living arrangements for the past 2 months: Hotel/Motel Lives with:: Self Patient language and need for interpreter reviewed:: Yes Do you feel safe going back to the place where you live?: Yes      Need for Family Participation in Patient Care: Yes (Comment) Care giver support system in  place?: Yes (comment)   Criminal Activity/Legal Involvement Pertinent to Current Situation/Hospitalization: No - Comment as needed  Activities of Daily Living Home Assistive Devices/Equipment: Other (Comment) (cane, walker) ADL Screening (condition at time of admission) Patient's cognitive ability adequate to safely complete daily activities?: Yes Is the patient deaf or have difficulty hearing?: No Does the patient have difficulty seeing, even when wearing glasses/contacts?: No Does the patient have difficulty concentrating, remembering, or making decisions?: No Patient able to express need for assistance with ADLs?: Yes Does the patient have difficulty dressing or bathing?: No Independently performs ADLs?: Yes (appropriate for developmental age) Does the patient have difficulty walking or climbing stairs?: No Weakness of Legs: None Weakness of Arms/Hands: None  Permission Sought/Granted   Permission granted to share information with : Yes, Verbal Permission Granted  Share Information with NAME: Ernestene Kiel [phone] aunt           Emotional Assessment Appearance:: Appears stated age Attitude/Demeanor/Rapport: Engaged Affect (typically observed): Accepting Orientation: : Oriented to Self, Oriented to Place, Oriented to  Time, Oriented to Situation Alcohol / Substance Use: Not Applicable Psych Involvement: No (comment)  Admission diagnosis:  Enteritis [K52.9] Small bowel obstruction (HCC) [K56.609] Lower abdominal pain [R10.30] AKI (acute kidney injury) (HCC) [N17.9] Patient Active Problem List   Diagnosis Date Noted   SBO (small bowel obstruction) (HCC) 08/12/2021   Aortic atherosclerosis (HCC) 08/12/2021   Protein-calorie malnutrition, severe 08/10/2021   Enteritis 08/03/2021   BPH with obstruction/lower  urinary tract symptoms 05/16/2021   Wrist pain, acute, left 03/21/2021   Lumbar radiculopathy, right 01/03/2018   Right hip pain 12/03/2017   Benign  prostatic hyperplasia with weak urinary stream 06/07/2017   Essential hypertension 04/26/2017   Seasonal allergies 05/25/2016   Severe major depression, single episode, without psychotic features (HCC) 03/04/2016   Alcohol use disorder, severe, dependence (HCC) 03/04/2016   Cocaine use disorder, moderate, dependence (HCC) 03/04/2016   NICM (nonischemic cardiomyopathy) (HCC) 01/23/2016   Chronic systolic CHF (congestive heart failure) (HCC) 01/23/2016   Epigastric abdominal pain 08/20/2011   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 [place] [address] Phone: [phone] Fax: [phone]     Social Determinants of Health (SDOH) Interventions    Readmission Risk Interventions No flowsheet data found.

## 2021-08-14 NOTE — Care Management Important Message (Signed)
Important Message  Patient Details  Name: [name] MRN: [id number] Date of Birth: January 15, 1951   Medicare Important Message Given:  Yes     Dorena Bodo 08/14/2021, 4:36 PM

## 2021-08-14 NOTE — Plan of Care (Signed)
  Problem: Activity: Goal: Risk for activity intolerance will decrease Outcome: Progressing   Problem: Nutrition: Goal: Adequate nutrition will be maintained Outcome: Progressing   Problem: Pain Managment: Goal: General experience of comfort will improve Outcome: Progressing   

## 2021-08-14 NOTE — Progress Notes (Signed)
Nutrition Follow-up  DOCUMENTATION CODES:   Severe malnutrition in context of chronic illness  INTERVENTION:   - Ensure Enlive po TID, each supplement provides 350 kcal and 20 grams of protein  - Encourage PO intake  NUTRITION DIAGNOSIS:   Severe Malnutrition related to chronic illness (CHF) as evidenced by severe muscle depletion, severe fat depletion.  Ongoing, being addressed via diet advancement and TPN  GOAL:   Patient will meet greater than or equal to 90% of their needs  Met via TPN  MONITOR:   Diet advancement, Labs, Skin, I & O's  REASON FOR ASSESSMENT:   NPO/Clear Liquid Diet    ASSESSMENT:   70 y.o. male presented to the ED with lower abdominal pain. PMH includes CHF, GERD, and HTN. Pt admitted with enteritis and an ileus; Flu+ on admission.  11/03 - NPO, CLD 11/04 - NPO 11/06 - NG tube placed for LIS 11/09 - s/p ex-lap with lysis of adhesions 11/11 - TPN start 11/12 - TPN increased to goal rate 11/13 - NG tube clamped, later removed 11/14 - clear liquids, full liquids  TPN currently infusing at 90 ml/hr to provide 2334 kcal and 117 grams of protein daily. Diet advanced to full liquids today.  Spoke with pt at bedside. Noted ~25% completed full liquid lunch meal tray at bedside. Pt reports that he consumed the soup but did not like the jello or pudding. He is drinking some of the tea. RD discussed importance of adequate PO intake in being able to wean from TPN. Pt willing to try strawberry Ensure. RD to order.  Admit weight: 81.6 kg Current weight: 70.8 kg  Medications reviewed and include: TPN  Labs reviewed: sodium 134 CBG's: 138-144 x 24 hours  UOP: 1150 ml x 24 hours I/O's: +6.7 L since admit  Diet Order:   Diet Order             Diet full liquid Room service appropriate? Yes with Assist; Fluid consistency: Thin  Diet effective now                   EDUCATION NEEDS:   No education needs have been identified at this  time  Skin:  Skin Assessment: Skin Integrity Issues: Incisions: Abdomen  Last BM:  08/14/21 medium type 7  Height:   Ht Readings from Last 1 Encounters:  08/11/21 '6\' 1"'  (1.854 m)    Weight:   Wt Readings from Last 1 Encounters:  08/14/21 70.8 kg    Ideal Body Weight:  83.6 kg  BMI:  Body mass index is 20.59 kg/m.  Estimated Nutritional Needs:   Kcal:  2300-2500  Protein:  115-130 grams  Fluid:  > 2 L    Gustavus Bryant, MS, RD, LDN Inpatient Clinical Dietitian Please see AMiON for contact information.

## 2021-08-15 ENCOUNTER — Other Ambulatory Visit: Payer: Self-pay | Admitting: *Deleted

## 2021-08-15 LAB — GLUCOSE, CAPILLARY
Glucose-Capillary: 147 mg/dL — ABNORMAL HIGH (ref 70–99)
Glucose-Capillary: 153 mg/dL — ABNORMAL HIGH (ref 70–99)
Glucose-Capillary: 170 mg/dL — ABNORMAL HIGH (ref 70–99)

## 2021-08-15 MED ORDER — TRAVASOL 10 % IV SOLN
INTRAVENOUS | Status: AC
  Filled 2021-08-15: qty 1183.2

## 2021-08-15 NOTE — Progress Notes (Signed)
  Progress Note [name]   ZOX:[id number]  DOB: July 03, 1951  DOA: 08/03/2021     11 Date of Service: 08/15/2021   Clinical Course 70 year old man presenting with abdominal pain.  Admitted for suspected enteritis, ileus, seen by general surgery.  Failed to improve with conservative management and small bowel protocol, subsequent diagnosis small bowel obstruction, underwent exploratory laparotomy 11/9 which revealed small bowel entrapped by dense adhesions which were lysed.  Started on TPN.  Bowel function returning 11/14, diet advanced, NG tube out.  Weaning TPN.  Hopefully home 2 to 4 days.  Assessment and Plan * SBO (small bowel obstruction) (HCC) -- Status post exploratory laparotomy 11/9, remains on TPN, diet but nauseated this morning with a little bit of emesis and increased distention and pain.  Continue management per general surgery.  Protein-calorie malnutrition, severe -- Diet as per surgery, weaning TPN as able -- Ensure Enlive po TID, each supplement provides 350 kcal and 20 grams of protein  Chronic systolic CHF (congestive heart failure) (HCC) -- Based on 2017 echo with LVEF 20-25%.  Appears stable.  No evidence of volume overload on exam.  Follow-up with cardiology as an outpatient.  Aortic atherosclerosis (HCC) --continue statin on discharge  Leukopenia-resolved as of 08/13/2021 --resolved  Influenza A-resolved as of 08/13/2021 -- Resolved.  Treated with Tamiflu.  AKI (acute kidney injury) (HCC)-resolved as of 08/13/2021 -- Resolved.  Secondary to acute illness.  Subjective:  Rough morning with some emesis, some bloating.  Objective Vitals:   08/14/21 1750 08/14/21 2120 08/15/21 0424 08/15/21 0820  BP: 128/87 (!) 142/94 129/83 (!) 140/92  Pulse: 100 99 99 98  Resp: 18 17 19 20   Temp: 99 F (37.2 C) 98.8 F (37.1 C) 98.6 F (37 C) 98.2 F (36.8 C)  TempSrc: Oral  Oral Oral  SpO2: 99% 99% 99% 100%  Weight:   68.1 kg   Height:       68.1 kg  Vital  signs were reviewed and unremarkable.  Exam Physical Exam Constitutional:      General: He is not in acute distress.    Appearance: He is not ill-appearing.  Cardiovascular:     Rate and Rhythm: Normal rate.     Heart sounds: No murmur heard. Pulmonary:     Effort: Pulmonary effort is normal. No respiratory distress.     Breath sounds: No wheezing, rhonchi or rales.  Abdominal:     General: There is distension.     Palpations: Abdomen is soft.     Tenderness: There is abdominal tenderness.  Neurological:     Mental Status: He is alert.  Psychiatric:        Mood and Affect: Mood normal.        Behavior: Behavior normal.    Labs / Other Information My review of labs, imaging, notes and other tests is significant for    CBG stable  Disposition Plan: Status is: Inpatient  Remains inpatient appropriate because: Slowly improving from small bowel obstruction, exploratory laparotomy  Enoxaparin  No family present  Time spent: 20 minutes [facility] 08/15/2021, 2:46 PM

## 2021-08-15 NOTE — Progress Notes (Signed)
Mobility Specialist Progress Note:   08/15/21 1010  Mobility  Activity Ambulated in room;Ambulated to bathroom  Level of Assistance Contact guard assist, steadying assist  Assistive Device Front wheel walker  Distance Ambulated (ft) 40 ft  Mobility Out of bed for toileting;Ambulated with assistance in room  Mobility Response Tolerated well  Mobility performed by Mobility specialist  $Mobility charge 1 Mobility   Pt asx during ambulation. Required encouragement to ambulate today, ultimately agreed to stay in room. Required contactG d/t generalized weakness.  Addison Lank Mobility Specialist  Phone [phone]

## 2021-08-15 NOTE — Progress Notes (Signed)
Progress Note  6 Days Post-Op  Subjective: Some nausea yesterday that resolved after BM. No emesis. Did not work with therapies yesterday. Pain is well controlled.  Objective: Vital signs in last 24 hours: Temp:  [97.9 F (36.6 C)-99 F (37.2 C)] 98.6 F (37 C) (11/15 0424) Pulse Rate:  [83-100] 99 (11/15 0424) Resp:  [17-19] 19 (11/15 0424) BP: (128-142)/(83-94) 129/83 (11/15 0424) SpO2:  [99 %] 99 % (11/15 0424) Weight:  [68.1 kg] 68.1 kg (11/15 0424) Last BM Date: 08/14/21  Intake/Output from previous day: 11/14 0701 - 11/15 0700 In: 1265.9 [P.O.:360; I.V.:905.9] Out: 420 [Urine:220; Emesis/NG output:200] Intake/Output this shift: No intake/output data recorded.  PE: General: pleasant, WD, male who is laying in bed in NAD HEENT: head is normocephalic, atraumatic.  Mouth is pink and moist Heart: regular, rate, and rhythm. Palpable radial pulses bilaterally Lungs: CTAB. Respiratory effort nonlabored Abd: soft, very mild TTP greatest along midline incision without rebound or guarding. Staples c/d/i. Mild distension.  MSK: all 4 extremities are symmetrical with no cyanosis, clubbing, or edema. No calf TTP bilaterally Skin: warm and dry with no masses, lesions, or rashes Psych: A&Ox3 with an appropriate affect.    Lab Results:  No results for input(s): WBC, HGB, HCT, PLT in the last 72 hours. BMET Recent Labs    08/13/21 0323 08/14/21 0336  NA 135 134*  K 4.1 4.1  CL 101 102  CO2 27 26  GLUCOSE 96 131*  BUN 14 16  CREATININE 0.63 0.58*  CALCIUM 8.6* 8.9    PT/INR No results for input(s): LABPROT, INR in the last 72 hours. CMP     Component Value Date/Time   NA 134 (L) 08/14/2021 0336   NA 141 11/15/2017 1054   K 4.1 08/14/2021 0336   CL 102 08/14/2021 0336   CO2 26 08/14/2021 0336   GLUCOSE 131 (H) 08/14/2021 0336   BUN 16 08/14/2021 0336   BUN 13 11/15/2017 1054   CREATININE 0.58 (L) 08/14/2021 0336   CREATININE 0.84 09/28/2020 0956   CALCIUM  8.9 08/14/2021 0336   PROT 6.1 (L) 08/14/2021 0336   PROT 6.5 09/22/2015 0949   ALBUMIN 2.8 (L) 08/14/2021 0336   ALBUMIN 3.9 09/22/2015 0949   AST 35 08/14/2021 0336   ALT 31 08/14/2021 0336   ALKPHOS 31 (L) 08/14/2021 0336   BILITOT 0.3 08/14/2021 0336   BILITOT 0.8 09/22/2015 0949   GFRNONAA >60 08/14/2021 0336   GFRNONAA 90 09/28/2020 0956   GFRAA 104 09/28/2020 0956   Lipase     Component Value Date/Time   LIPASE 24 08/03/2021 1045       Studies/Results: No results found.  Anti-infectives: Anti-infectives (From admission, onward)    Start     Dose/Rate Route Frequency Ordered Stop   08/09/21 1000  ceFAZolin (ANCEF) IVPB 2g/100 mL premix  Status:  Discontinued        2 g 200 mL/hr over 30 Minutes Intravenous To Short Stay 08/09/21 0758 08/10/21 1000   08/04/21 2200  oseltamivir (TAMIFLU) capsule 30 mg        30 mg Oral 2 times daily 08/04/21 0822 08/09/21 0959   08/04/21 1000  oseltamivir (TAMIFLU) capsule 75 mg        75 mg Oral  Once 08/04/21 0825 08/04/21 1116   08/03/21 1645  cefTRIAXone (ROCEPHIN) 2 g in sodium chloride 0.9 % 100 mL IVPB  Status:  Discontinued        2 g 200 mL/hr over  30 Minutes Intravenous Every 24 hours 08/03/21 1631 08/06/21 1519   08/03/21 1645  metroNIDAZOLE (FLAGYL) IVPB 500 mg  Status:  Discontinued        500 mg 100 mL/hr over 60 Minutes Intravenous Every 12 hours 08/03/21 1631 08/06/21 1519        Assessment/Plan  SBO POD 6 s/p ex lap LOA by Dr. Janee Morn 11/9 with findings of dense adhesion near distal ileum down to the mesentery   - NGT out 11/13 - tolerated full liquid diet and having BM. Advance to soft and wean TPN. Back to liquids if emesis develops - encouraged ambulation and incentive spirometry use - encouraged working with therapies today - multimodal pain control - PICC/TNA - monitor PO intake   FEN - soft, IVF ID - ancef periop VTE - SCDs, lovenox Foley - placed periop 11/9, d/c 11/10    AKI HTN HLD COPD CHF      LOS: 11 days    Eric Form, [facility] Surgery 08/15/2021, 7:39 AM Please see Amion for pager number during day hours 7:00am-4:30pm

## 2021-08-15 NOTE — Patient Outreach (Signed)
[facility]) Care Management  08/15/2021  MACLEAN FOISTER [birth year] [phone]  Patient was admitted to the hospital on 08/03/21. Nurse Health Coach will perform case closure and transfer patient to the [facility] CM Team. [facility] Liaison Coordinator informed nurse of patient's admission.    Plan: RN Health Coach Discipline Closure  [name] RN, Mining engineer [facility] [phone] [email or web address]

## 2021-08-15 NOTE — Progress Notes (Signed)
PHARMACY - TOTAL PARENTERAL NUTRITION CONSULT NOTE   Indication: Small bowel obstruction  Patient Measurements: Height: 6\' 1"  (185.4 cm) Weight: 68.1 kg (150 lb 2.1 oz) IBW/kg (Calculated) : 79.9 TPN AdjBW (KG): 70 Body mass index is 19.81 kg/m. Usual Weight: 74 kg  Assessment:  70 y.o. male with PMH significant for hypertension, CHF with LVEF ~25%, GERD, BPH, prior history of alcoholism who presented with complaints of lower abdominal pain found to have SBO.   Per RD's conversation with patient, reported he was eating well prior to admission. States he would have abdominal pain when eating. Pt reports typical intake of breakfast: eggs, Lunch: sandwich, Dinner: fried chicken, rice, and broccoli. Per patient 11/15, he sometimes skips meals at home even though his wife is a good cook because he just doesn't feel like eating.  Patient has remained NPO/CLD, now for more than 7 days and at risk of becoming severely malnourished. Pharmacy is consulted to initiate TPN.  Glucose / Insulin: no hx of DM, BG < 180, off insulin  Electrolytes: last labs 11/14: Na 134 and trending down, CoCa 9.8, others WNL Renal: Scr 0.5 stable, BUN WNL Hepatic: LFTs / Tbili / TG WNL, albumin 2.8 Intake / Output; MIVF: UOP not well documented, emesis 12/14, small BM 11/15  GI Imaging: 11/9 CToA: Persistent small bowel obstruction GI Surgeries / Procedures:  11/9 exploratory laparotomy with lysis of adhesions    Central access: 11/10 PICC  TPN start date:  11/11  Nutritional Goals: Goal continuous TPN rate is 90 mL/hr (provides 118 g of protein and 2334 kcals per day)  RD Assessment: Estimated Needs Total Energy Estimated Needs: 2300-2500 Total Protein Estimated Needs: 115-130 grams Total Fluid Estimated Needs: > 2 L  Current Nutrition:  TPN 11/14 CLD - drank sips of Ensure and soup 11/15 CLD - will work on drinking more. Note patient does not have his dentures, which are at home  Plan:  Cycle TPN  over 18 hours (rate 60-120 ml/hr) - provides 118g AA and 2056 kCal  Electrolytes in TPN: Increase Na 167mEq/L, Mg 53mEq/L; Decrease Phos 12 mmol/L; Continue K 51mEq/L, Ca 43mEq/L. Cl:Ac 1:1 Add standard MVI and trace elements to TPN  Monitor TPN labs daily while cycling and on Mon/Thurs  F/u PO intake, wean TPN as able   Thank you for involving pharmacy in this patient's care.  4m, PharmD, BCPS, BCCP Clinical Pharmacist  Please check AMION for all [facility] phone numbers After 10:00 PM, call Main Pharmacy [phone]

## 2021-08-15 NOTE — Progress Notes (Signed)
Occupational Therapy Treatment Patient Details Name: [name] MRN: [id number] DOB: 1951-03-06 Today's Date: 08/15/2021   History of present illness Patient is a 70 y/o male who presents on 08/03/21 due to abdominal pain and diarrhea. CT abdomen/pelvis- dilated fluid and air-filled small intestine consistent with small bowel obstruction. Per surgery, ilieus vs enteritis vs partial SBO. Exploratory lap 11/9.   PMH includes HTN, chronic systolic CHF- EF 34%, prior history of alcoholism   OT comments  Pt is slowly progressing towards OT goals. Remains limited by pain and balance. During session, pt completed toileting, toilet transfer, bed mobility, and grooming at sink with supervision. Pt reports that his abdominal pain is improving and he felt less nauseous today. Will continue to follow.   Recommendations for follow up therapy are one component of a multi-disciplinary discharge planning process, led by the attending physician.  Recommendations may be updated based on patient status, additional functional criteria and insurance authorization.    Follow Up Recommendations  No OT follow up    Assistance Recommended at Discharge Intermittent Supervision/Assistance  Equipment Recommendations  None recommended by OT    Recommendations for Other Services      Precautions / Restrictions Precautions Precautions: Fall Restrictions Weight Bearing Restrictions: No       Mobility Bed Mobility Overal bed mobility: Needs Assistance Bed Mobility: Rolling;Sidelying to Sit;Sit to Sidelying Rolling: Supervision Sidelying to sit: Supervision     Sit to sidelying: Supervision      Transfers Overall transfer level: Needs assistance Equipment used: Rolling walker (2 wheels) Transfers: Sit to/from Stand Sit to Stand: Supervision                 Balance Overall balance assessment: Needs assistance;History of Falls Sitting-balance support: Feet supported Sitting balance-Leahy  Scale: Good     Standing balance support: Bilateral upper extremity supported Standing balance-Leahy Scale: Fair                             ADL either performed or assessed with clinical judgement   ADL Overall ADL's : Needs assistance/impaired     Grooming: Supervision/safety;Wash/dry hands;Wash/dry face;Standing Grooming Details (indicate cue type and reason): At sink                 Toilet Transfer: Supervision/safety;Ambulation;Regular Toilet;Rolling walker (2 wheels)   Toileting- Clothing Manipulation and Hygiene: Independent;Sitting/lateral lean       Functional mobility during ADLs: Supervision/safety;Rolling walker (2 wheels) General ADL Comments: Required encouragement to participate    Extremity/Trunk Assessment              Vision       Perception     Praxis      Cognition Arousal/Alertness: Awake/alert Behavior During Therapy: WFL for tasks assessed/performed Overall Cognitive Status: Within Functional Limits for tasks assessed                                            Exercises     Shoulder Instructions       General Comments      Pertinent Vitals/ Pain       Pain Assessment: Faces Faces Pain Scale: Hurts little more Pain Location: abdomen Pain Descriptors / Indicators: Sore;Aching Pain Intervention(s): Monitored during session;Repositioned  Home Living  Prior Functioning/Environment              Frequency  Min 2X/week        Progress Toward Goals  OT Goals(current goals can now be found in the care plan section)  Progress towards OT goals: Progressing toward goals  Acute Rehab OT Goals Patient Stated Goal: return home OT Goal Formulation: With patient Time For Goal Achievement: 08/19/21 Potential to Achieve Goals: Good ADL Goals Pt Will Perform Grooming: with modified independence;standing Pt Will Perform Lower Body  Bathing: with modified independence Pt Will Perform Lower Body Dressing: with modified independence;sit to/from stand Pt Will Transfer to Toilet: with modified independence;ambulating;regular height toilet Pt Will Perform Tub/Shower Transfer: Tub transfer;with modified independence;ambulating;3 in 1  Plan Discharge plan remains appropriate;Frequency remains appropriate    Co-evaluation                 AM-PAC OT "6 Clicks" Daily Activity     Outcome Measure   Help from another person eating meals?: None Help from another person taking care of personal grooming?: A Little Help from another person toileting, which includes using toliet, bedpan, or urinal?: A Little Help from another person bathing (including washing, rinsing, drying)?: A Little Help from another person to put on and taking off regular upper body clothing?: None Help from another person to put on and taking off regular lower body clothing?: A Little 6 Click Score: 20    End of Session Equipment Utilized During Treatment: Rolling walker (2 wheels)  OT Visit Diagnosis: Unsteadiness on feet (R26.81);History of falling (Z91.81);Pain   Activity Tolerance Patient tolerated treatment well   Patient Left in bed;with call bell/phone within reach   Nurse Communication Mobility status        Time: 8676-1950 OT Time Calculation (min): 18 min  Charges: OT General Charges $OT Visit: 1 Visit OT Treatments $Self Care/Home Management : 8-22 mins  [name], OT/L  Acute Rehab [phone]  [name] 08/15/2021, 4:26 PM

## 2021-08-16 ENCOUNTER — Inpatient Hospital Stay (HOSPITAL_COMMUNITY): Payer: Medicare HMO

## 2021-08-16 DIAGNOSIS — I5022 Chronic systolic (congestive) heart failure: Secondary | ICD-10-CM | POA: Diagnosis not present

## 2021-08-16 DIAGNOSIS — K567 Ileus, unspecified: Secondary | ICD-10-CM

## 2021-08-16 DIAGNOSIS — N179 Acute kidney failure, unspecified: Secondary | ICD-10-CM | POA: Diagnosis not present

## 2021-08-16 DIAGNOSIS — R112 Nausea with vomiting, unspecified: Secondary | ICD-10-CM

## 2021-08-16 DIAGNOSIS — K9189 Other postprocedural complications and disorders of digestive system: Secondary | ICD-10-CM

## 2021-08-16 DIAGNOSIS — K56609 Unspecified intestinal obstruction, unspecified as to partial versus complete obstruction: Secondary | ICD-10-CM | POA: Diagnosis not present

## 2021-08-16 LAB — MAGNESIUM: Magnesium: 2.3 mg/dL (ref 1.7–2.4)

## 2021-08-16 LAB — GLUCOSE, CAPILLARY
Glucose-Capillary: 103 mg/dL — ABNORMAL HIGH (ref 70–99)
Glucose-Capillary: 139 mg/dL — ABNORMAL HIGH (ref 70–99)
Glucose-Capillary: 157 mg/dL — ABNORMAL HIGH (ref 70–99)
Glucose-Capillary: 166 mg/dL — ABNORMAL HIGH (ref 70–99)

## 2021-08-16 LAB — PHOSPHORUS: Phosphorus: 3.4 mg/dL (ref 2.5–4.6)

## 2021-08-16 LAB — CBC
HCT: 36.1 % — ABNORMAL LOW (ref 39.0–52.0)
Hemoglobin: 11.3 g/dL — ABNORMAL LOW (ref 13.0–17.0)
MCH: 35.5 pg — ABNORMAL HIGH (ref 26.0–34.0)
MCHC: 31.3 g/dL (ref 30.0–36.0)
MCV: 113.5 fL — ABNORMAL HIGH (ref 80.0–100.0)
Platelets: 383 10*3/uL (ref 150–400)
RBC: 3.18 MIL/uL — ABNORMAL LOW (ref 4.22–5.81)
RDW: 12.7 % (ref 11.5–15.5)
WBC: 4.9 10*3/uL (ref 4.0–10.5)
nRBC: 0 % (ref 0.0–0.2)

## 2021-08-16 LAB — BASIC METABOLIC PANEL
Anion gap: 8 (ref 5–15)
BUN: 23 mg/dL (ref 8–23)
CO2: 27 mmol/L (ref 22–32)
Calcium: 9 mg/dL (ref 8.9–10.3)
Chloride: 101 mmol/L (ref 98–111)
Creatinine, Ser: 0.68 mg/dL (ref 0.61–1.24)
GFR, Estimated: >60 mL/min (ref >60)
Glucose, Bld: 132 mg/dL — ABNORMAL HIGH (ref 70–99)
Potassium: 5.1 mmol/L (ref 3.5–5.1)
Sodium: 136 mmol/L (ref 135–145)

## 2021-08-16 MED ORDER — LISINOPRIL 10 MG PO TABS
10.0000 mg | ORAL_TABLET | Freq: Every day | ORAL | Status: DC
  Administered 2021-08-16: 10 mg via ORAL
  Filled 2021-08-16: qty 1

## 2021-08-16 MED ORDER — METOCLOPRAMIDE HCL 5 MG/ML IJ SOLN
5.0000 mg | Freq: Two times a day (BID) | INTRAMUSCULAR | Status: DC
  Administered 2021-08-16 – 2021-08-18 (×5): 5 mg via INTRAVENOUS
  Filled 2021-08-16 (×5): qty 2

## 2021-08-16 MED ORDER — TRAVASOL 10 % IV SOLN
INTRAVENOUS | Status: AC
  Filled 2021-08-16: qty 1183.2

## 2021-08-16 NOTE — Progress Notes (Signed)
[facility] PROGRESS NOTE    Progress Note  [name]  IDP:[id number] DOB: [birth year] DOA: 08/03/2021 PCP: Sharon Seller, NP     Brief Narrative:   [name] is an 70 y.o. male essential hypertension chronic systolic heart failure with an EF of 20%, prior history of alcoholism comes in for abdominal pain suspected enteritis and ileitis seen by CT 3 general surgery.  Failed conservative management, he subsequently developed small bowel obstruction underwent exploratory laparotomy on 08/09/2021 which revealed entrapped by dense adhesions small bowel which were lysed, started on TPN.  Bowel function returning on 11/14, diet advanced a G-tube removed has been weaned off TPN.    Assessment/Plan:   SBO (small bowel obstruction) (HCC): Status post exploratory laparotomy has been weaned NG tube. Currently on TNA Tolerated his clear diet but still nauseated. Continue further management per general surgery, who recommended a plain film of the abdomen.  Severe protein caloric malnutrition:  Ensure 3 times daily as tolerated.  Chronic systolic heart failure: Appears euvolemic with an EF of 20%. Resume ACE inhibitor.  Aortic sclerosis: Continue statin as discharged.  Leukopenia: Now resolved.  Influenza A with a positive PCR 08/13/2021: Complete his course of Tamiflu.  Acute kidney injury: Likely secondary to small bowel obstruction in the setting of ACE inhibitor use resolved with IV fluid hydration.   DVT prophylaxis: lovenox Family Communication:none Status is: Inpatient  Remains inpatient appropriate because: Acute small bowel obstruction      Code Status:     Code Status Orders  (From admission, onward)           Start     Ordered   08/03/21 1527  Full code  Continuous        08/03/21 1530           Code Status History     Date Active Date Inactive Code Status Order ID Comments User Context   05/16/2021 1719 05/17/2021 1656 Full  Code [id number]  Noel Christmas, MD Inpatient   03/04/2016 0058 03/05/2016 1657 Full Code [id number]  Doug Sou, MD ED         IV Access:   Peripheral IV   Procedures and diagnostic studies:   No results found.   Medical Consultants:   None.   Subjective:    [name] tolerated his diet this morning, no nausea.  Objective:    Vitals:   08/15/21 1615 08/15/21 1924 08/16/21 0420 08/16/21 0743  BP: 130/82 128/82 118/74 134/81  Pulse: 87 85 88 82  Resp: 18 18 17 16   Temp: 98.4 F (36.9 C) 98.3 F (36.8 C) 97.6 F (36.4 C) 98.2 F (36.8 C)  TempSrc: Oral Oral  Oral  SpO2: 100% 98% 99% 99%  Weight:   69.7 kg   Height:       SpO2: 99 % O2 Flow Rate (L/min): 1 L/min   Intake/Output Summary (Last 24 hours) at 08/16/2021 1149 Last data filed at 08/16/2021 0600 Gross per 24 hour  Intake 1299.98 ml  Output 626 ml  Net 673.98 ml   Filed Weights   08/14/21 0500 08/15/21 0424 08/16/21 0420  Weight: 70.8 kg 68.1 kg 69.7 kg    Exam: General exam: In no acute distress. Respiratory system: Good air movement and clear to auscultation. Cardiovascular system: S1 & S2 heard, RRR. No JVD. Gastrointestinal system: Positive bowel sounds soft nontender nondistended scar with staples in place, no rebound Extremities: No pedal edema. Skin: No rashes, lesions  or ulcers Psychiatry: Judgement and insight appear normal. Mood & affect appropriate.    Data Reviewed:    Labs: Basic Metabolic Panel: Recent Labs  Lab 08/10/21 0703 08/11/21 0126 08/12/21 0321 08/13/21 0323 08/14/21 0336 08/16/21 0309  NA 138 138 136 135 134* 136  K 3.8 3.9 3.7 4.1 4.1 5.1  CL 101 103 103 101 102 101  CO2 32 30 29 27 26 27   GLUCOSE 158* 138* 126* 96 131* 132*  BUN 14 12 13 14 16 23   CREATININE 0.76 0.68 0.69 0.63 0.58* 0.68  CALCIUM 8.5* 8.5* 8.6* 8.6* 8.9 9.0  MG 2.2  --  2.3 2.2 2.1 2.3  PHOS  --   --  2.9 3.6 3.8 3.4   GFR Estimated Creatinine Clearance: 85.9  mL/min (by C-G formula based on SCr of 0.68 mg/dL). Liver Function Tests: Recent Labs  Lab 08/12/21 0321 08/14/21 0336  AST 24 35  ALT 18 31  ALKPHOS 25* 31*  BILITOT 0.3 0.3  PROT 5.8* 6.1*  ALBUMIN 2.7* 2.8*   No results for input(s): LIPASE, AMYLASE in the last 168 hours. No results for input(s): AMMONIA in the last 168 hours. Coagulation profile No results for input(s): INR, PROTIME in the last 168 hours. COVID-19 Labs  No results for input(s): DDIMER, FERRITIN, LDH, CRP in the last 72 hours.  Lab Results  Component Value Date   SARSCOV2NAA NEGATIVE 08/03/2021   SARSCOV2NAA RESULT: NEGATIVE 05/12/2021    CBC: Recent Labs  Lab 08/10/21 0703 08/16/21 0810  WBC 5.9 4.9  HGB 14.4 11.3*  HCT 42.6 36.1*  MCV 96.4 113.5*  PLT 295 383   Cardiac Enzymes: No results for input(s): CKTOTAL, CKMB, CKMBINDEX, TROPONINI in the last 168 hours. BNP (last 3 results) No results for input(s): PROBNP in the last 8760 hours. CBG: Recent Labs  Lab 08/14/21 2352 08/15/21 0825 08/15/21 1542 08/15/21 2350 08/16/21 0720  GLUCAP 148* 170* 147* 153* 139*   D-Dimer: No results for input(s): DDIMER in the last 72 hours. Hgb A1c: No results for input(s): HGBA1C in the last 72 hours. Lipid Profile: Recent Labs    08/14/21 0336  TRIG 57   Thyroid function studies: No results for input(s): TSH, T4TOTAL, T3FREE, THYROIDAB in the last 72 hours.  Invalid input(s): FREET3 Anemia work up: No results for input(s): VITAMINB12, FOLATE, FERRITIN, TIBC, IRON, RETICCTPCT in the last 72 hours. Sepsis Labs: Recent Labs  Lab 08/10/21 0703 08/16/21 0810  WBC 5.9 4.9   Microbiology No results found for this or any previous visit (from the past 240 hour(s)).   Medications:    acetaminophen  1,000 mg Oral Q6H   aspirin EC  81 mg Oral Daily   Chlorhexidine Gluconate Cloth  6 each Topical Daily   enoxaparin (LOVENOX) injection  40 mg Subcutaneous Q24H   feeding supplement  237 mL  Oral TID BM   influenza vaccine adjuvanted  0.5 mL Intramuscular Tomorrow-1000   mouth rinse  15 mL Mouth Rinse BID   rosuvastatin  5 mg Oral Daily   sodium chloride flush  10-40 mL Intracatheter Q12H   Continuous Infusions:  TPN CYCLIC-ADULT (ION) 60 mL/hr at [id number]   TPN CYCLIC-ADULT (ION)        LOS: 12 days   Charlynne Cousins  [facility]  08/16/2021, 11:49 AM

## 2021-08-16 NOTE — Progress Notes (Signed)
Physical Therapy Treatment Patient Details Name: [name] MRN: [id number] DOB: July 12, 1951 Today's Date: 08/16/2021   History of Present Illness Patient is a 70 y/o male who presents on 08/03/21 due to abdominal pain and diarrhea. CT abdomen/pelvis- dilated fluid and air-filled small intestine consistent with small bowel obstruction. Per surgery, ilieus vs enteritis vs partial SBO. Exploratory lap 11/9.   PMH includes HTN, chronic systolic CHF- EF 14%, prior history of alcoholism    PT Comments    Received pt semi-reclined in bed. Pt declined getting OOB despite encouragement and education on benefits of OOB mobility. Therapist offered to assist pt to recliner, bathroom, or go for a walk and pt continued to refuse stating "I'll walk tomorrow". Pt finally agreed to bed level exercises to promote blood flow and improved circulation, but overall demonstrates decreased motivation to participate in therapy. Acute PT to cont to follow as appropriate.    Recommendations for follow up therapy are one component of a multi-disciplinary discharge planning process, led by the attending physician.  Recommendations may be updated based on patient status, additional functional criteria and insurance authorization.  Follow Up Recommendations  Home health PT     Assistance Recommended at Discharge Intermittent Supervision/Assistance  Equipment Recommendations  Rolling walker (2 wheels)    Recommendations for Other Services       Precautions / Restrictions Precautions Precautions: Fall Restrictions Weight Bearing Restrictions: No     Mobility  Bed Mobility               General bed mobility comments: declined getting OOB    Transfers                   General transfer comment: declined getting OOB to get to recliner, use restroom, or go for a walk    Ambulation/Gait               General Gait Details: declined despite encouragement stating "I'll do it  tomorrow"   Stairs             Wheelchair Mobility    Modified Rankin (Stroke Patients Only)       Balance                                            Cognition Arousal/Alertness: Awake/alert Behavior During Therapy: Flat affect Overall Cognitive Status: Within Functional Limits for tasks assessed                                 General Comments: mumbled speech        Exercises General Exercises - Lower Extremity Hip ABduction/ADduction: AROM;Both;Other (comment) (hip adduction pillow squeezes x10 and hip abduction x8 bilaterally - limited by pain) Straight Leg Raises: AROM;Both (x8 reps)    General Comments General comments (skin integrity, edema, etc.): pt refused to sit EOB, go to recliner, go to bathroom, or go for a walk despite education on benefits of OOB mobility.      Pertinent Vitals/Pain Pain Assessment: 0-10 Pain Score: 0-No pain (pt denied any pain at rest but reported increased pain with bed level exercises) Pain Location: abdomen Pain Descriptors / Indicators: Discomfort Pain Intervention(s): Monitored during session;Limited activity within patient's tolerance    Home Living  Prior Function            PT Goals (current goals can now be found in the care plan section)      Frequency    Min 3X/week      PT Plan      Co-evaluation              AM-PAC PT "6 Clicks" Mobility   Outcome Measure                   End of Session   Activity Tolerance: Patient limited by pain;Other (comment) (decreased motivation to participate.) Patient left: in bed;with call bell/phone within reach Nurse Communication: Mobility status PT Visit Diagnosis: Muscle weakness (generalized) (M62.81) Pain - part of body:  (abdomen)     Time: 8676-1950 PT Time Calculation (min) (ACUTE ONLY): 11 min  Charges:  $Therapeutic Exercise: 8-22 mins                     [name] PT, DPT  [name] 08/16/2021, 1:28 PM

## 2021-08-16 NOTE — Progress Notes (Signed)
Progress Note  7 Days Post-Op  Subjective: Did have episode of emesis 2 nights ago and was on clear liquids yesterday. He had continued nausea yesterday requiring medication. Stable abdominal pain. Having bowel movements. Ambulating some  Objective: Vital signs in last 24 hours: Temp:  [97.6 F (36.4 C)-98.4 F (36.9 C)] 97.6 F (36.4 C) (11/16 0420) Pulse Rate:  [85-98] 88 (11/16 0420) Resp:  [17-20] 17 (11/16 0420) BP: (118-140)/(74-92) 118/74 (11/16 0420) SpO2:  [98 %-100 %] 99 % (11/16 0420) Weight:  [69.7 kg] 69.7 kg (11/16 0420) Last BM Date: 08/14/21  Intake/Output from previous day: 11/15 0701 - 11/16 0700 In: 1520 [P.O.:560; I.V.:960] Out: 626 [Urine:626] Intake/Output this shift: No intake/output data recorded.  PE: General: pleasant, WD, male who is laying in bed in NAD HEENT: head is normocephalic, atraumatic.  Mouth is pink and moist Heart: regular, rate, and rhythm. Palpable radial pulses bilaterally Lungs: CTAB. Respiratory effort nonlabored Abd: soft, very mild TTP greatest along midline incision without rebound or guarding. Staples c/d/i. Mild distension.  MSK: all 4 extremities are symmetrical with no cyanosis, clubbing, or edema. No calf TTP bilaterally Skin: warm and dry with no masses, lesions, or rashes Psych: A&Ox3 with an appropriate affect.    Lab Results:  No results for input(s): WBC, HGB, HCT, PLT in the last 72 hours. BMET Recent Labs    08/14/21 0336  NA 134*  K 4.1  CL 102  CO2 26  GLUCOSE 131*  BUN 16  CREATININE 0.58*  CALCIUM 8.9    PT/INR No results for input(s): LABPROT, INR in the last 72 hours. CMP     Component Value Date/Time   NA 134 (L) 08/14/2021 0336   NA 141 11/15/2017 1054   K 4.1 08/14/2021 0336   CL 102 08/14/2021 0336   CO2 26 08/14/2021 0336   GLUCOSE 131 (H) 08/14/2021 0336   BUN 16 08/14/2021 0336   BUN 13 11/15/2017 1054   CREATININE 0.58 (L) 08/14/2021 0336   CREATININE 0.84 09/28/2020 0956    CALCIUM 8.9 08/14/2021 0336   PROT 6.1 (L) 08/14/2021 0336   PROT 6.5 09/22/2015 0949   ALBUMIN 2.8 (L) 08/14/2021 0336   ALBUMIN 3.9 09/22/2015 0949   AST 35 08/14/2021 0336   ALT 31 08/14/2021 0336   ALKPHOS 31 (L) 08/14/2021 0336   BILITOT 0.3 08/14/2021 0336   BILITOT 0.8 09/22/2015 0949   GFRNONAA >60 08/14/2021 0336   GFRNONAA 90 09/28/2020 0956   GFRAA 104 09/28/2020 0956   Lipase     Component Value Date/Time   LIPASE 24 08/03/2021 1045       Studies/Results: No results found.  Anti-infectives: Anti-infectives (From admission, onward)    Start     Dose/Rate Route Frequency Ordered Stop   08/09/21 1000  ceFAZolin (ANCEF) IVPB 2g/100 mL premix  Status:  Discontinued        2 g 200 mL/hr over 30 Minutes Intravenous To Short Stay 08/09/21 0758 08/10/21 1000   08/04/21 2200  oseltamivir (TAMIFLU) capsule 30 mg        30 mg Oral 2 times daily 08/04/21 0822 08/09/21 0959   08/04/21 1000  oseltamivir (TAMIFLU) capsule 75 mg        75 mg Oral  Once 08/04/21 0825 08/04/21 1116   08/03/21 1645  cefTRIAXone (ROCEPHIN) 2 g in sodium chloride 0.9 % 100 mL IVPB  Status:  Discontinued        2 g 200 mL/hr over 30  Minutes Intravenous Every 24 hours 08/03/21 1631 08/06/21 1519   08/03/21 1645  metroNIDAZOLE (FLAGYL) IVPB 500 mg  Status:  Discontinued        500 mg 100 mL/hr over 60 Minutes Intravenous Every 12 hours 08/03/21 1631 08/06/21 1519        Assessment/Plan  SBO POD 7 s/p ex lap LOA by Dr. Janee Morn 11/9 with findings of dense adhesion near distal ileum down to the mesentery   - NGT out 11/13 - back to clears yesterday with some nausea. Having bowel movements - afebrile on scheduled tylenol. Check CBC this am - encouraged ambulation and incentive spirometry use - encouraged working with therapies today - multimodal pain control - PICC/TNA - monitor PO intake   Continue CLD for now and await CBC. Consider CT today to evaluate for abscess given poor PO  tolerance  FEN - clears, IVF ID - ancef periop VTE - SCDs, lovenox Foley - placed periop 11/9, d/c 11/10   AKI HTN HLD COPD CHF      LOS: 12 days    Eric Form, [facility] Surgery 08/16/2021, 7:42 AM Please see Amion for pager number during day hours 7:00am-4:30pm

## 2021-08-16 NOTE — Progress Notes (Signed)
Mobility Specialist Progress Note:   08/16/21 1100  Mobility  Range of Motion/Exercises All extremities  Level of Assistance Independent  Assistive Device None  Mobility Response Tolerated well  Mobility performed by Mobility specialist  $Mobility charge 1 Mobility   Pt refused to amb this am. Agreed to bed exercises. States he will amb with PT later today.   Addison Lank Mobility Specialist  Phone [phone]

## 2021-08-16 NOTE — Progress Notes (Signed)
PHARMACY - TOTAL PARENTERAL NUTRITION CONSULT NOTE   Indication: Small bowel obstruction  Patient Measurements: Height: 6\' 1"  (185.4 cm) Weight: 69.7 kg (153 lb 10.6 oz) IBW/kg (Calculated) : 79.9 TPN AdjBW (KG): 70 Body mass index is 20.27 kg/m. Usual Weight: 74 kg  Assessment:  70 y.o. male with PMH significant for hypertension, CHF with LVEF ~25%, GERD, BPH, prior history of alcoholism who presented with complaints of lower abdominal pain found to have SBO.   Per RD's conversation with patient, reported he was eating well prior to admission. States he would have abdominal pain when eating. Pt reports typical intake of breakfast: eggs, Lunch: sandwich, Dinner: fried chicken, rice, and broccoli. Per patient 11/15, he sometimes skips meals at home even though his wife is a good cook because he just doesn't feel like eating.  Patient has remained NPO/CLD, now for more than 7 days and at risk of becoming severely malnourished. Pharmacy is consulted to initiate TPN.  Glucose / Insulin: no hx of DM, BG < 180, off insulin  Electrolytes: K 5.1 up, CoCa 9.8, others WNL Renal: Scr 0.68 stable, BUN 23 uptrending  Hepatic: LFTs / Tbili / TG WNL, albumin 2.8, TG 57 Intake / Output; MIVF: UOP 12/15 incompletely documented, small BM 11/15  GI Imaging:  11/3 CT abd: SBO  11/4 Abd XR: small partial SBO 11/6 Abd XR: Persistent SBO  11/7 Abd XR: Persistent SBO  11/8 Abd XR: SBO with interval worsening 11/9 Abd XR: Persistent small bowel obstruction GI Surgeries / Procedures:  11/9 exploratory laparotomy with lysis of adhesions    Central access: 11/10 PICC  TPN start date:  11/11  Nutritional Goals: Goal continuous TPN rate is 90 mL/hr (provides 118 g of protein and 2334 kcals per day)  RD Assessment: Estimated Needs Total Energy Estimated Needs: 2300-2500 Total Protein Estimated Needs: 115-130 grams Total Fluid Estimated Needs: > 2 L  Current Nutrition:  TPN 11/14 CLD - drank sips of  Ensure and soup 11/15 CLD - will work on drinking more. Note patient does not have his dentures, which are at home. Continued nausea and abdominal pain.     Plan:  Cycle TPN over 12 hours (rate 93-185 ml/hr, GIR 3.1-6.2 mg/kg/min) - provides 118g AA and 2056 kCal  Electrolytes in TPN: Decrease K to 30 mEq/L, Mg to 4 mEq/L; Continue Na 11mEq/L, Ca 7mEq/L, Phos 12 mmol/L; Cl:Ac 1:1 Add standard MVI and trace elements to TPN  Monitor TPN labs on Mon/Thurs  F/u PO intake, wean TPN as able   Thank you for involving pharmacy in this patient's care.  4m, PharmD, BCPS, BCCP Clinical Pharmacist  Please check AMION for all [facility] Pharmacy phone numbers After 10:00 PM, call Main Pharmacy [phone]

## 2021-08-17 DIAGNOSIS — K56609 Unspecified intestinal obstruction, unspecified as to partial versus complete obstruction: Secondary | ICD-10-CM | POA: Diagnosis not present

## 2021-08-17 DIAGNOSIS — N179 Acute kidney failure, unspecified: Secondary | ICD-10-CM | POA: Diagnosis not present

## 2021-08-17 DIAGNOSIS — K9189 Other postprocedural complications and disorders of digestive system: Secondary | ICD-10-CM | POA: Diagnosis not present

## 2021-08-17 DIAGNOSIS — I5022 Chronic systolic (congestive) heart failure: Secondary | ICD-10-CM | POA: Diagnosis not present

## 2021-08-17 LAB — MAGNESIUM: Magnesium: 2.4 mg/dL (ref 1.7–2.4)

## 2021-08-17 LAB — COMPREHENSIVE METABOLIC PANEL
ALT: 65 U/L — ABNORMAL HIGH (ref 0–44)
AST: 41 U/L (ref 15–41)
Albumin: 2.8 g/dL — ABNORMAL LOW (ref 3.5–5.0)
Alkaline Phosphatase: 57 U/L (ref 38–126)
Anion gap: 7 (ref 5–15)
BUN: 35 mg/dL — ABNORMAL HIGH (ref 8–23)
CO2: 24 mmol/L (ref 22–32)
Calcium: 8.9 mg/dL (ref 8.9–10.3)
Chloride: 103 mmol/L (ref 98–111)
Creatinine, Ser: 0.67 mg/dL (ref 0.61–1.24)
GFR, Estimated: >60 mL/min (ref >60)
Glucose, Bld: 164 mg/dL — ABNORMAL HIGH (ref 70–99)
Potassium: 4.7 mmol/L (ref 3.5–5.1)
Sodium: 134 mmol/L — ABNORMAL LOW (ref 135–145)
Total Bilirubin: 0.4 mg/dL (ref 0.3–1.2)
Total Protein: 6 g/dL — ABNORMAL LOW (ref 6.5–8.1)

## 2021-08-17 LAB — PHOSPHORUS: Phosphorus: 4.3 mg/dL (ref 2.5–4.6)

## 2021-08-17 LAB — GLUCOSE, CAPILLARY
Glucose-Capillary: 105 mg/dL — ABNORMAL HIGH (ref 70–99)
Glucose-Capillary: 146 mg/dL — ABNORMAL HIGH (ref 70–99)
Glucose-Capillary: 166 mg/dL — ABNORMAL HIGH (ref 70–99)
Glucose-Capillary: 191 mg/dL — ABNORMAL HIGH (ref 70–99)
Glucose-Capillary: 87 mg/dL (ref 70–99)
Glucose-Capillary: 98 mg/dL (ref 70–99)

## 2021-08-17 MED ORDER — LISINOPRIL 5 MG PO TABS
5.0000 mg | ORAL_TABLET | Freq: Every day | ORAL | Status: DC
  Administered 2021-08-17: 10:00:00 5 mg via ORAL
  Filled 2021-08-17 (×2): qty 1

## 2021-08-17 MED ORDER — TRAVASOL 10 % IV SOLN
INTRAVENOUS | Status: AC
  Filled 2021-08-17: qty 1183.2

## 2021-08-17 NOTE — Progress Notes (Signed)
[facility] PROGRESS NOTE    Progress Note  [name]  [id number] DOB: 1950-11-06 DOA: 08/03/2021 PCP: Lauree Chandler, NP    Brief Narrative:   [name] is an 70 y.o. male essential hypertension chronic systolic heart failure with an EF of 20%, prior history of alcoholism comes in for abdominal pain suspected enteritis and ileitis seen by CT 3 general surgery.  Failed conservative management, he subsequently developed small bowel obstruction underwent exploratory laparotomy on 08/09/2021 which revealed entrapped by dense adhesions small bowel which were lysed, started on TPN.  Bowel function returning on 11/14, diet advanced a G-tube removed has been weaned off TPN.  Assessment/Plan:   SBO (small bowel obstruction) ([place]): Status post exploratory laparotomy has been weaned NG tube. Currently on TNA, continue full liquid diet he relates he has no appetite. Abdominal x-ray showed postsurgical adynamic ileus.   Continue Ensure 3 times daily. Further management per surgery awaiting further recommendations. Avoid narcotics try to keep potassium greater than 4 magnesium greater than 2. Ambulate is much as possible.  Out of bed to chair.  Severe protein caloric malnutrition:  Ensure 3 times daily as tolerated.  Chronic systolic heart failure: Appears euvolemic with an EF of 20%. Resume ACE inhibitor.  Aortic sclerosis: Continue statin as discharged.  Leukopenia: Now resolved.  Influenza A with a positive PCR 08/13/2021: Complete his course of Tamiflu.  Acute kidney injury: Likely secondary to small bowel obstruction in the setting of ACE inhibitor use resolved with IV fluid hydration.   DVT prophylaxis: lovenox Family Communication:none Status is: Inpatient  Remains inpatient appropriate because: Acute small bowel obstruction      Code Status:     Code Status Orders  (From admission, onward)           Start     Ordered   08/03/21 1527   Full code  Continuous        08/03/21 1530           Code Status History     Date Active Date Inactive Code Status Order ID Comments User Context   05/16/2021 1719 05/17/2021 1656 Full Code [id number]  Robley Fries, MD Inpatient   03/04/2016 0058 03/05/2016 1657 Full Code [id number]  Orlie Dakin, MD ED         IV Access:   Peripheral IV   Procedures and diagnostic studies:   DG Abd Portable 1V  Result Date: 08/16/2021 CLINICAL DATA:  Nausea and vomiting today.  Ileus following surgery. EXAM: PORTABLE ABDOMEN - 1 VIEW COMPARISON:  08/09/2021. FINDINGS: Mildly dilated small bowel, most apparent in the left mid abdomen. There is residual contrast within a normal caliber right colon. Skin staples lie along the midline of the central to lower abdomen. Stomach is moderate to markedly distended. Skeletal structures are grossly intact. IMPRESSION: 1. Mild dilation of small bowel consistent with a postsurgical adynamic ileus, but with partial small bowel obstruction not excluded. There is left small-bowel dilation than was noted on the exam dated 08/09/2021, prior to surgery. Electronically Signed   By: Lajean Manes M.D.   On: 08/16/2021 13:28     Medical Consultants:   None.   Subjective:    [name] relates he has poor appetite over the last 24 hours.  Objective:    Vitals:   08/16/21 1613 08/16/21 2043 08/17/21 0332 08/17/21 0745  BP: 110/65 103/66 107/70 99/65  Pulse: 93 91 93 81  Resp: 16 18 18  18  Temp: 98.2 F (36.8 C) 97.9 F (36.6 C) 98.5 F (36.9 C) 97.7 F (36.5 C)  TempSrc: Oral Oral Oral Oral  SpO2: 99% 99% 100% 100%  Weight:   69.9 kg   Height:       SpO2: 100 % O2 Flow Rate (L/min): 1 L/min   Intake/Output Summary (Last 24 hours) at 08/17/2021 0944 Last data filed at 08/17/2021 0700 Gross per 24 hour  Intake 28.73 ml  Output 525 ml  Net -496.27 ml    Filed Weights   08/15/21 0424 08/16/21 0420 08/17/21 0332  Weight: 68.1 kg 69.7  kg 69.9 kg    Exam: General exam: In no acute distress. Respiratory system: Good air movement and clear to auscultation. Cardiovascular system: S1 & S2 heard, RRR. No JVD. Gastrointestinal system: Abdomen is nondistended, soft and nontender.  Extremities: No pedal edema. Skin: No rashes, lesions or ulcers Psychiatry: Judgement and insight appear normal. Mood & affect appropriate.   Data Reviewed:    Labs: Basic Metabolic Panel: Recent Labs  Lab 08/12/21 0321 08/13/21 0323 08/14/21 0336 08/16/21 0309 08/17/21 0436  NA 136 135 134* 136 134*  K 3.7 4.1 4.1 5.1 4.7  CL 103 101 102 101 103  CO2 29 27 26 27 24   GLUCOSE 126* 96 131* 132* 164*  BUN 13 14 16 23  35*  CREATININE 0.69 0.63 0.58* 0.68 0.67  CALCIUM 8.6* 8.6* 8.9 9.0 8.9  MG 2.3 2.2 2.1 2.3 2.4  PHOS 2.9 3.6 3.8 3.4 4.3    GFR Estimated Creatinine Clearance: 86.2 mL/min (by C-G formula based on SCr of 0.67 mg/dL). Liver Function Tests: Recent Labs  Lab 08/12/21 0321 08/14/21 0336 08/17/21 0436  AST 24 35 41  ALT 18 31 65*  ALKPHOS 25* 31* 57  BILITOT 0.3 0.3 0.4  PROT 5.8* 6.1* 6.0*  ALBUMIN 2.7* 2.8* 2.8*    No results for input(s): LIPASE, AMYLASE in the last 168 hours. No results for input(s): AMMONIA in the last 168 hours. Coagulation profile No results for input(s): INR, PROTIME in the last 168 hours. COVID-19 Labs  No results for input(s): DDIMER, FERRITIN, LDH, CRP in the last 72 hours.  Lab Results  Component Value Date   SARSCOV2NAA NEGATIVE 08/03/2021   SARSCOV2NAA RESULT: NEGATIVE 05/12/2021    CBC: Recent Labs  Lab 08/16/21 0810  WBC 4.9  HGB 11.3*  HCT 36.1*  MCV 113.5*  PLT 383    Cardiac Enzymes: No results for input(s): CKTOTAL, CKMB, CKMBINDEX, TROPONINI in the last 168 hours. BNP (last 3 results) No results for input(s): PROBNP in the last 8760 hours. CBG: Recent Labs  Lab 08/16/21 1212 08/16/21 2002 08/16/21 2347 08/17/21 0326 08/17/21 0748  GLUCAP 103*  166* 157* 146* 105*    D-Dimer: No results for input(s): DDIMER in the last 72 hours. Hgb A1c: No results for input(s): HGBA1C in the last 72 hours. Lipid Profile: No results for input(s): CHOL, HDL, LDLCALC, TRIG, CHOLHDL, LDLDIRECT in the last 72 hours.  Thyroid function studies: No results for input(s): TSH, T4TOTAL, T3FREE, THYROIDAB in the last 72 hours.  Invalid input(s): FREET3 Anemia work up: No results for input(s): VITAMINB12, FOLATE, FERRITIN, TIBC, IRON, RETICCTPCT in the last 72 hours. Sepsis Labs: Recent Labs  Lab 08/16/21 0810  WBC 4.9    Microbiology No results found for this or any previous visit (from the past 240 hour(s)).   Medications:    acetaminophen  1,000 mg Oral Q6H   aspirin EC  81  mg Oral Daily   Chlorhexidine Gluconate Cloth  6 each Topical Daily   enoxaparin (LOVENOX) injection  40 mg Subcutaneous Q24H   feeding supplement  237 mL Oral TID BM   influenza vaccine adjuvanted  0.5 mL Intramuscular Tomorrow-1000   lisinopril  10 mg Oral Daily   mouth rinse  15 mL Mouth Rinse BID   metoCLOPramide (REGLAN) injection  5 mg Intravenous Q12H   rosuvastatin  5 mg Oral Daily   sodium chloride flush  10-40 mL Intracatheter Q12H   Continuous Infusions:      LOS: 13 days   Marinda Elk  [facility]  08/17/2021, 9:44 AM

## 2021-08-17 NOTE — Progress Notes (Signed)
Mobility Specialist Progress Note:   08/17/21 1420  Mobility  Activity Ambulated in room  Level of Assistance Contact guard assist, steadying assist  Assistive Device Front wheel walker  Distance Ambulated (ft) 15 ft  Mobility Out of bed for toileting  Mobility Response Tolerated well  Mobility performed by Mobility specialist  $Mobility charge 1 Mobility   Responded to BR call from pt. BM successful. Assisted pt back to bed.   Addison Lank Mobility Specialist  Phone [phone]

## 2021-08-17 NOTE — Progress Notes (Signed)
PHARMACY - TOTAL PARENTERAL NUTRITION CONSULT NOTE   Indication: Small bowel obstruction  Patient Measurements: Height: 6\' 1"  (185.4 cm) Weight: 69.9 kg (154 lb 1.6 oz) IBW/kg (Calculated) : 79.9 TPN AdjBW (KG): 70 Body mass index is 20.33 kg/m. Usual Weight: 74 kg  Assessment:  70 y.o. male with PMH significant for hypertension, CHF with LVEF ~25%, GERD, BPH, prior history of alcoholism who presented with complaints of lower abdominal pain found to have SBO.   Per RD's conversation with patient, reported he was eating well prior to admission. States he would have abdominal pain when eating. Pt reports typical intake of breakfast: eggs, Lunch: sandwich, Dinner: fried chicken, rice, and broccoli. Per patient 11/15, he sometimes skips meals at home even though his wife is a good cook because he just doesn't feel like eating.  Patient has remained NPO/CLD, now for more than 7 days and at risk of becoming severely malnourished. Pharmacy is consulted to initiate TPN.  Glucose / Insulin: no hx of DM, BG < 180, off insulin  Electrolytes: K 4.7 down, Phos 4.3 up, Mg 2.4 Up, CoCa 9.8, others WNL Renal: Scr 0.6 stable, BUN 35 uptrending  Hepatic: LFTs / Tbili / TG WNL, albumin 2.8, TG 57 Intake / Output; MIVF: UOP 12/15 incompletely documented, small BM 11/15  GI Imaging:  11/3 CT abd: SBO  11/4 Abd XR: small partial SBO 11/6 Abd XR: Persistent SBO  11/7 Abd XR: Persistent SBO  11/8 Abd XR: SBO with interval worsening 11/9 Abd XR: Persistent small bowel obstruction GI Surgeries / Procedures:  11/9 exploratory laparotomy with lysis of adhesions    Central access: 11/10 PICC  TPN start date:  11/11  Nutritional Goals: Goal continuous TPN rate is 90 mL/hr (provides 118 g of protein and 2334 kcals per day)  RD Assessment: Estimated Needs Total Energy Estimated Needs: 2300-2500 Total Protein Estimated Needs: 115-130 grams Total Fluid Estimated Needs: > 2 L  Current Nutrition:   TPN 11/14 CLD - drank sips of Ensure and soup 11/15 CLD - will work on drinking more. Note patient does not have his dentures, which are at home. Continued nausea and abdominal pain.  11/16 drank 1 Ensure and 1.5 juices, no appetite per patient   11/17 Soft diet -encouraged patient to order soup and mashed potatoes   Plan:  Cycle TPN over 12 hours (rate 93-185 ml/hr, GIR 3.1-6.2 mg/kg/min) - provides 118g AA and 2056 kCal  Electrolytes in TPN: increase Na 172mEq/L (max that can fit in bag); Decrease K to 15 mEq/L,  Phos 5 mmol/L, Mg to 0 mEq/L; Continue Ca 23mEq/L, Cl:Ac 1:1 Add standard MVI and trace elements to TPN  Monitor TPN labs on Mon/Thurs  F/u PO intake, wean TPN as able   Thank you for involving pharmacy in this patient's care.  4m, PharmD, BCPS, BCCP Clinical Pharmacist  Please check AMION for all [facility] phone numbers After 10:00 PM, call Main Pharmacy [phone]

## 2021-08-17 NOTE — Progress Notes (Signed)
Mobility Specialist Progress Note:   08/17/21 1045  Mobility  Activity Ambulated to bathroom  Level of Assistance Contact guard assist, steadying assist  Assistive Device Front wheel walker  Distance Ambulated (ft) 20 ft  Mobility Out of bed for toileting  Mobility Response Tolerated well  Mobility performed by Mobility specialist  $Mobility charge 1 Mobility   Pt asx during ambulation. Displayed generalized weakness. Stressed importance of ambulation, pt voiced understanding. States he will amb in hallway tomorrow.   Addison Lank Mobility Specialist  Phone [phone]

## 2021-08-17 NOTE — Plan of Care (Signed)

## 2021-08-17 NOTE — Progress Notes (Signed)
Progress Note  8 Days Post-Op  Subjective: He does confirm some nausea last night for which he received zofran. He has not eaten much of the full liquids and grits are still sitting untouched on his tray this morning. He will try to eat them later. He denies nausea or emesis currently. Abdominal pain is stable. He states he got OOB yesterday. Passing flatus. No BM yesterday  Objective: Vital signs in last 24 hours: Temp:  [97.7 F (36.5 C)-98.5 F (36.9 C)] 97.7 F (36.5 C) (11/17 0745) Pulse Rate:  [81-93] 81 (11/17 0745) Resp:  [16-18] 18 (11/17 0745) BP: (99-110)/(65-70) 99/65 (11/17 0745) SpO2:  [99 %-100 %] 100 % (11/17 0745) Weight:  [69.9 kg] 69.9 kg (11/17 0332) Last BM Date: 08/14/21  Intake/Output from previous day: 11/16 0701 - 11/17 0700 In: 28.7 [I.V.:28.7] Out: 525 [Urine:525] Intake/Output this shift: No intake/output data recorded.  PE: General: pleasant, WD, male who is laying in bed in NAD HEENT: head is normocephalic, atraumatic.  Mouth is pink and moist Heart: regular, rate, and rhythm. Palpable radial pulses bilaterally Lungs: Respiratory effort nonlabored Abd: soft, mild to moderate distension. Very mild TTP greatest along midline incision without rebound or guarding. Staples c/d/i.   MSK: all 4 extremities are symmetrical with no cyanosis, clubbing, or edema. No calf TTP bilaterally Skin: warm and dry with no masses, lesions, or rashes Psych: A&Ox3 with an appropriate affect.    Lab Results:  Recent Labs    08/16/21 0810  WBC 4.9  HGB 11.3*  HCT 36.1*  PLT 383   BMET Recent Labs    08/16/21 0309 08/17/21 0436  NA 136 134*  K 5.1 4.7  CL 101 103  CO2 27 24  GLUCOSE 132* 164*  BUN 23 35*  CREATININE 0.68 0.67  CALCIUM 9.0 8.9    PT/INR No results for input(s): LABPROT, INR in the last 72 hours. CMP     Component Value Date/Time   NA 134 (L) 08/17/2021 0436   NA 141 11/15/2017 1054   K 4.7 08/17/2021 0436   CL 103 08/17/2021  0436   CO2 24 08/17/2021 0436   GLUCOSE 164 (H) 08/17/2021 0436   BUN 35 (H) 08/17/2021 0436   BUN 13 11/15/2017 1054   CREATININE 0.67 08/17/2021 0436   CREATININE 0.84 09/28/2020 0956   CALCIUM 8.9 08/17/2021 0436   PROT 6.0 (L) 08/17/2021 0436   PROT 6.5 09/22/2015 0949   ALBUMIN 2.8 (L) 08/17/2021 0436   ALBUMIN 3.9 09/22/2015 0949   AST 41 08/17/2021 0436   ALT 65 (H) 08/17/2021 0436   ALKPHOS 57 08/17/2021 0436   BILITOT 0.4 08/17/2021 0436   BILITOT 0.8 09/22/2015 0949   GFRNONAA >60 08/17/2021 0436   GFRNONAA 90 09/28/2020 0956   GFRAA 104 09/28/2020 0956   Lipase     Component Value Date/Time   LIPASE 24 08/03/2021 1045       Studies/Results: DG Abd Portable 1V  Result Date: 08/16/2021 CLINICAL DATA:  Nausea and vomiting today.  Ileus following surgery. EXAM: PORTABLE ABDOMEN - 1 VIEW COMPARISON:  08/09/2021. FINDINGS: Mildly dilated small bowel, most apparent in the left mid abdomen. There is residual contrast within a normal caliber right colon. Skin staples lie along the midline of the central to lower abdomen. Stomach is moderate to markedly distended. Skeletal structures are grossly intact. IMPRESSION: 1. Mild dilation of small bowel consistent with a postsurgical adynamic ileus, but with partial small bowel obstruction not excluded. There is  left small-bowel dilation than was noted on the exam dated 08/09/2021, prior to surgery. Electronically Signed   By: Amie Portland M.D.   On: 08/16/2021 13:28    Anti-infectives: Anti-infectives (From admission, onward)    Start     Dose/Rate Route Frequency Ordered Stop   08/09/21 1000  ceFAZolin (ANCEF) IVPB 2g/100 mL premix  Status:  Discontinued        2 g 200 mL/hr over 30 Minutes Intravenous To Short Stay 08/09/21 0758 08/10/21 1000   08/04/21 2200  oseltamivir (TAMIFLU) capsule 30 mg        30 mg Oral 2 times daily 08/04/21 0822 08/09/21 0959   08/04/21 1000  oseltamivir (TAMIFLU) capsule 75 mg        75 mg Oral   Once 08/04/21 0825 08/04/21 1116   08/03/21 1645  cefTRIAXone (ROCEPHIN) 2 g in sodium chloride 0.9 % 100 mL IVPB  Status:  Discontinued        2 g 200 mL/hr over 30 Minutes Intravenous Every 24 hours 08/03/21 1631 08/06/21 1519   08/03/21 1645  metroNIDAZOLE (FLAGYL) IVPB 500 mg  Status:  Discontinued        500 mg 100 mL/hr over 60 Minutes Intravenous Every 12 hours 08/03/21 1631 08/06/21 1519        Assessment/Plan  SBO POD 8 s/p ex lap LOA by Dr. Janee Morn 11/9 with findings of dense adhesion near distal ileum down to the mesentery   - NGT out 11/13 - afebrile on scheduled tylenol. WBC 4.9 - abd xray 11/16 with Mild dilation of small bowel consistent with a postsurgical adynamic ileus, but with partial small bowel obstruction not excluded - started scheduled reglan for nausea last night. We discussed possible side effects of this medication to which he expressed understanding - encouraged ambulation and incentive spirometry use  - multimodal pain control - PICC/TNA - monitor PO intake. Cycling TPN per pharmacy  Continue FLD for now as he has not had much intake. Advance as tolerated to soft  FEN - FLD ADAT soft, IVF ID - ancef periop VTE - SCDs, lovenox Foley - placed periop 11/9, d/c 11/10   AKI HTN HLD COPD CHF      LOS: 13 days    Eric Form, [facility] Surgery 08/17/2021, 8:06 AM Please see Amion for pager number during day hours 7:00am-4:30pm

## 2021-08-18 DIAGNOSIS — I5022 Chronic systolic (congestive) heart failure: Secondary | ICD-10-CM | POA: Diagnosis not present

## 2021-08-18 DIAGNOSIS — K9189 Other postprocedural complications and disorders of digestive system: Secondary | ICD-10-CM | POA: Diagnosis not present

## 2021-08-18 DIAGNOSIS — K56609 Unspecified intestinal obstruction, unspecified as to partial versus complete obstruction: Secondary | ICD-10-CM | POA: Diagnosis not present

## 2021-08-18 DIAGNOSIS — N179 Acute kidney failure, unspecified: Secondary | ICD-10-CM | POA: Diagnosis not present

## 2021-08-18 LAB — GLUCOSE, CAPILLARY
Glucose-Capillary: 146 mg/dL — ABNORMAL HIGH (ref 70–99)
Glucose-Capillary: 105 mg/dL — ABNORMAL HIGH (ref 70–99)
Glucose-Capillary: 93 mg/dL (ref 70–99)
Glucose-Capillary: 98 mg/dL (ref 70–99)
Glucose-Capillary: 148 mg/dL — ABNORMAL HIGH (ref 70–99)

## 2021-08-18 MED ORDER — ADULT MULTIVITAMIN W/MINERALS CH: 1.0000 | ORAL_TABLET | Freq: Every day | ORAL | Status: DC

## 2021-08-18 MED ORDER — TRAVASOL 10 % IV SOLN
INTRAVENOUS | Status: AC
  Filled 2021-08-18: qty 1029.6

## 2021-08-18 MED ORDER — SORBITOL 70 % SOLN
30.0000 mL | Freq: Once | Status: AC
  Administered 2021-08-18: 30 mL via ORAL
  Filled 2021-08-18: qty 30

## 2021-08-18 NOTE — Progress Notes (Signed)
[facility] PROGRESS NOTE    Progress Note  [name]  [id number] DOB: 03-23-1951 DOA: 08/03/2021 PCP: Lauree Chandler, NP    Brief Narrative:   [name] is an 70 y.o. male essential hypertension chronic systolic heart failure with an EF of 20%, prior history of alcoholism comes in for abdominal pain suspected enteritis and ileitis seen by CT 3 general surgery.  Failed conservative management, he subsequently developed small bowel obstruction underwent exploratory laparotomy on 08/09/2021 which revealed entrapped by dense adhesions small bowel which were lysed, started on TPN.  Bowel function returning on 11/14, diet advanced a G-tube removed has been weaned off TPN.  Assessment/Plan:   SBO (small bowel obstruction) ([place]): Status post exploratory laparotomy has been weaned NG tube. Currently on TNA, continue full liquid diet he relates he has no appetite. Not eating well while eating about 20% of his diet, he seen to have a lack of motivation. Also not ambulating as much as we would want. Out of bed to chair encourage ambulation is much as possible. Surgery recommended to continue full liquid diet.    Severe protein caloric malnutrition:  Ensure 3 times daily as tolerated.  Chronic systolic heart failure: Appears euvolemic with an EF of 20%. Hold ACE inhibitor blood pressure is borderline.  Aortic sclerosis: Continue statin as discharged.  Leukopenia: Now resolved.  Influenza A with a positive PCR 08/13/2021: Complete his course of Tamiflu.  Acute kidney injury: Likely secondary to small bowel obstruction in the setting of ACE inhibitor use resolved with IV fluid hydration.   DVT prophylaxis: lovenox Family Communication:none Status is: Inpatient  Remains inpatient appropriate because: Acute small bowel obstruction      Code Status:     Code Status Orders  (From admission, onward)           Start     Ordered   08/03/21 1527  Full  code  Continuous        08/03/21 1530           Code Status History     Date Active Date Inactive Code Status Order ID Comments User Context   05/16/2021 1719 05/17/2021 1656 Full Code [id number]  Robley Fries, MD Inpatient   03/04/2016 0058 03/05/2016 1657 Full Code [id number]  Orlie Dakin, MD ED         IV Access:   Peripheral IV   Procedures and diagnostic studies:   DG Abd Portable 1V  Result Date: 08/16/2021 CLINICAL DATA:  Nausea and vomiting today.  Ileus following surgery. EXAM: PORTABLE ABDOMEN - 1 VIEW COMPARISON:  08/09/2021. FINDINGS: Mildly dilated small bowel, most apparent in the left mid abdomen. There is residual contrast within a normal caliber right colon. Skin staples lie along the midline of the central to lower abdomen. Stomach is moderate to markedly distended. Skeletal structures are grossly intact. IMPRESSION: 1. Mild dilation of small bowel consistent with a postsurgical adynamic ileus, but with partial small bowel obstruction not excluded. There is left small-bowel dilation than was noted on the exam dated 08/09/2021, prior to surgery. Electronically Signed   By: Lajean Manes M.D.   On: 08/16/2021 13:28     Medical Consultants:   None.   Subjective:    [name] relates poor appetite.  Objective:    Vitals:   08/17/21 1718 08/17/21 2026 08/18/21 0352 08/18/21 0825  BP: 115/72 123/76 128/68 105/65  Pulse: 92 82 87 83  Resp: 18 16 17  18  Temp: 97.9 F (36.6 C) 98.8 F (37.1 C) 98.1 F (36.7 C) 97.8 F (36.6 C)  TempSrc: Oral Oral Oral Oral  SpO2: 100% 98% 100% 100%  Weight:   69.8 kg   Height:       SpO2: 100 % O2 Flow Rate (L/min): 1 L/min   Intake/Output Summary (Last 24 hours) at 08/18/2021 0939 Last data filed at 08/18/2021 0900 Gross per 24 hour  Intake 2203.36 ml  Output 1020 ml  Net 1183.36 ml    Filed Weights   08/16/21 0420 08/17/21 0332 08/18/21 0352  Weight: 69.7 kg 69.9 kg 69.8 kg     Exam: General exam: In no acute distress. Respiratory system: Good air movement and clear to auscultation. Cardiovascular system: S1 & S2 heard, RRR. No JVD. Gastrointestinal system: Abdomen is nondistended, soft and nontender.  Extremities: No pedal edema. Skin: No rashes, lesions or ulcers Psychiatry: Judgement and insight appear normal. Mood & affect appropriate.   Data Reviewed:    Labs: Basic Metabolic Panel: Recent Labs  Lab 08/12/21 0321 08/13/21 0323 08/14/21 0336 08/16/21 0309 08/17/21 0436  NA 136 135 134* 136 134*  K 3.7 4.1 4.1 5.1 4.7  CL 103 101 102 101 103  CO2 29 27 26 27 24   GLUCOSE 126* 96 131* 132* 164*  BUN 13 14 16 23  35*  CREATININE 0.69 0.63 0.58* 0.68 0.67  CALCIUM 8.6* 8.6* 8.9 9.0 8.9  MG 2.3 2.2 2.1 2.3 2.4  PHOS 2.9 3.6 3.8 3.4 4.3    GFR Estimated Creatinine Clearance: 86 mL/min (by C-G formula based on SCr of 0.67 mg/dL). Liver Function Tests: Recent Labs  Lab 08/12/21 0321 08/14/21 0336 08/17/21 0436  AST 24 35 41  ALT 18 31 65*  ALKPHOS 25* 31* 57  BILITOT 0.3 0.3 0.4  PROT 5.8* 6.1* 6.0*  ALBUMIN 2.7* 2.8* 2.8*    No results for input(s): LIPASE, AMYLASE in the last 168 hours. No results for input(s): AMMONIA in the last 168 hours. Coagulation profile No results for input(s): INR, PROTIME in the last 168 hours. COVID-19 Labs  No results for input(s): DDIMER, FERRITIN, LDH, CRP in the last 72 hours.  Lab Results  Component Value Date   SARSCOV2NAA NEGATIVE 08/03/2021   SARSCOV2NAA RESULT: NEGATIVE 05/12/2021    CBC: Recent Labs  Lab 08/16/21 0810  WBC 4.9  HGB 11.3*  HCT 36.1*  MCV 113.5*  PLT 383    Cardiac Enzymes: No results for input(s): CKTOTAL, CKMB, CKMBINDEX, TROPONINI in the last 168 hours. BNP (last 3 results) No results for input(s): PROBNP in the last 8760 hours. CBG: Recent Labs  Lab 08/17/21 1721 08/17/21 2025 08/17/21 2352 08/18/21 0356 08/18/21 0827  GLUCAP 87 191* 166* 146*  105*    D-Dimer: No results for input(s): DDIMER in the last 72 hours. Hgb A1c: No results for input(s): HGBA1C in the last 72 hours. Lipid Profile: No results for input(s): CHOL, HDL, LDLCALC, TRIG, CHOLHDL, LDLDIRECT in the last 72 hours.  Thyroid function studies: No results for input(s): TSH, T4TOTAL, T3FREE, THYROIDAB in the last 72 hours.  Invalid input(s): FREET3 Anemia work up: No results for input(s): VITAMINB12, FOLATE, FERRITIN, TIBC, IRON, RETICCTPCT in the last 72 hours. Sepsis Labs: Recent Labs  Lab 08/16/21 0810  WBC 4.9    Microbiology No results found for this or any previous visit (from the past 240 hour(s)).   Medications:    acetaminophen  1,000 mg Oral Q6H   aspirin EC  81  mg Oral Daily   Chlorhexidine Gluconate Cloth  6 each Topical Daily   enoxaparin (LOVENOX) injection  40 mg Subcutaneous Q24H   feeding supplement  237 mL Oral TID BM   influenza vaccine adjuvanted  0.5 mL Intramuscular Tomorrow-1000   lisinopril  5 mg Oral Daily   mouth rinse  15 mL Mouth Rinse BID   metoCLOPramide (REGLAN) injection  5 mg Intravenous Q12H   rosuvastatin  5 mg Oral Daily   sodium chloride flush  10-40 mL Intracatheter Q12H   Continuous Infusions:  TPN CYCLIC-ADULT (ION)         LOS: 14 days   Charlynne Cousins  [facility]  08/18/2021, 9:39 AM

## 2021-08-18 NOTE — Progress Notes (Signed)
Progress Note  9 Days Post-Op  Subjective: BM yesterday and reports feeling slightly nauseas last night - he describes the episode as having some increased crampy abdominal pain with increased saliva in his mouth similar to before throwing up. No emesis. He does admit to low appetite. Abdominal pain stable. He states he has been OOB but per discussion with nursing he does not ambulate much   Objective: Vital signs in last 24 hours: Temp:  [97.7 F (36.5 C)-98.8 F (37.1 C)] 98.1 F (36.7 C) (11/18 0352) Pulse Rate:  [81-92] 87 (11/18 0352) Resp:  [16-18] 17 (11/18 0352) BP: (99-128)/(65-76) 128/68 (11/18 0352) SpO2:  [98 %-100 %] 100 % (11/18 0352) Weight:  [69.8 kg] 69.8 kg (11/18 0352) Last BM Date: 08/14/21  Intake/Output from previous day: 11/17 0701 - 11/18 0700 In: 2193.4 [P.O.:240; I.V.:1953.4] Out: 820 [Urine:820] Intake/Output this shift: No intake/output data recorded.  PE: General: pleasant, WD, male who is laying in bed in NAD HEENT: head is normocephalic, atraumatic.  Mouth is pink and moist Heart: regular, rate, and rhythm. Palpable radial pulses bilaterally Lungs: Respiratory effort nonlabored Abd: soft, mild to moderate distension. Very mild TTP greatest along midline incision without rebound or guarding. Staples c/d/i.   MSK: all 4 extremities are symmetrical with no cyanosis, clubbing, or edema. No calf TTP bilaterally Skin: warm and dry with no masses, lesions, or rashes Psych: A&Ox3 with an appropriate affect.    Lab Results:  Recent Labs    08/16/21 0810  WBC 4.9  HGB 11.3*  HCT 36.1*  PLT 383    BMET Recent Labs    08/16/21 0309 08/17/21 0436  NA 136 134*  K 5.1 4.7  CL 101 103  CO2 27 24  GLUCOSE 132* 164*  BUN 23 35*  CREATININE 0.68 0.67  CALCIUM 9.0 8.9    PT/INR No results for input(s): LABPROT, INR in the last 72 hours. CMP     Component Value Date/Time   NA 134 (L) 08/17/2021 0436   NA 141 11/15/2017 1054   K 4.7  08/17/2021 0436   CL 103 08/17/2021 0436   CO2 24 08/17/2021 0436   GLUCOSE 164 (H) 08/17/2021 0436   BUN 35 (H) 08/17/2021 0436   BUN 13 11/15/2017 1054   CREATININE 0.67 08/17/2021 0436   CREATININE 0.84 09/28/2020 0956   CALCIUM 8.9 08/17/2021 0436   PROT 6.0 (L) 08/17/2021 0436   PROT 6.5 09/22/2015 0949   ALBUMIN 2.8 (L) 08/17/2021 0436   ALBUMIN 3.9 09/22/2015 0949   AST 41 08/17/2021 0436   ALT 65 (H) 08/17/2021 0436   ALKPHOS 57 08/17/2021 0436   BILITOT 0.4 08/17/2021 0436   BILITOT 0.8 09/22/2015 0949   GFRNONAA >60 08/17/2021 0436   GFRNONAA 90 09/28/2020 0956   GFRAA 104 09/28/2020 0956   Lipase     Component Value Date/Time   LIPASE 24 08/03/2021 1045       Studies/Results: DG Abd Portable 1V  Result Date: 08/16/2021 CLINICAL DATA:  Nausea and vomiting today.  Ileus following surgery. EXAM: PORTABLE ABDOMEN - 1 VIEW COMPARISON:  08/09/2021. FINDINGS: Mildly dilated small bowel, most apparent in the left mid abdomen. There is residual contrast within a normal caliber right colon. Skin staples lie along the midline of the central to lower abdomen. Stomach is moderate to markedly distended. Skeletal structures are grossly intact. IMPRESSION: 1. Mild dilation of small bowel consistent with a postsurgical adynamic ileus, but with partial small bowel obstruction not excluded.  There is left small-bowel dilation than was noted on the exam dated 08/09/2021, prior to surgery. Electronically Signed   By: Amie Portland M.D.   On: 08/16/2021 13:28    Anti-infectives: Anti-infectives (From admission, onward)    Start     Dose/Rate Route Frequency Ordered Stop   08/09/21 1000  ceFAZolin (ANCEF) IVPB 2g/100 mL premix  Status:  Discontinued        2 g 200 mL/hr over 30 Minutes Intravenous To Short Stay 08/09/21 0758 08/10/21 1000   08/04/21 2200  oseltamivir (TAMIFLU) capsule 30 mg        30 mg Oral 2 times daily 08/04/21 0822 08/09/21 0959   08/04/21 1000  oseltamivir  (TAMIFLU) capsule 75 mg        75 mg Oral  Once 08/04/21 0825 08/04/21 1116   08/03/21 1645  cefTRIAXone (ROCEPHIN) 2 g in sodium chloride 0.9 % 100 mL IVPB  Status:  Discontinued        2 g 200 mL/hr over 30 Minutes Intravenous Every 24 hours 08/03/21 1631 08/06/21 1519   08/03/21 1645  metroNIDAZOLE (FLAGYL) IVPB 500 mg  Status:  Discontinued        500 mg 100 mL/hr over 60 Minutes Intravenous Every 12 hours 08/03/21 1631 08/06/21 1519        Assessment/Plan  SBO POD 9 s/p ex lap LOA by Dr. Janee Morn 11/9 with findings of dense adhesion near distal ileum down to the mesentery   - NGT out 11/13 - afebrile on scheduled tylenol - abd xray 11/16 with Mild dilation of small bowel consistent with a postsurgical adynamic ileus, but with partial small bowel obstruction not excluded - started scheduled reglan for nausea 11/16 pm. - encouraged ambulation and incentive spirometry use. Talked with him about walking in the hallway at least 5 times today  - multimodal pain control - PICC/TNA - monitor PO intake. Discussed with pharmacy and will decrease TPN to 75% to help with appetite  Advance to soft diet for lunch and work on weaning TPN. Hopefully discharge soon as PO intake improves  FEN - FLD advance to soft lunch, IVF ID - ancef periop VTE - SCDs, lovenox Foley - placed periop 11/9, d/c 11/10   AKI HTN HLD COPD CHF      LOS: 14 days    Eric Form, [facility] 08/18/2021, 7:44 AM Please see Amion for pager number during day hours 7:00am-4:30pm

## 2021-08-18 NOTE — Progress Notes (Signed)
PHARMACY - TOTAL PARENTERAL NUTRITION CONSULT NOTE   Indication: Small bowel obstruction  Patient Measurements: Height: 6\' 1"  (185.4 cm) Weight: 69.8 kg (153 lb 14.1 oz) IBW/kg (Calculated) : 79.9 TPN AdjBW (KG): 70 Body mass index is 20.3 kg/m. Usual Weight: 74 kg  Assessment:  70 y.o. male with PMH significant for hypertension, CHF with LVEF ~25%, GERD, BPH, prior history of alcoholism who presented with complaints of lower abdominal pain found to have SBO.   Per RD's conversation with patient, reported he was eating well prior to admission. States he would have abdominal pain when eating. Pt reports typical intake of breakfast: eggs, Lunch: sandwich, Dinner: fried chicken, rice, and broccoli. Per patient 11/15, he sometimes skips meals at home even though his wife is a good cook because he just doesn't feel like eating.  Patient has remained NPO/CLD, now for more than 7 days and at risk of becoming severely malnourished. Pharmacy is consulted to initiate TPN.  Glucose / Insulin: no hx of DM, BG < 180, off insulin  Electrolytes: K 4.7 down, Phos 4.3 up, Mg 2.4 Up, CoCa 9.8, others WNL Renal: Scr 0.6 stable, BUN 35 uptrending  Hepatic: LFTs / Tbili / TG WNL, albumin 2.8, TG 57 Intake / Output; MIVF: UOP 0.56ml/kg/hr, BM 11/17  GI Imaging:  11/3 CT abd: SBO  11/4 Abd XR: small partial SBO 11/6 Abd XR: Persistent SBO  11/7 Abd XR: Persistent SBO  11/8 Abd XR: SBO with interval worsening 11/9 Abd XR: Persistent small bowel obstruction GI Surgeries / Procedures:  11/9 exploratory laparotomy with lysis of adhesions    Central access: 11/10 PICC  TPN start date:  11/11  Nutritional Goals: Goal continuous TPN rate is 90 mL/hr (provides 118 g of protein and 2334 kcals per day)  RD Assessment: Estimated Needs Total Energy Estimated Needs: 2300-2500 Total Protein Estimated Needs: 115-130 grams Total Fluid Estimated Needs: > 2 L  Current Nutrition:  TPN 11/14 CLD - drank sips of  Ensure and soup 11/15 CLD - will work on drinking more. Note patient does not have his dentures, which are at home. Continued nausea and abdominal pain.  11/16 drank 1 Ensure and 1.5 juices, no appetite per patient   11/17 FLD - patient does not remember what he ate  11/18 FLD sitting up and starting breakfast - grits, yogurt, coffee, 2 juices, 2 ice teas, 1 Ensure, 1 boost on tray, to Soft diet for lunch   Plan:   Discussed with Surgery, will decrease TPN to 75% to help with appetite given that patient has signs of improved SBO but continues to have poor PO intake  Cycle TPN over 12 hours (rate 80-160 ml/hr, GIR 2.67-5.35 mg/kg/min) - provides 103g AA, 246g dextrose, 53g lipids, and 1778 kCal, meeting ~75% of estimated needs)  Electrolytes in TPN: Continue Na 174mEq/L (max that can fit in bag), K 15 mEq/L,  Mg to 0 mEq/L, Ca 5 mEq/L, Phos 6 mmol/L; Cl:Ac 1:1 Remove MVI and trace elements from TPN, ordered as PO tablet  Monitor TPN labs 11/19 and on Mon/Thurs  F/u PO intake, wean TPN as able   Thank you for involving pharmacy in this patient's care.  12/19, PharmD, BCPS, BCCP Clinical Pharmacist  Please check AMION for all [facility] Pharmacy phone numbers After 10:00 PM, call Main Pharmacy [phone]

## 2021-08-18 NOTE — Progress Notes (Signed)
Occupational Therapy Treatment Patient Details Name: [name] MRN: [id number] DOB: June 24, 1951 Today's Date: 08/18/2021   History of present illness Patient is a 70 y/o male who presents on 08/03/21 due to abdominal pain and diarrhea. CT abdomen/pelvis- dilated fluid and air-filled small intestine consistent with small bowel obstruction. Per surgery, ilieus vs enteritis vs partial SBO. Exploratory lap 11/9.   PMH includes HTN, chronic systolic CHF- EF 11%, prior history of alcoholism   OT comments  Patient received in bed and was in pleasant and eager to get out of bed. Patient was supervision to get to eob and asked to use bathroom to void. Patient was supervision for toileting and grooming standing at sink. Patient transferred to recliner and asked to return to bathroom to void again.  Patient performed functional mobility with RW with supervision for safety. Once back in room patient asked to use bathroom at third time but did not void. Patient is making good gains with OT treatment. Acute OT to continue to follow.    Recommendations for follow up therapy are one component of a multi-disciplinary discharge planning process, led by the attending physician.  Recommendations may be updated based on patient status, additional functional criteria and insurance authorization.    Follow Up Recommendations  No OT follow up    Assistance Recommended at Discharge Intermittent Supervision/Assistance  Equipment Recommendations  None recommended by OT    Recommendations for Other Services      Precautions / Restrictions Precautions Precautions: Fall Restrictions Weight Bearing Restrictions: No       Mobility Bed Mobility Overal bed mobility: Needs Assistance Bed Mobility: Rolling;Sidelying to Sit;Sit to Sidelying Rolling: Supervision Sidelying to sit: Supervision Supine to sit: Supervision     General bed mobility comments: verbal cues to initiate    Transfers Overall transfer  level: Needs assistance Equipment used: Rolling walker (2 wheels) Transfers: Sit to/from Stand Sit to Stand: Supervision           General transfer comment: verbal cues to push up to stand     Balance Overall balance assessment: Needs assistance;History of Falls Sitting-balance support: Feet supported Sitting balance-Leahy Scale: Good     Standing balance support: Bilateral upper extremity supported;No upper extremity supported;During functional activity Standing balance-Leahy Scale: Fair Standing balance comment: able to perform grooming tasks standing at sink                           ADL either performed or assessed with clinical judgement   ADL Overall ADL's : Needs assistance/impaired     Grooming: Supervision/safety;Wash/dry hands;Wash/dry face;Standing Grooming Details (indicate cue type and reason): At sink                 Toilet Transfer: Supervision/safety;Ambulation;Regular Toilet;Rolling walker (2 wheels) Toilet Transfer Details (indicate cue type and reason): performed x3 Toileting- Clothing Manipulation and Hygiene: Independent;Sitting/lateral lean       Functional mobility during ADLs: Supervision/safety;Rolling walker (2 wheels) General ADL Comments: asked to go to toilet x3 with patient voiding only once    Extremity/Trunk Assessment              Vision       Perception     Praxis      Cognition Arousal/Alertness: Awake/alert Behavior During Therapy: Flat affect Overall Cognitive Status: Within Functional Limits for tasks assessed  General Comments: mumbled speech          Exercises     Shoulder Instructions       General Comments      Pertinent Vitals/ Pain       Pain Assessment: Faces Faces Pain Scale: Hurts a little bit Pain Location: abdomen Pain Descriptors / Indicators: Discomfort Pain Intervention(s): Monitored during session  Home Living                                           Prior Functioning/Environment              Frequency  Min 2X/week        Progress Toward Goals  OT Goals(current goals can now be found in the care plan section)  Progress towards OT goals: Progressing toward goals  Acute Rehab OT Goals Patient Stated Goal: get better OT Goal Formulation: With patient Time For Goal Achievement: 08/19/21 Potential to Achieve Goals: Good ADL Goals Pt Will Perform Grooming: with modified independence;standing Pt Will Perform Lower Body Bathing: with modified independence Pt Will Perform Lower Body Dressing: with modified independence;sit to/from stand Pt Will Transfer to Toilet: with modified independence;ambulating;regular height toilet Pt Will Perform Tub/Shower Transfer: Tub transfer;with modified independence;ambulating;3 in 1  Plan Discharge plan remains appropriate;Frequency remains appropriate    Co-evaluation                 AM-PAC OT "6 Clicks" Daily Activity     Outcome Measure   Help from another person eating meals?: None Help from another person taking care of personal grooming?: A Little Help from another person toileting, which includes using toliet, bedpan, or urinal?: A Little Help from another person bathing (including washing, rinsing, drying)?: A Little Help from another person to put on and taking off regular upper body clothing?: None Help from another person to put on and taking off regular lower body clothing?: A Little 6 Click Score: 20    End of Session Equipment Utilized During Treatment: Rolling walker (2 wheels)  OT Visit Diagnosis: Unsteadiness on feet (R26.81);History of falling (Z91.81);Pain   Activity Tolerance Patient tolerated treatment well   Patient Left in chair;with call bell/phone within reach;with chair alarm set   Nurse Communication Mobility status;Other (comment) (and multiple trips to bathroom)        Time: 0272-5366 OT Time  Calculation (min): 28 min  Charges: OT General Charges $OT Visit: 1 Visit OT Treatments $Self Care/Home Management : 8-22 mins $Therapeutic Activity: 8-22 mins  Alfonse Flavors, OTA Acute Rehabilitation Services  Pager [phone] Office [phone]   Dewain Penning 08/18/2021, 9:19 AM

## 2021-08-18 NOTE — Progress Notes (Signed)
Patient is very unmotivated. He refuses to sit in the chair for more than one hour. He ambulates with standby assist and rolling walker.

## 2021-08-18 NOTE — Progress Notes (Signed)
PT Cancellation Note  Patient Details Name: [name] MRN: [id number] DOB: 1951/04/16   Cancelled Treatment:    Reason Eval/Treat Not Completed: Patient declined, no reason specified (Pt. is on phone when PT arrives and refuses therapy.  States he walked earlier with someone else.  Pt. appears to often refuses therapy or only do therex, may need to be D/C from caseload.)  Robby Bulkley A. Asani Deniston, PT, DPT Acute Rehabilitation Services Office: [phone]  Kayani Rapaport A Makale Pindell 08/18/2021, 10:42 AM

## 2021-08-18 NOTE — Plan of Care (Signed)
  Problem: Clinical Measurements: Goal: Ability to maintain clinical measurements within normal limits will improve Outcome: Progressing   

## 2021-08-18 NOTE — Progress Notes (Signed)
Mobility Specialist Progress Note:   08/18/21 1300  Mobility  Activity Ambulated to bathroom  Level of Assistance Minimal assist, patient does 75% or more  Assistive Device Front wheel walker  Distance Ambulated (ft) 20 ft  Mobility Out of bed for toileting  Mobility Response Tolerated well  Mobility performed by Mobility specialist  $Mobility charge 1 Mobility   Pt in poor spirits today. Pt complained of 10/10 abdominal pain. Ambulated to BR, required minA to correct x1 LOB. Otherwise slow, steady gait.  Addison Lank Mobility Specialist  Phone [phone]

## 2021-08-18 NOTE — Progress Notes (Addendum)
Initial Nutrition Assessment  DOCUMENTATION CODES:   Severe malnutrition in context of chronic illness  INTERVENTION:   Decrease Ensure Enlive po BID, each supplement provides 350 kcal and 20 grams of protein  Recommend downgrading pt to Dys 2 diet, to improve PO intake. Messaged surgery PA, received ok. Encourage good PO intake  Continue TPN per Pharmacy Recommend continuing TPN at goal until pt demonstrates ability to eat 60% of needs via PO.  NUTRITION DIAGNOSIS:   Severe Malnutrition related to chronic illness (CHF) as evidenced by severe muscle depletion, severe fat depletion. - Ongoing  GOAL:   Patient will meet greater than or equal to 90% of their needs - Met via TPN  MONITOR:   PO intake, Supplement acceptance, Weight trends, Labs  REASON FOR ASSESSMENT:   NPO/Clear Liquid Diet    ASSESSMENT:   70 y.o. male presented to the ED with lower abdominal pain. PMH includes CHF, GERD, and HTN. Pt admitted with enteritis and an ileus; Flu+ on admission.   11/03: NPO; CLD 11/04: NPO 11/06: NG tube placed for LIS 11/09 - s/p ex-lap with lysis of adhesions 11/11 - TPN start 11/12 - TPN increased to goal rate 11/13 - NG tube clamped, later removed 11/14 - clear liquids, full liquids 11/17 - full liquids, SOFT, full liquids 11/18 - SOFT, full liquids, SOFT  Pt reports that he does not have his dentures and that he cannot eat well without them. Discussed with pt that we can chop meats and sides for the patient that may help with chewing until discharge; pt agreeable to texture downgrade. Pt states that he had some soup this morning and is drinking plenty. Pt reports that his aunt and wife have come to visit but they cannot bring his dentures.   Discussed importance of getting enough nutrition to promote healing and prevent lean muscle mass loss. Pt expressed understanding.   Pt states that he thinks he has been drinking only 1 Ensure or Boost Breeze per day. PLDN  discussed having a goal to drink 2 Ensure per day, pt agreeable to try for goal.   Per Pharmacy note, decreasing TPN to 75%; PLDN recommend continuing TPN at goal until pt demonstrates the ability to consume at least 60% via PO.  Medications reviewed and include: Reglan Labs reviewed: Sodium 134, BUN 35  Admission Weight: 81.6 kg  Current Weight: 69.8 kg (suspect weight loss related to fluid loss)  Diet Order:   Diet Order             DIET SOFT Room service appropriate? Yes; Fluid consistency: Thin  Diet effective now                   EDUCATION NEEDS:   No education needs have been identified at this time  Skin:  Skin Assessment: Skin Integrity Issues: Skin Integrity Issues:: Incisions Incisions: Abdomen  Last BM:  08/14/2021  Height:   Ht Readings from Last 1 Encounters:  08/11/21 '6\' 1"'  (1.854 m)    Weight:   Wt Readings from Last 1 Encounters:  08/18/21 69.8 kg    Ideal Body Weight:  83.6 kg  BMI:  Body mass index is 20.3 kg/m.  Estimated Nutritional Needs:   Kcal:  2300-2500  Protein:  115-130 grams  Fluid:  > 2 L    Jalisia Puchalski BS, PLDN Clinical Dietitian See AMiON for contact information.

## 2021-08-19 ENCOUNTER — Inpatient Hospital Stay (HOSPITAL_COMMUNITY): Payer: Medicare HMO

## 2021-08-19 DIAGNOSIS — N179 Acute kidney failure, unspecified: Secondary | ICD-10-CM | POA: Diagnosis not present

## 2021-08-19 DIAGNOSIS — K9189 Other postprocedural complications and disorders of digestive system: Secondary | ICD-10-CM | POA: Diagnosis not present

## 2021-08-19 DIAGNOSIS — I5022 Chronic systolic (congestive) heart failure: Secondary | ICD-10-CM | POA: Diagnosis not present

## 2021-08-19 DIAGNOSIS — K56609 Unspecified intestinal obstruction, unspecified as to partial versus complete obstruction: Secondary | ICD-10-CM | POA: Diagnosis not present

## 2021-08-19 LAB — GLUCOSE, CAPILLARY
Glucose-Capillary: 109 mg/dL — ABNORMAL HIGH (ref 70–99)
Glucose-Capillary: 116 mg/dL — ABNORMAL HIGH (ref 70–99)
Glucose-Capillary: 124 mg/dL — ABNORMAL HIGH (ref 70–99)
Glucose-Capillary: 158 mg/dL — ABNORMAL HIGH (ref 70–99)
Glucose-Capillary: 181 mg/dL — ABNORMAL HIGH (ref 70–99)
Glucose-Capillary: 193 mg/dL — ABNORMAL HIGH (ref 70–99)
Glucose-Capillary: 200 mg/dL — ABNORMAL HIGH (ref 70–99)

## 2021-08-19 LAB — BASIC METABOLIC PANEL
Anion gap: 8 (ref 5–15)
BUN: 31 mg/dL — ABNORMAL HIGH (ref 8–23)
CO2: 27 mmol/L (ref 22–32)
Calcium: 9.2 mg/dL (ref 8.9–10.3)
Chloride: 103 mmol/L (ref 98–111)
Creatinine, Ser: 0.72 mg/dL (ref 0.61–1.24)
GFR, Estimated: >60 mL/min (ref >60)
Glucose, Bld: 194 mg/dL — ABNORMAL HIGH (ref 70–99)
Potassium: 3.9 mmol/L (ref 3.5–5.1)
Sodium: 138 mmol/L (ref 135–145)

## 2021-08-19 LAB — MAGNESIUM: Magnesium: 2.2 mg/dL (ref 1.7–2.4)

## 2021-08-19 LAB — PREALBUMIN: Prealbumin: 28.6 mg/dL (ref 18–38)

## 2021-08-19 LAB — PHOSPHORUS
Phosphorus: 3.3 mg/dL (ref 2.5–4.6)
Phosphorus: 3.9 mg/dL (ref 2.5–4.6)

## 2021-08-19 MED ORDER — METHOCARBAMOL 1000 MG/10ML IJ SOLN
1000.0000 mg | Freq: Four times a day (QID) | INTRAVENOUS | Status: DC | PRN
  Filled 2021-08-19 (×2): qty 10

## 2021-08-19 MED ORDER — TRAVASOL 10 % IV SOLN
INTRAVENOUS | Status: AC
  Filled 2021-08-19: qty 1026

## 2021-08-19 MED ORDER — LACTATED RINGERS IV BOLUS: 1000.0000 mL | Freq: Three times a day (TID) | INTRAVENOUS | Status: AC | PRN

## 2021-08-19 MED ORDER — SODIUM CHLORIDE 0.9% FLUSH
3.0000 mL | Freq: Two times a day (BID) | INTRAVENOUS | Status: DC
  Administered 2021-08-19 – 2021-09-01 (×10): 3 mL via INTRAVENOUS

## 2021-08-19 MED ORDER — SODIUM CHLORIDE 0.9% FLUSH: 3.0000 mL | INTRAVENOUS | Status: DC | PRN

## 2021-08-19 MED ORDER — METOCLOPRAMIDE HCL 5 MG/ML IJ SOLN
5.0000 mg | Freq: Once | INTRAMUSCULAR | Status: AC
  Administered 2021-08-19: 5 mg via INTRAVENOUS
  Filled 2021-08-19: qty 2

## 2021-08-19 MED ORDER — METHOCARBAMOL 1000 MG/10ML IJ SOLN: 1000.0000 mg | Freq: Four times a day (QID) | INTRAVENOUS | Status: DC | PRN

## 2021-08-19 MED ORDER — SODIUM CHLORIDE 0.9 % IV SOLN: 250.0000 mL | INTRAVENOUS | Status: DC | PRN

## 2021-08-19 MED ORDER — BISACODYL 10 MG RE SUPP
10.0000 mg | Freq: Every day | RECTAL | Status: DC
  Administered 2021-08-23 – 2021-09-04 (×10): 10 mg via RECTAL
  Filled 2021-08-19 (×13): qty 1

## 2021-08-19 MED ORDER — METHOCARBAMOL 500 MG PO TABS: 1000.0000 mg | ORAL_TABLET | Freq: Four times a day (QID) | ORAL | Status: DC | PRN

## 2021-08-19 MED ORDER — SODIUM CHLORIDE 0.9 % IV SOLN
12.5000 mg | Freq: Once | INTRAVENOUS | Status: AC
  Administered 2021-08-19: 12.5 mg via INTRAVENOUS
  Filled 2021-08-19: qty 0.5

## 2021-08-19 MED ORDER — CALCIUM POLYCARBOPHIL 625 MG PO TABS
625.0000 mg | ORAL_TABLET | Freq: Two times a day (BID) | ORAL | Status: DC
  Filled 2021-08-19 (×3): qty 1

## 2021-08-19 MED ORDER — METOCLOPRAMIDE HCL 5 MG PO TABS
5.0000 mg | ORAL_TABLET | Freq: Three times a day (TID) | ORAL | Status: DC
  Administered 2021-08-20: 5 mg via ORAL
  Filled 2021-08-19: qty 1

## 2021-08-19 MED ORDER — PROCHLORPERAZINE EDISYLATE 10 MG/2ML IJ SOLN
5.0000 mg | INTRAMUSCULAR | Status: DC | PRN
Start: 2021-08-19 — End: 2021-09-08

## 2021-08-19 NOTE — Progress Notes (Signed)
Mobility Specialist Progress Note:   08/19/21 1140  Mobility  Activity Ambulated to bathroom;Dangled on edge of bed  Level of Assistance Contact guard assist, steadying assist  Assistive Device Front wheel walker  Distance Ambulated (ft) 20 ft  Mobility Out of bed for toileting  Mobility Response Tolerated fair  Mobility performed by Mobility specialist  $Mobility charge 1 Mobility   Pt spitting up upon arrival. Agreed to sit EOB to see if it improved. Pt then requested to go to BR. Required contactG during amb for safety. Returned to bed, spitting up/hiccupping seemed to subside.   Addison Lank Mobility Specialist  Phone [phone]

## 2021-08-19 NOTE — Progress Notes (Signed)
NG tube placed to patient's R nare at this time.  Patient hesitant but allowed this nurse to attempt.  He tolerated procedure well.  Immediate return of green bilious liquid.  Stat abd xray ordered.

## 2021-08-19 NOTE — Progress Notes (Signed)
PHARMACY - TOTAL PARENTERAL NUTRITION CONSULT NOTE  Indication: Small bowel obstruction  Patient Measurements: Height: 6\' 1"  (185.4 cm) Weight: 69.6 kg (153 lb 7 oz) IBW/kg (Calculated) : 79.9 TPN AdjBW (KG): 70 Body mass index is 20.24 kg/m. Usual Weight: 74 kg  Assessment:  70 y.o. male with PMH significant for HTN, CHF with LVEF ~25%, GERD, BPH, prior history of alcoholism who presented with complaints of lower abdominal pain, found to have SBO.  Patient has remained NPO/CLD, now for more than 7 days and at risk of becoming severely malnourished. Pharmacy is consulted to manage TPN.   Per RD's conversation with patient, reported he was eating well PTA. States he would have abdominal pain when eating. Pt reports typical intake of breakfast: eggs, Lunch: sandwich, Dinner: fried chicken, rice, and broccoli. Per patient 11/15, he sometimes skips meals at home even though his wife is a good cook because he just doesn't feel like eating.  Glucose / Insulin: no hx DM - CBGs 140-200s on TPN, 90-120s off TPN Electrolytes: all WNL Renal: SCr < 1, BUN down to 31 Hepatic: LFTs / Tbili / TG WNL, albumin 2.8 Intake / Output; MIVF: UOP 0.1 ml/kg/hr, BM x8, emesis x3 GI Imaging:  11/3 CT abd: SBO  11/4 Abd XR: small partial SBO 11/6 Abd XR: Persistent SBO  11/7 Abd XR: Persistent SBO  11/8 Abd XR: SBO with interval worsening 11/9 Abd XR: Persistent small bowel obstruction GI Surgeries / Procedures:  11/9 ex-lap with LOA   Central access: PICC placed 08/10/21 TPN start date:  08/11/21  Nutritional Goals: Goal continuous TPN rate is 90 mL/hr (provides 118 g of protein and 2334 kcals per day)  RD Assessment: Estimated Needs Total Energy Estimated Needs: 2300-2500 Total Protein Estimated Needs: 115-130 grams Total Fluid Estimated Needs: > 2 L  Current Nutrition:  TPN Ensure Enlive TID - 2 charted given on 11/18 Dysphagia diet - eat 50% of meals per patient  Plan:   Continue TPN at  75% to help with appetite given that patient has signs of improved SBO but continues to have poor PO intake. Discussed with Surgery 11/18.  Tweak TPN to provide volume for electrolyte adjustments Cycle TPN over 12 hours (rate 82-164 ml/hr, GIR 2.6-5.3 mg/kg/min) - provides 103g AA, 243g CHO, 52g ILE and 1759 kCal Electrolytes in TPN: Na 110mEq/L, K up to 5mEq/L, add back Mg 24mEq/L, Ca 5 mEq/L, Phos up to 26mmol/L; Cl:Ac 1:1 Remove MVI and trace elements from TPN, ordered as PO tablet  Monitor TPN labs on Mon/Thurs, AM labs F/u PO intake and ability to wean off of TPN vs increase back to goal if continued emesis  Gwenivere Hiraldo D. 11-07-1981, PharmD, BCPS, BCCCP 08/19/2021, 10:12 AM

## 2021-08-19 NOTE — Progress Notes (Signed)
[name] [id number] Jun 02, 1951  CARE TEAM:  PCP: Sharon Seller, NP  Outpatient Care Team: Patient Care Team: Sharon Seller, NP as PCP - General (Geriatric Medicine) Pricilla Riffle, MD as PCP - Cardiology (Cardiology)  Inpatient Treatment Team: Treatment Team: Attending Provider: David Stall, Darin Engels, MD; Consulting Physician: Montez Morita, Md, MD; Rounding Team: Mahala Menghini, MD; Utilization Review: Deveron Furlong, RN; Case Manager: Bess Kinds, RN; Licensed Practical Nurse: Pernell Dupre, LPN; Mobility Specialist: Karolee Ohs; Registered Nurse: Eloisa Northern, RN; Social Worker: Levada Schilling   Problem List:   Principal Problem:   SBO (small bowel obstruction) [facility]) Active Problems:   Chronic systolic CHF (congestive heart failure) (HCC)   Protein-calorie malnutrition, severe   Aortic atherosclerosis (HCC)   10 Days Post-Op  08/09/2021  POST-OPERATIVE DIAGNOSIS:  Small bowel obstruction   PROCEDURE:   EXPLORATORY LAPAROTOMY LYSIS OF ADHESIONS   SURGEON:  Surgeon(s): Violeta Gelinas, MD    Assessment  Stabilizing with fair recovery  [facility] Stay = 15 days)  Assessment/Plan  SBO POD 10 s/p ex lap LOA by Dr. Janee Morn 11/9 with findings of dense adhesion near distal ileum down to the mesentery -Prolonged ileus in the setting of chronic obstruction optically surprising but slowly resolving- NGT out 11/13 - afebrile on scheduled tylenol - abd xray 11/16 with Mild dilation of small bowel consistent with a postsurgical adynamic ileus, but with partial small bowel obstruction not excluded.  Check x-ray in AM if not better but numerous bowel movements encouraging some -Fiber bowel regimen -Transition to oral metoclopramide - encouraged ambulation and incentive spirometry use.  - multimodal pain control - PICC/TNA - monitor PO intake.  Wean TPN as tolerated    Advance to soft diet for lunch and work on weaning TPN. Hopefully  discharge soon as PO intake improves   FEN -advance to mechanically softened/dysphagia 3 diet.  Try and get off IV fluid.  Bolus backup ID - ancef periop VTE - SCDs, lovenox Foley - placed periop 11/9, d/c 11/10   AKI HTN HLD COPD CHF          20 minutes spent in review, evaluation, examination, counseling, and coordination of care.   I have reviewed this patient's available data, including medical history, events of note, physical examination and test results as part of my evaluation.  A significant portion of that time was spent in counseling.  Care during the described time interval was provided by me.  08/19/2021    Subjective: (Chief complaint)  Patient resting in bed.  Denies much pain.  Not wishing to be at interactive  Objective:  Vital signs:  Vitals:   08/18/21 1709 08/18/21 1944 08/19/21 0356 08/19/21 0846  BP: 133/82 129/84 (!) 124/91 118/83  Pulse: 98 98 (!) 110 (!) 110  Resp: 18 19 20 17   Temp: 98.3 F (36.8 C) 98.6 F (37 C) (!) 97.5 F (36.4 C) 98.4 F (36.9 C)  TempSrc: Oral Oral Oral Oral  SpO2: 100% 98% 100% 99%  Weight:   69.6 kg   Height:        Last BM Date: 08/18/21  Intake/Output   Yesterday:  11/18 0701 - 11/19 0700 In: 1580.7 [P.O.:460; I.V.:1120.7] Out: 200 [Urine:200] This shift:  No intake/output data recorded.  Bowel function:  Flatus: YES  BM:  YES  Drain: (No drain)   Physical Exam:  General: Pt awake/alert in no acute distress Eyes: PERRL, normal EOM.  Sclera clear.  No icterus Neuro: CN II-XII intact w/o focal sensory/motor deficits. Lymph: No head/neck/groin lymphadenopathy Psych:  No delerium/psychosis/paranoia.  Oriented x 4 HENT: Normocephalic, Mucus membranes moist.  No thrush Neck: Supple, No tracheal deviation.  No obvious thyromegaly Chest: No pain to chest wall compression.  Good respiratory excursion.  No audible wheezing CV:  Pulses intact.  Regular rhythm.  No major extremity edema MS:  Normal AROM mjr joints.  No obvious deformity  Abdomen: Soft.  Moderately distended.  Nontender.  Staples intact on incision without cellulitis nor dehiscence no evidence of peritonitis.  No incarcerated hernias.  Ext:   No deformity.  No mjr edema.  No cyanosis Skin: No petechiae / purpurea.  No major sores.  Warm and dry    Results:   Cultures: Recent Results (from the past 720 hour(s))  Resp Panel by RT-PCR (Flu A&B, Covid) Nasopharyngeal Swab     Status: Abnormal   Collection Time: 08/03/21 12:31 PM   Specimen: Nasopharyngeal Swab; Nasopharyngeal(NP) swabs in vial transport medium  Result Value Ref Range Status   SARS Coronavirus 2 by RT PCR NEGATIVE NEGATIVE Final    Comment: (NOTE) SARS-CoV-2 target nucleic acids are NOT DETECTED.  The SARS-CoV-2 RNA is generally detectable in upper respiratory specimens during the acute phase of infection. The lowest concentration of SARS-CoV-2 viral copies this assay can detect is 138 copies/mL. A negative result does not preclude SARS-Cov-2 infection and should not be used as the sole basis for treatment or other patient management decisions. A negative result may occur with  improper specimen collection/handling, submission of specimen other than nasopharyngeal swab, presence of viral mutation(s) within the areas targeted by this assay, and inadequate number of viral copies(<138 copies/mL). A negative result must be combined with clinical observations, patient history, and epidemiological information. The expected result is Negative.  Fact Sheet for Patients:  EntrepreneurPulse.com.au  Fact Sheet for Healthcare Providers:  IncredibleEmployment.be  This test is no t yet approved or cleared by the [place] FDA and  has been authorized for detection and/or diagnosis of SARS-CoV-2 by FDA under an Emergency Use Authorization (EUA). This EUA will remain  in effect (meaning this test can be used)  for the duration of the COVID-19 declaration under Section 564(b)(1) of the Act, 21 U.S.C.section 360bbb-3(b)(1), unless the authorization is terminated  or revoked sooner.       Influenza A by PCR POSITIVE (A) NEGATIVE Final   Influenza B by PCR NEGATIVE NEGATIVE Final    Comment: (NOTE) The Xpert Xpress SARS-CoV-2/FLU/RSV plus assay is intended as an aid in the diagnosis of influenza from Nasopharyngeal swab specimens and should not be used as a sole basis for treatment. Nasal washings and aspirates are unacceptable for Xpert Xpress SARS-CoV-2/FLU/RSV testing.  Fact Sheet for Patients: EntrepreneurPulse.com.au  Fact Sheet for Healthcare Providers: IncredibleEmployment.be  This test is not yet approved or cleared by the [place] FDA and has been authorized for detection and/or diagnosis of SARS-CoV-2 by FDA under an Emergency Use Authorization (EUA). This EUA will remain in effect (meaning this test can be used) for the duration of the COVID-19 declaration under Section 564(b)(1) of the Act, 21 U.S.C. section 360bbb-3(b)(1), unless the authorization is terminated or revoked.  Performed at [facility], [place] [address] [place], [place] [address]     Labs: Results for orders placed or performed during the hospital encounter of 08/03/21 (from the past 48 hour(s))  Glucose, capillary     Status: None   Collection  Time: 08/17/21 12:17 PM  Result Value Ref Range   Glucose-Capillary 98 70 - 99 mg/dL    Comment: Glucose reference range applies only to samples taken after fasting for at least 8 hours.  Glucose, capillary     Status: None   Collection Time: 08/17/21  5:21 PM  Result Value Ref Range   Glucose-Capillary 87 70 - 99 mg/dL    Comment: Glucose reference range applies only to samples taken after fasting for at least 8 hours.  Glucose, capillary     Status: Abnormal   Collection Time: 08/17/21  8:25 PM  Result Value Ref  Range   Glucose-Capillary 191 (H) 70 - 99 mg/dL    Comment: Glucose reference range applies only to samples taken after fasting for at least 8 hours.  Glucose, capillary     Status: Abnormal   Collection Time: 08/17/21 11:52 PM  Result Value Ref Range   Glucose-Capillary 166 (H) 70 - 99 mg/dL    Comment: Glucose reference range applies only to samples taken after fasting for at least 8 hours.  Glucose, capillary     Status: Abnormal   Collection Time: 08/18/21  3:56 AM  Result Value Ref Range   Glucose-Capillary 146 (H) 70 - 99 mg/dL    Comment: Glucose reference range applies only to samples taken after fasting for at least 8 hours.  Glucose, capillary     Status: Abnormal   Collection Time: 08/18/21  8:27 AM  Result Value Ref Range   Glucose-Capillary 105 (H) 70 - 99 mg/dL    Comment: Glucose reference range applies only to samples taken after fasting for at least 8 hours.  Glucose, capillary     Status: None   Collection Time: 08/18/21 12:10 PM  Result Value Ref Range   Glucose-Capillary 93 70 - 99 mg/dL    Comment: Glucose reference range applies only to samples taken after fasting for at least 8 hours.  Glucose, capillary     Status: None   Collection Time: 08/18/21  5:11 PM  Result Value Ref Range   Glucose-Capillary 98 70 - 99 mg/dL    Comment: Glucose reference range applies only to samples taken after fasting for at least 8 hours.  Glucose, capillary     Status: Abnormal   Collection Time: 08/18/21  7:54 PM  Result Value Ref Range   Glucose-Capillary 148 (H) 70 - 99 mg/dL    Comment: Glucose reference range applies only to samples taken after fasting for at least 8 hours.  Glucose, capillary     Status: Abnormal   Collection Time: 08/19/21 12:16 AM  Result Value Ref Range   Glucose-Capillary 200 (H) 70 - 99 mg/dL    Comment: Glucose reference range applies only to samples taken after fasting for at least 8 hours.  Basic metabolic panel     Status: Abnormal   Collection  Time: 08/19/21 12:54 AM  Result Value Ref Range   Sodium 138 135 - 145 mmol/L   Potassium 3.9 3.5 - 5.1 mmol/L   Chloride 103 98 - 111 mmol/L   CO2 27 22 - 32 mmol/L   Glucose, Bld 194 (H) 70 - 99 mg/dL    Comment: Glucose reference range applies only to samples taken after fasting for at least 8 hours.   BUN 31 (H) 8 - 23 mg/dL   Creatinine, Ser 0.72 0.61 - 1.24 mg/dL   Calcium 9.2 8.9 - 10.3 mg/dL   GFR, Estimated >60 >60 mL/min  Comment: (NOTE) Calculated using the CKD-EPI Creatinine Equation (2021)    Anion gap 8 5 - 15    Comment: Performed at [facility], [place] [address] [place], [place] [address]  Magnesium     Status: None   Collection Time: 08/19/21 12:54 AM  Result Value Ref Range   Magnesium 2.2 1.7 - 2.4 mg/dL    Comment: Performed at [facility] [address] [place], [place] [address]  Phosphorus     Status: None   Collection Time: 08/19/21 12:54 AM  Result Value Ref Range   Phosphorus 3.3 2.5 - 4.6 mg/dL    Comment: Performed at [facility] [address] [place], [place] [address]  Glucose, capillary     Status: Abnormal   Collection Time: 08/19/21  4:10 AM  Result Value Ref Range   Glucose-Capillary 193 (H) 70 - 99 mg/dL    Comment: Glucose reference range applies only to samples taken after fasting for at least 8 hours.  Glucose, capillary     Status: Abnormal   Collection Time: 08/19/21  8:47 AM  Result Value Ref Range   Glucose-Capillary 124 (H) 70 - 99 mg/dL    Comment: Glucose reference range applies only to samples taken after fasting for at least 8 hours.    Imaging / Studies: No results found.  Medications / Allergies: per chart  Antibiotics: Anti-infectives (From admission, onward)    Start     Dose/Rate Route Frequency Ordered Stop   08/09/21 1000  ceFAZolin (ANCEF) IVPB 2g/100 mL premix  Status:  Discontinued        2 g 200 mL/hr over 30 Minutes Intravenous To Short Stay 08/09/21 0758 08/10/21 1000   08/04/21  2200  oseltamivir (TAMIFLU) capsule 30 mg        30 mg Oral 2 times daily 08/04/21 0822 08/09/21 0959   08/04/21 1000  oseltamivir (TAMIFLU) capsule 75 mg        75 mg Oral  Once 08/04/21 0825 08/04/21 1116   08/03/21 1645  cefTRIAXone (ROCEPHIN) 2 g in sodium chloride 0.9 % 100 mL IVPB  Status:  Discontinued        2 g 200 mL/hr over 30 Minutes Intravenous Every 24 hours 08/03/21 1631 08/06/21 1519   08/03/21 1645  metroNIDAZOLE (FLAGYL) IVPB 500 mg  Status:  Discontinued        500 mg 100 mL/hr over 60 Minutes Intravenous Every 12 hours 08/03/21 1631 08/06/21 1519         Note: Portions of this report may have been transcribed using voice recognition software. Every effort was made to ensure accuracy; however, inadvertent computerized transcription errors may be present.   Any transcriptional errors that result from this process are unintentional.    Adin Hector, MD, FACS, MASCRS Esophageal, Gastrointestinal & Colorectal Surgery Robotic and Minimally Invasive Surgery  [facility], [place]  [place]. [address] [place], [place] [address] [phone] Fax [phone] Main  CONTACT INFORMATION:  Weekday (9AM-5PM): Call [facility] main office at [phone]  Weeknight (5PM-9AM) or Weekend/Holiday: Check [email or web address] (password " [email or web address]") for General Surgery [facility] coverage  (Please, do not use SecureChat as it is not reliable communication to operating surgeons for immediate patient care)      08/19/2021  9:13 AM

## 2021-08-19 NOTE — Progress Notes (Signed)
[facility] PROGRESS NOTE    Progress Note  [name]  [id number] DOB: [birth year] DOA: 08/03/2021 PCP: Lauree Chandler, NP    Brief Narrative:   [name] is an 70 y.o. male essential hypertension chronic systolic heart failure with an EF of 20%, prior history of alcoholism comes in for abdominal pain suspected enteritis and ileitis seen by CT 3 general surgery.  Failed conservative management, he subsequently developed small bowel obstruction underwent exploratory laparotomy on 08/09/2021 which revealed entrapped by dense adhesions small bowel which were lysed, started on TPN.  Bowel function returning on 11/14, diet advanced a G-tube removed has been weaned off TPN.  Assessment/Plan:   SBO (small bowel obstruction) ([place]): Status post exploratory laparotomy has been weaned NG tube. Currently on TNA,  Alleviating about 20% of his diet.  He is to be some lack of motivation. Has remained afebrile, abdominal exam is benign.  Did not work with physical therapy yesterday.  The patient had a bed to chair as much as possible. Also not ambulating as much as we would want. Out of bed to chair encourage ambulation is much as possible. Surgery managing. Has not had a bowel movement will defer management to surgery.  Severe protein caloric malnutrition:  Ensure 3 times daily as tolerated.  Chronic systolic heart failure: Appears euvolemic with an EF of 20%. Hold ACE inhibitor blood pressure is borderline.  Aortic sclerosis: Continue statin as discharged.  Leukopenia: Now resolved.  Influenza A with a positive PCR 08/13/2021: Complete his course of Tamiflu.  Acute kidney injury: Likely secondary to small bowel obstruction in the setting of ACE inhibitor use resolved with IV fluid hydration.   DVT prophylaxis: lovenox Family Communication:none Status is: Inpatient  Remains inpatient appropriate because: Acute small bowel obstruction      Code Status:      Code Status Orders  (From admission, onward)           Start     Ordered   08/03/21 1527  Full code  Continuous        08/03/21 1530           Code Status History     Date Active Date Inactive Code Status Order ID Comments User Context   05/16/2021 1719 05/17/2021 1656 Full Code [id number]  Robley Fries, MD Inpatient   03/04/2016 0058 03/05/2016 1657 Full Code [id number]  Orlie Dakin, MD ED         IV Access:   Peripheral IV   Procedures and diagnostic studies:   No results found.   Medical Consultants:   None.   Subjective:    [name] poor appetite he relates he is not hungry.  Objective:    Vitals:   08/18/21 1709 08/18/21 1944 08/19/21 0356 08/19/21 0846  BP: 133/82 129/84 (!) 124/91 118/83  Pulse: 98 98 (!) 110 (!) 110  Resp: 18 19 20 17   Temp: 98.3 F (36.8 C) 98.6 F (37 C) (!) 97.5 F (36.4 C) 98.4 F (36.9 C)  TempSrc: Oral Oral Oral Oral  SpO2: 100% 98% 100% 99%  Weight:   69.6 kg   Height:       SpO2: 99 % O2 Flow Rate (L/min): 1 L/min   Intake/Output Summary (Last 24 hours) at 08/19/2021 0852 Last data filed at 08/19/2021 0200 Gross per 24 hour  Intake 1580.74 ml  Output 200 ml  Net 1380.74 ml    Filed Weights   08/17/21 0332 08/18/21  0258 08/19/21 0356  Weight: 69.9 kg 69.8 kg 69.6 kg    Exam: General exam: In no acute distress. Respiratory system: Good air movement and clear to auscultation. Cardiovascular system: S1 & S2 heard, RRR. No JVD. Gastrointestinal system: Abdomen is nondistended, soft and nontender.  Extremities: No pedal edema. Skin: No rashes, lesions or ulcers Psychiatry: Judgement and insight appear normal. Mood & affect appropriate.   Data Reviewed:    Labs: Basic Metabolic Panel: Recent Labs  Lab 08/13/21 0323 08/14/21 0336 08/16/21 0309 08/17/21 0436 08/19/21 0054  NA 135 134* 136 134* 138  K 4.1 4.1 5.1 4.7 3.9  CL 101 102 101 103 103  CO2 27 26 27 24 27   GLUCOSE  96 131* 132* 164* 194*  BUN 14 16 23  35* 31*  CREATININE 0.63 0.58* 0.68 0.67 0.72  CALCIUM 8.6* 8.9 9.0 8.9 9.2  MG 2.2 2.1 2.3 2.4 2.2  PHOS 3.6 3.8 3.4 4.3 3.3    GFR Estimated Creatinine Clearance: 85.8 mL/min (by C-G formula based on SCr of 0.72 mg/dL). Liver Function Tests: Recent Labs  Lab 08/14/21 0336 08/17/21 0436  AST 35 41  ALT 31 65*  ALKPHOS 31* 57  BILITOT 0.3 0.4  PROT 6.1* 6.0*  ALBUMIN 2.8* 2.8*    No results for input(s): LIPASE, AMYLASE in the last 168 hours. No results for input(s): AMMONIA in the last 168 hours. Coagulation profile No results for input(s): INR, PROTIME in the last 168 hours. COVID-19 Labs  No results for input(s): DDIMER, FERRITIN, LDH, CRP in the last 72 hours.  Lab Results  Component Value Date   SARSCOV2NAA NEGATIVE 08/03/2021   SARSCOV2NAA RESULT: NEGATIVE 05/12/2021    CBC: Recent Labs  Lab 08/16/21 0810  WBC 4.9  HGB 11.3*  HCT 36.1*  MCV 113.5*  PLT 383    Cardiac Enzymes: No results for input(s): CKTOTAL, CKMB, CKMBINDEX, TROPONINI in the last 168 hours. BNP (last 3 results) No results for input(s): PROBNP in the last 8760 hours. CBG: Recent Labs  Lab 08/18/21 1711 08/18/21 1954 08/19/21 0016 08/19/21 0410 08/19/21 0847  GLUCAP 98 148* 200* 193* 124*    D-Dimer: No results for input(s): DDIMER in the last 72 hours. Hgb A1c: No results for input(s): HGBA1C in the last 72 hours. Lipid Profile: No results for input(s): CHOL, HDL, LDLCALC, TRIG, CHOLHDL, LDLDIRECT in the last 72 hours.  Thyroid function studies: No results for input(s): TSH, T4TOTAL, T3FREE, THYROIDAB in the last 72 hours.  Invalid input(s): FREET3 Anemia work up: No results for input(s): VITAMINB12, FOLATE, FERRITIN, TIBC, IRON, RETICCTPCT in the last 72 hours. Sepsis Labs: Recent Labs  Lab 08/16/21 0810  WBC 4.9    Microbiology No results found for this or any previous visit (from the past 240 hour(s)).   Medications:     acetaminophen  1,000 mg Oral Q6H   aspirin EC  81 mg Oral Daily   Chlorhexidine Gluconate Cloth  6 each Topical Daily   enoxaparin (LOVENOX) injection  40 mg Subcutaneous Q24H   feeding supplement  237 mL Oral TID BM   influenza vaccine adjuvanted  0.5 mL Intramuscular Tomorrow-1000   lisinopril  5 mg Oral Daily   mouth rinse  15 mL Mouth Rinse BID   metoCLOPramide (REGLAN) injection  5 mg Intravenous Q12H   multivitamin with minerals  1 tablet Oral Daily   rosuvastatin  5 mg Oral Daily   sodium chloride flush  10-40 mL Intracatheter Q12H   Continuous Infusions:  LOS: 15 days   Charlynne Cousins  [facility]  08/19/2021, 8:52 AM

## 2021-08-19 NOTE — Progress Notes (Addendum)
Nutrition Follow-up  DOCUMENTATION CODES:   Severe malnutrition in context of chronic illness  INTERVENTION:  -Calorie count per MD (RD to follow-up on Monday, 11/21) -Continue Ensure Enlive po TID, each supplement provides 350 kcal and 20 grams of protein -Continue TPN per Pharmacy Recommend continuing until pt demonstrates ability to meet 60% of needs PO If emesis continues or if intake does not improve, recommend returning to meeting 100% estimated needs  NUTRITION DIAGNOSIS:   Severe Malnutrition related to chronic illness (CHF) as evidenced by severe muscle depletion, severe fat depletion. - Ongoing  GOAL:   Patient will meet greater than or equal to 90% of their needs - addressing via TPN  MONITOR:   PO intake, Supplement acceptance, Weight trends, Labs  REASON FOR ASSESSMENT:   NPO/Clear Liquid Diet    ASSESSMENT:   70 y.o. male presented to the ED with lower abdominal pain. PMH includes CHF, GERD, and HTN. Pt admitted with enteritis and an ileus; Flu+ on admission.   11/03: NPO; CLD 11/04: NPO 11/06: NG tube placed for LIS 11/09 - s/p ex-lap with lysis of adhesions 11/11 - TPN start 11/12 - TPN increased to goal rate 11/13 - NG tube clamped, later removed 11/14 - clear liquids, full liquids 11/17 - full liquids, SOFT, full liquids 11/18 - SOFT, full liquids, SOFT, dysphagia 2/thins 11/19 - dysphagia 3/thins  Pt c/o poor appetite, and, per RN, has refused Ensure x2 today, though had previously been accepting. Per Pharmacy note, plan to continue to meet 75% estimated needs via TPN. RD consulted to initiate calorie count.   No PO intake documented since last RD assessment  Medications: dulcolax,  Reglan, MVI, fibercon Labs reviewed: BUN 31 (H) CBGs: 98-200  24 hours  UOP: + 8 unmeasured occurrences x24 hours Emesis: 3x unmeasured occurrences x24 hours Stool: 8x unmeasured occurrences x24 hours I/O: +10.8L since admit  Admission Weight: 81.6 kg   Current Weight: 69.6 kg (suspect weight loss related to fluid loss)  Diet Order:   Diet Order             DIET DYS 3 Room service appropriate? Yes; Fluid consistency: Thin  Diet effective now                   EDUCATION NEEDS:   No education needs have been identified at this time  Skin:  Skin Assessment: Skin Integrity Issues: Skin Integrity Issues:: Incisions Incisions: Abdomen  Last BM:  11/19 type 6  Height:   Ht Readings from Last 1 Encounters:  08/11/21 6\' 1"  (1.854 m)    Weight:   Wt Readings from Last 1 Encounters:  08/19/21 69.6 kg    Ideal Body Weight:  83.6 kg  BMI:  Body mass index is 20.24 kg/m.  Estimated Nutritional Needs:   Kcal:  2300-2500  Protein:  115-130 grams  Fluid:  > 2 L     Jahmeir Geisen A., MS, RD, LDN (she/her/hers) RD pager number and weekend/on-call pager number located in Amion.

## 2021-08-20 DIAGNOSIS — I5022 Chronic systolic (congestive) heart failure: Secondary | ICD-10-CM | POA: Diagnosis not present

## 2021-08-20 DIAGNOSIS — N179 Acute kidney failure, unspecified: Secondary | ICD-10-CM | POA: Diagnosis not present

## 2021-08-20 DIAGNOSIS — K56609 Unspecified intestinal obstruction, unspecified as to partial versus complete obstruction: Secondary | ICD-10-CM | POA: Diagnosis not present

## 2021-08-20 DIAGNOSIS — K9189 Other postprocedural complications and disorders of digestive system: Secondary | ICD-10-CM | POA: Diagnosis not present

## 2021-08-20 LAB — BASIC METABOLIC PANEL
Anion gap: 5 (ref 5–15)
BUN: 29 mg/dL — ABNORMAL HIGH (ref 8–23)
CO2: 29 mmol/L (ref 22–32)
Calcium: 8.7 mg/dL — ABNORMAL LOW (ref 8.9–10.3)
Chloride: 106 mmol/L (ref 98–111)
Creatinine, Ser: 0.62 mg/dL (ref 0.61–1.24)
GFR, Estimated: >60 mL/min (ref >60)
Glucose, Bld: 127 mg/dL — ABNORMAL HIGH (ref 70–99)
Potassium: 4.4 mmol/L (ref 3.5–5.1)
Sodium: 140 mmol/L (ref 135–145)

## 2021-08-20 LAB — PHOSPHORUS: Phosphorus: 2.9 mg/dL (ref 2.5–4.6)

## 2021-08-20 LAB — GLUCOSE, CAPILLARY
Glucose-Capillary: 146 mg/dL — ABNORMAL HIGH (ref 70–99)
Glucose-Capillary: 219 mg/dL — ABNORMAL HIGH (ref 70–99)

## 2021-08-20 MED ORDER — SIMETHICONE 40 MG/0.6ML PO SUSP
80.0000 mg | Freq: Four times a day (QID) | ORAL | Status: DC | PRN
  Filled 2021-08-20: qty 1.2

## 2021-08-20 MED ORDER — METOCLOPRAMIDE HCL 5 MG/ML IJ SOLN: 5.0000 mg | Freq: Three times a day (TID) | INTRAMUSCULAR | Status: DC | PRN

## 2021-08-20 MED ORDER — BISACODYL 10 MG RE SUPP: 10.0000 mg | Freq: Two times a day (BID) | RECTAL | Status: DC | PRN

## 2021-08-20 MED ORDER — MAGIC MOUTHWASH
15.0000 mL | Freq: Four times a day (QID) | ORAL | Status: DC | PRN
  Filled 2021-08-20: qty 15

## 2021-08-20 MED ORDER — LACTATED RINGERS IV SOLN: INTRAVENOUS | Status: DC

## 2021-08-20 MED ORDER — ENALAPRILAT 1.25 MG/ML IV SOLN
0.6250 mg | Freq: Four times a day (QID) | INTRAVENOUS | Status: DC | PRN
  Filled 2021-08-20: qty 1

## 2021-08-20 MED ORDER — METOPROLOL TARTRATE 5 MG/5ML IV SOLN: 5.0000 mg | Freq: Four times a day (QID) | INTRAVENOUS | Status: DC | PRN

## 2021-08-20 MED ORDER — MENTHOL 3 MG MT LOZG: 1.0000 | LOZENGE | OROMUCOSAL | Status: DC | PRN

## 2021-08-20 MED ORDER — TRAVASOL 10 % IV SOLN
INTRAVENOUS | Status: AC
  Filled 2021-08-20: qty 1207.4

## 2021-08-20 MED ORDER — ACETAMINOPHEN 650 MG RE SUPP: 650.0000 mg | Freq: Four times a day (QID) | RECTAL | Status: DC | PRN

## 2021-08-20 MED ORDER — LACTATED RINGERS IV BOLUS: 1000.0000 mL | Freq: Three times a day (TID) | INTRAVENOUS | Status: DC | PRN

## 2021-08-20 MED ORDER — LIP MEDEX EX OINT
1.0000 "application " | TOPICAL_OINTMENT | Freq: Two times a day (BID) | CUTANEOUS | Status: DC
  Administered 2021-08-20 – 2021-09-07 (×31): 1 via TOPICAL
  Filled 2021-08-20 (×2): qty 7

## 2021-08-20 NOTE — Progress Notes (Signed)
PHARMACY - TOTAL PARENTERAL NUTRITION CONSULT NOTE  Indication: Small bowel obstruction  Patient Measurements: Height: 6\' 1"  (185.4 cm) Weight: 69.6 kg (153 lb 7 oz) IBW/kg (Calculated) : 79.9 TPN AdjBW (KG): 70 Body mass index is 20.24 kg/m. Usual Weight: 74 kg  Assessment:  70 y.o. male with PMH significant for HTN, CHF with LVEF ~25%, GERD, BPH, prior history of alcoholism who presented with complaints of lower abdominal pain, found to have SBO.  Patient has remained NPO/CLD, now for more than 7 days and at risk of becoming severely malnourished. Pharmacy is consulted to manage TPN.   Per RD's conversation with patient, reported he was eating well PTA. States he would have abdominal pain when eating. Pt reports typical intake of breakfast: eggs, Lunch: sandwich, Dinner: fried chicken, rice, and broccoli. Per patient 11/15, he sometimes skips meals at home even though his wife is a good cook because he just doesn't feel like eating.  Glucose / Insulin: no hx DM - CBGs acceptable on and off TPN.  No SSI. Electrolytes: all WNL (Phos low normal) Renal: SCr < 1, BUN down to 29 Hepatic: LFTs / Tbili / TG WNL, albumin 2.8 Intake / Output; MIVF: UOP 0.5 ml/kg/hr, NG 12/15, BM x2 (down), emesis x3 on 11/18 GI Imaging:  11/3 CT abd: SBO  11/4 Abd XR: small partial SBO 11/6 Abd XR: Persistent SBO  11/7 Abd XR: Persistent SBO  11/8 Abd XR: SBO with interval worsening 11/9 Abd XR: post-op free air, small bowel dilatation GI Surgeries / Procedures:  11/9 ex-lap with LOA   Central access: PICC placed 08/10/21 TPN start date:  08/11/21  Nutritional Goals: Goal continuous TPN rate is 90 mL/hr (provides 118 g of protein and 2334 kcals per day)  RD Assessment: Estimated Needs Total Energy Estimated Needs: 2300-2500 Total Protein Estimated Needs: 115-130 grams Total Fluid Estimated Needs: > 2 L  Current Nutrition:  TPN Ensure Enlive TID - none charted given on 11/19 NPO again  11/20  Plan:   Increase TPN back to full support with NPO status and NGT to LIWS  Cycle TPN over 12 hours (rate 98-196 ml/hr, GIR 3.75-7.51 mg/kg/min) - provides 121g AA, 345g CHO, 65g ILE and 2302kCal Electrolytes in TPN: reduce Na 142mEq/L, reduce K 30mEq/L, add back Mg 17mEq/L on 11/19, Ca 5 mEq/L, increase Phos to 35mmol/L; Cl:Ac 1:1 for now Add back MVI and trace elements to TPN 11/20 - patient refused PO multivitamin  Continue Q4H CBG checks.  Off SSI. Monitor TPN labs on Mon/Thurs, tolerance to 12-hr cycle/CBGs to reduce frequency  Jakya Dovidio D. 12/20, PharmD, BCPS, BCCCP 08/20/2021, 10:51 AM

## 2021-08-20 NOTE — Progress Notes (Signed)
Mobility Specialist Progress Note:   08/20/21 1145  Mobility  Activity Ambulated to bathroom  Level of Assistance Contact guard assist, steadying assist  Assistive Device Front wheel walker  Distance Ambulated (ft) 20 ft  Mobility Out of bed for toileting  Mobility Response Tolerated well  Mobility performed by Mobility specialist  $Mobility charge 1 Mobility   Pt voiced abdominal pain prior to ambulation. Displayed generalized weakness during ambulation. Declined further mobility for no specific reason. Pt states he will amb in hall later, will f/u if schedule permits.   Addison Lank Mobility Specialist  Phone [phone]

## 2021-08-20 NOTE — Progress Notes (Signed)
Mobility Specialist Progress Note:   08/20/21 1430  Mobility  Activity Ambulated in hall  Level of Assistance Contact guard assist, steadying assist  Assistive Device Front wheel walker  Distance Ambulated (ft) 245 ft  Mobility Ambulated with assistance in hallway  Mobility Response Tolerated well  Mobility performed by Mobility specialist  $Mobility charge 1 Mobility   Pt asx during ambulation. Required max encouragement to get out in hallway, however pt seemed to enjoy. Pt back in bed with NGT placed back on suction and bed alarm on.  Addison Lank Mobility Specialist  Phone [phone]

## 2021-08-20 NOTE — Progress Notes (Signed)
[facility] PROGRESS NOTE    Progress Note  [name]  UKG:[id number] DOB: 09-22-1951 DOA: 08/03/2021 PCP: Sharon Seller, NP    Brief Narrative:   [name] is an 70 y.o. male essential hypertension chronic systolic heart failure with an EF of 20%, prior history of alcoholism comes in for abdominal pain suspected enteritis and ileitis seen by CT 3 general surgery.  Failed conservative management, he subsequently developed small bowel obstruction underwent exploratory laparotomy on 08/09/2021 which revealed entrapped by dense adhesions small bowel which were lysed, started on TPN.  Bowel function returning on 11/14, diet advanced a G-tube removed has been weaned off TPN.  Assessment/Plan:   SBO (small bowel obstruction) (HCC): Status post exploratory laparotomy has been weaned NG tube. Currently on TNA, try to keep potassium greater than 4 magnesium greater than 2. Abdominal x-ray showed positive Steritapes changes and adynamic ileus. NG tube to intermittent suction placed on 08/19/2021, almost a liter out. Ambulate patient as much as possible.  Out of bed to chair. Surgery managing, appreciate assistance. Avoid narcotics.    Severe protein caloric malnutrition:  Ensure 3 times daily as tolerated.  Chronic systolic heart failure: Appears euvolemic with an EF of 20%. Hold ACE inhibitor blood pressure is borderline.  Aortic sclerosis: Continue statin as discharged.  Leukopenia: Now resolved.  Influenza A with a positive PCR 08/13/2021: Complete his course of Tamiflu.  Acute kidney injury: Likely secondary to small bowel obstruction in the setting of ACE inhibitor use resolved with IV fluid hydration.   DVT prophylaxis: lovenox Family Communication:none Status is: Inpatient  Remains inpatient appropriate because: Acute small bowel obstruction      Code Status:     Code Status Orders  (From admission, onward)           Start     Ordered    08/03/21 1527  Full code  Continuous        08/03/21 1530           Code Status History     Date Active Date Inactive Code Status Order ID Comments User Context   05/16/2021 1719 05/17/2021 1656 Full Code [id number]  Noel Christmas, MD Inpatient   03/04/2016 0058 03/05/2016 1657 Full Code [id number]  Doug Sou, MD ED         IV Access:   Peripheral IV   Procedures and diagnostic studies:   DG Abd 2 Views  Result Date: 08/19/2021 CLINICAL DATA:  Nausea and vomiting for several days, initial encounter EXAM: ABDOMEN - 2 VIEW COMPARISON:  08/16/2021 FINDINGS: Postsurgical changes are again noted. Scattered bowel gas is noted. A few mildly dilated loops of small bowel are noted with air-fluid levels. Minimal free air is noted related to the prior surgery. No new focal abnormality is noted. IMPRESSION: Postoperative free air. Mild small bowel dilatation. Electronically Signed   By: Alcide Clever M.D.   On: 08/19/2021 19:26   DG Abd Portable 1V  Result Date: 08/19/2021 CLINICAL DATA:  Check gastric catheter placement EXAM: PORTABLE ABDOMEN - 1 VIEW COMPARISON:  Film from earlier in the same day. FINDINGS: Gastric catheter is noted within the stomach. Scattered large and small bowel gas is noted. Postsurgical changes are seen. IMPRESSION: Gastric catheter within the stomach. Electronically Signed   By: Alcide Clever M.D.   On: 08/19/2021 19:24     Medical Consultants:   None.   Subjective:    [name] abdominal pain improved.  Objective:  Vitals:   08/19/21 1537 08/19/21 1837 08/19/21 2016 08/20/21 0439  BP: 125/87 112/79 116/73 114/72  Pulse: (!) 115 (!) 105 (!) 107 97  Resp: 18 18 18 18   Temp: 98.6 F (37 C)  97.9 F (36.6 C) 98.5 F (36.9 C)  TempSrc: Oral  Oral Oral  SpO2: 99% 96% 97% 94%  Weight:      Height:       SpO2: 94 % O2 Flow Rate (L/min): 1 L/min   Intake/Output Summary (Last 24 hours) at 08/20/2021 0818 Last data filed at  08/20/2021 0600 Gross per 24 hour  Intake 1667.03 ml  Output 2000 ml  Net -332.97 ml    Filed Weights   08/17/21 0332 08/18/21 0352 08/19/21 0356  Weight: 69.9 kg 69.8 kg 69.6 kg    Exam: General exam: In no acute distress. Respiratory system: Good air movement and clear to auscultation. Cardiovascular system: S1 & S2 heard, RRR. No JVD. Gastrointestinal system: Abdomen is nondistended, soft and nontender.  Extremities: No pedal edema. Skin: No rashes, lesions or ulcers Psychiatry: Judgement and insight appear normal. Mood & affect appropriate.   Data Reviewed:    Labs: Basic Metabolic Panel: Recent Labs  Lab 08/14/21 0336 08/16/21 0309 08/17/21 0436 08/19/21 0054 08/19/21 1030 08/20/21 0533  NA 134* 136 134* 138  --  140  K 4.1 5.1 4.7 3.9  --  4.4  CL 102 101 103 103  --  106  CO2 26 27 24 27   --  29  GLUCOSE 131* 132* 164* 194*  --  127*  BUN 16 23 35* 31*  --  29*  CREATININE 0.58* 0.68 0.67 0.72  --  0.62  CALCIUM 8.9 9.0 8.9 9.2  --  8.7*  MG 2.1 2.3 2.4 2.2  --   --   PHOS 3.8 3.4 4.3 3.3 3.9 2.9    GFR Estimated Creatinine Clearance: 85.8 mL/min (by C-G formula based on SCr of 0.62 mg/dL). Liver Function Tests: Recent Labs  Lab 08/14/21 0336 08/17/21 0436  AST 35 41  ALT 31 65*  ALKPHOS 31* 57  BILITOT 0.3 0.4  PROT 6.1* 6.0*  ALBUMIN 2.8* 2.8*    No results for input(s): LIPASE, AMYLASE in the last 168 hours. No results for input(s): AMMONIA in the last 168 hours. Coagulation profile No results for input(s): INR, PROTIME in the last 168 hours. COVID-19 Labs  No results for input(s): DDIMER, FERRITIN, LDH, CRP in the last 72 hours.  Lab Results  Component Value Date   SARSCOV2NAA NEGATIVE 08/03/2021   SARSCOV2NAA RESULT: NEGATIVE 05/12/2021    CBC: Recent Labs  Lab 08/16/21 0810  WBC 4.9  HGB 11.3*  HCT 36.1*  MCV 113.5*  PLT 383    Cardiac Enzymes: No results for input(s): CKTOTAL, CKMB, CKMBINDEX, TROPONINI in the last  168 hours. BNP (last 3 results) No results for input(s): PROBNP in the last 8760 hours. CBG: Recent Labs  Lab 08/19/21 1152 08/19/21 1539 08/19/21 2154 08/19/21 2357 08/20/21 0432  GLUCAP 109* 116* 158* 181* 146*    D-Dimer: No results for input(s): DDIMER in the last 72 hours. Hgb A1c: No results for input(s): HGBA1C in the last 72 hours. Lipid Profile: No results for input(s): CHOL, HDL, LDLCALC, TRIG, CHOLHDL, LDLDIRECT in the last 72 hours.  Thyroid function studies: No results for input(s): TSH, T4TOTAL, T3FREE, THYROIDAB in the last 72 hours.  Invalid input(s): FREET3 Anemia work up: No results for input(s): VITAMINB12, FOLATE, FERRITIN, TIBC, IRON,  RETICCTPCT in the last 72 hours. Sepsis Labs: Recent Labs  Lab 08/16/21 0810  WBC 4.9    Microbiology No results found for this or any previous visit (from the past 240 hour(s)).   Medications:    acetaminophen  1,000 mg Oral Q6H   aspirin EC  81 mg Oral Daily   bisacodyl  10 mg Rectal Daily   Chlorhexidine Gluconate Cloth  6 each Topical Daily   enoxaparin (LOVENOX) injection  40 mg Subcutaneous Q24H   feeding supplement  237 mL Oral TID BM   influenza vaccine adjuvanted  0.5 mL Intramuscular Tomorrow-1000   lisinopril  5 mg Oral Daily   mouth rinse  15 mL Mouth Rinse BID   metoCLOPramide  5 mg Oral TID AC & HS   multivitamin with minerals  1 tablet Oral Daily   polycarbophil  625 mg Oral BID   rosuvastatin  5 mg Oral Daily   sodium chloride flush  10-40 mL Intracatheter Q12H   sodium chloride flush  3 mL Intravenous Q12H   Continuous Infusions:  sodium chloride     lactated ringers     methocarbamol (ROBAXIN) IV          LOS: 16 days   Charlynne Cousins  [facility]  08/20/2021, 8:18 AM

## 2021-08-20 NOTE — Progress Notes (Signed)
Christopher DANTIN [id number] March 13, 1951  CARE TEAM:  PCP: Lauree Chandler, NP  Outpatient Care Team: Patient Care Team: Lauree Chandler, NP as PCP - General (Geriatric Medicine) [name], MD as PCP - Cardiology (Cardiology)  Inpatient Treatment Team: Treatment Team: Attending Provider: Aileen Fass, Tammi Klippel, MD; Consulting Physician: Edison Pace, Md, MD; Rounding Team: Minerva Ends, MD; Case Manager: Bartholomew Crews, RN; Utilization Review: Claudie Leach, RN; Technician: Vista Deck, NT; Registered Nurse: Will Bonnet, RN; Mobility Specialist: Therisa Doyne   Problem List:   Principal Problem:   SBO (small bowel obstruction) [facility]) Active Problems:   Chronic systolic CHF (congestive heart failure) ([place])   Protein-calorie malnutrition, severe   Aortic atherosclerosis ([place])   11 Days Post-Op  08/09/2021  POST-OPERATIVE DIAGNOSIS:  Small bowel obstruction   PROCEDURE:   EXPLORATORY LAPAROTOMY LYSIS OF ADHESIONS 40MIN   SURGEON:  Surgeon(s): Georganna Skeans, MD    Assessment  Stabilizing with fair recovery  [facility] Stay = 16 days)  Assessment/Plan  SBO POD 10 s/p ex lap LOA by Dr. Grandville Silos 11/9 with findings of dense adhesion near distal ileum down to the mesentery -Prolonged ileus in the setting of chronic obstruction  - NGT out 11/13.  Recurrent N/V - NGT replaced 11/19>> - abd xray 11/19 improved but not WNL - encouraged ambulation and incentive spirometry use.  - PICC/TPN    -bowel rest.  Usually SBO postop resolve w conservative measures - may need repeat CT scan or SB protocol - Dr Redmond Pulling to come on & eval this coming week -?gastroparesis? - no h/o DM but elevated glc- check HgbA1C   FEN -TPN.  IVF bolus ID - ancef periop VTE - SCDs, lovenox Foley - placed periop 11/9, d/c 11/10   AKI HTN HLD COPD CHF          20 minutes spent in review, evaluation, examination, counseling, and coordination of care.   I have reviewed  this patient's available data, including medical history, events of note, physical examination and test results as part of my evaluation.  A significant portion of that time was spent in counseling.  Care during the described time interval was provided by me.  08/20/2021    Subjective: (Chief complaint)   Patient with nausea vomiting after trying solid food.  NG tube replaced. Over a liter out.  Objective:  Vital signs:  Vitals:   08/19/21 1837 08/19/21 2016 08/20/21 0439 08/20/21 0820  BP: 112/79 116/73 114/72 128/69  Pulse: (!) 105 (!) 107 97 98  Resp: 18 18 18 17   Temp:  97.9 F (36.6 C) 98.5 F (36.9 C) 98.7 F (37.1 C)  TempSrc:  Oral Oral Oral  SpO2: 96% 97% 94% 99%  Weight:      Height:        Last BM Date: 08/19/21  Intake/Output   Yesterday:  11/19 0701 - 11/20 0700 In: 1667 [I.V.:1667] Out: 2000 [Urine:800; Emesis/NG output:1200] This shift:  No intake/output data recorded.  Bowel function:  Flatus: YES  BM:  YES  Drain: (No drain)   Physical Exam:  General: Pt awake/alert in no acute distress Eyes: PERRL, normal EOM.  Sclera clear.  No icterus Neuro: CN II-XII intact w/o focal sensory/motor deficits. Lymph: No head/neck/groin lymphadenopathy Psych:  No delerium/psychosis/paranoia.  Oriented x 4 HENT: Normocephalic, Mucus membranes moist.  No thrush Neck: Supple, No tracheal deviation.  No obvious thyromegaly Chest: No pain to chest wall compression.  Good respiratory excursion.  No  audible wheezing CV:  Pulses intact.  Regular rhythm.  No major extremity edema MS: Normal AROM mjr joints.  No obvious deformity  Abdomen: Soft.  Moderately distended.  Nontender.  Staples intact on incision without cellulitis nor dehiscence no evidence of peritonitis.  No incarcerated hernias.  Ext:   No deformity.  No mjr edema.  No cyanosis Skin: No petechiae / purpurea.  No major sores.  Warm and dry    Results:   Cultures: Recent Results (from the  past 720 hour(s))  Resp Panel by RT-PCR (Flu A&B, Covid) Nasopharyngeal Swab     Status: Abnormal   Collection Time: 08/03/21 12:31 PM   Specimen: Nasopharyngeal Swab; Nasopharyngeal(NP) swabs in vial transport medium  Result Value Ref Range Status   SARS Coronavirus 2 by RT PCR NEGATIVE NEGATIVE Final    Comment: (NOTE) SARS-CoV-2 target nucleic acids are NOT DETECTED.  The SARS-CoV-2 RNA is generally detectable in upper respiratory specimens during the acute phase of infection. The lowest concentration of SARS-CoV-2 viral copies this assay can detect is 138 copies/mL. A negative result does not preclude SARS-Cov-2 infection and should not be used as the sole basis for treatment or other patient management decisions. A negative result may occur with  improper specimen collection/handling, submission of specimen other than nasopharyngeal swab, presence of viral mutation(s) within the areas targeted by this assay, and inadequate number of viral copies(<138 copies/mL). A negative result must be combined with clinical observations, patient history, and epidemiological information. The expected result is Negative.  Fact Sheet for Patients:  EntrepreneurPulse.com.au  Fact Sheet for Healthcare Providers:  IncredibleEmployment.be  This test is no t yet approved or cleared by the [place] FDA and  has been authorized for detection and/or diagnosis of SARS-CoV-2 by FDA under an Emergency Use Authorization (EUA). This EUA will remain  in effect (meaning this test can be used) for the duration of the COVID-19 declaration under Section 564(b)(1) of the Act, 21 U.S.C.section 360bbb-3(b)(1), unless the authorization is terminated  or revoked sooner.       Influenza A by PCR POSITIVE (A) NEGATIVE Final   Influenza B by PCR NEGATIVE NEGATIVE Final    Comment: (NOTE) The Xpert Xpress SARS-CoV-2/FLU/RSV plus assay is intended as an aid in the diagnosis  of influenza from Nasopharyngeal swab specimens and should not be used as a sole basis for treatment. Nasal washings and aspirates are unacceptable for Xpert Xpress SARS-CoV-2/FLU/RSV testing.  Fact Sheet for Patients: EntrepreneurPulse.com.au  Fact Sheet for Healthcare Providers: IncredibleEmployment.be  This test is not yet approved or cleared by the [place] FDA and has been authorized for detection and/or diagnosis of SARS-CoV-2 by FDA under an Emergency Use Authorization (EUA). This EUA will remain in effect (meaning this test can be used) for the duration of the COVID-19 declaration under Section 564(b)(1) of the Act, 21 U.S.C. section 360bbb-3(b)(1), unless the authorization is terminated or revoked.  Performed at [facility], [place] [address], [place], [place] [address]     Labs: Results for orders placed or performed during the hospital encounter of 08/03/21 (from the past 48 hour(s))  Glucose, capillary     Status: None   Collection Time: 08/18/21 12:10 PM  Result Value Ref Range   Glucose-Capillary 93 70 - 99 mg/dL    Comment: Glucose reference range applies only to samples taken after fasting for at least 8 hours.  Glucose, capillary     Status: None   Collection Time: 08/18/21  5:11  PM  Result Value Ref Range   Glucose-Capillary 98 70 - 99 mg/dL    Comment: Glucose reference range applies only to samples taken after fasting for at least 8 hours.  Glucose, capillary     Status: Abnormal   Collection Time: 08/18/21  7:54 PM  Result Value Ref Range   Glucose-Capillary 148 (H) 70 - 99 mg/dL    Comment: Glucose reference range applies only to samples taken after fasting for at least 8 hours.  Glucose, capillary     Status: Abnormal   Collection Time: 08/19/21 12:16 AM  Result Value Ref Range   Glucose-Capillary 200 (H) 70 - 99 mg/dL    Comment: Glucose reference range applies only to samples taken after fasting for at  least 8 hours.  Basic metabolic panel     Status: Abnormal   Collection Time: 08/19/21 12:54 AM  Result Value Ref Range   Sodium 138 135 - 145 mmol/L   Potassium 3.9 3.5 - 5.1 mmol/L   Chloride 103 98 - 111 mmol/L   CO2 27 22 - 32 mmol/L   Glucose, Bld 194 (H) 70 - 99 mg/dL    Comment: Glucose reference range applies only to samples taken after fasting for at least 8 hours.   BUN 31 (H) 8 - 23 mg/dL   Creatinine, Ser 8.02 0.61 - 1.24 mg/dL   Calcium 9.2 8.9 - 23.3 mg/dL   GFR, Estimated >61 >22 mL/min    Comment: (NOTE) Calculated using the CKD-EPI Creatinine Equation (2021)    Anion gap 8 5 - 15    Comment: Performed at [facility], [address], [place], [place] [address]  Magnesium     Status: None   Collection Time: 08/19/21 12:54 AM  Result Value Ref Range   Magnesium 2.2 1.7 - 2.4 mg/dL    Comment: Performed at [facility], [address], [place], [place] [address]  Phosphorus     Status: None   Collection Time: 08/19/21 12:54 AM  Result Value Ref Range   Phosphorus 3.3 2.5 - 4.6 mg/dL    Comment: Performed at [facility], [address], [place], [place] [address]  Glucose, capillary     Status: Abnormal   Collection Time: 08/19/21  4:10 AM  Result Value Ref Range   Glucose-Capillary 193 (H) 70 - 99 mg/dL    Comment: Glucose reference range applies only to samples taken after fasting for at least 8 hours.  Glucose, capillary     Status: Abnormal   Collection Time: 08/19/21  8:47 AM  Result Value Ref Range   Glucose-Capillary 124 (H) 70 - 99 mg/dL    Comment: Glucose reference range applies only to samples taken after fasting for at least 8 hours.  Prealbumin     Status: None   Collection Time: 08/19/21 10:30 AM  Result Value Ref Range   Prealbumin 28.6 18 - 38 mg/dL    Comment: Performed at [facility], [address] [place], [place] [address]  Phosphorus     Status: None   Collection Time: 08/19/21 10:30 AM  Result Value Ref Range    Phosphorus 3.9 2.5 - 4.6 mg/dL    Comment: Performed at [facility], [address], [place], [place] [address]  Glucose, capillary     Status: Abnormal   Collection Time: 08/19/21 11:52 AM  Result Value Ref Range   Glucose-Capillary 109 (H) 70 - 99 mg/dL  Comment: Glucose reference range applies only to samples taken after fasting for at least 8 hours.  Glucose, capillary     Status: Abnormal   Collection Time: 08/19/21  3:39 PM  Result Value Ref Range   Glucose-Capillary 116 (H) 70 - 99 mg/dL    Comment: Glucose reference range applies only to samples taken after fasting for at least 8 hours.  Glucose, capillary     Status: Abnormal   Collection Time: 08/19/21  9:54 PM  Result Value Ref Range   Glucose-Capillary 158 (H) 70 - 99 mg/dL    Comment: Glucose reference range applies only to samples taken after fasting for at least 8 hours.  Glucose, capillary     Status: Abnormal   Collection Time: 08/19/21 11:57 PM  Result Value Ref Range   Glucose-Capillary 181 (H) 70 - 99 mg/dL    Comment: Glucose reference range applies only to samples taken after fasting for at least 8 hours.  Glucose, capillary     Status: Abnormal   Collection Time: 08/20/21  4:32 AM  Result Value Ref Range   Glucose-Capillary 146 (H) 70 - 99 mg/dL    Comment: Glucose reference range applies only to samples taken after fasting for at least 8 hours.  Basic metabolic panel     Status: Abnormal   Collection Time: 08/20/21  5:33 AM  Result Value Ref Range   Sodium 140 135 - 145 mmol/L   Potassium 4.4 3.5 - 5.1 mmol/L   Chloride 106 98 - 111 mmol/L   CO2 29 22 - 32 mmol/L   Glucose, Bld 127 (H) 70 - 99 mg/dL    Comment: Glucose reference range applies only to samples taken after fasting for at least 8 hours.   BUN 29 (H) 8 - 23 mg/dL   Creatinine, Ser 9.32 0.61 - 1.24 mg/dL   Calcium 8.7 (L) 8.9 - 10.3 mg/dL   GFR, Estimated >35 >57 mL/min    Comment: (NOTE) Calculated using the CKD-EPI Creatinine  Equation (2021)    Anion gap 5 5 - 15    Comment: Performed at [facility], [address], [place], [place] [address]  Phosphorus     Status: None   Collection Time: 08/20/21  5:33 AM  Result Value Ref Range   Phosphorus 2.9 2.5 - 4.6 mg/dL    Comment: Performed at [facility], [address] [place], [place] [address]    Imaging / Studies: DG Abd 2 Views  Result Date: 08/19/2021 CLINICAL DATA:  Nausea and vomiting for several days, initial encounter EXAM: ABDOMEN - 2 VIEW COMPARISON:  08/16/2021 FINDINGS: Postsurgical changes are again noted. Scattered bowel gas is noted. A few mildly dilated loops of small bowel are noted with air-fluid levels. Minimal free air is noted related to the prior surgery. No new focal abnormality is noted. IMPRESSION: Postoperative free air. Mild small bowel dilatation. Electronically Signed   By: Alcide Clever M.D.   On: 08/19/2021 19:26   DG Abd Portable 1V  Result Date: 08/19/2021 CLINICAL DATA:  Check gastric catheter placement EXAM: PORTABLE ABDOMEN - 1 VIEW COMPARISON:  Film from earlier in the same day. FINDINGS: Gastric catheter is noted within the stomach. Scattered large and small bowel gas is noted. Postsurgical changes are seen. IMPRESSION: Gastric catheter within the stomach. Electronically Signed   By: Alcide Clever M.D.   On: 08/19/2021 19:24    Medications / Allergies: per chart  Antibiotics: Anti-infectives (From admission, onward)  Start     Dose/Rate Route Frequency Ordered Stop   08/09/21 1000  ceFAZolin (ANCEF) IVPB 2g/100 mL premix  Status:  Discontinued        2 g 200 mL/hr over 30 Minutes Intravenous To Short Stay 08/09/21 0758 08/10/21 1000   08/04/21 2200  oseltamivir (TAMIFLU) capsule 30 mg        30 mg Oral 2 times daily 08/04/21 0822 08/09/21 0959   08/04/21 1000  oseltamivir (TAMIFLU) capsule 75 mg        75 mg Oral  Once 08/04/21 0825 08/04/21 1116   08/03/21 1645  cefTRIAXone (ROCEPHIN) 2 g in sodium  chloride 0.9 % 100 mL IVPB  Status:  Discontinued        2 g 200 mL/hr over 30 Minutes Intravenous Every 24 hours 08/03/21 1631 08/06/21 1519   08/03/21 1645  metroNIDAZOLE (FLAGYL) IVPB 500 mg  Status:  Discontinued        500 mg 100 mL/hr over 60 Minutes Intravenous Every 12 hours 08/03/21 1631 08/06/21 1519         Note: Portions of this report may have been transcribed using voice recognition software. Every effort was made to ensure accuracy; however, inadvertent computerized transcription errors may be present.   Any transcriptional errors that result from this process are unintentional.    Adin Hector, MD, FACS, MASCRS Esophageal, Gastrointestinal & Colorectal Surgery Robotic and Minimally Invasive Surgery  [facility], [place], [place], [place] [address] [phone] Fax [phone] Main  CONTACT INFORMATION:  Weekday (9AM-5PM): Call [facility] main office at [phone]  Weeknight (5PM-9AM) or Weekend/Holiday: Check [email or web address] (password " [email or web address]") for General Surgery [facility] coverage  (Please, do not use SecureChat as it is not reliable communication to operating surgeons for immediate patient care)      08/20/2021  8:31 AM

## 2021-08-21 ENCOUNTER — Inpatient Hospital Stay (HOSPITAL_COMMUNITY): Payer: Medicare HMO

## 2021-08-21 DIAGNOSIS — K56609 Unspecified intestinal obstruction, unspecified as to partial versus complete obstruction: Secondary | ICD-10-CM | POA: Diagnosis not present

## 2021-08-21 DIAGNOSIS — I5022 Chronic systolic (congestive) heart failure: Secondary | ICD-10-CM | POA: Diagnosis not present

## 2021-08-21 DIAGNOSIS — N179 Acute kidney failure, unspecified: Secondary | ICD-10-CM | POA: Diagnosis not present

## 2021-08-21 DIAGNOSIS — K9189 Other postprocedural complications and disorders of digestive system: Secondary | ICD-10-CM | POA: Diagnosis not present

## 2021-08-21 LAB — COMPREHENSIVE METABOLIC PANEL
ALT: 48 U/L — ABNORMAL HIGH (ref 0–44)
AST: 20 U/L (ref 15–41)
Albumin: 2.8 g/dL — ABNORMAL LOW (ref 3.5–5.0)
Alkaline Phosphatase: 40 U/L (ref 38–126)
Anion gap: 10 (ref 5–15)
BUN: 24 mg/dL — ABNORMAL HIGH (ref 8–23)
CO2: 26 mmol/L (ref 22–32)
Calcium: 8.9 mg/dL (ref 8.9–10.3)
Chloride: 105 mmol/L (ref 98–111)
Creatinine, Ser: 0.59 mg/dL — ABNORMAL LOW (ref 0.61–1.24)
GFR, Estimated: >60 mL/min (ref >60)
Glucose, Bld: 148 mg/dL — ABNORMAL HIGH (ref 70–99)
Potassium: 3.8 mmol/L (ref 3.5–5.1)
Sodium: 141 mmol/L (ref 135–145)
Total Bilirubin: 0.2 mg/dL — ABNORMAL LOW (ref 0.3–1.2)
Total Protein: 6 g/dL — ABNORMAL LOW (ref 6.5–8.1)

## 2021-08-21 LAB — MAGNESIUM: Magnesium: 2.1 mg/dL (ref 1.7–2.4)

## 2021-08-21 LAB — GLUCOSE, CAPILLARY
Glucose-Capillary: 163 mg/dL — ABNORMAL HIGH (ref 70–99)
Glucose-Capillary: 102 mg/dL — ABNORMAL HIGH (ref 70–99)
Glucose-Capillary: 80 mg/dL (ref 70–99)
Glucose-Capillary: 82 mg/dL (ref 70–99)
Glucose-Capillary: 183 mg/dL — ABNORMAL HIGH (ref 70–99)

## 2021-08-21 LAB — TRIGLYCERIDES: Triglycerides: 55 mg/dL (ref <150)

## 2021-08-21 LAB — HEMOGLOBIN A1C
Hgb A1c MFr Bld: 6.1 % — ABNORMAL HIGH (ref 4.8–5.6)
Mean Plasma Glucose: 128.37 mg/dL

## 2021-08-21 LAB — PHOSPHORUS: Phosphorus: 3.5 mg/dL (ref 2.5–4.6)

## 2021-08-21 MED ORDER — IOHEXOL 300 MG/ML  SOLN
100.0000 mL | Freq: Once | INTRAMUSCULAR | Status: AC | PRN
  Administered 2021-08-21: 100 mL via INTRAVENOUS

## 2021-08-21 MED ORDER — TRAVASOL 10 % IV SOLN
INTRAVENOUS | Status: AC
  Filled 2021-08-21: qty 1207.4

## 2021-08-21 NOTE — Progress Notes (Signed)
Physical Therapy Treatment Patient Details Name: [name] MRN: [id number] DOB: 11-Jul-1951 Today's Date: 08/21/2021   History of Present Illness Patient is a 70 y/o male who presents on 08/03/21 due to abdominal pain and diarrhea. CT abdomen/pelvis- dilated fluid and air-filled small intestine consistent with small bowel obstruction. Per surgery, ilieus vs enteritis vs partial SBO. Exploratory lap 11/9.   PMH includes HTN, chronic systolic CHF- EF 35%, prior history of alcoholism    PT Comments    Patient received in bed, family in room. Patient reluctant, but with family encouragement he is agreeable to PT session. Patient is mod independent with bed mobility. Transfers with min guard and ambulated with RW min guard to supervision >300 feet. He ambulates at near normal pace, no significant difficulties but benefits from using RW due to weakness. Patient will continue to benefit from skilled PT while here to improve functional independence and strength.       Recommendations for follow up therapy are one component of a multi-disciplinary discharge planning process, led by the attending physician.  Recommendations may be updated based on patient status, additional functional criteria and insurance authorization.  Follow Up Recommendations  Home health PT     Assistance Recommended at Discharge Intermittent Supervision/Assistance  Equipment Recommendations  Rolling walker (2 wheels)    Recommendations for Other Services       Precautions / Restrictions Precautions Precaution Comments: mod fall, NG tube Restrictions Weight Bearing Restrictions: No     Mobility  Bed Mobility Overal bed mobility: Modified Independent Bed Mobility: Supine to Sit;Sit to Supine     Supine to sit: Modified independent (Device/Increase time) Sit to supine: Modified independent (Device/Increase time)        Transfers Overall transfer level: Needs assistance Equipment used: Rolling walker (2  wheels) Transfers: Sit to/from Stand Sit to Stand: Supervision                Ambulation/Gait Ambulation/Gait assistance: Min guard Gait Distance (Feet): 300 Feet Assistive device: Rolling walker (2 wheels) Gait Pattern/deviations: Step-through pattern Gait velocity: slightly decreased     General Gait Details: ambulates with min guard to supervision.   Stairs             Wheelchair Mobility    Modified Rankin (Stroke Patients Only)       Balance Overall balance assessment: Needs assistance;History of Falls Sitting-balance support: Feet supported Sitting balance-Leahy Scale: Good     Standing balance support: Bilateral upper extremity supported;During functional activity;Reliant on assistive device for balance Standing balance-Leahy Scale: Fair                              Cognition Arousal/Alertness: Awake/alert Behavior During Therapy: WFL for tasks assessed/performed Overall Cognitive Status: Within Functional Limits for tasks assessed                                 General Comments: mumbled speech        Exercises      General Comments        Pertinent Vitals/Pain Pain Assessment: No/denies pain    Home Living                          Prior Function            PT Goals (current goals can now be  found in the care plan section) Acute Rehab PT Goals Patient Stated Goal: get back to work, get home PT Goal Formulation: With patient Time For Goal Achievement: 08/30/21 Potential to Achieve Goals: Good Progress towards PT goals: Progressing toward goals    Frequency    Min 3X/week      PT Plan Current plan remains appropriate    Co-evaluation              AM-PAC PT "6 Clicks" Mobility   Outcome Measure  Help needed turning from your back to your side while in a flat bed without using bedrails?: None Help needed moving from lying on your back to sitting on the side of a flat bed  without using bedrails?: None Help needed moving to and from a bed to a chair (including a wheelchair)?: A Little Help needed standing up from a chair using your arms (e.g., wheelchair or bedside chair)?: A Little Help needed to walk in hospital room?: A Little Help needed climbing 3-5 steps with a railing? : A Little 6 Click Score: 20    End of Session Equipment Utilized During Treatment: Gait belt Activity Tolerance: Patient tolerated treatment well Patient left: in bed;with call bell/phone within reach Nurse Communication: Mobility status PT Visit Diagnosis: Muscle weakness (generalized) (M62.81)     Time: 1350-1405 PT Time Calculation (min) (ACUTE ONLY): 15 min  Charges:  $Gait Training: 8-22 mins                     [employer], PT, [facility] 08/21/21,2:21 PM

## 2021-08-21 NOTE — Progress Notes (Signed)
Mobility Specialist Progress Note:   08/21/21 1030  Mobility  Activity Ambulated in hall  Level of Assistance Contact guard assist, steadying assist  Assistive Device Front wheel walker  Distance Ambulated (ft) 350 ft  Mobility Ambulated with assistance in hallway  Mobility Response Tolerated well  Mobility performed by Mobility specialist  $Mobility charge 1 Mobility   Pt agreed to ambulate in hall today. Asx during ambulation, distance limited d/t having to use BR. Pt left in BR with NT to wash up.  Addison Lank Mobility Specialist  Phone [phone]

## 2021-08-21 NOTE — Progress Notes (Signed)
PHARMACY - TOTAL PARENTERAL NUTRITION CONSULT NOTE  Indication: Small bowel obstruction  Patient Measurements: Height: 6\' 1"  (185.4 cm) Weight: 65 kg (143 lb 4.8 oz) IBW/kg (Calculated) : 79.9 TPN AdjBW (KG): 70 Body mass index is 18.91 kg/m. Usual Weight: 74 kg  Assessment:  70 y.o. male with PMH significant for HTN, CHF with LVEF ~25%, GERD, BPH, prior history of alcoholism who presented with complaints of lower abdominal pain, found to have SBO.  Patient has remained NPO/CLD, now for more than 7 days and at risk of becoming severely malnourished. Pharmacy is consulted to manage TPN.   Per RD's conversation with patient, reported he was eating well PTA. States he would have abdominal pain when eating. Per patient 11/15, he sometimes skips meals at home even though his wife is a good cook because he just doesn't feel like eating.  Glucose / Insulin: no hx DM, A1C 6.1 - CBGs 219 and 163 overnight on TPN (109-181 off TPN during 11/20).  No SSI. Electrolytes: all WNL (Phos low normal), K 3.8 Renal: SCr < 1, BUN down to 29 Hepatic: ALT 48 declining, Tbili 0.2, TG 55, albumin 2.8 same Intake / Output; MIVF: UOP 0.4 ml/kg/hr, NG 12/20 down, LBM 11/19 (Dulcolax supp daily) GI Imaging:  11/3 CT abd: SBO  11/4 Abd XR: small partial SBO 11/6 Abd XR: Persistent SBO  11/7 Abd XR: Persistent SBO  11/8 Abd XR: SBO with interval worsening 11/9 Abd XR: post-op free air, small bowel dilatation GI Surgeries / Procedures:  11/9 ex-lap with LOA   Central access: PICC placed 08/10/21 TPN start date:  08/11/21  Nutritional Goals: Goal continuous TPN rate is 90 mL/hr (provides 118 g of protein and 2334 kcals per day)  RD Assessment: Estimated Needs Total Energy Estimated Needs: 2300-2500 Total Protein Estimated Needs: 115-130 grams Total Fluid Estimated Needs: > 2 L  Current Nutrition:  TPN Ensure Enlive TID - (none since 11/18) NPO again 11/20  Plan:   No change to TPN formula  today. TPN back to full support with NPO status and NGT to LIWS  -Cycle TPN over 12 hours (rate 98-196 ml/hr, GIR 3.75-7.51 mg/kg/min) - provides 121g AA, 345g CHO, 65g ILE and 2302kCal -Electrolytes in TPN: reduce Na 187mEq/L, reduce K 25mEq/L, add back Mg 83mEq/L on 11/19, Ca 5 mEq/L, increase Phos to 24mmol/L; Cl:Ac 1:1 for now -Add back MVI and trace elements to TPN 11/20 - patient refused PO multivitamin  -Continue Q4H CBG checks.  Off SSI. -Monitor TPN labs on Mon/Thurs, tolerance to 12-hr cycle/CBGs to reduce frequency  Maiyah Goyne S. 12/20, PharmD, BCPS Clinical Staff Pharmacist [email or web address] 08/21/2021, 7:17 AM

## 2021-08-21 NOTE — Progress Notes (Addendum)
[facility] Progress Note  12 Days Post-Op  Subjective: CC:  Reports mild abd pain. Denies flatus or BM.   Objective: Vital signs in last 24 hours: Temp:  [97.9 F (36.6 C)-98.4 F (36.9 C)] 97.9 F (36.6 C) (11/21 0740) Pulse Rate:  [83-99] 87 (11/21 0740) Resp:  [17-19] 18 (11/21 0740) BP: (119-130)/(70-77) 119/70 (11/21 0740) SpO2:  [98 %-100 %] 100 % (11/21 0740) Weight:  [65 kg] 65 kg (11/21 0500) Last BM Date: 08/19/21  Intake/Output from previous day: 11/20 0701 - 11/21 0700 In: 2477.5 [I.V.:2477.5] Out: 1250 [Urine:650; Emesis/NG output:600] Intake/Output this shift: Total I/O In: 0  Out: 200 [Urine:200]  PE: Gen:  Alert, NAD, pleasant Pulm:  Normal effort ORA Abd: Soft, minimally tender around incisions, mild distention, midline incisions c/d/I w/ staples and no erythema or drainage NGT - dark/bilious,  600 cc documented last 24 hours  Skin: warm and dry, no rashes  Psych: A&Ox3   Lab Results:  No results for input(s): WBC, HGB, HCT, PLT in the last 72 hours. BMET Recent Labs    08/20/21 0533 08/21/21 0607  NA 140 141  K 4.4 3.8  CL 106 105  CO2 29 26  GLUCOSE 127* 148*  BUN 29* 24*  CREATININE 0.62 0.59*  CALCIUM 8.7* 8.9   PT/INR No results for input(s): LABPROT, INR in the last 72 hours. CMP     Component Value Date/Time   NA 141 08/21/2021 0607   NA 141 11/15/2017 1054   K 3.8 08/21/2021 0607   CL 105 08/21/2021 0607   CO2 26 08/21/2021 0607   GLUCOSE 148 (H) 08/21/2021 0607   BUN 24 (H) 08/21/2021 0607   BUN 13 11/15/2017 1054   CREATININE 0.59 (L) 08/21/2021 0607   CREATININE 0.84 09/28/2020 0956   CALCIUM 8.9 08/21/2021 0607   PROT 6.0 (L) 08/21/2021 0607   PROT 6.5 09/22/2015 0949   ALBUMIN 2.8 (L) 08/21/2021 0607   ALBUMIN 3.9 09/22/2015 0949   AST 20 08/21/2021 0607   ALT 48 (H) 08/21/2021 0607   ALKPHOS 40 08/21/2021 0607   BILITOT 0.2 (L) 08/21/2021 0607   BILITOT 0.8 09/22/2015 0949   GFRNONAA >60  08/21/2021 0607   GFRNONAA 90 09/28/2020 0956   GFRAA 104 09/28/2020 0956   Lipase     Component Value Date/Time   LIPASE 24 08/03/2021 1045       Studies/Results: DG Abd 2 Views  Result Date: 08/19/2021 CLINICAL DATA:  Nausea and vomiting for several days, initial encounter EXAM: ABDOMEN - 2 VIEW COMPARISON:  08/16/2021 FINDINGS: Postsurgical changes are again noted. Scattered bowel gas is noted. A few mildly dilated loops of small bowel are noted with air-fluid levels. Minimal free air is noted related to the prior surgery. No new focal abnormality is noted. IMPRESSION: Postoperative free air. Mild small bowel dilatation. Electronically Signed   By: Alcide Clever M.D.   On: 08/19/2021 19:26   DG Abd Portable 1V  Result Date: 08/19/2021 CLINICAL DATA:  Check gastric catheter placement EXAM: PORTABLE ABDOMEN - 1 VIEW COMPARISON:  Film from earlier in the same day. FINDINGS: Gastric catheter is noted within the stomach. Scattered large and small bowel gas is noted. Postsurgical changes are seen. IMPRESSION: Gastric catheter within the stomach. Electronically Signed   By: Alcide Clever M.D.   On: 08/19/2021 19:24    Anti-infectives: Anti-infectives (From admission, onward)    Start     Dose/Rate Route Frequency Ordered Stop   08/09/21 1000  ceFAZolin (ANCEF) IVPB 2g/100 mL premix  Status:  Discontinued        2 g 200 mL/hr over 30 Minutes Intravenous To Short Stay 08/09/21 0758 08/10/21 1000   08/04/21 2200  oseltamivir (TAMIFLU) capsule 30 mg        30 mg Oral 2 times daily 08/04/21 0822 08/09/21 0959   08/04/21 1000  oseltamivir (TAMIFLU) capsule 75 mg        75 mg Oral  Once 08/04/21 0825 08/04/21 1116   08/03/21 1645  cefTRIAXone (ROCEPHIN) 2 g in sodium chloride 0.9 % 100 mL IVPB  Status:  Discontinued        2 g 200 mL/hr over 30 Minutes Intravenous Every 24 hours 08/03/21 1631 08/06/21 1519   08/03/21 1645  metroNIDAZOLE (FLAGYL) IVPB 500 mg  Status:  Discontinued        500  mg 100 mL/hr over 60 Minutes Intravenous Every 12 hours 08/03/21 1631 08/06/21 1519        Assessment/Plan  SBO POD 12 s/p ex lap LOA by Dr. Janee Morn 11/9 with findings of dense adhesion near distal ileum down to the mesentery - afebrile, VSS - NGT initially removed 11/13 but replaced 11/19 due to persistent ileus w/ intolerance of PO.  - continue PICC and full support TPN  - last CT was 11/3, repeat today with PO and IV contrast to further evaluate; DDx: ileus, SBO; intra-abdominal infection felt to be less likely given afebrile, no leukocytosis (last checked 11/16)  FEN - NPO, NG to LIWS; A1c 6/1%, prealbumin 11/19 28.6 ID - ancef periop VTE - SCDs, lovenox Foley - placed periop 11/9, d/c 11/10   AKI HTN HLD COPD CHF   LOS: 17 days    Hosie Spangle, [facility] Please see Amion for pager number during day hours 7:00am-4:30pm

## 2021-08-21 NOTE — Progress Notes (Signed)
Mobility Specialist Progress Note:   08/21/21 1130  Mobility  Activity Ambulated to bathroom;Transferred:  Chair to bed  Level of Assistance Contact guard assist, steadying assist  Assistive Device Front wheel walker  Distance Ambulated (ft) 35 ft  Mobility Out of bed for toileting;Ambulated with assistance in room  Mobility Response Tolerated well  Mobility performed by Mobility specialist  $Mobility charge 1 Mobility   Received pt requesting to get back in bed. Ambulated to BR to void, then to bed with RW. Asx during ambulation. NG tube placed back on suction.   Addison Lank Mobility Specialist  Phone [phone]

## 2021-08-21 NOTE — Progress Notes (Signed)
[facility] PROGRESS NOTE    Progress Note  [name]  [id number] DOB: 1950/12/03 DOA: 08/03/2021 PCP: Lauree Chandler, NP    Brief Narrative:   [name] is an 70 y.o. male essential hypertension chronic systolic heart failure with an EF of 20%, prior history of alcoholism comes in for abdominal pain suspected enteritis and ileitis seen by CT 3 general surgery.  Failed conservative management, he subsequently developed small bowel obstruction underwent exploratory laparotomy on 08/09/2021 which revealed entrapped by dense adhesions small bowel which were lysed, started on TPN.  Bowel function returning on 11/14, diet advanced a G-tube removed has been weaned off TPN.  Assessment/Plan:   SBO (small bowel obstruction) ([place]): Status post exploratory laparotomy has been weaned NG tube. Currently on TNA, try to keep potassium greater than 4 magnesium greater than 2. Abdominal x-ray showed adynamic ileus, further management per surgery. Continue NG tube to intermittent suction, output has decreased about 600 cc. Encourage patient to ambulation much as possible out of bed to chair. Avoid narcotics. Continue IV fluids check electrolytes.  Severe protein caloric malnutrition:  NPO  Chronic systolic heart failure: Appears euvolemic with an EF of 20%. Continue to hold ACE inhibitor blood pressure borderline.  Aortic sclerosis: Continue statin as discharged.  Leukopenia: Now resolved.  Influenza A with a positive PCR 08/13/2021: Complete his course of Tamiflu.  Acute kidney injury: Likely prerenal azotemia resolved with IV fluid hydration.   DVT prophylaxis: lovenox Family Communication:none Status is: Inpatient  Remains inpatient appropriate because: Acute small bowel obstruction      Code Status:     Code Status Orders  (From admission, onward)           Start     Ordered   08/03/21 1527  Full code  Continuous        08/03/21 1530            Code Status History     Date Active Date Inactive Code Status Order ID Comments User Context   05/16/2021 1719 05/17/2021 1656 Full Code [id number]  Robley Fries, MD Inpatient   03/04/2016 0058 03/05/2016 1657 Full Code [id number]  Orlie Dakin, MD ED         IV Access:   Peripheral IV   Procedures and diagnostic studies:   DG Abd 2 Views  Result Date: 08/19/2021 CLINICAL DATA:  Nausea and vomiting for several days, initial encounter EXAM: ABDOMEN - 2 VIEW COMPARISON:  08/16/2021 FINDINGS: Postsurgical changes are again noted. Scattered bowel gas is noted. A few mildly dilated loops of small bowel are noted with air-fluid levels. Minimal free air is noted related to the prior surgery. No new focal abnormality is noted. IMPRESSION: Postoperative free air. Mild small bowel dilatation. Electronically Signed   By: Inez Catalina M.D.   On: 08/19/2021 19:26   DG Abd Portable 1V  Result Date: 08/19/2021 CLINICAL DATA:  Check gastric catheter placement EXAM: PORTABLE ABDOMEN - 1 VIEW COMPARISON:  Film from earlier in the same day. FINDINGS: Gastric catheter is noted within the stomach. Scattered large and small bowel gas is noted. Postsurgical changes are seen. IMPRESSION: Gastric catheter within the stomach. Electronically Signed   By: Inez Catalina M.D.   On: 08/19/2021 19:24     Medical Consultants:   None.   Subjective:    [name] abdominal pain is improved, has not had a bowel movement not passing gas.  Objective:    Vitals:  08/20/21 2003 08/21/21 0406 08/21/21 0500 08/21/21 0740  BP: 128/73 129/71  119/70  Pulse: 83 99  87  Resp: 17 18  18   Temp: 97.9 F (36.6 C) 97.9 F (36.6 C)  97.9 F (36.6 C)  TempSrc:    Oral  SpO2: 100% 98%  100%  Weight:   65 kg   Height:       SpO2: 100 % O2 Flow Rate (L/min): 1 L/min   Intake/Output Summary (Last 24 hours) at 08/21/2021 0840 Last data filed at 08/21/2021 0616 Gross per 24 hour  Intake 2477.54 ml   Output 1250 ml  Net 1227.54 ml    Filed Weights   08/18/21 0352 08/19/21 0356 08/21/21 0500  Weight: 69.8 kg 69.6 kg 65 kg    Exam: General exam: In no acute distress. Respiratory system: Good air movement and clear to auscultation. Cardiovascular system: S1 & S2 heard, RRR. No JVD. Gastrointestinal system: Abdomen is nondistended, soft and nontender.  Extremities: No pedal edema. Skin: No rashes, lesions or ulcers Psychiatry: Judgement and insight appear normal. Mood & affect appropriate.  Data Reviewed:    Labs: Basic Metabolic Panel: Recent Labs  Lab 08/16/21 0309 08/17/21 0436 08/19/21 0054 08/19/21 1030 08/20/21 0533 08/21/21 0607  NA 136 134* 138  --  140 141  K 5.1 4.7 3.9  --  4.4 3.8  CL 101 103 103  --  106 105  CO2 27 24 27   --  29 26  GLUCOSE 132* 164* 194*  --  127* 148*  BUN 23 35* 31*  --  29* 24*  CREATININE 0.68 0.67 0.72  --  0.62 0.59*  CALCIUM 9.0 8.9 9.2  --  8.7* 8.9  MG 2.3 2.4 2.2  --   --  2.1  PHOS 3.4 4.3 3.3 3.9 2.9 3.5    GFR Estimated Creatinine Clearance: 80.1 mL/min (A) (by C-G formula based on SCr of 0.59 mg/dL (L)). Liver Function Tests: Recent Labs  Lab 08/17/21 0436 08/21/21 0607  AST 41 20  ALT 65* 48*  ALKPHOS 57 40  BILITOT 0.4 0.2*  PROT 6.0* 6.0*  ALBUMIN 2.8* 2.8*    No results for input(s): LIPASE, AMYLASE in the last 168 hours. No results for input(s): AMMONIA in the last 168 hours. Coagulation profile No results for input(s): INR, PROTIME in the last 168 hours. COVID-19 Labs  No results for input(s): DDIMER, FERRITIN, LDH, CRP in the last 72 hours.  Lab Results  Component Value Date   SARSCOV2NAA NEGATIVE 08/03/2021   SARSCOV2NAA RESULT: NEGATIVE 05/12/2021    CBC: Recent Labs  Lab 08/16/21 0810  WBC 4.9  HGB 11.3*  HCT 36.1*  MCV 113.5*  PLT 383    Cardiac Enzymes: No results for input(s): CKTOTAL, CKMB, CKMBINDEX, TROPONINI in the last 168 hours. BNP (last 3 results) No results for  input(s): PROBNP in the last 8760 hours. CBG: Recent Labs  Lab 08/19/21 2357 08/20/21 0432 08/20/21 2356 08/21/21 0404 08/21/21 0740  GLUCAP 181* 146* 219* 163* 102*    D-Dimer: No results for input(s): DDIMER in the last 72 hours. Hgb A1c: Recent Labs    08/21/21 0607  HGBA1C 6.1*   Lipid Profile: Recent Labs    08/21/21 0607  TRIG 55    Thyroid function studies: No results for input(s): TSH, T4TOTAL, T3FREE, THYROIDAB in the last 72 hours.  Invalid input(s): FREET3 Anemia work up: No results for input(s): VITAMINB12, FOLATE, FERRITIN, TIBC, IRON, RETICCTPCT in the last 72  hours. Sepsis Labs: Recent Labs  Lab 08/16/21 0810  WBC 4.9    Microbiology No results found for this or any previous visit (from the past 240 hour(s)).   Medications:    bisacodyl  10 mg Rectal Daily   Chlorhexidine Gluconate Cloth  6 each Topical Daily   enoxaparin (LOVENOX) injection  40 mg Subcutaneous Q24H   feeding supplement  237 mL Oral TID BM   influenza vaccine adjuvanted  0.5 mL Intramuscular Tomorrow-1000   lip balm  1 application Topical BID   mouth rinse  15 mL Mouth Rinse BID   sodium chloride flush  10-40 mL Intracatheter Q12H   sodium chloride flush  3 mL Intravenous Q12H   Continuous Infusions:  sodium chloride     lactated ringers     lactated ringers 75 mL/hr at 08/20/21 0935   methocarbamol (ROBAXIN) IV     TPN CYCLIC-ADULT (ION)          LOS: 17 days   Charlynne Cousins  [facility]  08/21/2021, 8:40 AM

## 2021-08-21 NOTE — Progress Notes (Addendum)
Initial Nutrition Assessment  DOCUMENTATION CODES:   Severe malnutrition in context of chronic illness  INTERVENTION:   Advance diet as medically appropriate Encourage good PO intake  Continue TPN per Pharmacy Recommend continuing TPN at goal until pt demonstrates ability to eat 60% of needs via PO.  NUTRITION DIAGNOSIS:   Severe Malnutrition related to chronic illness (CHF) as evidenced by severe muscle depletion, severe fat depletion. - Ongoing  GOAL:   Patient will meet greater than or equal to 90% of their needs - Met via TPN  MONITOR:   Diet advancement, Labs, Weight trends  REASON FOR ASSESSMENT:   NPO/Clear Liquid Diet    ASSESSMENT:   70 y.o. male presented to the ED with lower abdominal pain. PMH includes CHF, GERD, and HTN. Pt admitted with enteritis and an ileus; Flu+ on admission.   11/03: NPO; CLD 11/04: NPO 11/06: NG tube placed for LIS 11/09 - s/p ex-lap with lysis of adhesions 11/11 - TPN start 11/12 - TPN increased to goal rate 11/13 - NG tube clamped, later removed 11/14 - clear liquids, full liquids 11/17 - full liquids, SOFT, full liquids 11/18 - SOFT, full liquids, SOFT 11/19 - Dysphagia 3; NGT to LIWS 11/20 - NPO  Pt transport there to transport pt to CT. Possible ileus again.  There was no evidence of calorie count information with in the pt room. RN reports that the pt has not been eating.   Pt is now back on NPO, recommend continuing TPN to meet 100% of his estimated energy needs until ileus is resolved. PLDN will follow for diet advancement and order ONS for pt.   NG output: 600 mL x 24 hours (11/20-11/21)  Medications reviewed and include: Dulcolax Labs reviewed: Hgb A1c 6.1%  Diet Order:   Diet Order             Diet NPO time specified Except for: Ice Chips  Diet effective now                   EDUCATION NEEDS:   No education needs have been identified at this time  Skin:  Skin Assessment: Skin Integrity  Issues: Skin Integrity Issues:: Incisions Incisions: Abdomen  Last BM:  08/19/2021  Height:   Ht Readings from Last 1 Encounters:  08/11/21 '6\' 1"'  (1.854 m)    Weight:   Wt Readings from Last 1 Encounters:  08/21/21 65 kg    Ideal Body Weight:  83.6 kg  BMI:  Body mass index is 18.91 kg/m.  Estimated Nutritional Needs:   Kcal:  2300-2500  Protein:  115-130 grams  Fluid:  > 2 L    Lasha Echeverria BS, PLDN Clinical Dietitian See AMiON for contact information.

## 2021-08-22 DIAGNOSIS — K9189 Other postprocedural complications and disorders of digestive system: Secondary | ICD-10-CM | POA: Diagnosis not present

## 2021-08-22 DIAGNOSIS — I5022 Chronic systolic (congestive) heart failure: Secondary | ICD-10-CM | POA: Diagnosis not present

## 2021-08-22 DIAGNOSIS — K56609 Unspecified intestinal obstruction, unspecified as to partial versus complete obstruction: Secondary | ICD-10-CM | POA: Diagnosis not present

## 2021-08-22 DIAGNOSIS — N179 Acute kidney failure, unspecified: Secondary | ICD-10-CM | POA: Diagnosis not present

## 2021-08-22 LAB — BASIC METABOLIC PANEL
Anion gap: 7 (ref 5–15)
BUN: 26 mg/dL — ABNORMAL HIGH (ref 8–23)
CO2: 28 mmol/L (ref 22–32)
Calcium: 8.8 mg/dL — ABNORMAL LOW (ref 8.9–10.3)
Chloride: 104 mmol/L (ref 98–111)
Creatinine, Ser: 0.65 mg/dL (ref 0.61–1.24)
GFR, Estimated: >60 mL/min (ref >60)
Glucose, Bld: 122 mg/dL — ABNORMAL HIGH (ref 70–99)
Potassium: 3.8 mmol/L (ref 3.5–5.1)
Sodium: 139 mmol/L (ref 135–145)

## 2021-08-22 LAB — GLUCOSE, CAPILLARY
Glucose-Capillary: 115 mg/dL — ABNORMAL HIGH (ref 70–99)
Glucose-Capillary: 117 mg/dL — ABNORMAL HIGH (ref 70–99)
Glucose-Capillary: 166 mg/dL — ABNORMAL HIGH (ref 70–99)
Glucose-Capillary: 181 mg/dL — ABNORMAL HIGH (ref 70–99)
Glucose-Capillary: 193 mg/dL — ABNORMAL HIGH (ref 70–99)
Glucose-Capillary: 83 mg/dL (ref 70–99)

## 2021-08-22 MED ORDER — TRAVASOL 10 % IV SOLN
INTRAVENOUS | Status: AC
  Filled 2021-08-22: qty 1207.4

## 2021-08-22 MED ORDER — POTASSIUM CHLORIDE 10 MEQ/100ML IV SOLN
10.0000 meq | INTRAVENOUS | Status: AC
  Administered 2021-08-22 (×3): 10 meq via INTRAVENOUS
  Filled 2021-08-22 (×3): qty 100

## 2021-08-22 NOTE — Progress Notes (Signed)
   08/22/21 1629  Mobility  Activity Refused mobility (Just got up with nursing staff per pt)

## 2021-08-22 NOTE — Progress Notes (Signed)
[facility] Progress Note  13 Days Post-Op  Subjective: CC:  Reports his abdomen is feeling tight with some mild discomfort in the left hemiabdomen. Denies nausea or emesis. NG put out 1400 cc/24h. Patient denies flatus or BM. He is up walking with PT.  AFVSS Objective: Vital signs in last 24 hours: Temp:  [97.8 F (36.6 C)-98.3 F (36.8 C)] 98.2 F (36.8 C) (11/22 0752) Pulse Rate:  [79-91] 79 (11/22 0752) Resp:  [17-18] 18 (11/22 0752) BP: (110-128)/(75-86) 120/75 (11/22 0752) SpO2:  [98 %-100 %] 100 % (11/22 0752) Weight:  [66.5 kg] 66.5 kg (11/22 0418) Last BM Date: 08/19/21  Intake/Output from previous day: 11/21 0701 - 11/22 0700 In: 2905 [I.V.:2905] Out: 2350 [Urine:950; Emesis/NG output:1400] Intake/Output this shift: Total I/O In: 0  Out: 150 [Urine:150]  PE: Gen:  Alert, NAD, pleasant Pulm:  Normal effort ORA Abd: Soft, minimally tender around incisions, mild distention, midline incisions c/d/I w/ staples and no erythema or drainage NGT -  feculent,  1400 cc documented last 24 hours  Skin: warm and dry, no rashes  Psych: A&Ox3   Lab Results:  No results for input(s): WBC, HGB, HCT, PLT in the last 72 hours. BMET Recent Labs    08/20/21 0533 08/21/21 0607  NA 140 141  K 4.4 3.8  CL 106 105  CO2 29 26  GLUCOSE 127* 148*  BUN 29* 24*  CREATININE 0.62 0.59*  CALCIUM 8.7* 8.9   PT/INR No results for input(s): LABPROT, INR in the last 72 hours. CMP     Component Value Date/Time   NA 141 08/21/2021 0607   NA 141 11/15/2017 1054   K 3.8 08/21/2021 0607   CL 105 08/21/2021 0607   CO2 26 08/21/2021 0607   GLUCOSE 148 (H) 08/21/2021 0607   BUN 24 (H) 08/21/2021 0607   BUN 13 11/15/2017 1054   CREATININE 0.59 (L) 08/21/2021 0607   CREATININE 0.84 09/28/2020 0956   CALCIUM 8.9 08/21/2021 0607   PROT 6.0 (L) 08/21/2021 0607   PROT 6.5 09/22/2015 0949   ALBUMIN 2.8 (L) 08/21/2021 0607   ALBUMIN 3.9 09/22/2015 0949   AST 20 08/21/2021  0607   ALT 48 (H) 08/21/2021 0607   ALKPHOS 40 08/21/2021 0607   BILITOT 0.2 (L) 08/21/2021 0607   BILITOT 0.8 09/22/2015 0949   GFRNONAA >60 08/21/2021 0607   GFRNONAA 90 09/28/2020 0956   GFRAA 104 09/28/2020 0956   Lipase     Component Value Date/Time   LIPASE 24 08/03/2021 1045       Studies/Results: CT ABDOMEN PELVIS W CONTRAST  Result Date: 08/21/2021 CLINICAL DATA:  Abdominal distension. Postoperative ileus, 12 days postop. EXAM: CT ABDOMEN AND PELVIS WITH CONTRAST TECHNIQUE: Multidetector CT imaging of the abdomen and pelvis was performed using the standard protocol following bolus administration of intravenous contrast. CONTRAST:  OMNIPAQUE IOHEXOL 300 MG/ML  SOLN COMPARISON:  CT AP, 08/03/2021 and 02/22/2019. FINDINGS: Lower chest: Trace LEFT pleural effusion. Minimal bibasilar atelectasis. Miniscule RIGHT basilar pneumothorax. See key image. Hepatobiliary: Multiple hepatic cysts, largest within RIGHT hepatic lobe measuring up to 3.4 cm. Mildly distended gallbladder with radiodense layering debris/stones. No gallbladder wall thickening, or biliary dilatation. Pancreas: Unremarkable. No pancreatic ductal dilatation or surrounding inflammatory changes. Spleen: Normal in size without focal abnormality. Adrenals/Urinary Tract: Adrenal glands are unremarkable. Mild RIGHT hydronephrosis. RIGHT midpole renal cyst. Additional bilateral subcentimeter hypodense renal lesions are too small to adequately characterize though likely small cysts. No renal calculi. Bladder is  decompressed and unremarkable. Stomach/Bowel: *Enteric feeding tube, with tip within stomach. Stomach is within normal limits. *Multiple air-and-fluid distended small bowel loops without discrete transition. Findings appear progressed since 08/03/2021 comparison. *No CT findings of vascular compromise to bowel. *Small volume intraperitoneal air, best seen at the RIGHT upper quadrant. No additional findings to suggest  perforation. *Appendix is not definitively visualized. RIGHT lower quadrant staple line. *Nondilated colon. Vascular/Lymphatic: Ectatic LEFT common iliac artery, measuring up to 1.7 cm. No enlarged abdominopelvic lymph nodes. Reproductive: Prostate is unremarkable. Other: Laparotomy scar with overlying staples. No abdominal wall hernia. No abdominopelvic ascites. Musculoskeletal: No acute osseous findings. IMPRESSION: 1. Multiple air and fluid distended small bowel loops without discrete transition. No evidence of vascular compromise of bowel. Findings appear progressed since 08/03/2021 comparison and are consistent with ileus, though small-bowel obstruction could appear similar. 2. Small volume intra-abdominal gas, at the RIGHT upper quadrant is favored postoperative. No additional findings to suggest bowel perforation. 3. Incidental and senescent findings, as above. Electronically Signed   By: Roanna Banning M.D.   On: 08/21/2021 13:18    Anti-infectives: Anti-infectives (From admission, onward)    Start     Dose/Rate Route Frequency Ordered Stop   08/09/21 1000  ceFAZolin (ANCEF) IVPB 2g/100 mL premix  Status:  Discontinued        2 g 200 mL/hr over 30 Minutes Intravenous To Short Stay 08/09/21 0758 08/10/21 1000   08/04/21 2200  oseltamivir (TAMIFLU) capsule 30 mg        30 mg Oral 2 times daily 08/04/21 0822 08/09/21 0959   08/04/21 1000  oseltamivir (TAMIFLU) capsule 75 mg        75 mg Oral  Once 08/04/21 0825 08/04/21 1116   08/03/21 1645  cefTRIAXone (ROCEPHIN) 2 g in sodium chloride 0.9 % 100 mL IVPB  Status:  Discontinued        2 g 200 mL/hr over 30 Minutes Intravenous Every 24 hours 08/03/21 1631 08/06/21 1519   08/03/21 1645  metroNIDAZOLE (FLAGYL) IVPB 500 mg  Status:  Discontinued        500 mg 100 mL/hr over 60 Minutes Intravenous Every 12 hours 08/03/21 1631 08/06/21 1519        Assessment/Plan  SBO POD 13 s/p ex lap LOA by Dr. Janee Morn 11/9 with findings of dense adhesion  near distal ileum down to the mesentery - afebrile, VSS - NGT initially removed 11/13 but replaced 11/19 due to persistent ileus w/ intolerance of PO.  - CT repeated yesterday 11/22 without abscess or definite transition zone, dilated loops of small bowel with air fluid levels consistent with ileus. - continue PICC and full support TPN  - mobilize/OOB  FEN - NPO, NG to LIWS; TPN, A1c 6/1%, prealbumin 11/19 28.6 ID - ancef periop VTE - SCDs, lovenox Foley - placed periop 11/9, d/c 11/10   AKI HTN HLD COPD CHF   LOS: 18 days    Hosie Spangle, [facility] Please see Amion for pager number during day hours 7:00am-4:30pm

## 2021-08-22 NOTE — Progress Notes (Signed)
Occupational Therapy Treatment Patient Details Name: [name] MRN: [id number] DOB: Feb 26, 1951 Today's Date: 08/22/2021   History of present illness 70 y/o male who presents on 08/03/21 due to abdominal pain and diarrhea. CT abdomen/pelvis- dilated fluid and air-filled small intestine consistent with small bowel obstruction. Per surgery, ilieus vs enteritis vs partial SBO. Exploratory lap 11/9.   PMH includes HTN, chronic systolic CHF- EF 02%, prior history of alcoholism   OT comments  Pt progressing towards established OT goals. Pt performing toileting, grooming, peri care, and functional mobility in hallway with Supervision and RW. Noting pt with slight posterior lean during static standing activities such as oral care or peri care at sink; no LOB. Also noting pt's NG tube slightly out of position; PA arriving during session and repositioning. Continue to recommend dc to home and will continue to follow acutely as admitted.    Recommendations for follow up therapy are one component of a multi-disciplinary discharge planning process, led by the attending physician.  Recommendations may be updated based on patient status, additional functional criteria and insurance authorization.    Follow Up Recommendations  No OT follow up    Assistance Recommended at Discharge Intermittent Supervision/Assistance  Equipment Recommendations  None recommended by OT    Recommendations for Other Services      Precautions / Restrictions Precautions Precautions: Fall Precaution Comments: NG tube       Mobility Bed Mobility Overal bed mobility: Modified Independent Bed Mobility: Supine to Sit;Sit to Supine     Supine to sit: Modified independent (Device/Increase time) Sit to supine: Modified independent (Device/Increase time)        Transfers Overall transfer level: Needs assistance Equipment used: Rolling walker (2 wheels) Transfers: Sit to/from Stand Sit to Stand: Supervision                  Balance Overall balance assessment: Needs assistance Sitting-balance support: No upper extremity supported;Feet supported Sitting balance-Leahy Scale: Good     Standing balance support: No upper extremity supported;During functional activity Standing balance-Leahy Scale: Fair                             ADL either performed or assessed with clinical judgement   ADL Overall ADL's : Needs assistance/impaired     Grooming: Supervision/safety;Wash/dry hands;Wash/dry face;Standing Grooming Details (indicate cue type and reason): Educating on use of cup to sip in     Lower Body Bathing: Supervison/ safety;Sit to/from stand Lower Body Bathing Details (indicate cue type and reason): Using figure four to don socks while seated on EOB     Lower Body Dressing: Supervision/safety;Sit to/from stand Lower Body Dressing Details (indicate cue type and reason): Pt performing sponge bath and peri care at sink with cloe supervison. Noting slight posterior lean at times but no LOB Toilet Transfer: Supervision/safety;Ambulation;Rolling walker (2 wheels);Regular Toilet           Functional mobility during ADLs: Supervision/safety General ADL Comments: Pt performing toileting, grooming, bathing, and mobility in hallway.    Extremity/Trunk Assessment Upper Extremity Assessment Upper Extremity Assessment: Generalized weakness   Lower Extremity Assessment Lower Extremity Assessment: Defer to PT evaluation        Vision       Perception     Praxis      Cognition Arousal/Alertness: Awake/alert Behavior During Therapy: WFL for tasks assessed/performed Overall Cognitive Status: Impaired/Different from baseline Area of Impairment: Problem solving  Problem Solving: Requires verbal cues General Comments: mumbled speech. Pt benefiting from verbal cues for problem solving - such as pt attempting to wash his face with the same  wash clothe that he washed his peri area. Cues to use new, fresh wash cloth.          Exercises     Shoulder Instructions       General Comments Noting pts NG tube slight down upon arrival to room. Notified nursing. PA coming in during session and repositioning. Pt requesting bible. Located and provided bible    Pertinent Vitals/ Pain       Pain Assessment: Faces Faces Pain Scale: Hurts a little bit Pain Location: abdomen Pain Descriptors / Indicators: Discomfort Pain Intervention(s): Monitored during session;Limited activity within patient's tolerance;Repositioned  Home Living                                          Prior Functioning/Environment              Frequency  Min 2X/week        Progress Toward Goals  OT Goals(current goals can now be found in the care plan section)  Progress towards OT goals: Progressing toward goals  Acute Rehab OT Goals Patient Stated Goal: get better OT Goal Formulation: With patient Time For Goal Achievement: 09/05/21 Potential to Achieve Goals: Good ADL Goals Pt Will Perform Grooming: with modified independence;standing Pt Will Perform Lower Body Bathing: with modified independence;sit to/from stand Pt Will Perform Lower Body Dressing: with modified independence;sit to/from stand Pt Will Transfer to Toilet: with modified independence;ambulating;regular height toilet Pt Will Perform Tub/Shower Transfer: Tub transfer;with modified independence;3 in 1;ambulating  Plan Discharge plan remains appropriate    Co-evaluation                 AM-PAC OT "6 Clicks" Daily Activity     Outcome Measure   Help from another person eating meals?: None Help from another person taking care of personal grooming?: A Little Help from another person toileting, which includes using toliet, bedpan, or urinal?: A Little Help from another person bathing (including washing, rinsing, drying)?: A Little Help from another  person to put on and taking off regular upper body clothing?: None Help from another person to put on and taking off regular lower body clothing?: A Little 6 Click Score: 20    End of Session Equipment Utilized During Treatment: Rolling walker (2 wheels)  OT Visit Diagnosis: Unsteadiness on feet (R26.81);History of falling (Z91.81);Pain Pain - part of body:  (Abdomen)   Activity Tolerance Patient tolerated treatment well   Patient Left in bed;with call bell/phone within reach;with bed alarm set   Nurse Communication Mobility status;Other (comment) (NG slightly out of position)        Time: 1610-9604 OT Time Calculation (min): 46 min  Charges: OT General Charges $OT Visit: 1 Visit OT Treatments $Self Care/Home Management : 38-52 mins  Jazlynn Nemetz MSOT, OTR/L Acute Rehab Pager: [phone] Office: [phone]  Theodoro Grist Reynold Mantell 08/22/2021, 9:48 AM

## 2021-08-22 NOTE — Progress Notes (Signed)
PHARMACY - TOTAL PARENTERAL NUTRITION CONSULT NOTE  Indication: Small bowel obstruction, ileus  Patient Measurements: Height: 6\' 1"  (185.4 cm) Weight: 66.5 kg (146 lb 9.7 oz) IBW/kg (Calculated) : 79.9 TPN AdjBW (KG): 70 Body mass index is 19.34 kg/m. Usual Weight: 74 kg  Assessment: 70 y.o. male with PMH significant for HTN, CHF with LVEF ~25%, GERD, BPH, prior history of alcoholism who presented with complaints of lower abdominal pain, found to have SBO. Patient has remained NPO/CLD, now for more than 7 days and at risk of becoming severely malnourished. Pharmacy is consulted to manage TPN.   Per RD's conversation with patient, reported he was eating well PTA. States he would have abdominal pain when eating. Per patient 11/15, he sometimes skips meals at home even though his wife is a good cook because he just doesn't feel like eating.  Glucose / Insulin: no hx DM, A1C 6.1 - CBGs borderline on TPN (115-193), low off TPN (80s). Currently not on SSI Electrolytes: last labs 11/21 - K 3.8 (goal >/=4 with ileus), others stable WNL Renal: SCr < 1 stable, BUN down to 24 Hepatic: ALT mildly elevated - trend down, ALT / TG WNL, Tbili 0.2, albumin 2.8, Prealbumin WNL (11/19) Intake / Output; MIVF: UOP 0.6 ml/kg/hr, NG 05-11-1982, LBM 11/19 (Dulcolax supp ordered daily but patient refusing); MIVF - LR at 75 ml/hr, net +7.9L this admit GI Imaging: 11/3 CT abd: SBO  11/4 Abd XR: small partial SBO 11/6 Abd XR: Persistent SBO  11/7 Abd XR: Persistent SBO  11/8 Abd XR: SBO with interval worsening 11/9 Abd XR: post-op free air, small bowel dilatation 11/21 CT - no abscess or obstruction, consistent with ileus GI Surgeries / Procedures: 11/9 ex-lap with LOA   Central access: PICC placed 08/10/21 TPN start date:  08/11/21  Nutritional Goals: Goal continuous TPN rate is 90 mL/hr (provides 118 g of protein and 2334 kcals per day)  RD Assessment: Estimated Needs Total Energy Estimated Needs:  2300-2500 Total Protein Estimated Needs: 115-130 grams Total Fluid Estimated Needs: > 2 L  Current Nutrition:  Cyclic 12-hr TPN (back to full support 11/20 with NPO status and NGT to LIWS)  Plan:   No change to TPN formula today.  -Cycle TPN over 12 hours (rate 98-196 ml/hr, GIR 3.93-7.86 mg/kg/min) - provides 121g AA, 345g CHO, 65g ILE, and 2302kCal -Electrolytes in TPN: Na 161mEq/L, K 69mEq/L, Ca 5 mEq/L, Mg 60mEq/L, Phos 6mmol/L; Cl:Ac 1:1 -Add back standard daily MVI and trace elements to TPN 11/20 - patient refused PO multivitamin  -Continue custom cyclic 4x daily CBG checks. Monitor off SSI and adjust as needed -D/c LR at 75 ml/hr per discussion with Surgery PA -Monitor TPN labs Mon/Thurs and PRN -F/u General Surgery plans   12/20, PharmD, BCPS Please check AMION for all [facility] contact numbers Clinical Pharmacist 08/22/2021 8:23 AM

## 2021-08-22 NOTE — Progress Notes (Signed)
[facility] PROGRESS NOTE    Progress Note  [name]  [id number] DOB: May 18, 1951 DOA: 08/03/2021 PCP: Lauree Chandler, NP    Brief Narrative:   [name] is an 70 y.o. male essential hypertension chronic systolic heart failure with an EF of 20%, prior history of alcoholism comes in for abdominal pain suspected enteritis and ileitis seen by CT 3 general surgery.  Failed conservative management, he subsequently developed small bowel obstruction underwent exploratory laparotomy on 08/09/2021 which revealed entrapped by dense adhesions small bowel which were lysed, started on TPN.  Bowel function returning on 11/14, diet advanced a G-tube removed has been weaned off TPN.  Assessment/Plan:   SBO (small bowel obstruction) ([place]): Status post exploratory laparotomy. Currently on TNA, try to keep potassium greater than 4 magnesium greater than 2. CT scan of the abdomen and pelvis with contrast showed an ileus surgery recommended to continue conservative management showed no abscess or obstruction. Ambulate patient is much as possible continue NG tube to intermittent suction. Avoid narcotics. Continue IV fluids check electrolytes.  Severe protein caloric malnutrition:  NPO, currently on TNA.  Chronic systolic heart failure: Appears euvolemic with an EF of 20%. Continue to hold ACE inhibitor blood pressure borderline.  Aortic sclerosis: Continue statin as discharged.  Leukopenia: Now resolved.  Influenza A with a positive PCR 08/13/2021: Complete his course of Tamiflu.  Acute kidney injury: Likely prerenal azotemia resolved with IV fluid hydration.   DVT prophylaxis: lovenox Family Communication:none Status is: Inpatient  Remains inpatient appropriate because: Acute small bowel obstruction      Code Status:     Code Status Orders  (From admission, onward)           Start     Ordered   08/03/21 1527  Full code  Continuous        08/03/21  1530           Code Status History     Date Active Date Inactive Code Status Order ID Comments User Context   05/16/2021 1719 05/17/2021 1656 Full Code [id number]  Robley Fries, MD Inpatient   03/04/2016 0058 03/05/2016 1657 Full Code [id number]  Orlie Dakin, MD ED         IV Access:   Peripheral IV   Procedures and diagnostic studies:   CT ABDOMEN PELVIS W CONTRAST  Result Date: 08/21/2021 CLINICAL DATA:  Abdominal distension. Postoperative ileus, 12 days postop. EXAM: CT ABDOMEN AND PELVIS WITH CONTRAST TECHNIQUE: Multidetector CT imaging of the abdomen and pelvis was performed using the standard protocol following bolus administration of intravenous contrast. CONTRAST:  174mL OMNIPAQUE IOHEXOL 300 MG/ML  SOLN COMPARISON:  CT AP, 08/03/2021 and 02/22/2019. FINDINGS: Lower chest: Trace LEFT pleural effusion. Minimal bibasilar atelectasis. Miniscule RIGHT basilar pneumothorax. See key image. Hepatobiliary: Multiple hepatic cysts, largest within RIGHT hepatic lobe measuring up to 3.4 cm. Mildly distended gallbladder with radiodense layering debris/stones. No gallbladder wall thickening, or biliary dilatation. Pancreas: Unremarkable. No pancreatic ductal dilatation or surrounding inflammatory changes. Spleen: Normal in size without focal abnormality. Adrenals/Urinary Tract: Adrenal glands are unremarkable. Mild RIGHT hydronephrosis. RIGHT midpole renal cyst. Additional bilateral subcentimeter hypodense renal lesions are too small to adequately characterize though likely small cysts. No renal calculi. Bladder is decompressed and unremarkable. Stomach/Bowel: *Enteric feeding tube, with tip within stomach. Stomach is within normal limits. *Multiple air-and-fluid distended small bowel loops without discrete transition. Findings appear progressed since 08/03/2021 comparison. *No CT findings of vascular compromise to bowel. *Small volume  intraperitoneal air, best seen at the RIGHT upper  quadrant. No additional findings to suggest perforation. *Appendix is not definitively visualized. RIGHT lower quadrant staple line. *Nondilated colon. Vascular/Lymphatic: Ectatic LEFT common iliac artery, measuring up to 1.7 cm. No enlarged abdominopelvic lymph nodes. Reproductive: Prostate is unremarkable. Other: Laparotomy scar with overlying staples. No abdominal wall hernia. No abdominopelvic ascites. Musculoskeletal: No acute osseous findings. IMPRESSION: 1. Multiple air and fluid distended small bowel loops without discrete transition. No evidence of vascular compromise of bowel. Findings appear progressed since 08/03/2021 comparison and are consistent with ileus, though small-bowel obstruction could appear similar. 2. Small volume intra-abdominal gas, at the RIGHT upper quadrant is favored postoperative. No additional findings to suggest bowel perforation. 3. Incidental and senescent findings, as above. Electronically Signed   By: Michaelle Birks M.D.   On: 08/21/2021 13:18     Medical Consultants:   None.   Subjective:    [name] no complains  Objective:    Vitals:   08/21/21 1605 08/21/21 2000 08/22/21 0418 08/22/21 0752  BP: 116/86 110/85 128/75 120/75  Pulse: 84 80 91 79  Resp: 18 18 17 18   Temp:  97.8 F (36.6 C) 98.3 F (36.8 C) 98.2 F (36.8 C)  TempSrc:  Oral    SpO2: 98% 99% 100% 100%  Weight:   66.5 kg   Height:       SpO2: 100 % O2 Flow Rate (L/min): 1 L/min   Intake/Output Summary (Last 24 hours) at 08/22/2021 1018 Last data filed at 08/22/2021 0823 Gross per 24 hour  Intake 2905.01 ml  Output 2300 ml  Net 605.01 ml    Filed Weights   08/19/21 0356 08/21/21 0500 08/22/21 0418  Weight: 69.6 kg 65 kg 66.5 kg    Exam: General exam: In no acute distress, NG tube in place. Respiratory system: Good air movement and clear to auscultation. Cardiovascular system: S1 & S2 heard, RRR. No JVD. Gastrointestinal system: Abdomen is nondistended, soft and  nontender, no bowel sounds staples in place. Extremities: No pedal edema. Skin: No rashes, lesions or ulcers Psychiatry: Judgement and insight appear normal. Mood & affect appropriate.  Data Reviewed:    Labs: Basic Metabolic Panel: Recent Labs  Lab 08/16/21 0309 08/17/21 0436 08/19/21 0054 08/19/21 1030 08/20/21 0533 08/21/21 0607  NA 136 134* 138  --  140 141  K 5.1 4.7 3.9  --  4.4 3.8  CL 101 103 103  --  106 105  CO2 27 24 27   --  29 26  GLUCOSE 132* 164* 194*  --  127* 148*  BUN 23 35* 31*  --  29* 24*  CREATININE 0.68 0.67 0.72  --  0.62 0.59*  CALCIUM 9.0 8.9 9.2  --  8.7* 8.9  MG 2.3 2.4 2.2  --   --  2.1  PHOS 3.4 4.3 3.3 3.9 2.9 3.5    GFR Estimated Creatinine Clearance: 82 mL/min (A) (by C-G formula based on SCr of 0.59 mg/dL (L)). Liver Function Tests: Recent Labs  Lab 08/17/21 0436 08/21/21 0607  AST 41 20  ALT 65* 48*  ALKPHOS 57 40  BILITOT 0.4 0.2*  PROT 6.0* 6.0*  ALBUMIN 2.8* 2.8*    No results for input(s): LIPASE, AMYLASE in the last 168 hours. No results for input(s): AMMONIA in the last 168 hours. Coagulation profile No results for input(s): INR, PROTIME in the last 168 hours. COVID-19 Labs  No results for input(s): DDIMER, FERRITIN, LDH, CRP in the  last 72 hours.  Lab Results  Component Value Date   SARSCOV2NAA NEGATIVE 08/03/2021   SARSCOV2NAA RESULT: NEGATIVE 05/12/2021    CBC: Recent Labs  Lab 08/16/21 0810  WBC 4.9  HGB 11.3*  HCT 36.1*  MCV 113.5*  PLT 383    Cardiac Enzymes: No results for input(s): CKTOTAL, CKMB, CKMBINDEX, TROPONINI in the last 168 hours. BNP (last 3 results) No results for input(s): PROBNP in the last 8760 hours. CBG: Recent Labs  Lab 08/21/21 1604 08/21/21 2000 08/22/21 0005 08/22/21 0416 08/22/21 0753  GLUCAP 82 183* 193* 166* 115*    D-Dimer: No results for input(s): DDIMER in the last 72 hours. Hgb A1c: Recent Labs    08/21/21 0607  HGBA1C 6.1*    Lipid Profile: Recent  Labs    08/21/21 0607  TRIG 55    Thyroid function studies: No results for input(s): TSH, T4TOTAL, T3FREE, THYROIDAB in the last 72 hours.  Invalid input(s): FREET3 Anemia work up: No results for input(s): VITAMINB12, FOLATE, FERRITIN, TIBC, IRON, RETICCTPCT in the last 72 hours. Sepsis Labs: Recent Labs  Lab 08/16/21 0810  WBC 4.9    Microbiology No results found for this or any previous visit (from the past 240 hour(s)).   Medications:    bisacodyl  10 mg Rectal Daily   Chlorhexidine Gluconate Cloth  6 each Topical Daily   enoxaparin (LOVENOX) injection  40 mg Subcutaneous Q24H   influenza vaccine adjuvanted  0.5 mL Intramuscular Tomorrow-1000   lip balm  1 application Topical BID   mouth rinse  15 mL Mouth Rinse BID   sodium chloride flush  10-40 mL Intracatheter Q12H   sodium chloride flush  3 mL Intravenous Q12H   Continuous Infusions:  sodium chloride     lactated ringers 75 mL/hr at 08/22/21 0403   methocarbamol (ROBAXIN) IV          LOS: 18 days   Marinda Elk  [facility]  08/22/2021, 10:18 AM

## 2021-08-23 ENCOUNTER — Inpatient Hospital Stay (HOSPITAL_COMMUNITY): Payer: Medicare HMO

## 2021-08-23 DIAGNOSIS — K56609 Unspecified intestinal obstruction, unspecified as to partial versus complete obstruction: Secondary | ICD-10-CM | POA: Diagnosis not present

## 2021-08-23 LAB — BASIC METABOLIC PANEL
Anion gap: 6 (ref 5–15)
BUN: 27 mg/dL — ABNORMAL HIGH (ref 8–23)
CO2: 27 mmol/L (ref 22–32)
Calcium: 8.8 mg/dL — ABNORMAL LOW (ref 8.9–10.3)
Chloride: 107 mmol/L (ref 98–111)
Creatinine, Ser: 0.62 mg/dL (ref 0.61–1.24)
GFR, Estimated: >60 mL/min (ref >60)
Glucose, Bld: 163 mg/dL — ABNORMAL HIGH (ref 70–99)
Potassium: 3.6 mmol/L (ref 3.5–5.1)
Sodium: 140 mmol/L (ref 135–145)

## 2021-08-23 LAB — PHOSPHORUS: Phosphorus: 3.9 mg/dL (ref 2.5–4.6)

## 2021-08-23 LAB — GLUCOSE, CAPILLARY
Glucose-Capillary: 122 mg/dL — ABNORMAL HIGH (ref 70–99)
Glucose-Capillary: 167 mg/dL — ABNORMAL HIGH (ref 70–99)
Glucose-Capillary: 83 mg/dL (ref 70–99)
Glucose-Capillary: 83 mg/dL (ref 70–99)
Glucose-Capillary: 85 mg/dL (ref 70–99)

## 2021-08-23 LAB — MAGNESIUM: Magnesium: 2 mg/dL (ref 1.7–2.4)

## 2021-08-23 MED ORDER — TRAVASOL 10 % IV SOLN
INTRAVENOUS | Status: AC
  Filled 2021-08-23: qty 1207.4

## 2021-08-23 MED ORDER — DIATRIZOATE MEGLUMINE & SODIUM 66-10 % PO SOLN
90.0000 mL | Freq: Once | ORAL | Status: AC
  Administered 2021-08-23: 90 mL via NASOGASTRIC
  Filled 2021-08-23: qty 90

## 2021-08-23 MED ORDER — POTASSIUM CHLORIDE 10 MEQ/50ML IV SOLN
10.0000 meq | INTRAVENOUS | Status: AC
  Administered 2021-08-23 (×4): 10 meq via INTRAVENOUS
  Filled 2021-08-23 (×4): qty 50

## 2021-08-23 NOTE — Progress Notes (Addendum)
Went to check on the patient this afternoon. He is no longer having flatus, no BMs today. NG tube has put out 800 cc of feculent material since this morning while I was rounding. He is distended. Will proceed with ordering small bowel protocol (gastrografin per NG tube) to see if this helps bowel function return, and also to confirm this is post-op ileus and not early post-op SBO.   Hosie Spangle, PA-C [facility] Please see Amion for pager number during day hours 7:00am-4:30pm

## 2021-08-23 NOTE — Progress Notes (Signed)
PT Cancellation Note  Patient Details Name: [name] MRN: [id number] DOB: 11-Sep-1951   Cancelled Treatment:    Reason Eval/Treat Not Completed: Patient declined, no reason specified.  Walked with mobility tech and declined including declining bed ex.  Follow up with pt as time allows.   Ivar Drape 08/23/2021, 2:44 PM  Samul Dada, PT PhD Acute Rehab Dept. Number: [facility] [id number] and [facility] [phone]

## 2021-08-23 NOTE — Progress Notes (Signed)
PHARMACY - TOTAL PARENTERAL NUTRITION CONSULT NOTE  Indication: Small bowel obstruction, ileus  Patient Measurements: Height: 6\' 1"  (185.4 cm) Weight: 66.5 kg (146 lb 9.7 oz) IBW/kg (Calculated) : 79.9 TPN AdjBW (KG): 70 Body mass index is 19.34 kg/m. Usual Weight: 74 kg  Assessment: 70 y.o. male with PMH significant for HTN, CHF with LVEF ~25%, GERD, BPH, prior history of alcoholism who presented with complaints of lower abdominal pain, found to have SBO. Patient has remained NPO/CLD, now for more than 7 days and at risk of becoming severely malnourished. Pharmacy is consulted to manage TPN.   Per RD's conversation with patient, reported he was eating well PTA. States he would have abdominal pain when eating. Per patient 11/15, he sometimes skips meals at home even though his wife is a good cook because he just doesn't feel like eating.  Glucose / Insulin: no hx DM, A1c 6.1% - CBGs acceptable on TPN (120-180s) and off TPN (80-110s). Currently not on SSI. Electrolytes: K 3.6 (goal >/= 4 with ileus), others stable WNL Renal: SCr < 1 stable, BUN 27 Hepatic: ALT mildly elevated - trend down, ALT / TG / tbili WNL, albumin 2.8, prealbumin WNL (11/19) Intake / Output; MIVF: UOP 0.4 ml/kg/hr, NG 17mL, LBM 11/22 (Dulcolax supp ordered daily but patient refusing); net +10.4L GI Imaging: 11/3 CT abd: SBO  11/4 Abd XR: small partial SBO 11/6 Abd XR: Persistent SBO  11/7 Abd XR: Persistent SBO  11/8 Abd XR: SBO with interval worsening 11/9 Abd XR: post-op free air, small bowel dilatation 11/21 CT - no abscess or obstruction, consistent with ileus GI Surgeries / Procedures: 11/9 ex-lap with LOA   Central access: PICC placed 08/10/21 TPN start date:  08/11/21  Nutritional Goals: Goal continuous TPN rate is 90 mL/hr (provides 118 g of protein and 2334 kcals per day)  RD Estimated Needs Total Energy Estimated Needs: 2300-2500 Total Protein Estimated Needs: 115-130 grams Total Fluid Estimated  Needs: > 2 L  Current Nutrition:  Cyclic 12-hr TPN (back to full support 11/20 with NPO status and NGT to LIWS)  Plan:   -Cycle TPN over 12 hours (rate 98-196 ml/hr, GIR 3.93-7.86 mg/kg/min) - provides 121g AA, 345g CHO, 65g ILE, and 2302kCal, meeting 100% of needs -Electrolytes in TPN: Na 130mEq/L, increase K slightly to 37mEq/L, Ca 5 mEq/L, Mg 5mEq/L, Phos 7mmol/L; Cl:Ac 1:1 -Add daily MVI and trace elements to TPN  -Continue custom cyclic 4x daily CBG checks. Monitor off SSI and adjust as needed -KCL x 4 runs -Monitor TPN labs Mon/Thurs and PRN  Karene Bracken D. 10m, PharmD, BCPS, BCCCP 08/23/2021, 7:25 AM

## 2021-08-23 NOTE — Progress Notes (Signed)
Patient refused to walk at this time, despite encouragement and explanation.

## 2021-08-23 NOTE — Progress Notes (Signed)
PROGRESS NOTE    [name]  [id number] DOB: 01-13-1951 DOA: 08/03/2021 PCP: Lauree Chandler, NP     Brief Narrative:  [name] is an 70 y.o. male essential hypertension chronic systolic heart failure with an EF of 20%, prior history of alcoholism comes in for abdominal pain suspected enteritis and ileitis seen by CT. Failed conservative management, he subsequently developed small bowel obstruction underwent exploratory laparotomy on 08/09/2021 which revealed entrapped by dense adhesions small bowel which were lysed, started on TPN.  Bowel function returning on 11/14, diet advanced a G-tube removed 11/13 has been weaned off TPN.  However, post op course has been complicated by ileus and NG tube replaced 11/19.  Patient remains on TPN with NG tube to LIS.  New events last 24 hours / Subjective: Patient states that he has been having small amounts of liquid stool.  Denies any abdominal pain.  Assessment & Plan:   Principal Problem:   SBO (small bowel obstruction) (HCC) Active Problems:   Chronic systolic CHF (congestive heart failure) (HCC)   Protein-calorie malnutrition, severe   Aortic atherosclerosis (HCC)   SBO status post ex lap 11/9, postop course complicated by ileus -General surgery managing care -Remains on NG tube and TPN -Continue mobilization  Chronic systolic and diastolic heart failure -Has history of EF 20% and grade 1 diastolic dysfunction via echocardiogram 2017, does not appear fluid overloaded at this time  Influenza A 11/3 -Completed Tamiflu  Hypertension -Home medication lisinopril on hold while n.p.o.  Hyperlipidemia -Home medication Crestor on hold while n.p.o.  CAD -Home medication aspirin on hold while n.p.o.     DVT prophylaxis:  Place and maintain sequential compression device Start: 08/09/21 1228 enoxaparin (LOVENOX) injection 40 mg Start: 08/03/21 1800  Code Status: Full code Family Communication: No family at  bedside Disposition Plan:  Status is: Inpatient  Remains inpatient appropriate because: Continue treatment for SBO and ileus    Antimicrobials:  Anti-infectives (From admission, onward)    Start     Dose/Rate Route Frequency Ordered Stop   08/09/21 1000  ceFAZolin (ANCEF) IVPB 2g/100 mL premix  Status:  Discontinued        2 g 200 mL/hr over 30 Minutes Intravenous To Short Stay 08/09/21 0758 08/10/21 1000   08/04/21 2200  oseltamivir (TAMIFLU) capsule 30 mg        30 mg Oral 2 times daily 08/04/21 0822 08/09/21 0959   08/04/21 1000  oseltamivir (TAMIFLU) capsule 75 mg        75 mg Oral  Once 08/04/21 0825 08/04/21 1116   08/03/21 1645  cefTRIAXone (ROCEPHIN) 2 g in sodium chloride 0.9 % 100 mL IVPB  Status:  Discontinued        2 g 200 mL/hr over 30 Minutes Intravenous Every 24 hours 08/03/21 1631 08/06/21 1519   08/03/21 1645  metroNIDAZOLE (FLAGYL) IVPB 500 mg  Status:  Discontinued        500 mg 100 mL/hr over 60 Minutes Intravenous Every 12 hours 08/03/21 1631 08/06/21 1519        Objective: Vitals:   08/22/21 0752 08/22/21 1611 08/22/21 2141 08/23/21 0528  BP: 120/75 122/77 120/72 118/74  Pulse: 79 80 86 92  Resp: 18 18 17 19   Temp: 98.2 F (36.8 C) 97.8 F (36.6 C) 98.7 F (37.1 C) 98.2 F (36.8 C)  TempSrc:   Oral Oral  SpO2: 100% 100% 100% 99%  Weight:      Height:  Intake/Output Summary (Last 24 hours) at 08/23/2021 1104 Last data filed at 08/23/2021 0841 Gross per 24 hour  Intake 2409.03 ml  Output 1200 ml  Net 1209.03 ml   Filed Weights   08/19/21 0356 08/21/21 0500 08/22/21 0418  Weight: 69.6 kg 65 kg 66.5 kg    Examination:  General exam: Appears calm and comfortable  Respiratory system: Clear to auscultation. Respiratory effort normal. No respiratory distress. No conversational dyspnea.  On room air Cardiovascular system: S1 & S2 heard, RRR. No murmurs. No pedal edema. Gastrointestinal system: Abdomen is mildly distended, nontender to  palpation.  Midline staples present.  NG tube to suction Central nervous system: Alert and oriented. No focal neurological deficits. Speech clear.  Extremities: Symmetric in appearance  Skin: No rashes, lesions or ulcers on exposed skin  Psychiatry: Judgement and insight appear normal. Mood & affect appropriate.   Data Reviewed: I have personally reviewed following labs and imaging studies  CBC: No results for input(s): WBC, NEUTROABS, HGB, HCT, MCV, PLT in the last 168 hours. Basic Metabolic Panel: Recent Labs  Lab 08/17/21 0436 08/19/21 0054 08/19/21 1030 08/20/21 0533 08/21/21 0607 08/22/21 1124 08/23/21 0133  NA 134* 138  --  140 141 139 140  K 4.7 3.9  --  4.4 3.8 3.8 3.6  CL 103 103  --  106 105 104 107  CO2 24 27  --  29 26 28 27   GLUCOSE 164* 194*  --  127* 148* 122* 163*  BUN 35* 31*  --  29* 24* 26* 27*  CREATININE 0.67 0.72  --  0.62 0.59* 0.65 0.62  CALCIUM 8.9 9.2  --  8.7* 8.9 8.8* 8.8*  MG 2.4 2.2  --   --  2.1  --  2.0  PHOS 4.3 3.3 3.9 2.9 3.5  --  3.9   GFR: Estimated Creatinine Clearance: 82 mL/min (by C-G formula based on SCr of 0.62 mg/dL). Liver Function Tests: Recent Labs  Lab 08/17/21 0436 08/21/21 0607  AST 41 20  ALT 65* 48*  ALKPHOS 57 40  BILITOT 0.4 0.2*  PROT 6.0* 6.0*  ALBUMIN 2.8* 2.8*   No results for input(s): LIPASE, AMYLASE in the last 168 hours. No results for input(s): AMMONIA in the last 168 hours. Coagulation Profile: No results for input(s): INR, PROTIME in the last 168 hours. Cardiac Enzymes: No results for input(s): CKTOTAL, CKMB, CKMBINDEX, TROPONINI in the last 168 hours. BNP (last 3 results) No results for input(s): PROBNP in the last 8760 hours. HbA1C: Recent Labs    08/21/21 0607  HGBA1C 6.1*   CBG: Recent Labs  Lab 08/22/21 1611 08/22/21 2139 08/23/21 0009 08/23/21 0534 08/23/21 0813  GLUCAP 83 181* 167* 122* 83   Lipid Profile: Recent Labs    08/21/21 0607  TRIG 55   Thyroid Function  Tests: No results for input(s): TSH, T4TOTAL, FREET4, T3FREE, THYROIDAB in the last 72 hours. Anemia Panel: No results for input(s): VITAMINB12, FOLATE, FERRITIN, TIBC, IRON, RETICCTPCT in the last 72 hours. Sepsis Labs: No results for input(s): PROCALCITON, LATICACIDVEN in the last 168 hours.  No results found for this or any previous visit (from the past 240 hour(s)).    Radiology Studies: CT ABDOMEN PELVIS W CONTRAST  Result Date: 08/21/2021 CLINICAL DATA:  Abdominal distension. Postoperative ileus, 12 days postop. EXAM: CT ABDOMEN AND PELVIS WITH CONTRAST TECHNIQUE: Multidetector CT imaging of the abdomen and pelvis was performed using the standard protocol following bolus administration of intravenous contrast. CONTRAST:  193mL  OMNIPAQUE IOHEXOL 300 MG/ML  SOLN COMPARISON:  CT AP, 08/03/2021 and 02/22/2019. FINDINGS: Lower chest: Trace LEFT pleural effusion. Minimal bibasilar atelectasis. Miniscule RIGHT basilar pneumothorax. See key image. Hepatobiliary: Multiple hepatic cysts, largest within RIGHT hepatic lobe measuring up to 3.4 cm. Mildly distended gallbladder with radiodense layering debris/stones. No gallbladder wall thickening, or biliary dilatation. Pancreas: Unremarkable. No pancreatic ductal dilatation or surrounding inflammatory changes. Spleen: Normal in size without focal abnormality. Adrenals/Urinary Tract: Adrenal glands are unremarkable. Mild RIGHT hydronephrosis. RIGHT midpole renal cyst. Additional bilateral subcentimeter hypodense renal lesions are too small to adequately characterize though likely small cysts. No renal calculi. Bladder is decompressed and unremarkable. Stomach/Bowel: *Enteric feeding tube, with tip within stomach. Stomach is within normal limits. *Multiple air-and-fluid distended small bowel loops without discrete transition. Findings appear progressed since 08/03/2021 comparison. *No CT findings of vascular compromise to bowel. *Small volume intraperitoneal  air, best seen at the RIGHT upper quadrant. No additional findings to suggest perforation. *Appendix is not definitively visualized. RIGHT lower quadrant staple line. *Nondilated colon. Vascular/Lymphatic: Ectatic LEFT common iliac artery, measuring up to 1.7 cm. No enlarged abdominopelvic lymph nodes. Reproductive: Prostate is unremarkable. Other: Laparotomy scar with overlying staples. No abdominal wall hernia. No abdominopelvic ascites. Musculoskeletal: No acute osseous findings. IMPRESSION: 1. Multiple air and fluid distended small bowel loops without discrete transition. No evidence of vascular compromise of bowel. Findings appear progressed since 08/03/2021 comparison and are consistent with ileus, though small-bowel obstruction could appear similar. 2. Small volume intra-abdominal gas, at the RIGHT upper quadrant is favored postoperative. No additional findings to suggest bowel perforation. 3. Incidental and senescent findings, as above. Electronically Signed   By: Roanna Banning M.D.   On: 08/21/2021 13:18   DG Abd Portable 1V  Result Date: 08/23/2021 CLINICAL DATA:  History of small-bowel obstruction EXAM: PORTABLE ABDOMEN - 1 VIEW COMPARISON:  KUB 08/19/2021 FINDINGS: The enteric catheter has been removed. There is gaseous distention of small bowel in the left hemiabdomen with loops measuring up to 5.2 cm, increased since the prior study. There is no definite free intraperitoneal air, suboptimally assessed given supine technique. There is no abnormal soft tissue calcification. Surgical material in the right hemiabdomen is again noted. The bones are unremarkable. IMPRESSION: Increased gaseous distention of the small bowel in the left hemiabdomen may reflect developing obstruction or ileus. Electronically Signed   By: Lesia Hausen M.D.   On: 08/23/2021 08:48      Scheduled Meds:  bisacodyl  10 mg Rectal Daily   Chlorhexidine Gluconate Cloth  6 each Topical Daily   enoxaparin (LOVENOX) injection  40  mg Subcutaneous Q24H   influenza vaccine adjuvanted  0.5 mL Intramuscular Tomorrow-1000   lip balm  1 application Topical BID   mouth rinse  15 mL Mouth Rinse BID   sodium chloride flush  10-40 mL Intracatheter Q12H   sodium chloride flush  3 mL Intravenous Q12H   Continuous Infusions:  sodium chloride     methocarbamol (ROBAXIN) IV     potassium chloride 10 mEq (08/23/21 1042)   TPN CYCLIC-ADULT (ION)       LOS: 19 days      Time spent: 35 minutes   Noralee Stain, DO [facility] 08/23/2021, 11:04 AM   Available via Epic secure chat 7am-7pm After these hours, please refer to coverage provider listed on [email or web address]

## 2021-08-23 NOTE — Progress Notes (Signed)
Mobility Specialist Progress Note:   08/23/21 1100  Mobility  Activity Ambulated in hall  Level of Assistance Contact guard assist, steadying assist  Assistive Device Front wheel walker  Distance Ambulated (ft) 200 ft  Mobility Ambulated with assistance in hallway  Mobility Response Tolerated well  Mobility performed by Mobility specialist  $Mobility charge 1 Mobility   Pt agreed to ambulate in hallway. Distance limited d/t urine frequency. Voided x 2 during session. Pt back in bed with NG tube back on suction.   Addison Lank Mobility Specialist  Phone [phone]

## 2021-08-23 NOTE — Progress Notes (Signed)
[facility] Progress Note  14 Days Post-Op  Subjective: CC:  Reports feeling slightly better, in good spirits this morning. Having large amts of flatus along with some liquid stools, some incontinence of stool. Enjoying sips of sprite. Mobilizing.  AFVSS Objective: Vital signs in last 24 hours: Temp:  [97.8 F (36.6 C)-98.7 F (37.1 C)] 98.2 F (36.8 C) (11/23 0528) Pulse Rate:  [80-92] 92 (11/23 0528) Resp:  [17-19] 19 (11/23 0528) BP: (118-122)/(72-77) 118/74 (11/23 0528) SpO2:  [99 %-100 %] 99 % (11/23 0528) Last BM Date: 08/22/21  Intake/Output from previous day: 11/22 0701 - 11/23 0700 In: 2409 [I.V.:2105.1; IV Piggyback:304] Out: 650 [Urine:650] Intake/Output this shift: Total I/O In: -  Out: 700 [Emesis/NG output:700]  PE: Gen:  Alert, NAD, pleasant Pulm:  Normal effort ORA Abd: Soft, minimally tender around incisions, some distention (slightly improved compared to prior), midline incisions c/d/I w/ staples and no erythema or drainage NGT -  feculent,  700-800 cc last 24 hours  Skin: warm and dry, no rashes  Psych: A&Ox3   Lab Results:  No results for input(s): WBC, HGB, HCT, PLT in the last 72 hours. BMET Recent Labs    08/22/21 1124 08/23/21 0133  NA 139 140  K 3.8 3.6  CL 104 107  CO2 28 27  GLUCOSE 122* 163*  BUN 26* 27*  CREATININE 0.65 0.62  CALCIUM 8.8* 8.8*   PT/INR No results for input(s): LABPROT, INR in the last 72 hours. CMP     Component Value Date/Time   NA 140 08/23/2021 0133   NA 141 11/15/2017 1054   K 3.6 08/23/2021 0133   CL 107 08/23/2021 0133   CO2 27 08/23/2021 0133   GLUCOSE 163 (H) 08/23/2021 0133   BUN 27 (H) 08/23/2021 0133   BUN 13 11/15/2017 1054   CREATININE 0.62 08/23/2021 0133   CREATININE 0.84 09/28/2020 0956   CALCIUM 8.8 (L) 08/23/2021 0133   PROT 6.0 (L) 08/21/2021 0607   PROT 6.5 09/22/2015 0949   ALBUMIN 2.8 (L) 08/21/2021 0607   ALBUMIN 3.9 09/22/2015 0949   AST 20 08/21/2021 0607    ALT 48 (H) 08/21/2021 0607   ALKPHOS 40 08/21/2021 0607   BILITOT 0.2 (L) 08/21/2021 0607   BILITOT 0.8 09/22/2015 0949   GFRNONAA >60 08/23/2021 0133   GFRNONAA 90 09/28/2020 0956   GFRAA 104 09/28/2020 0956   Lipase     Component Value Date/Time   LIPASE 24 08/03/2021 1045       Studies/Results: CT ABDOMEN PELVIS W CONTRAST  Result Date: 08/21/2021 CLINICAL DATA:  Abdominal distension. Postoperative ileus, 12 days postop. EXAM: CT ABDOMEN AND PELVIS WITH CONTRAST TECHNIQUE: Multidetector CT imaging of the abdomen and pelvis was performed using the standard protocol following bolus administration of intravenous contrast. CONTRAST:  OMNIPAQUE IOHEXOL 300 MG/ML  SOLN COMPARISON:  CT AP, 08/03/2021 and 02/22/2019. FINDINGS: Lower chest: Trace LEFT pleural effusion. Minimal bibasilar atelectasis. Miniscule RIGHT basilar pneumothorax. See key image. Hepatobiliary: Multiple hepatic cysts, largest within RIGHT hepatic lobe measuring up to 3.4 cm. Mildly distended gallbladder with radiodense layering debris/stones. No gallbladder wall thickening, or biliary dilatation. Pancreas: Unremarkable. No pancreatic ductal dilatation or surrounding inflammatory changes. Spleen: Normal in size without focal abnormality. Adrenals/Urinary Tract: Adrenal glands are unremarkable. Mild RIGHT hydronephrosis. RIGHT midpole renal cyst. Additional bilateral subcentimeter hypodense renal lesions are too small to adequately characterize though likely small cysts. No renal calculi. Bladder is decompressed and unremarkable. Stomach/Bowel: *Enteric feeding tube, with tip  within stomach. Stomach is within normal limits. *Multiple air-and-fluid distended small bowel loops without discrete transition. Findings appear progressed since 08/03/2021 comparison. *No CT findings of vascular compromise to bowel. *Small volume intraperitoneal air, best seen at the RIGHT upper quadrant. No additional findings to suggest perforation.  *Appendix is not definitively visualized. RIGHT lower quadrant staple line. *Nondilated colon. Vascular/Lymphatic: Ectatic LEFT common iliac artery, measuring up to 1.7 cm. No enlarged abdominopelvic lymph nodes. Reproductive: Prostate is unremarkable. Other: Laparotomy scar with overlying staples. No abdominal wall hernia. No abdominopelvic ascites. Musculoskeletal: No acute osseous findings. IMPRESSION: 1. Multiple air and fluid distended small bowel loops without discrete transition. No evidence of vascular compromise of bowel. Findings appear progressed since 08/03/2021 comparison and are consistent with ileus, though small-bowel obstruction could appear similar. 2. Small volume intra-abdominal gas, at the RIGHT upper quadrant is favored postoperative. No additional findings to suggest bowel perforation. 3. Incidental and senescent findings, as above. Electronically Signed   By: Roanna Banning M.D.   On: 08/21/2021 13:18   DG Abd Portable 1V  Result Date: 08/23/2021 CLINICAL DATA:  History of small-bowel obstruction EXAM: PORTABLE ABDOMEN - 1 VIEW COMPARISON:  KUB 08/19/2021 FINDINGS: The enteric catheter has been removed. There is gaseous distention of small bowel in the left hemiabdomen with loops measuring up to 5.2 cm, increased since the prior study. There is no definite free intraperitoneal air, suboptimally assessed given supine technique. There is no abnormal soft tissue calcification. Surgical material in the right hemiabdomen is again noted. The bones are unremarkable. IMPRESSION: Increased gaseous distention of the small bowel in the left hemiabdomen may reflect developing obstruction or ileus. Electronically Signed   By: Lesia Hausen M.D.   On: 08/23/2021 08:48    Anti-infectives: Anti-infectives (From admission, onward)    Start     Dose/Rate Route Frequency Ordered Stop   08/09/21 1000  ceFAZolin (ANCEF) IVPB 2g/100 mL premix  Status:  Discontinued        2 g 200 mL/hr over 30 Minutes  Intravenous To Short Stay 08/09/21 0758 08/10/21 1000   08/04/21 2200  oseltamivir (TAMIFLU) capsule 30 mg        30 mg Oral 2 times daily 08/04/21 0822 08/09/21 0959   08/04/21 1000  oseltamivir (TAMIFLU) capsule 75 mg        75 mg Oral  Once 08/04/21 0825 08/04/21 1116   08/03/21 1645  cefTRIAXone (ROCEPHIN) 2 g in sodium chloride 0.9 % 100 mL IVPB  Status:  Discontinued        2 g 200 mL/hr over 30 Minutes Intravenous Every 24 hours 08/03/21 1631 08/06/21 1519   08/03/21 1645  metroNIDAZOLE (FLAGYL) IVPB 500 mg  Status:  Discontinued        500 mg 100 mL/hr over 60 Minutes Intravenous Every 12 hours 08/03/21 1631 08/06/21 1519        Assessment/Plan  SBO POD 14 s/p ex lap LOA by Dr. Janee Morn 11/9 with findings of dense adhesion near distal ileum down to the mesentery - afebrile, VSS - NGT initially removed 11/13 but replaced 11/19 due to persistent ileus w/ intolerance of PO.  - CT repeated 11/21 without abscess or definite transition zone, dilated loops of small bowel with air fluid levels consistent with ileus. - KUB this AM with dilated loops of small bowel in left hemiabdomen. Clinically the patient is improving - he feels slightly better, having gas and liquid BMs. continue NG to LIWS and re-check this afternoon. Hopefully will  continue to clinically improve over the next 24 hours and NG tube can be clamped or removed soon. Wound not remove today given amount of distention. - continue PICC and full support TPN  - mobilize/OOB - D/C staples  FEN - NPO, NG to LIWS; TPN, A1c 6/1%, prealbumin 11/19 28.6 ID - ancef periop VTE - SCDs, lovenox Foley - placed periop 11/9, d/c 11/10   AKI HTN HLD COPD CHF   LOS: 19 days    Hosie Spangle, [facility] Please see Amion for pager number during day hours 7:00am-4:30pm

## 2021-08-24 DIAGNOSIS — K56609 Unspecified intestinal obstruction, unspecified as to partial versus complete obstruction: Secondary | ICD-10-CM | POA: Diagnosis not present

## 2021-08-24 LAB — GLUCOSE, CAPILLARY
Glucose-Capillary: 131 mg/dL — ABNORMAL HIGH (ref 70–99)
Glucose-Capillary: 96 mg/dL (ref 70–99)
Glucose-Capillary: 93 mg/dL (ref 70–99)
Glucose-Capillary: 182 mg/dL — ABNORMAL HIGH (ref 70–99)

## 2021-08-24 LAB — COMPREHENSIVE METABOLIC PANEL
ALT: 60 U/L — ABNORMAL HIGH (ref 0–44)
AST: 22 U/L (ref 15–41)
Albumin: 2.8 g/dL — ABNORMAL LOW (ref 3.5–5.0)
Alkaline Phosphatase: 60 U/L (ref 38–126)
Anion gap: 5 (ref 5–15)
BUN: 26 mg/dL — ABNORMAL HIGH (ref 8–23)
CO2: 29 mmol/L (ref 22–32)
Calcium: 8.8 mg/dL — ABNORMAL LOW (ref 8.9–10.3)
Chloride: 106 mmol/L (ref 98–111)
Creatinine, Ser: 0.56 mg/dL — ABNORMAL LOW (ref 0.61–1.24)
GFR, Estimated: >60 mL/min (ref >60)
Glucose, Bld: 171 mg/dL — ABNORMAL HIGH (ref 70–99)
Potassium: 3.7 mmol/L (ref 3.5–5.1)
Sodium: 140 mmol/L (ref 135–145)
Total Bilirubin: 0.4 mg/dL (ref 0.3–1.2)
Total Protein: 6 g/dL — ABNORMAL LOW (ref 6.5–8.1)

## 2021-08-24 LAB — PHOSPHORUS: Phosphorus: 3.6 mg/dL (ref 2.5–4.6)

## 2021-08-24 LAB — PREALBUMIN: Prealbumin: 25.9 mg/dL (ref 18–38)

## 2021-08-24 LAB — MAGNESIUM: Magnesium: 2.1 mg/dL (ref 1.7–2.4)

## 2021-08-24 MED ORDER — TRAVASOL 10 % IV SOLN
INTRAVENOUS | Status: AC
  Filled 2021-08-24: qty 1207.4

## 2021-08-24 MED ORDER — POTASSIUM CHLORIDE 10 MEQ/50ML IV SOLN
10.0000 meq | INTRAVENOUS | Status: AC
  Administered 2021-08-24 (×4): 10 meq via INTRAVENOUS
  Filled 2021-08-24 (×4): qty 50

## 2021-08-24 NOTE — Progress Notes (Signed)
PROGRESS NOTE    [name]  BPZ:[id number] DOB: 08-05-1951 DOA: 08/03/2021 PCP: Sharon Seller, NP     Brief Narrative:  [name] is an 70 y.o. male essential hypertension chronic systolic heart failure with an EF of 20%, prior history of alcoholism comes in for abdominal pain suspected enteritis and ileitis seen by CT. Failed conservative management, he subsequently developed small bowel obstruction underwent exploratory laparotomy on 08/09/2021 which revealed entrapped by dense adhesions small bowel which were lysed, started on TPN.  Bowel function returning on 11/14, diet advanced a G-tube removed 11/13 has been weaned off TPN.  However, post op course has been complicated by ileus and NG tube replaced 11/19.  Patient remains on TPN with NG tube to LIS.  New events last 24 hours / Subjective: His not had a bowel movement this morning, denies passing gas.  Encouraged mobilization  Assessment & Plan:   Principal Problem:   SBO (small bowel obstruction) (HCC) Active Problems:   Chronic systolic CHF (congestive heart failure) (HCC)   Protein-calorie malnutrition, severe   Aortic atherosclerosis (HCC)   SBO status post ex lap 11/9, postop course complicated by ileus -General surgery managing care -Remains on NG tube and TPN -Continue mobilization  Chronic systolic and diastolic heart failure -Has history of EF 20% and grade 1 diastolic dysfunction via echocardiogram 2017, does not appear fluid overloaded at this time  Influenza A 11/3 -Completed Tamiflu  Hypertension -Home medication lisinopril on hold while n.p.o.  Hyperlipidemia -Home medication Crestor on hold while n.p.o.  CAD -Home medication aspirin on hold while n.p.o.     DVT prophylaxis:  Place and maintain sequential compression device Start: 08/09/21 1228 enoxaparin (LOVENOX) injection 40 mg Start: 08/03/21 1800  Code Status: Full code Family Communication: No family at bedside Disposition  Plan:  Status is: Inpatient  Remains inpatient appropriate because: Continue treatment for SBO and ileus    Antimicrobials:  Anti-infectives (From admission, onward)    Start     Dose/Rate Route Frequency Ordered Stop   08/09/21 1000  ceFAZolin (ANCEF) IVPB 2g/100 mL premix  Status:  Discontinued        2 g 200 mL/hr over 30 Minutes Intravenous To Short Stay 08/09/21 0758 08/10/21 1000   08/04/21 2200  oseltamivir (TAMIFLU) capsule 30 mg        30 mg Oral 2 times daily 08/04/21 0822 08/09/21 0959   08/04/21 1000  oseltamivir (TAMIFLU) capsule 75 mg        75 mg Oral  Once 08/04/21 0825 08/04/21 1116   08/03/21 1645  cefTRIAXone (ROCEPHIN) 2 g in sodium chloride 0.9 % 100 mL IVPB  Status:  Discontinued        2 g 200 mL/hr over 30 Minutes Intravenous Every 24 hours 08/03/21 1631 08/06/21 1519   08/03/21 1645  metroNIDAZOLE (FLAGYL) IVPB 500 mg  Status:  Discontinued        500 mg 100 mL/hr over 60 Minutes Intravenous Every 12 hours 08/03/21 1631 08/06/21 1519        Objective: Vitals:   08/23/21 2020 08/24/21 0500 08/24/21 0501 08/24/21 0504  BP: 116/75   119/75  Pulse: 76   87  Resp: 16   18  Temp: 98.6 F (37 C)  99.4 F (37.4 C) 99.4 F (37.4 C)  TempSrc: Oral   Oral  SpO2: 100%   100%  Weight:  64.7 kg    Height:        Intake/Output  Summary (Last 24 hours) at 08/24/2021 1234 Last data filed at 08/24/2021 1200 Gross per 24 hour  Intake 1811.3 ml  Output 2775 ml  Net -963.7 ml    Filed Weights   08/21/21 0500 08/22/21 0418 08/24/21 0500  Weight: 65 kg 66.5 kg 64.7 kg    Examination:  General exam: Appears calm and comfortable  Respiratory system: Clear to auscultation. Respiratory effort normal. No respiratory distress. No conversational dyspnea.  On room air Cardiovascular system: S1 & S2 heard, RRR. No murmurs. No pedal edema. Gastrointestinal system: Abdomen is mildly distended, nontender to palpation.  Midline Steri-Strips present.  NG tube to  suction Central nervous system: Alert and oriented. No focal neurological deficits. Speech clear.  Extremities: Symmetric in appearance  Skin: No rashes, lesions or ulcers on exposed skin  Psychiatry: Judgement and insight appear normal. Mood & affect appropriate.   Data Reviewed: I have personally reviewed following labs and imaging studies  CBC: No results for input(s): WBC, NEUTROABS, HGB, HCT, MCV, PLT in the last 168 hours. Basic Metabolic Panel: Recent Labs  Lab 08/19/21 0054 08/19/21 1030 08/20/21 0533 08/21/21 0607 08/22/21 1124 08/23/21 0133 08/24/21 0346  NA 138  --  140 141 139 140 140  K 3.9  --  4.4 3.8 3.8 3.6 3.7  CL 103  --  106 105 104 107 106  CO2 27  --  29 26 28 27 29   GLUCOSE 194*  --  127* 148* 122* 163* 171*  BUN 31*  --  29* 24* 26* 27* 26*  CREATININE 0.72  --  0.62 0.59* 0.65 0.62 0.56*  CALCIUM 9.2  --  8.7* 8.9 8.8* 8.8* 8.8*  MG 2.2  --   --  2.1  --  2.0 2.1  PHOS 3.3 3.9 2.9 3.5  --  3.9 3.6    GFR: Estimated Creatinine Clearance: 79.8 mL/min (A) (by C-G formula based on SCr of 0.56 mg/dL (L)). Liver Function Tests: Recent Labs  Lab 08/21/21 0607 08/24/21 0346  AST 20 22  ALT 48* 60*  ALKPHOS 40 60  BILITOT 0.2* 0.4  PROT 6.0* 6.0*  ALBUMIN 2.8* 2.8*    No results for input(s): LIPASE, AMYLASE in the last 168 hours. No results for input(s): AMMONIA in the last 168 hours. Coagulation Profile: No results for input(s): INR, PROTIME in the last 168 hours. Cardiac Enzymes: No results for input(s): CKTOTAL, CKMB, CKMBINDEX, TROPONINI in the last 168 hours. BNP (last 3 results) No results for input(s): PROBNP in the last 8760 hours. HbA1C: No results for input(s): HGBA1C in the last 72 hours.  CBG: Recent Labs  Lab 08/23/21 0813 08/23/21 1145 08/23/21 1704 08/24/21 0509 08/24/21 1201  GLUCAP 83 85 83 131* 96    Lipid Profile: No results for input(s): CHOL, HDL, LDLCALC, TRIG, CHOLHDL, LDLDIRECT in the last 72  hours.  Thyroid Function Tests: No results for input(s): TSH, T4TOTAL, FREET4, T3FREE, THYROIDAB in the last 72 hours. Anemia Panel: No results for input(s): VITAMINB12, FOLATE, FERRITIN, TIBC, IRON, RETICCTPCT in the last 72 hours. Sepsis Labs: No results for input(s): PROCALCITON, LATICACIDVEN in the last 168 hours.  No results found for this or any previous visit (from the past 240 hour(s)).    Radiology Studies: DG Abd Portable 1V-Small Bowel Obstruction Protocol-initial, 8 hr delay  Result Date: 08/24/2021 CLINICAL DATA:  Small-bowel obstruction, 8 hour follow-up film EXAM: PORTABLE ABDOMEN - 1 VIEW COMPARISON:  08/23/2021 FINDINGS: Gastric catheter is seen in the stomach. Scattered large  and small bowel gas is noted. Previously administered contrast now lies within the dilated small bowel loops. No colonic contrast is seen. 24 hour film is recommended. IMPRESSION: Contrast scattered throughout the small bowel. 24 hour follow-up film is recommended. Electronically Signed   By: Inez Catalina M.D.   On: 08/24/2021 00:09   DG Abd Portable 1V  Result Date: 08/23/2021 CLINICAL DATA:  History of small-bowel obstruction EXAM: PORTABLE ABDOMEN - 1 VIEW COMPARISON:  KUB 08/19/2021 FINDINGS: The enteric catheter has been removed. There is gaseous distention of small bowel in the left hemiabdomen with loops measuring up to 5.2 cm, increased since the prior study. There is no definite free intraperitoneal air, suboptimally assessed given supine technique. There is no abnormal soft tissue calcification. Surgical material in the right hemiabdomen is again noted. The bones are unremarkable. IMPRESSION: Increased gaseous distention of the small bowel in the left hemiabdomen may reflect developing obstruction or ileus. Electronically Signed   By: Valetta Mole M.D.   On: 08/23/2021 08:48      Scheduled Meds:  bisacodyl  10 mg Rectal Daily   Chlorhexidine Gluconate Cloth  6 each Topical Daily    enoxaparin (LOVENOX) injection  40 mg Subcutaneous Q24H   lip balm  1 application Topical BID   mouth rinse  15 mL Mouth Rinse BID   sodium chloride flush  10-40 mL Intracatheter Q12H   sodium chloride flush  3 mL Intravenous Q12H   Continuous Infusions:  sodium chloride     methocarbamol (ROBAXIN) IV     TPN CYCLIC-ADULT (ION)       LOS: 20 days      Time spent: 25 minutes   Dessa Phi, DO [facility] 08/24/2021, 12:34 PM   Available via Epic secure chat 7am-7pm After these hours, please refer to coverage provider listed on [email or web address]

## 2021-08-24 NOTE — Progress Notes (Signed)
Patient ID: [name], male   DOB: 1950-10-22, 70 y.o.   MRN: [id number]   Acute Care Surgery Service Progress Note:    Chief Complaint/Subjective: No bm Denies flatus Feels less bloated States he had about 2.5 cups of water yesterday NG - 2500cc/24hrs  Objective: Vital signs in last 24 hours: Temp:  [98.3 F (36.8 C)-99.4 F (37.4 C)] 99.4 F (37.4 C) (11/24 0504) Pulse Rate:  [76-87] 87 (11/24 0504) Resp:  [16-18] 18 (11/24 0504) BP: (115-119)/(71-75) 119/75 (11/24 0504) SpO2:  [100 %] 100 % (11/24 0504) Weight:  [64.7 kg] 64.7 kg (11/24 0500) Last BM Date: 08/23/21  Intake/Output from previous day: 11/23 0701 - 11/24 0700 In: 1611.3 [I.V.:1611.3] Out: 3175 [Urine:675; Emesis/NG output:2500] Intake/Output this shift: No intake/output data recorded.  Lungs: cta, nonlabored  Cardiovascular: reg  Abd: soft, incision healed, distended- perhaps less than yesterday, min to nontender  Extremities: no edema, +SCDs  Neuro: alert, nonfocal  Lab Results: CBC  No results for input(s): WBC, HGB, HCT, PLT in the last 72 hours. BMET Recent Labs    08/23/21 0133 08/24/21 0346  NA 140 140  K 3.6 3.7  CL 107 106  CO2 27 29  GLUCOSE 163* 171*  BUN 27* 26*  CREATININE 0.62 0.56*  CALCIUM 8.8* 8.8*   LFT Hepatic Function Latest Ref Rng & Units 08/24/2021 08/21/2021 08/17/2021  Total Protein 6.5 - 8.1 g/dL 6.0(L) 6.0(L) 6.0(L)  Albumin 3.5 - 5.0 g/dL 2.8(L) 2.8(L) 2.8(L)  AST 15 - 41 U/L 22 20 41  ALT 0 - 44 U/L 60(H) 48(H) 65(H)  Alk Phosphatase 38 - 126 U/L 60 40 57  Total Bilirubin 0.3 - 1.2 mg/dL 0.4 0.2(L) 0.4  Bilirubin, Direct 0.0 - 0.2 mg/dL - - -   PT/INR No results for input(s): LABPROT, INR in the last 72 hours. ABG No results for input(s): PHART, HCO3 in the last 72 hours.  Invalid input(s): PCO2, PO2  Studies/Results:  Anti-infectives: Anti-infectives (From admission, onward)    Start     Dose/Rate Route Frequency Ordered Stop   08/09/21  1000  ceFAZolin (ANCEF) IVPB 2g/100 mL premix  Status:  Discontinued        2 g 200 mL/hr over 30 Minutes Intravenous To Short Stay 08/09/21 0758 08/10/21 1000   08/04/21 2200  oseltamivir (TAMIFLU) capsule 30 mg        30 mg Oral 2 times daily 08/04/21 0822 08/09/21 0959   08/04/21 1000  oseltamivir (TAMIFLU) capsule 75 mg        75 mg Oral  Once 08/04/21 0825 08/04/21 1116   08/03/21 1645  cefTRIAXone (ROCEPHIN) 2 g in sodium chloride 0.9 % 100 mL IVPB  Status:  Discontinued        2 g 200 mL/hr over 30 Minutes Intravenous Every 24 hours 08/03/21 1631 08/06/21 1519   08/03/21 1645  metroNIDAZOLE (FLAGYL) IVPB 500 mg  Status:  Discontinued        500 mg 100 mL/hr over 60 Minutes Intravenous Every 12 hours 08/03/21 1631 08/06/21 1519       Medications: Scheduled Meds:  bisacodyl  10 mg Rectal Daily   Chlorhexidine Gluconate Cloth  6 each Topical Daily   enoxaparin (LOVENOX) injection  40 mg Subcutaneous Q24H   lip balm  1 application Topical BID   mouth rinse  15 mL Mouth Rinse BID   sodium chloride flush  10-40 mL Intracatheter Q12H   sodium chloride flush  3 mL Intravenous Q12H  Continuous Infusions:  sodium chloride     methocarbamol (ROBAXIN) IV     potassium chloride 10 mEq (47/09/29 5747)   TPN CYCLIC-ADULT (ION)     PRN Meds:.sodium chloride, acetaminophen, albuterol, alum & mag hydroxide-simeth, bisacodyl, enalaprilat, hydrALAZINE, magic mouthwash, menthol-cetylpyridinium, methocarbamol (ROBAXIN) IV **OR** methocarbamol, metoCLOPramide (REGLAN) injection, metoprolol tartrate, morphine injection, ondansetron **OR** ondansetron (ZOFRAN) IV, phenol, prochlorperazine, simethicone, sodium chloride flush, sodium chloride flush  Assessment/Plan: Patient Active Problem List   Diagnosis Date Noted   SBO (small bowel obstruction) ([place]) 08/12/2021   Aortic atherosclerosis ([place]) 08/12/2021   Protein-calorie malnutrition, severe 08/10/2021   Enteritis 08/03/2021   BPH with  obstruction/lower urinary tract symptoms 05/16/2021   Wrist pain, acute, left 03/21/2021   Lumbar radiculopathy, right 01/03/2018   Right hip pain 12/03/2017   Benign prostatic hyperplasia with weak urinary stream 06/07/2017   Essential hypertension 04/26/2017   Seasonal allergies 05/25/2016   Severe major depression, single episode, without psychotic features ([place]) 03/04/2016   Alcohol use disorder, severe, dependence ([place]) 03/04/2016   Cocaine use disorder, moderate, dependence ([place]) 03/04/2016   NICM (nonischemic cardiomyopathy) ([place]) 34/11/7094   Chronic systolic CHF (congestive heart failure) ([place]) 01/23/2016   Epigastric abdominal pain 43/83/8184   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   Diarrhea 06/13/2011   DERMATOPHYTOSIS OF [place] AND [place] 03/18/2008   TOBACCO USER 01/02/2008   Alcohol abuse 11/28/2006   SBO POD 14 s/p ex lap LOA by Dr. Grandville Silos 11/9 with findings of dense adhesion near distal ileum down to the mesentery - afebrile, VSS - NGT initially removed 11/13 but replaced 11/19 due to persistent ileus w/ intolerance of PO.  - CT repeated 11/21 without abscess or definite transition zone, dilated loops of small bowel with air fluid levels consistent with ileus. - KUB overnight showed contrast in SB, dilated loops of SB - continue PICC and full support TPN  - mobilize/OOB - D/C staples   FEN - NPO, NG to LIWS; TPN, A1c 6/1%, prealbumin 11/19 28.6 ID - ancef periop VTE - SCDs, lovenox Foley - placed periop 11/9, d/c 11/10   AKI - resolved HTN HLD COPD CHF   LOS: 19  Disposition: cont bowel rest, ok for ice chips/sips of water/sips of soda for comfort, TPN, will repeat plain film in am. We are hopeful that gastrograffin will help ileus resolve. Ambulate.    LOS: 20 days    [name]. Redmond Pulling, MD, FACS General, Bariatric, & Minimally Invasive Surgery [phone] [facility]

## 2021-08-24 NOTE — Progress Notes (Signed)
PHARMACY - TOTAL PARENTERAL NUTRITION CONSULT NOTE  Indication: Small bowel obstruction, ileus  Patient Measurements: Height: 6\' 1"  (185.4 cm) Weight: 64.7 kg (142 lb 10.2 oz) IBW/kg (Calculated) : 79.9 TPN AdjBW (KG): 70 Body mass index is 18.82 kg/m. Usual Weight: 74 kg  Assessment: 70 y.o. male with PMH significant for HTN, CHF with LVEF ~25%, GERD, BPH, prior history of alcoholism who presented with complaints of lower abdominal pain, found to have SBO. Patient has remained NPO/CLD, now for more than 7 days and at risk of becoming severely malnourished. Pharmacy is consulted to manage TPN.   Per RD's conversation with patient, reported he was eating well PTA. States he would have abdominal pain when eating. Per patient 11/15, he sometimes skips meals at home even though his wife is a good cook because he just doesn't feel like eating.  Glucose / Insulin: no hx DM, A1c 6.1% - CBGs acceptable on and off TPN. Currently not on SSI. Electrolytes: K 3.7 post 4 runs (goal >/= 4 with ileus), others stable WNL Renal: SCr < 1 stable, BUN 27 Hepatic: ALT mildly elevated - trend down, ALT / TG / tbili WNL, albumin 2.8, prealbumin WNL (11/24) Intake / Output; MIVF: UOP 0.4 ml/kg/hr, NG 11-21-1977, LBM 11/22 (Dulcolax supp ordered daily but patient refusing some doses); net +7.7L (down) GI Imaging: 11/3 CT abd: SBO  11/4 Abd XR: small partial SBO 11/6 Abd XR: Persistent SBO  11/7 Abd XR: Persistent SBO  11/8 Abd XR: SBO with interval worsening 11/9 Abd XR: post-op free air, small bowel dilatation 11/21 CT - no abscess or obstruction, consistent with ileus 11/23 Abd XR: incr gaseous distention, may reflect developing obstruction or ileus  GI Surgeries / Procedures:  11/9 ex-lap with LOA   Central access: PICC placed 08/10/21 TPN start date:  08/11/21  Nutritional Goals: Goal continuous TPN rate is 90 mL/hr (provides 118 g of protein and 2334 kcals per day)  RD Estimated Needs Total Energy  Estimated Needs: 2300-2500 Total Protein Estimated Needs: 115-130 grams Total Fluid Estimated Needs: > 2 L  Current Nutrition:  Cyclic 12-hr TPN (back to full support 11/20 with NPO status and NGT to LIWS)  Plan:   -Cycle TPN over 12 hours (rate 98-196 ml/hr, GIR 3.93-7.86 mg/kg/min) - provides 121g AA, 345g CHO, 65g ILE, and 2302kCal, meeting 100% of needs -Electrolytes in TPN: Na 177mEq/L, increase K to 37mEq/L, Ca 3mEq/L, Mg 54mEq/L, Phos 37mmol/L; Cl:Ac 1:1 -Add daily MVI and trace elements to TPN  -Continue custom cyclic 4x daily CBG checks. Monitor off SSI and adjust as needed -Repeat KCL x 4 runs -Monitor TPN labs Mon/Thurs; BMET in AM  [name] D. 10m, PharmD, BCPS, BCCCP 08/24/2021, 7:44 AM

## 2021-08-24 NOTE — Progress Notes (Signed)
Mobility Specialist Progress Note   08/24/21 1321  Mobility  Activity Ambulated in hall  Level of Assistance Standby assist, set-up cues, supervision of patient - no hands on  Assistive Device  (IV Pole)  Distance Ambulated (ft) 310 ft  Mobility Ambulated independently in hallway  Mobility Response Tolerated well  Mobility performed by Mobility specialist  $Mobility charge 1 Mobility   Received pt in bed having no complaints and agreeable to mobility. Pt had urge to use BR before session, unsuccessful BM. Asymptomatic throughout ambulation, returned back to bed w/ call bell by side and all needs met.  Holland Falling Mobility Specialist Phone Number [phone]

## 2021-08-25 ENCOUNTER — Inpatient Hospital Stay (HOSPITAL_COMMUNITY): Payer: Medicare HMO

## 2021-08-25 DIAGNOSIS — K56609 Unspecified intestinal obstruction, unspecified as to partial versus complete obstruction: Secondary | ICD-10-CM | POA: Diagnosis not present

## 2021-08-25 LAB — GLUCOSE, CAPILLARY
Glucose-Capillary: 105 mg/dL — ABNORMAL HIGH (ref 70–99)
Glucose-Capillary: 139 mg/dL — ABNORMAL HIGH (ref 70–99)
Glucose-Capillary: 151 mg/dL — ABNORMAL HIGH (ref 70–99)
Glucose-Capillary: 98 mg/dL (ref 70–99)

## 2021-08-25 LAB — BASIC METABOLIC PANEL
Anion gap: 9 (ref 5–15)
BUN: 29 mg/dL — ABNORMAL HIGH (ref 8–23)
CO2: 25 mmol/L (ref 22–32)
Calcium: 8.7 mg/dL — ABNORMAL LOW (ref 8.9–10.3)
Chloride: 108 mmol/L (ref 98–111)
Creatinine, Ser: 0.59 mg/dL — ABNORMAL LOW (ref 0.61–1.24)
GFR, Estimated: >60 mL/min (ref >60)
Glucose, Bld: 150 mg/dL — ABNORMAL HIGH (ref 70–99)
Potassium: 4 mmol/L (ref 3.5–5.1)
Sodium: 142 mmol/L (ref 135–145)

## 2021-08-25 MED ORDER — TRAVASOL 10 % IV SOLN
INTRAVENOUS | Status: AC
  Filled 2021-08-25: qty 1207.4

## 2021-08-25 NOTE — Progress Notes (Signed)
PROGRESS NOTE    ZAEEM KANDEL  ION:[id number] DOB: [birth year] DOA: 08/03/2021 PCP: Sharon Seller, NP     Brief Narrative:  [name] is an 70 y.o. male essential hypertension chronic systolic heart failure with an EF of 20%, prior history of alcoholism comes in for abdominal pain suspected enteritis and ileitis seen by CT. Failed conservative management, he subsequently developed small bowel obstruction underwent exploratory laparotomy on 08/09/2021 which revealed entrapped by dense adhesions small bowel which were lysed, started on TPN.  Bowel function returning on 11/14, diet advanced a G-tube removed 11/13 has been weaned off TPN.  However, post op course has been complicated by ileus and NG tube replaced 11/19.  Patient remains on TPN with NG tube to LIS.  New events last 24 hours / Subjective: His NG tube has been clamped this morning.  States that he is motivated to walk up and down the hall, does not like to sit in a chair  Assessment & Plan:   Principal Problem:   SBO (small bowel obstruction) (HCC) Active Problems:   Chronic systolic CHF (congestive heart failure) (HCC)   Protein-calorie malnutrition, severe   Aortic atherosclerosis (HCC)   SBO status post ex lap 11/9, postop course complicated by ileus -General surgery managing care -Remains on NG tube (clamping trial today) and TPN -Continue mobilization  Chronic systolic and diastolic heart failure -Has history of EF 20% and grade 1 diastolic dysfunction via echocardiogram 2017, does not appear fluid overloaded at this time  Influenza A 11/3 -Completed Tamiflu  Hypertension -Home medication lisinopril on hold while n.p.o.  Hyperlipidemia -Home medication Crestor on hold while n.p.o.  CAD -Home medication aspirin on hold while n.p.o.     DVT prophylaxis:  Place and maintain sequential compression device Start: 08/09/21 1228 enoxaparin (LOVENOX) injection 40 mg Start: 08/03/21 1800  Code  Status: Full code Family Communication: No family at bedside Disposition Plan:  Status is: Inpatient  Remains inpatient appropriate because: Remains on TPN  Antimicrobials:  Anti-infectives (From admission, onward)    Start     Dose/Rate Route Frequency Ordered Stop   08/09/21 1000  ceFAZolin (ANCEF) IVPB 2g/100 mL premix  Status:  Discontinued        2 g 200 mL/hr over 30 Minutes Intravenous To Short Stay 08/09/21 0758 08/10/21 1000   08/04/21 2200  oseltamivir (TAMIFLU) capsule 30 mg        30 mg Oral 2 times daily 08/04/21 0822 08/09/21 0959   08/04/21 1000  oseltamivir (TAMIFLU) capsule 75 mg        75 mg Oral  Once 08/04/21 0825 08/04/21 1116   08/03/21 1645  cefTRIAXone (ROCEPHIN) 2 g in sodium chloride 0.9 % 100 mL IVPB  Status:  Discontinued        2 g 200 mL/hr over 30 Minutes Intravenous Every 24 hours 08/03/21 1631 08/06/21 1519   08/03/21 1645  metroNIDAZOLE (FLAGYL) IVPB 500 mg  Status:  Discontinued        500 mg 100 mL/hr over 60 Minutes Intravenous Every 12 hours 08/03/21 1631 08/06/21 1519        Objective: Vitals:   08/24/21 2035 08/25/21 0359 08/25/21 0456 08/25/21 0944  BP: 118/73 116/73  114/80  Pulse: 79 86  76  Resp: 17 17  16   Temp: 98.1 F (36.7 C) 98.2 F (36.8 C)  97.6 F (36.4 C)  TempSrc: Oral Oral    SpO2: 98% 100%  100%  Weight:  64.5 kg   Height:        Intake/Output Summary (Last 24 hours) at 08/25/2021 1247 Last data filed at 08/25/2021 0500 Gross per 24 hour  Intake 632.11 ml  Output 1600 ml  Net -967.89 ml    Filed Weights   08/22/21 0418 08/24/21 0500 08/25/21 0456  Weight: 66.5 kg 64.7 kg 64.5 kg    Examination:  General exam: Appears calm and comfortable  Respiratory system: Clear to auscultation. Respiratory effort normal. No respiratory distress. No conversational dyspnea.  On room air Cardiovascular system: S1 & S2 heard, RRR. No murmurs. No pedal edema. Gastrointestinal system: Abdomen is mildly distended,  nontender to palpation.  Midline Steri-Strips present.  NG tube clamped today Central nervous system: Alert and oriented. No focal neurological deficits. Speech clear.  Extremities: Symmetric in appearance  Skin: No rashes, lesions or ulcers on exposed skin  Psychiatry: Judgement and insight appear normal. Mood & affect appropriate.   Data Reviewed: I have personally reviewed following labs and imaging studies  CBC: No results for input(s): WBC, NEUTROABS, HGB, HCT, MCV, PLT in the last 168 hours. Basic Metabolic Panel: Recent Labs  Lab 08/19/21 0054 08/19/21 1030 08/20/21 0533 08/21/21 0607 08/22/21 1124 08/23/21 0133 08/24/21 0346 08/25/21 0349  NA 138  --  140 141 139 140 140 142  K 3.9  --  4.4 3.8 3.8 3.6 3.7 4.0  CL 103  --  106 105 104 107 106 108  CO2 27  --  29 26 28 27 29 25   GLUCOSE 194*  --  127* 148* 122* 163* 171* 150*  BUN 31*  --  29* 24* 26* 27* 26* 29*  CREATININE 0.72  --  0.62 0.59* 0.65 0.62 0.56* 0.59*  CALCIUM 9.2  --  8.7* 8.9 8.8* 8.8* 8.8* 8.7*  MG 2.2  --   --  2.1  --  2.0 2.1  --   PHOS 3.3 3.9 2.9 3.5  --  3.9 3.6  --     GFR: Estimated Creatinine Clearance: 79.5 mL/min (A) (by C-G formula based on SCr of 0.59 mg/dL (L)). Liver Function Tests: Recent Labs  Lab 08/21/21 0607 08/24/21 0346  AST 20 22  ALT 48* 60*  ALKPHOS 40 60  BILITOT 0.2* 0.4  PROT 6.0* 6.0*  ALBUMIN 2.8* 2.8*    No results for input(s): LIPASE, AMYLASE in the last 168 hours. No results for input(s): AMMONIA in the last 168 hours. Coagulation Profile: No results for input(s): INR, PROTIME in the last 168 hours. Cardiac Enzymes: No results for input(s): CKTOTAL, CKMB, CKMBINDEX, TROPONINI in the last 168 hours. BNP (last 3 results) No results for input(s): PROBNP in the last 8760 hours. HbA1C: No results for input(s): HGBA1C in the last 72 hours.  CBG: Recent Labs  Lab 08/24/21 2033 08/25/21 0014 08/25/21 0357 08/25/21 0658 08/25/21 1235  GLUCAP 182*  151* 139* 98 105*    Lipid Profile: No results for input(s): CHOL, HDL, LDLCALC, TRIG, CHOLHDL, LDLDIRECT in the last 72 hours.  Thyroid Function Tests: No results for input(s): TSH, T4TOTAL, FREET4, T3FREE, THYROIDAB in the last 72 hours. Anemia Panel: No results for input(s): VITAMINB12, FOLATE, FERRITIN, TIBC, IRON, RETICCTPCT in the last 72 hours. Sepsis Labs: No results for input(s): PROCALCITON, LATICACIDVEN in the last 168 hours.  No results found for this or any previous visit (from the past 240 hour(s)).    Radiology Studies: DG Abd Portable 1V  Result Date: 08/25/2021 CLINICAL DATA:  Ileus, 24  hour delay EXAM: PORTABLE ABDOMEN - 1 VIEW COMPARISON:  KUB 08/23/2021 FINDINGS: Enteric catheter is partially imaged with the tip projecting over the stomach. Gaseous distention of small bowel in the left hemiabdomen is similar to the prior study, with loops measuring up to 5.0 cm. Enteric contrast is seen in the right colon. There is no abnormal soft tissue calcification. There is no acute osseous abnormality. IMPRESSION: 1. Enteric contrast now seen in the colon. 2. No significant interval change in gaseous distention of small bowel in the left hemiabdomen. Electronically Signed   By: Valetta Mole M.D.   On: 08/25/2021 08:13   DG Abd Portable 1V-Small Bowel Obstruction Protocol-initial, 8 hr delay  Result Date: 08/24/2021 CLINICAL DATA:  Small-bowel obstruction, 8 hour follow-up film EXAM: PORTABLE ABDOMEN - 1 VIEW COMPARISON:  08/23/2021 FINDINGS: Gastric catheter is seen in the stomach. Scattered large and small bowel gas is noted. Previously administered contrast now lies within the dilated small bowel loops. No colonic contrast is seen. 24 hour film is recommended. IMPRESSION: Contrast scattered throughout the small bowel. 24 hour follow-up film is recommended. Electronically Signed   By: Inez Catalina M.D.   On: 08/24/2021 00:09      Scheduled Meds:  bisacodyl  10 mg Rectal Daily    Chlorhexidine Gluconate Cloth  6 each Topical Daily   enoxaparin (LOVENOX) injection  40 mg Subcutaneous Q24H   lip balm  1 application Topical BID   mouth rinse  15 mL Mouth Rinse BID   sodium chloride flush  10-40 mL Intracatheter Q12H   sodium chloride flush  3 mL Intravenous Q12H   Continuous Infusions:  sodium chloride     methocarbamol (ROBAXIN) IV     TPN CYCLIC-ADULT (ION)       LOS: 21 days      Time spent: 25 minutes   Dessa Phi, DO [facility] 08/25/2021, 12:47 PM   Available via Epic secure chat 7am-7pm After these hours, please refer to coverage provider listed on [email or web address]

## 2021-08-25 NOTE — Progress Notes (Signed)
Mobility Specialist Progress Note:   08/25/21 1000  Mobility  Activity Ambulated in hall  Level of Assistance Contact guard assist, steadying assist  Assistive Device Other (Comment) (IV Pole)  Distance Ambulated (ft) 570 ft  Mobility Ambulated independently in hallway;Out of bed for toileting  Mobility Response Tolerated well  Mobility performed by Mobility specialist  Bed Position Chair  $Mobility charge 1 Mobility   Pt asx during ambulation. Voided in BR x1 before ambulation, wore depends during. Pt back in chair. Will f/u to get pt back to bed.   Addison Lank Mobility Specialist  Phone [phone]

## 2021-08-25 NOTE — Progress Notes (Signed)
Patient ID: [name], male   DOB: [birth year], 70 y.o.   MRN: [id number]   Acute Care Surgery Service Progress Note:    Chief Complaint/Subjective: No bm Denies flatus Wants sprite NG - 1400cc/24hrs  Objective: Vital signs in last 24 hours: Temp:  [98.1 F (36.7 C)-98.6 F (37 C)] 98.2 F (36.8 C) (11/25 0359) Pulse Rate:  [77-86] 86 (11/25 0359) Resp:  [17-18] 17 (11/25 0359) BP: (116-118)/(73) 116/73 (11/25 0359) SpO2:  [98 %-100 %] 100 % (11/25 0359) Weight:  [64.5 kg] 64.5 kg (11/25 0456) Last BM Date: 08/23/21  Intake/Output from previous day: 11/24 0701 - 11/25 0700 In: 832.1 [P.O.:50; I.V.:582.1; IV Piggyback:200] Out: 2100 [Urine:650; Emesis/NG output:1450] Intake/Output this shift: No intake/output data recorded.  Lungs: cta, nonlabored  Cardiovascular: reg  Abd: soft, incision healed, distended- perhaps less than yesterday, min to nontender  Extremities: no edema, +SCDs  Neuro: alert, nonfocal  Lab Results: CBC  No results for input(s): WBC, HGB, HCT, PLT in the last 72 hours. BMET Recent Labs    08/24/21 0346 08/25/21 0349  NA 140 142  K 3.7 4.0  CL 106 108  CO2 29 25  GLUCOSE 171* 150*  BUN 26* 29*  CREATININE 0.56* 0.59*  CALCIUM 8.8* 8.7*    LFT Hepatic Function Latest Ref Rng & Units 08/24/2021 08/21/2021 08/17/2021  Total Protein 6.5 - 8.1 g/dL 6.0(L) 6.0(L) 6.0(L)  Albumin 3.5 - 5.0 g/dL 2.8(L) 2.8(L) 2.8(L)  AST 15 - 41 U/L 22 20 41  ALT 0 - 44 U/L 60(H) 48(H) 65(H)  Alk Phosphatase 38 - 126 U/L 60 40 57  Total Bilirubin 0.3 - 1.2 mg/dL 0.4 0.2(L) 0.4  Bilirubin, Direct 0.0 - 0.2 mg/dL - - -   PT/INR No results for input(s): LABPROT, INR in the last 72 hours. ABG No results for input(s): PHART, HCO3 in the last 72 hours.  Invalid input(s): PCO2, PO2  Studies/Results:  Anti-infectives: Anti-infectives (From admission, onward)    Start     Dose/Rate Route Frequency Ordered Stop   08/09/21 1000  ceFAZolin (ANCEF)  IVPB 2g/100 mL premix  Status:  Discontinued        2 g 200 mL/hr over 30 Minutes Intravenous To Short Stay 08/09/21 0758 08/10/21 1000   08/04/21 2200  oseltamivir (TAMIFLU) capsule 30 mg        30 mg Oral 2 times daily 08/04/21 0822 08/09/21 0959   08/04/21 1000  oseltamivir (TAMIFLU) capsule 75 mg        75 mg Oral  Once 08/04/21 0825 08/04/21 1116   08/03/21 1645  cefTRIAXone (ROCEPHIN) 2 g in sodium chloride 0.9 % 100 mL IVPB  Status:  Discontinued        2 g 200 mL/hr over 30 Minutes Intravenous Every 24 hours 08/03/21 1631 08/06/21 1519   08/03/21 1645  metroNIDAZOLE (FLAGYL) IVPB 500 mg  Status:  Discontinued        500 mg 100 mL/hr over 60 Minutes Intravenous Every 12 hours 08/03/21 1631 08/06/21 1519       Medications: Scheduled Meds:  bisacodyl  10 mg Rectal Daily   Chlorhexidine Gluconate Cloth  6 each Topical Daily   enoxaparin (LOVENOX) injection  40 mg Subcutaneous Q24H   lip balm  1 application Topical BID   mouth rinse  15 mL Mouth Rinse BID   sodium chloride flush  10-40 mL Intracatheter Q12H   sodium chloride flush  3 mL Intravenous Q12H   Continuous Infusions:  sodium chloride     methocarbamol (ROBAXIN) IV     TPN CYCLIC-ADULT (ION)     PRN Meds:.sodium chloride, acetaminophen, albuterol, alum & mag hydroxide-simeth, bisacodyl, enalaprilat, hydrALAZINE, magic mouthwash, menthol-cetylpyridinium, methocarbamol (ROBAXIN) IV **OR** methocarbamol, metoCLOPramide (REGLAN) injection, metoprolol tartrate, morphine injection, ondansetron **OR** ondansetron (ZOFRAN) IV, phenol, prochlorperazine, simethicone, sodium chloride flush, sodium chloride flush  Assessment/Plan: Patient Active Problem List   Diagnosis Date Noted   SBO (small bowel obstruction) ([place]) 08/12/2021   Aortic atherosclerosis ([place]) 08/12/2021   Protein-calorie malnutrition, severe 08/10/2021   Enteritis 08/03/2021   BPH with obstruction/lower urinary tract symptoms 05/16/2021   Wrist pain, acute,  left 03/21/2021   Lumbar radiculopathy, right 01/03/2018   Right hip pain 12/03/2017   Benign prostatic hyperplasia with weak urinary stream 06/07/2017   Essential hypertension 04/26/2017   Seasonal allergies 05/25/2016   Severe major depression, single episode, without psychotic features ([place]) 03/04/2016   Alcohol use disorder, severe, dependence ([place]) 03/04/2016   Cocaine use disorder, moderate, dependence ([place]) 03/04/2016   NICM (nonischemic cardiomyopathy) ([place]) 72/06/1067   Chronic systolic CHF (congestive heart failure) ([place]) 01/23/2016   Epigastric abdominal pain 16/61/9694   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 Grandville Silos 11/9 with findings of dense adhesion near distal ileum down to the mesentery - afebrile, VSS - NGT initially removed 11/13 but replaced 11/19 due to persistent ileus w/ intolerance of PO.  - CT repeated 11/21 without abscess or definite transition zone, dilated loops of small bowel with air fluid levels consistent with ileus. - SBO protocol started 11/23; KUB this am showed contrast in Rt colon - continue PICC and full support TPN  - mobilize/OOB - D/C staples   FEN - NPO, NG to LIWS; TPN, A1c 6/1%, prealbumin 11/19 28.6 ID - ancef periop VTE - SCDs, lovenox Foley - placed periop 11/9, d/c 11/10   AKI - resolved HTN HLD COPD CHF   LOS: 19  Disposition: will clamp NG and see how tolerates gastric secretions. Cont TPN. Ambulate.    LOS: 21 days    [name]. Redmond Pulling, MD, FACS General, Bariatric, & Minimally Invasive Surgery [phone] [facility]

## 2021-08-25 NOTE — Progress Notes (Signed)
Mobility Specialist Progress Note:   08/25/21 1115  Mobility  Activity Transferred:  Chair to bed  Level of Assistance Standby assist, set-up cues, supervision of patient - no hands on  Assistive Device Other (Comment) (IV Pole)  Distance Ambulated (ft) 4 ft  Mobility Ambulated independently in room  Mobility Response Tolerated well  Mobility performed by Mobility specialist  $Mobility charge 1 Mobility   Pt back to bed after 1 hr in chair. Supervision required for safety. NG tube placed back on suction, bed alarm on.   Addison Lank Mobility Specialist  Phone [phone]

## 2021-08-25 NOTE — Progress Notes (Signed)
PHARMACY - TOTAL PARENTERAL NUTRITION CONSULT NOTE  Indication: Small bowel obstruction, ileus  Patient Measurements: Height: 6\' 1"  (185.4 cm) Weight: 64.5 kg (142 lb 3.2 oz) IBW/kg (Calculated) : 79.9 TPN AdjBW (KG): 70 Body mass index is 18.76 kg/m. Usual Weight: 74 kg  Assessment: 70 y.o. male with PMH significant for HTN, CHF with LVEF ~25%, GERD, BPH, prior history of alcoholism who presented with complaints of lower abdominal pain, found to have SBO. Patient has remained NPO/CLD, now for more than 7 days and at risk of becoming severely malnourished. Pharmacy is consulted to manage TPN.   Per RD's conversation with patient, reported he was eating well PTA. States he would have abdominal pain when eating. Per patient 11/15, he sometimes skips meals at home even though his wife is a good cook because he just doesn't feel like eating.  Glucose / Insulin: no hx DM, A1c 6.1% - CBGs acceptable on and off TPN. Currently not on SSI. Electrolytes: K 4 (goal >/= 4 with ileus), others stable WNL Renal: SCr < 1 stable, BUN 27 Hepatic: ALT mildly elevated - trend down, ALT / TG / tbili WNL, albumin 2.8, prealbumin WNL (11/24) Intake / Output; MIVF: UOP 0.4 ml/kg/hr, NG 11-21-1977, LBM 11/22 (Dulcolax supp ordered daily but patient refusing some doses); net +7.7L (down) GI Imaging: 11/3 CT abd: SBO  11/4 Abd XR: small partial SBO 11/6 Abd XR: Persistent SBO  11/7 Abd XR: Persistent SBO  11/8 Abd XR: SBO with interval worsening 11/9 Abd XR: post-op free air, small bowel dilatation 11/21 CT - no abscess or obstruction, consistent with ileus 11/23 Abd XR: incr gaseous distention, may reflect developing obstruction or ileus  GI Surgeries / Procedures:  11/9 ex-lap with LOA   Central access: PICC placed 08/10/21 TPN start date:  08/11/21  Nutritional Goals: Goal continuous TPN rate is 90 mL/hr (provides 118 g of protein and 2334 kcals per day)  RD Estimated Needs Total Energy Estimated Needs:  2300-2500 Total Protein Estimated Needs: 115-130 grams Total Fluid Estimated Needs: > 2 L  Current Nutrition:  Cyclic 12-hr TPN (back to full support 11/20 with NPO status and NGT to LIWS)  Plan:   -Cycle TPN over 12 hours (rate 98-196 ml/hr, GIR 3.93-7.86 mg/kg/min) - provides 121g AA, 345g CHO, 65g ILE, and 2302kCal, meeting 100% of needs -Electrolytes in TPN: Na 121mEq/L, K to 56mEq/L, Ca 38mEq/L, Mg 70mEq/L, Phos 55mmol/L; Cl:Ac 1:1 -Add daily MVI and trace elements to TPN  -Continue custom cyclic 4x daily CBG checks. Monitor off SSI and adjust as needed -Monitor TPN labs Mon/Thurs  10m, PharmD, BCCCP Clinical Pharmacist  Please check AMION for all [facility] Pharmacy numbers  08/25/2021 8:33 AM

## 2021-08-26 DIAGNOSIS — K56609 Unspecified intestinal obstruction, unspecified as to partial versus complete obstruction: Secondary | ICD-10-CM | POA: Diagnosis not present

## 2021-08-26 LAB — GLUCOSE, CAPILLARY
Glucose-Capillary: 96 mg/dL (ref 70–99)
Glucose-Capillary: 105 mg/dL — ABNORMAL HIGH (ref 70–99)
Glucose-Capillary: 89 mg/dL (ref 70–99)
Glucose-Capillary: 143 mg/dL — ABNORMAL HIGH (ref 70–99)

## 2021-08-26 MED ORDER — POLYETHYLENE GLYCOL 3350 17 G PO PACK
17.0000 g | PACK | Freq: Once | ORAL | Status: AC
  Administered 2021-08-26: 17 g via ORAL
  Filled 2021-08-26: qty 1

## 2021-08-26 MED ORDER — TRAVASOL 10 % IV SOLN
INTRAVENOUS | Status: AC
  Filled 2021-08-26: qty 1207.4

## 2021-08-26 NOTE — Progress Notes (Signed)
Physical Therapy Treatment Patient Details Name: [name] MRN: [id number] DOB: [birth year] Today's Date: 08/26/2021   History of Present Illness 70 y/o male who presents on 08/03/21 due to abdominal pain and diarrhea. CT abdomen/pelvis- dilated fluid and air-filled small intestine consistent with small bowel obstruction. Per surgery, ilieus vs enteritis vs partial SBO. Exploratory lap 11/9.   PMH includes HTN, chronic systolic CHF- EF 79%, prior history of alcoholism    PT Comments    Pt agreeable to OOB mobility. Pt ambulatory for good hallway distance with SL support of IV pole, pt having to return to room x1 for urinary urgency. Pt complaining of min abdominal pain and itching, but overall tolerates mobility well. PT to continue to follow.     Recommendations for follow up therapy are one component of a multi-disciplinary discharge planning process, led by the attending physician.  Recommendations may be updated based on patient status, additional functional criteria and insurance authorization.  Follow Up Recommendations  Home health PT     Assistance Recommended at Discharge Intermittent Supervision/Assistance  Equipment Recommendations  Rolling walker (2 wheels)    Recommendations for Other Services       Precautions / Restrictions Precautions Precautions: Fall Precaution Comments: NG tube - clamping trial Restrictions Weight Bearing Restrictions: No     Mobility  Bed Mobility Overal bed mobility: Needs Assistance Bed Mobility: Rolling;Sidelying to Sit;Sit to Supine Rolling: Supervision Sidelying to sit: Supervision     Sit to sidelying: Supervision General bed mobility comments: verbal cuing for log roll for abdominal comfort.    Transfers Overall transfer level: Needs assistance Equipment used: None Transfers: Sit to/from Stand Sit to Stand: Supervision           General transfer comment: for safety, STS x2, from EOB both times.     Ambulation/Gait Ambulation/Gait assistance: Min guard Gait Distance (Feet): 250 Feet Assistive device: IV Pole Gait Pattern/deviations: Step-through pattern;Decreased stride length;Trunk flexed Gait velocity: dcr     General Gait Details: close guard for safety, verbal cuing for upright posture, avoiding bumping into iv pole.   Stairs             Wheelchair Mobility    Modified Rankin (Stroke Patients Only)       Balance Overall balance assessment: Needs assistance Sitting-balance support: No upper extremity supported;Feet supported Sitting balance-Leahy Scale: Good     Standing balance support: No upper extremity supported;During functional activity Standing balance-Leahy Scale: Fair                              Cognition Arousal/Alertness: Awake/alert Behavior During Therapy: WFL for tasks assessed/performed Overall Cognitive Status: No family/caregiver present to determine baseline cognitive functioning                                          Exercises      General Comments        Pertinent Vitals/Pain Pain Assessment: Faces Faces Pain Scale: Hurts little more Pain Location: abdomen Pain Descriptors / Indicators: Sore;Other (Comment) (itching) Pain Intervention(s): Limited activity within patient's tolerance;Monitored during session;Repositioned    Home Living                          Prior Function  PT Goals (current goals can now be found in the care plan section) Acute Rehab PT Goals Patient Stated Goal: get back to work, get home PT Goal Formulation: With patient Time For Goal Achievement: 08/30/21 Potential to Achieve Goals: Good Progress towards PT goals: Progressing toward goals    Frequency    Min 3X/week      PT Plan Current plan remains appropriate    Co-evaluation              AM-PAC PT "6 Clicks" Mobility   Outcome Measure  Help needed turning from your  back to your side while in a flat bed without using bedrails?: A Little Help needed moving from lying on your back to sitting on the side of a flat bed without using bedrails?: A Little Help needed moving to and from a bed to a chair (including a wheelchair)?: A Little Help needed standing up from a chair using your arms (e.g., wheelchair or bedside chair)?: A Little Help needed to walk in hospital room?: A Little Help needed climbing 3-5 steps with a railing? : A Little 6 Click Score: 18    End of Session   Activity Tolerance: Patient tolerated treatment well Patient left: in bed;with call bell/phone within reach;with bed alarm set Nurse Communication: Mobility status PT Visit Diagnosis: Muscle weakness (generalized) (M62.81)     Time: 5102-5852 PT Time Calculation (min) (ACUTE ONLY): 19 min  Charges:  $Gait Training: 8-22 mins                    Marye Round, PT DPT Acute Rehabilitation Services Pager [phone]  Office [phone]    [name] 08/26/2021, 2:40 PM

## 2021-08-26 NOTE — Progress Notes (Addendum)
PHARMACY - TOTAL PARENTERAL NUTRITION CONSULT NOTE  Indication: Small bowel obstruction, prolonged ileus  Patient Measurements: Height: 6\' 1"  (185.4 cm) Weight: 65.4 kg (144 lb 2.9 oz) IBW/kg (Calculated) : 79.9 TPN AdjBW (KG): 70 Body mass index is 19.02 kg/m. Usual Weight: 74 kg  Assessment: 70 y.o. male with PMH significant for HTN, CHF with LVEF ~25%, GERD, BPH, prior history of alcoholism who presented with complaints of lower abdominal pain, found to have SBO. Patient has remained NPO/CLD, now for more than 7 days and at risk of becoming severely malnourished. Pharmacy is consulted to manage TPN.   Per RD's conversation with patient, reported he was eating well PTA. States he would have abdominal pain when eating. Per patient 11/15, he sometimes skips meals at home even though his wife is a good cook because he just doesn't feel like eating.  Glucose / Insulin: BG < 180, no hx DM, A1c 6.1% - CBGs not on insulin Electrolytes: last labs 11/25: K 4 (goal >/= 4 with ileus), Coca 9.6, others stable WNL Renal: SCr < 1 stable, BUN 29 Hepatic: ALT mildly elevated - trend down, ALT / TG / tbili WNL, albumin 2.8, prealbumin WNL (11/24) Intake / Output; MIVF: UOP 0.6 ml/kg/hr, NGT clamped 11-21-1977 out, LBM 11/22 (Miralax 11/25)  GI Imaging: 11/3 CT abd: SBO  11/4 Abd XR: small partial SBO 11/6 Abd XR: Persistent SBO  11/7 Abd XR: Persistent SBO  11/8 Abd XR: SBO with interval worsening 11/9 Abd XR: post-op free air, small bowel dilatation 11/21 CT - no abscess or obstruction, consistent with ileus 11/23 Abd XR: incr gaseous distention, may reflect developing obstruction or ileus  11/25 Abd XR: contrast in R colon  GI Surgeries / Procedures:  11/9 ex-lap with LOA   Central access: PICC placed 08/10/21 TPN start date:  08/11/21  Nutritional Goals: Goal continuous TPN rate is 90 mL/hr (provides 118 g of protein and 2334 kcals per day)  RD Estimated Needs Total Energy Estimated Needs:  2300-2500 Total Protein Estimated Needs: 115-130 grams Total Fluid Estimated Needs: > 2 L  Current Nutrition:  Cyclic 12-hr TPN (back to full support 11/20 with NPO status and NGT to LIWS) 1126 CLD   Plan:   -Cycle TPN over 12 hours (rate 98-196 ml/hr, GIR 4 -7.99 mg/kg/min) - provides 121g AA, 345g CHO, 65g ILE, and 2302kCal, meeting 100% of needs -Electrolytes in TPN: Na 140mEq/L, K to 85mEq/L, Ca 25mEq/L, Mg 62mEq/L, Phos 2mmol/L; Cl:Ac 1:1 -Add daily MVI and trace elements to TPN  -Change to once daily QHS CBG checks  -Monitor TPN labs Mon/Thurs - F/u PO intake, wean TPN as able    10m, PharmD, BCPS, BCCP Clinical Pharmacist  Please check AMION for all [facility] Pharmacy phone numbers After 10:00 PM, call Main Pharmacy [phone]

## 2021-08-26 NOTE — Progress Notes (Signed)
PROGRESS NOTE    [name]  [id number] DOB: Jul 13, 1951 DOA: 08/03/2021 PCP: Lauree Chandler, NP     Brief Narrative:  [name] is an 70 y.o. male essential hypertension chronic systolic heart failure with an EF of 20%, prior history of alcoholism comes in for abdominal pain suspected enteritis and ileitis seen by CT. Failed conservative management, he subsequently developed small bowel obstruction underwent exploratory laparotomy on 08/09/2021 which revealed entrapped by dense adhesions small bowel which were lysed, started on TPN.  Bowel function returning on 11/14, diet advanced a G-tube removed 11/13 has been weaned off TPN.  However, post op course has been complicated by ileus and NG tube replaced 11/19.  Patient remains on TPN with NG tube to LIS.  New events last 24 hours / Subjective: NG remains clamped. Patient without new complaints or concerns today.   Assessment & Plan:   Principal Problem:   SBO (small bowel obstruction) (HCC) Active Problems:   Chronic systolic CHF (congestive heart failure) (HCC)   Protein-calorie malnutrition, severe   Aortic atherosclerosis (HCC)   SBO status post ex lap 11/9, postop course complicated by ileus -General surgery managing care -Remains on NG tube (clamped) and TPN -Continue mobilization  Chronic systolic and diastolic heart failure -Has history of EF 20% and grade 1 diastolic dysfunction via echocardiogram 2017, does not appear fluid overloaded at this time  Influenza A 11/3 -Completed Tamiflu  Hypertension -Home medication lisinopril on hold while n.p.o.  Hyperlipidemia -Home medication Crestor on hold while n.p.o.  CAD -Home medication aspirin on hold while n.p.o.     DVT prophylaxis:  Place and maintain sequential compression device Start: 08/09/21 1228 enoxaparin (LOVENOX) injection 40 mg Start: 08/03/21 1800  Code Status: Full code Family Communication: No family at bedside Disposition Plan:   Status is: Inpatient  Remains inpatient appropriate because: Remains on TPN  Antimicrobials:  Anti-infectives (From admission, onward)    Start     Dose/Rate Route Frequency Ordered Stop   08/09/21 1000  ceFAZolin (ANCEF) IVPB 2g/100 mL premix  Status:  Discontinued        2 g 200 mL/hr over 30 Minutes Intravenous To Short Stay 08/09/21 0758 08/10/21 1000   08/04/21 2200  oseltamivir (TAMIFLU) capsule 30 mg        30 mg Oral 2 times daily 08/04/21 0822 08/09/21 0959   08/04/21 1000  oseltamivir (TAMIFLU) capsule 75 mg        75 mg Oral  Once 08/04/21 0825 08/04/21 1116   08/03/21 1645  cefTRIAXone (ROCEPHIN) 2 g in sodium chloride 0.9 % 100 mL IVPB  Status:  Discontinued        2 g 200 mL/hr over 30 Minutes Intravenous Every 24 hours 08/03/21 1631 08/06/21 1519   08/03/21 1645  metroNIDAZOLE (FLAGYL) IVPB 500 mg  Status:  Discontinued        500 mg 100 mL/hr over 60 Minutes Intravenous Every 12 hours 08/03/21 1631 08/06/21 1519        Objective: Vitals:   08/25/21 1959 08/26/21 0404 08/26/21 0413 08/26/21 0739  BP: 118/83 112/74  108/74  Pulse: 92 100  94  Resp: 17 18  16   Temp: 98.4 F (36.9 C) 98.1 F (36.7 C)  98.3 F (36.8 C)  TempSrc: Oral Oral  Oral  SpO2: 100% 99%  98%  Weight:   65.4 kg   Height:        Intake/Output Summary (Last 24 hours) at 08/26/2021 1236  Last data filed at 08/26/2021 0405 Gross per 24 hour  Intake 1751.3 ml  Output 500 ml  Net 1251.3 ml    Filed Weights   08/25/21 0456 08/25/21 1217 08/26/21 0413  Weight: 64.5 kg 65.7 kg 65.4 kg    Examination:  General exam: Appears calm and comfortable  Respiratory system: Clear to auscultation. Respiratory effort normal. No respiratory distress. No conversational dyspnea.  On room air Cardiovascular system: S1 & S2 heard, RRR. No murmurs. No pedal edema. Gastrointestinal system: Abdomen is mildly distended, nontender to palpation.  Midline Steri-Strips present.  NG tube clamped  Central  nervous system: Alert and oriented. No focal neurological deficits. Speech clear.  Extremities: Symmetric in appearance  Skin: No rashes, lesions or ulcers on exposed skin  Psychiatry: Judgement and insight appear normal. Mood & affect appropriate.   Data Reviewed: I have personally reviewed following labs and imaging studies  CBC: No results for input(s): WBC, NEUTROABS, HGB, HCT, MCV, PLT in the last 168 hours. Basic Metabolic Panel: Recent Labs  Lab 08/20/21 0533 08/21/21 0607 08/22/21 1124 08/23/21 0133 08/24/21 0346 08/25/21 0349  NA 140 141 139 140 140 142  K 4.4 3.8 3.8 3.6 3.7 4.0  CL 106 105 104 107 106 108  CO2 29 26 28 27 29 25   GLUCOSE 127* 148* 122* 163* 171* 150*  BUN 29* 24* 26* 27* 26* 29*  CREATININE 0.62 0.59* 0.65 0.62 0.56* 0.59*  CALCIUM 8.7* 8.9 8.8* 8.8* 8.8* 8.7*  MG  --  2.1  --  2.0 2.1  --   PHOS 2.9 3.5  --  3.9 3.6  --     GFR: Estimated Creatinine Clearance: 80.6 mL/min (A) (by C-G formula based on SCr of 0.59 mg/dL (L)). Liver Function Tests: Recent Labs  Lab 08/21/21 0607 08/24/21 0346  AST 20 22  ALT 48* 60*  ALKPHOS 40 60  BILITOT 0.2* 0.4  PROT 6.0* 6.0*  ALBUMIN 2.8* 2.8*    No results for input(s): LIPASE, AMYLASE in the last 168 hours. No results for input(s): AMMONIA in the last 168 hours. Coagulation Profile: No results for input(s): INR, PROTIME in the last 168 hours. Cardiac Enzymes: No results for input(s): CKTOTAL, CKMB, CKMBINDEX, TROPONINI in the last 168 hours. BNP (last 3 results) No results for input(s): PROBNP in the last 8760 hours. HbA1C: No results for input(s): HGBA1C in the last 72 hours.  CBG: Recent Labs  Lab 08/25/21 0357 08/25/21 0658 08/25/21 1235 08/26/21 0807 08/26/21 1117  GLUCAP 139* 98 105* 96 105*    Lipid Profile: No results for input(s): CHOL, HDL, LDLCALC, TRIG, CHOLHDL, LDLDIRECT in the last 72 hours.  Thyroid Function Tests: No results for input(s): TSH, T4TOTAL, FREET4,  T3FREE, THYROIDAB in the last 72 hours. Anemia Panel: No results for input(s): VITAMINB12, FOLATE, FERRITIN, TIBC, IRON, RETICCTPCT in the last 72 hours. Sepsis Labs: No results for input(s): PROCALCITON, LATICACIDVEN in the last 168 hours.  No results found for this or any previous visit (from the past 240 hour(s)).    Radiology Studies: DG Abd Portable 1V  Result Date: 08/25/2021 CLINICAL DATA:  Ileus, 24 hour delay EXAM: PORTABLE ABDOMEN - 1 VIEW COMPARISON:  KUB 08/23/2021 FINDINGS: Enteric catheter is partially imaged with the tip projecting over the stomach. Gaseous distention of small bowel in the left hemiabdomen is similar to the prior study, with loops measuring up to 5.0 cm. Enteric contrast is seen in the right colon. There is no abnormal soft tissue calcification.  There is no acute osseous abnormality. IMPRESSION: 1. Enteric contrast now seen in the colon. 2. No significant interval change in gaseous distention of small bowel in the left hemiabdomen. Electronically Signed   By: Lesia Hausen M.D.   On: 08/25/2021 08:13      Scheduled Meds:  bisacodyl  10 mg Rectal Daily   Chlorhexidine Gluconate Cloth  6 each Topical Daily   enoxaparin (LOVENOX) injection  40 mg Subcutaneous Q24H   lip balm  1 application Topical BID   mouth rinse  15 mL Mouth Rinse BID   sodium chloride flush  10-40 mL Intracatheter Q12H   sodium chloride flush  3 mL Intravenous Q12H   Continuous Infusions:  sodium chloride     methocarbamol (ROBAXIN) IV     TPN CYCLIC-ADULT (ION)       LOS: 22 days      Time spent: 25 minutes   Noralee Stain, DO [facility] 08/26/2021, 12:36 PM   Available via Epic secure chat 7am-7pm After these hours, please refer to coverage provider listed on [email or web address]

## 2021-08-26 NOTE — Progress Notes (Signed)
Patient ID: [name], male   DOB: [birth year], 70 y.o.   MRN: [id number]   Acute Care Surgery Service Progress Note:    Chief Complaint/Subjective: No bm Denies flatus Ng been clamped since yesterday, denies n/v  Objective: Vital signs in last 24 hours: Temp:  [97.6 F (36.4 C)-98.4 F (36.9 C)] 98.3 F (36.8 C) (11/26 0739) Pulse Rate:  [76-100] 94 (11/26 0739) Resp:  [16-18] 16 (11/26 0739) BP: (108-118)/(74-83) 108/74 (11/26 0739) SpO2:  [98 %-100 %] 98 % (11/26 0739) Weight:  [65.4 kg-65.7 kg] 65.4 kg (11/26 0413) Last BM Date: 08/23/21  Intake/Output from previous day: 11/25 0701 - 11/26 0700 In: 1751.3 [I.V.:1751.3] Out: 500 [Urine:500] Intake/Output this shift: No intake/output data recorded.  Lungs: cta, nonlabored  Cardiovascular: reg  Abd: soft, incision healed, distended- perhaps less than yesterday,nontender  Extremities: no edema, +SCDs  Neuro: alert, nonfocal  Lab Results: CBC  No results for input(s): WBC, HGB, HCT, PLT in the last 72 hours. BMET Recent Labs    08/24/21 0346 08/25/21 0349  NA 140 142  K 3.7 4.0  CL 106 108  CO2 29 25  GLUCOSE 171* 150*  BUN 26* 29*  CREATININE 0.56* 0.59*  CALCIUM 8.8* 8.7*    LFT Hepatic Function Latest Ref Rng & Units 08/24/2021 08/21/2021 08/17/2021  Total Protein 6.5 - 8.1 g/dL 6.0(L) 6.0(L) 6.0(L)  Albumin 3.5 - 5.0 g/dL 2.8(L) 2.8(L) 2.8(L)  AST 15 - 41 U/L 22 20 41  ALT 0 - 44 U/L 60(H) 48(H) 65(H)  Alk Phosphatase 38 - 126 U/L 60 40 57  Total Bilirubin 0.3 - 1.2 mg/dL 0.4 0.2(L) 0.4  Bilirubin, Direct 0.0 - 0.2 mg/dL - - -   PT/INR No results for input(s): LABPROT, INR in the last 72 hours. ABG No results for input(s): PHART, HCO3 in the last 72 hours.  Invalid input(s): PCO2, PO2  Studies/Results:  Anti-infectives: Anti-infectives (From admission, onward)    Start     Dose/Rate Route Frequency Ordered Stop   08/09/21 1000  ceFAZolin (ANCEF) IVPB 2g/100 mL premix  Status:   Discontinued        2 g 200 mL/hr over 30 Minutes Intravenous To Short Stay 08/09/21 0758 08/10/21 1000   08/04/21 2200  oseltamivir (TAMIFLU) capsule 30 mg        30 mg Oral 2 times daily 08/04/21 0822 08/09/21 0959   08/04/21 1000  oseltamivir (TAMIFLU) capsule 75 mg        75 mg Oral  Once 08/04/21 0825 08/04/21 1116   08/03/21 1645  cefTRIAXone (ROCEPHIN) 2 g in sodium chloride 0.9 % 100 mL IVPB  Status:  Discontinued        2 g 200 mL/hr over 30 Minutes Intravenous Every 24 hours 08/03/21 1631 08/06/21 1519   08/03/21 1645  metroNIDAZOLE (FLAGYL) IVPB 500 mg  Status:  Discontinued        500 mg 100 mL/hr over 60 Minutes Intravenous Every 12 hours 08/03/21 1631 08/06/21 1519       Medications: Scheduled Meds:  bisacodyl  10 mg Rectal Daily   Chlorhexidine Gluconate Cloth  6 each Topical Daily   enoxaparin (LOVENOX) injection  40 mg Subcutaneous Q24H   lip balm  1 application Topical BID   mouth rinse  15 mL Mouth Rinse BID   sodium chloride flush  10-40 mL Intracatheter Q12H   sodium chloride flush  3 mL Intravenous Q12H   Continuous Infusions:  sodium chloride  methocarbamol (ROBAXIN) IV     PRN Meds:.sodium chloride, acetaminophen, albuterol, alum & mag hydroxide-simeth, bisacodyl, enalaprilat, hydrALAZINE, magic mouthwash, menthol-cetylpyridinium, methocarbamol (ROBAXIN) IV **OR** methocarbamol, metoCLOPramide (REGLAN) injection, metoprolol tartrate, morphine injection, ondansetron **OR** ondansetron (ZOFRAN) IV, phenol, prochlorperazine, simethicone, sodium chloride flush, sodium chloride flush  Assessment/Plan: Patient Active Problem List   Diagnosis Date Noted   SBO (small bowel obstruction) ([place]) 08/12/2021   Aortic atherosclerosis ([place]) 08/12/2021   Protein-calorie malnutrition, severe 08/10/2021   Enteritis 08/03/2021   BPH with obstruction/lower urinary tract symptoms 05/16/2021   Wrist pain, acute, left 03/21/2021   Lumbar radiculopathy, right 01/03/2018    Right hip pain 12/03/2017   Benign prostatic hyperplasia with weak urinary stream 06/07/2017   Essential hypertension 04/26/2017   Seasonal allergies 05/25/2016   Severe major depression, single episode, without psychotic features ([place]) 03/04/2016   Alcohol use disorder, severe, dependence ([place]) 03/04/2016   Cocaine use disorder, moderate, dependence ([place]) 03/04/2016   NICM (nonischemic cardiomyopathy) ([place]) 93/08/2161   Chronic systolic CHF (congestive heart failure) ([place]) 01/23/2016   Epigastric abdominal pain 44/69/5072   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   Diarrhea 06/13/2011   DERMATOPHYTOSIS OF SCALP AND BEARD 03/18/2008   TOBACCO USER 01/02/2008   Alcohol abuse 11/28/2006   SBO POD 17 s/p ex lap LOA by Dr. Grandville Silos 11/9 with findings of dense adhesion near distal ileum down to the mesentery - afebrile, VSS - NGT initially removed 11/13 but replaced 11/19 due to persistent ileus w/ intolerance of PO.  - CT repeated 11/21 without abscess or definite transition zone, dilated loops of small bowel with air fluid levels consistent with ileus. - SBO protocol started 11/23; KUB 11/25 showed contrast in Rt colon - tolerated clamp, hooked NG up for a few minutes - only got about 200cc thick bile continue PICC and full support TPN  - mobilize/OOB - D/C staples   FEN - NPO, clamp NG; TPN, A1c 6/1%, prealbumin 11/19 28.6 ID - ancef periop VTE - SCDs, lovenox Foley - placed periop 11/9, d/c 11/10   AKI - resolved HTN HLD COPD CHF   LOS: 19  Disposition: cont clamp today. Will allow CLD and see how tolerates. Still with some distension. Will try 1 dose of miralax as well.  Cont TPN. Ambulate.    LOS: 22 days    [name]. Redmond Pulling, MD, FACS General, Bariatric, & Minimally Invasive Surgery [phone] [facility]

## 2021-08-27 ENCOUNTER — Inpatient Hospital Stay (HOSPITAL_COMMUNITY): Payer: Medicare HMO

## 2021-08-27 DIAGNOSIS — J101 Influenza due to other identified influenza virus with other respiratory manifestations: Secondary | ICD-10-CM | POA: Diagnosis not present

## 2021-08-27 DIAGNOSIS — K56609 Unspecified intestinal obstruction, unspecified as to partial versus complete obstruction: Secondary | ICD-10-CM | POA: Diagnosis not present

## 2021-08-27 DIAGNOSIS — I5022 Chronic systolic (congestive) heart failure: Secondary | ICD-10-CM | POA: Diagnosis not present

## 2021-08-27 DIAGNOSIS — E43 Unspecified severe protein-calorie malnutrition: Secondary | ICD-10-CM | POA: Diagnosis not present

## 2021-08-27 LAB — GLUCOSE, CAPILLARY: Glucose-Capillary: 160 mg/dL — ABNORMAL HIGH (ref 70–99)

## 2021-08-27 MED ORDER — TRAVASOL 10 % IV SOLN
INTRAVENOUS | Status: AC
  Filled 2021-08-27: qty 1207.4

## 2021-08-27 MED ORDER — POLYETHYLENE GLYCOL 3350 17 G PO PACK
17.0000 g | PACK | Freq: Every day | ORAL | Status: DC
  Administered 2021-08-27 – 2021-08-31 (×5): 17 g via ORAL
  Filled 2021-08-27 (×6): qty 1

## 2021-08-27 MED ORDER — METOCLOPRAMIDE HCL 5 MG/ML IJ SOLN
10.0000 mg | Freq: Three times a day (TID) | INTRAMUSCULAR | Status: AC
  Administered 2021-08-27 – 2021-08-29 (×6): 10 mg via INTRAVENOUS
  Filled 2021-08-27 (×6): qty 2

## 2021-08-27 NOTE — Plan of Care (Signed)
  Problem: Pain Managment: Goal: General experience of comfort will improve Outcome: Progressing   

## 2021-08-27 NOTE — Progress Notes (Signed)
Patient ID: [name], male   DOB: 04/10/1951, 70 y.o.   MRN: [id number]   Acute Care Surgery Service Progress Note:    Chief Complaint/Subjective: No bm Denies flatus Ng been clamped x48 hr, denies n/v   Objective: Vital signs in last 24 hours: Temp:  [98 F (36.7 C)-98.7 F (37.1 C)] 98.7 F (37.1 C) (11/27 0824) Pulse Rate:  [84-89] 84 (11/27 0824) Resp:  [16-18] 18 (11/27 0824) BP: (113-124)/(70-75) 114/70 (11/27 0824) SpO2:  [99 %-100 %] 100 % (11/27 0824) Weight:  [66.8 kg] 66.8 kg (11/27 0343) Last BM Date: 08/23/21  Intake/Output from previous day: 11/26 0701 - 11/27 0700 In: 2152.5 [P.O.:418; I.V.:1734.5] Out: 750 [Urine:750] Intake/Output this shift: No intake/output data recorded.  Lungs: cta, nonlabored  Cardiovascular: reg  Abd: soft, incision healed, distended,nontender  Extremities: no edema, +SCDs  Neuro: alert, nonfocal  Lab Results: CBC  No results for input(s): WBC, HGB, HCT, PLT in the last 72 hours. BMET Recent Labs    08/25/21 0349  NA 142  K 4.0  CL 108  CO2 25  GLUCOSE 150*  BUN 29*  CREATININE 0.59*  CALCIUM 8.7*    LFT Hepatic Function Latest Ref Rng & Units 08/24/2021 08/21/2021 08/17/2021  Total Protein 6.5 - 8.1 g/dL 6.0(L) 6.0(L) 6.0(L)  Albumin 3.5 - 5.0 g/dL 2.8(L) 2.8(L) 2.8(L)  AST 15 - 41 U/L 22 20 41  ALT 0 - 44 U/L 60(H) 48(H) 65(H)  Alk Phosphatase 38 - 126 U/L 60 40 57  Total Bilirubin 0.3 - 1.2 mg/dL 0.4 0.2(L) 0.4  Bilirubin, Direct 0.0 - 0.2 mg/dL - - -   PT/INR No results for input(s): LABPROT, INR in the last 72 hours. ABG No results for input(s): PHART, HCO3 in the last 72 hours.  Invalid input(s): PCO2, PO2  Studies/Results:  Anti-infectives: Anti-infectives (From admission, onward)    Start     Dose/Rate Route Frequency Ordered Stop   08/09/21 1000  ceFAZolin (ANCEF) IVPB 2g/100 mL premix  Status:  Discontinued        2 g 200 mL/hr over 30 Minutes Intravenous To Short Stay 08/09/21  0758 08/10/21 1000   08/04/21 2200  oseltamivir (TAMIFLU) capsule 30 mg        30 mg Oral 2 times daily 08/04/21 0822 08/09/21 0959   08/04/21 1000  oseltamivir (TAMIFLU) capsule 75 mg        75 mg Oral  Once 08/04/21 0825 08/04/21 1116   08/03/21 1645  cefTRIAXone (ROCEPHIN) 2 g in sodium chloride 0.9 % 100 mL IVPB  Status:  Discontinued        2 g 200 mL/hr over 30 Minutes Intravenous Every 24 hours 08/03/21 1631 08/06/21 1519   08/03/21 1645  metroNIDAZOLE (FLAGYL) IVPB 500 mg  Status:  Discontinued        500 mg 100 mL/hr over 60 Minutes Intravenous Every 12 hours 08/03/21 1631 08/06/21 1519       Medications: Scheduled Meds:  bisacodyl  10 mg Rectal Daily   Chlorhexidine Gluconate Cloth  6 each Topical Daily   enoxaparin (LOVENOX) injection  40 mg Subcutaneous Q24H   lip balm  1 application Topical BID   mouth rinse  15 mL Mouth Rinse BID   sodium chloride flush  10-40 mL Intracatheter Q12H   sodium chloride flush  3 mL Intravenous Q12H   Continuous Infusions:  sodium chloride     methocarbamol (ROBAXIN) IV     PRN Meds:.sodium chloride, acetaminophen, albuterol,  alum & mag hydroxide-simeth, bisacodyl, enalaprilat, hydrALAZINE, magic mouthwash, menthol-cetylpyridinium, methocarbamol (ROBAXIN) IV **OR** methocarbamol, metoCLOPramide (REGLAN) injection, metoprolol tartrate, morphine injection, ondansetron **OR** ondansetron (ZOFRAN) IV, phenol, prochlorperazine, simethicone, sodium chloride flush, sodium chloride flush  Assessment/Plan: Patient Active Problem List   Diagnosis Date Noted   SBO (small bowel obstruction) ([place]) 08/12/2021   Aortic atherosclerosis ([place]) 08/12/2021   Protein-calorie malnutrition, severe 08/10/2021   Enteritis 08/03/2021   BPH with obstruction/lower urinary tract symptoms 05/16/2021   Wrist pain, acute, left 03/21/2021   Lumbar radiculopathy, right 01/03/2018   Right hip pain 12/03/2017   Benign prostatic hyperplasia with weak urinary stream  06/07/2017   Essential hypertension 04/26/2017   Seasonal allergies 05/25/2016   Severe major depression, single episode, without psychotic features ([place]) 03/04/2016   Alcohol use disorder, severe, dependence ([place]) 03/04/2016   Cocaine use disorder, moderate, dependence ([place]) 03/04/2016   NICM (nonischemic cardiomyopathy) ([place]) 57/26/2035   Chronic systolic CHF (congestive heart failure) ([place]) 01/23/2016   Epigastric abdominal pain 59/74/1638   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   Diarrhea 06/13/2011   DERMATOPHYTOSIS OF SCALP AND BEARD 03/18/2008   TOBACCO USER 01/02/2008   Alcohol abuse 11/28/2006   SBO POD 18 s/p ex lap LOA by Dr. Grandville Silos 11/9 with findings of dense adhesion near distal ileum down to the mesentery - afebrile, VSS - NGT initially removed 11/13 but replaced 11/19 due to persistent ileus w/ intolerance of PO.  - CT repeated 11/21 without abscess or definite transition zone, dilated loops of small bowel with air fluid levels consistent with ileus. - SBO protocol started 11/23; KUB 11/25 showed contrast in Rt colon - tolerated clamp, hooked NG up for a few minutes - only got about 300cc thick bile continue PICC and full support TPN  - mobilize/OOB - D/C staples   FEN - NPO, cont clamp NG; TPN, A1c 6/1%, prealbumin 11/19 28.6 ID - ancef periop VTE - SCDs, lovenox Foley - placed periop 11/9, d/c 11/10   AKI - resolved HTN HLD COPD CHF   LOS: 19  Disposition: cont clamp today. Will cont CLD and see how tolerates. Still with  distension. Will give another dose of miralax as well.  Cont TPN. Ambulate. Check abd xray this am. Concerned that pt still has significant sb distension. Will also give suppository.    LOS: 23 days    [name]. Christopher Pulling, MD, FACS General, Bariatric, & Minimally Invasive Surgery [phone] [facility]

## 2021-08-27 NOTE — Progress Notes (Signed)
PROGRESS NOTE        PATIENT DETAILS Name: [name] Age: 70 y.o. Sex: male Date of Birth: 1950-11-20 Admit Date: 08/03/2021 Admitting Physician Darlin Drop, DO EKC:[id number], Janene Harvey, NP  Brief Narrative: [name] with history of HFrEF, EtOH use-who presented with SBO-subsequently underwent exploratory laparotomy on 11/9-further hospital course complicated by prolonged ileus.  See below for further details.  Subjective: Lying comfortably in bed-denies any chest pain or shortness of breath.  Objective: Vitals: Blood pressure 114/70, pulse 84, temperature 98.7 F (37.1 C), temperature source Oral, resp. rate 18, height 6\' 1"  (1.854 m), weight 66.8 kg, SpO2 100 %.   Exam: Gen Exam:Alert awake-not in any distress HEENT:atraumatic, normocephalic Chest: B/L clear to auscultation anteriorly CVS:S1S2 regular Abdomen:soft non tender, non distended.   Extremities:no edema Neurology: Non focal Skin: no rash  Pertinent Labs/Radiology: Recent Labs  Lab 08/21/21 0607 08/22/21 1124 08/24/21 0346 08/25/21 0349  NA 141   < > 140 142  K 3.8   < > 3.7 4.0  CREATININE 0.59*   < > 0.56* 0.59*  AST 20  --  22  --   ALT 48*  --  60*  --   ALKPHOS 40  --  60  --   BILITOT 0.2*  --  0.4  --    < > = values in this interval not displayed.    Assessment/Plan: SBO-s/p exploratory laparotomy on 11/9-postop course complicated by prolonged ileus: General surgery following-NG tube in place-on TPN.  HFrEF (EF 20% on TTE in 2017): Volume status stable/compensated.  Does not require diuretics.  Influenza A: Completed course of Tamiflu-he appears asymptomatic.  HTN: BP stable-resume lisinopril when oral intake resumes.  HLD: Resume Crestor when oral intake resumes  CAD: No anginal symptoms-plan is to resume aspirin when oral intake can be resumed.  Nutrition Status: Nutrition Problem: Severe Malnutrition Etiology: chronic illness  (CHF) Signs/Symptoms: severe muscle depletion, severe fat depletion Interventions: TPN  BMI Estimated body mass index is 19.43 kg/m as calculated from the following:   Height as of this encounter: 6\' 1"  (1.854 m).   Weight as of this encounter: 66.8 kg.   Procedures: None Consults: CCS DVT Prophylaxis: Lovenox Code Status:Full code  Family Communication: None at bedside  Time spent: 25- minutes-Greater than 50% of this time was spent in counseling, explanation of diagnosis, planning of further management, and coordination of care.   Disposition Plan: Status is: Inpatient  Remains inpatient appropriate because: SBO-s/p laparotomy with prolonged ileus-on TPN-NG tube still in place.   Diet: Diet Order             Diet clear liquid Room service appropriate? Yes; Fluid consistency: Thin  Diet effective now                     Antimicrobial agents: Anti-infectives (From admission, onward)    Start     Dose/Rate Route Frequency Ordered Stop   08/09/21 1000  ceFAZolin (ANCEF) IVPB 2g/100 mL premix  Status:  Discontinued        2 g 200 mL/hr over 30 Minutes Intravenous To Short Stay 08/09/21 0758 08/10/21 1000   08/04/21 2200  oseltamivir (TAMIFLU) capsule 30 mg        30 mg Oral 2 times daily 08/04/21 13/04/22 08/09/21 0959   08/04/21  1000  oseltamivir (TAMIFLU) capsule 75 mg        75 mg Oral  Once 08/04/21 0825 08/04/21 1116   08/03/21 1645  cefTRIAXone (ROCEPHIN) 2 g in sodium chloride 0.9 % 100 mL IVPB  Status:  Discontinued        2 g 200 mL/hr over 30 Minutes Intravenous Every 24 hours 08/03/21 1631 08/06/21 1519   08/03/21 1645  metroNIDAZOLE (FLAGYL) IVPB 500 mg  Status:  Discontinued        500 mg 100 mL/hr over 60 Minutes Intravenous Every 12 hours 08/03/21 1631 08/06/21 1519        MEDICATIONS: Scheduled Meds:  bisacodyl  10 mg Rectal Daily   Chlorhexidine Gluconate Cloth  6 each Topical Daily   enoxaparin (LOVENOX) injection  40 mg Subcutaneous Q24H    lip balm  1 application Topical BID   mouth rinse  15 mL Mouth Rinse BID   metoCLOPramide (REGLAN) injection  10 mg Intravenous Q8H   polyethylene glycol  17 g Oral Daily   sodium chloride flush  10-40 mL Intracatheter Q12H   sodium chloride flush  3 mL Intravenous Q12H   Continuous Infusions:  sodium chloride     methocarbamol (ROBAXIN) IV     TPN CYCLIC-ADULT (ION)     PRN Meds:.sodium chloride, acetaminophen, albuterol, alum & mag hydroxide-simeth, bisacodyl, enalaprilat, hydrALAZINE, magic mouthwash, menthol-cetylpyridinium, methocarbamol (ROBAXIN) IV **OR** methocarbamol, metoCLOPramide (REGLAN) injection, metoprolol tartrate, morphine injection, ondansetron **OR** ondansetron (ZOFRAN) IV, phenol, prochlorperazine, simethicone, sodium chloride flush, sodium chloride flush   I have personally reviewed following labs and imaging studies  LABORATORY DATA: CBC: No results for input(s): WBC, NEUTROABS, HGB, HCT, MCV, PLT in the last 168 hours.  Basic Metabolic Panel: Recent Labs  Lab 08/21/21 0607 08/22/21 1124 08/23/21 0133 08/24/21 0346 08/25/21 0349  NA 141 139 140 140 142  K 3.8 3.8 3.6 3.7 4.0  CL 105 104 107 106 108  CO2 26 28 27 29 25   GLUCOSE 148* 122* 163* 171* 150*  BUN 24* 26* 27* 26* 29*  CREATININE 0.59* 0.65 0.62 0.56* 0.59*  CALCIUM 8.9 8.8* 8.8* 8.8* 8.7*  MG 2.1  --  2.0 2.1  --   PHOS 3.5  --  3.9 3.6  --     GFR: Estimated Creatinine Clearance: 82.3 mL/min (A) (by C-G formula based on SCr of 0.59 mg/dL (L)).  Liver Function Tests: Recent Labs  Lab 08/21/21 0607 08/24/21 0346  AST 20 22  ALT 48* 60*  ALKPHOS 40 60  BILITOT 0.2* 0.4  PROT 6.0* 6.0*  ALBUMIN 2.8* 2.8*   No results for input(s): LIPASE, AMYLASE in the last 168 hours. No results for input(s): AMMONIA in the last 168 hours.  Coagulation Profile: No results for input(s): INR, PROTIME in the last 168 hours.  Cardiac Enzymes: No results for input(s): CKTOTAL, CKMB,  CKMBINDEX, TROPONINI in the last 168 hours.  BNP (last 3 results) No results for input(s): PROBNP in the last 8760 hours.  Lipid Profile: No results for input(s): CHOL, HDL, LDLCALC, TRIG, CHOLHDL, LDLDIRECT in the last 72 hours.  Thyroid Function Tests: No results for input(s): TSH, T4TOTAL, FREET4, T3FREE, THYROIDAB in the last 72 hours.  Anemia Panel: No results for input(s): VITAMINB12, FOLATE, FERRITIN, TIBC, IRON, RETICCTPCT in the last 72 hours.  Urine analysis:    Component Value Date/Time   COLORURINE YELLOW 12/06/2007 2258   APPEARANCEUR Cloudy (A) 10/28/2015 1615   LABSPEC 1.020 05/08/2011 1016   PHURINE  5.5 05/08/2011 1016   GLUCOSEU Negative 10/28/2015 1615   HGBUR SMALL (A) 05/08/2011 1016   BILIRUBINUR Negative 10/28/2015 1615   KETONESUR NEGATIVE 05/08/2011 1016   PROTEINUR Trace 10/28/2015 1615   PROTEINUR NEGATIVE 05/08/2011 1016   UROBILINOGEN 0.2 05/08/2011 1016   NITRITE Positive (A) 10/28/2015 1615   NITRITE NEGATIVE 05/08/2011 1016   LEUKOCYTESUR Negative 10/28/2015 1615    Sepsis Labs: Lactic Acid, Venous No results found for: LATICACIDVEN  MICROBIOLOGY: No results found for this or any previous visit (from the past 240 hour(s)).  RADIOLOGY STUDIES/RESULTS: No results found.   LOS: 23 days   Jeoffrey Massed, MD  [facility]    To contact the attending provider between 7A-7P or the covering provider during after hours 7P-7A, please log into the web site [email or web address] and access using universal [place] password for that web site. If you do not have the password, please call the hospital operator.  08/27/2021, 12:09 PM

## 2021-08-27 NOTE — Progress Notes (Signed)
PHARMACY - TOTAL PARENTERAL NUTRITION CONSULT NOTE  Indication: Small bowel obstruction, prolonged ileus  Patient Measurements: Height: 6\' 1"  (185.4 cm) Weight: 66.8 kg (147 lb 4.3 oz) IBW/kg (Calculated) : 79.9 TPN AdjBW (KG): 70 Body mass index is 19.43 kg/m. Usual Weight: 74 kg  Assessment: 70 y.o. male with PMH significant for HTN, CHF with LVEF ~25%, GERD, BPH, prior history of alcoholism who presented with complaints of lower abdominal pain, found to have SBO. Patient has remained NPO/CLD, now for more than 7 days and at risk of becoming severely malnourished. Pharmacy is consulted to manage TPN.   Per RD's conversation with patient, reported he was eating well PTA. States he would have abdominal pain when eating. Per patient 11/15, he sometimes skips meals at home even though his wife is a good cook because he just doesn't feel like eating.  Glucose / Insulin: BG < 180, no hx DM, A1c 6.1% - CBGs not on insulin Electrolytes: last labs 11/25: K 4 (goal >/= 4 with ileus), Coca 9.6, others stable WNL Renal: SCr < 1 stable, BUN 29 Hepatic: ALT mildly elevated - trend down, ALT / TG / tbili WNL, albumin 2.8, prealbumin WNL (11/24) Intake / Output; MIVF: UOP 0.9 ml/kg/hr, NGT clamped 15mL out, LBM 11/22 (Miralax 11/25, patient refused bisacodyl suppository)  GI Imaging: 11/3 CT abd: SBO  11/4 Abd XR: small partial SBO 11/6 Abd XR: Persistent SBO  11/7 Abd XR: Persistent SBO  11/8 Abd XR: SBO with interval worsening 11/9 Abd XR: post-op free air, small bowel dilatation 11/21 CT - no abscess or obstruction, consistent with ileus 11/23 Abd XR: incr gaseous distention, may reflect developing obstruction or ileus  11/25 Abd XR: contrast in R colon  GI Surgeries / Procedures:  11/9 ex-lap with LOA   Central access: PICC placed 08/10/21 TPN start date:  08/11/21  Nutritional Goals: Goal continuous TPN rate is 90 mL/hr (provides 118 g of protein and 2334 kcals per day)  RD Estimated  Needs Total Energy Estimated Needs: 2300-2500 Total Protein Estimated Needs: 115-130 grams Total Fluid Estimated Needs: > 2 L  Current Nutrition:  Cyclic 12-hr TPN (back to full support 11/20 with NPO status and NGT to LIWS) 1126 CLD   Plan:   -Cycle TPN over 12 hours (rate 98-196 ml/hr, GIR 4 -7.99 mg/kg/min) - provides 121g AA, 345g CHO, 65g ILE, and 2302kCal, meeting 100% of needs -Electrolytes in TPN: Na 191mEq/L, K to 25mEq/L, Ca 22mEq/L, Mg 32mEq/L, Phos 62mmol/L; Cl:Ac 1:1 -Add daily MVI and trace elements to TPN  -Stop CBG checks  -Monitor TPN labs Mon/Thurs - F/u PO intake, wean TPN as able    10m, PharmD, BCPS, BCCP Clinical Pharmacist  Please check AMION for all [facility] Pharmacy phone numbers After 10:00 PM, call Main Pharmacy [phone]

## 2021-08-28 DIAGNOSIS — K56609 Unspecified intestinal obstruction, unspecified as to partial versus complete obstruction: Secondary | ICD-10-CM | POA: Diagnosis not present

## 2021-08-28 LAB — COMPREHENSIVE METABOLIC PANEL
ALT: 38 U/L (ref 0–44)
AST: 17 U/L (ref 15–41)
Albumin: 2.6 g/dL — ABNORMAL LOW (ref 3.5–5.0)
Alkaline Phosphatase: 52 U/L (ref 38–126)
Anion gap: 7 (ref 5–15)
BUN: 25 mg/dL — ABNORMAL HIGH (ref 8–23)
CO2: 23 mmol/L (ref 22–32)
Calcium: 8.1 mg/dL — ABNORMAL LOW (ref 8.9–10.3)
Chloride: 104 mmol/L (ref 98–111)
Creatinine, Ser: 0.53 mg/dL — ABNORMAL LOW (ref 0.61–1.24)
GFR, Estimated: >60 mL/min (ref >60)
Glucose, Bld: 150 mg/dL — ABNORMAL HIGH (ref 70–99)
Potassium: 3.5 mmol/L (ref 3.5–5.1)
Sodium: 134 mmol/L — ABNORMAL LOW (ref 135–145)
Total Bilirubin: 0.2 mg/dL — ABNORMAL LOW (ref 0.3–1.2)
Total Protein: 5.8 g/dL — ABNORMAL LOW (ref 6.5–8.1)

## 2021-08-28 LAB — MAGNESIUM: Magnesium: 1.8 mg/dL (ref 1.7–2.4)

## 2021-08-28 LAB — TRIGLYCERIDES: Triglycerides: 49 mg/dL (ref <150)

## 2021-08-28 LAB — GLUCOSE, CAPILLARY
Glucose-Capillary: 92 mg/dL (ref 70–99)
Glucose-Capillary: 84 mg/dL (ref 70–99)

## 2021-08-28 LAB — PHOSPHORUS: Phosphorus: 3.3 mg/dL (ref 2.5–4.6)

## 2021-08-28 MED ORDER — TRAVASOL 10 % IV SOLN
INTRAVENOUS | Status: AC
  Filled 2021-08-28: qty 1207.36

## 2021-08-28 MED ORDER — BOOST / RESOURCE BREEZE PO LIQD CUSTOM
1.0000 | Freq: Two times a day (BID) | ORAL | Status: DC
  Administered 2021-08-29 (×2): 1 via ORAL

## 2021-08-28 NOTE — Progress Notes (Signed)
Patient ID: [name], male   DOB: 05-22-1951, 70 y.o.   MRN: [id number]   Acute Care Surgery Service Progress Note:    Chief Complaint/Subjective: BMx1 document in epic last 24h. Per patient he is not having flatus. He states he refused the suppository due to discomfort and concern he would be incontinent of stool. Denies a BM. Tolerating CLD. States sometimes his abdomen is soft, sometimes it is distended. Denies nausea or vomiting. Thinks he drink 2 sprites and 2-3 juices yesterday.     Objective: Vital signs in last 24 hours: Temp:  [98.3 F (36.8 C)-98.7 F (37.1 C)] 98.3 F (36.8 C) (11/28 0728) Pulse Rate:  [84-95] 88 (11/28 0728) Resp:  [16-18] 18 (11/28 0728) BP: (109-123)/(69-76) 109/70 (11/28 0728) SpO2:  [97 %-100 %] 98 % (11/28 0728) Last BM Date: 08/27/21  Intake/Output from previous day: 11/27 0701 - 11/28 0700 In: 1835.5 [I.V.:1835.5] Out: 1800 [Urine:1800] Intake/Output this shift: No intake/output data recorded.  Lungs: cta, nonlabored  Cardiovascular: reg  Abd: soft, incision healed, distended, nontender  Extremities: no edema, +SCDs  Neuro: alert, nonfocal  Lab Results: CBC  No results for input(s): WBC, HGB, HCT, PLT in the last 72 hours. BMET Recent Labs    08/28/21 0237  NA 134*  K 3.5  CL 104  CO2 23  GLUCOSE 150*  BUN 25*  CREATININE 0.53*  CALCIUM 8.1*   LFT Hepatic Function Latest Ref Rng & Units 08/28/2021 08/24/2021 08/21/2021  Total Protein 6.5 - 8.1 g/dL 5.8(L) 6.0(L) 6.0(L)  Albumin 3.5 - 5.0 g/dL 2.6(L) 2.8(L) 2.8(L)  AST 15 - 41 U/L '17 22 20  ' ALT 0 - 44 U/L 38 60(H) 48(H)  Alk Phosphatase 38 - 126 U/L 52 60 40  Total Bilirubin 0.3 - 1.2 mg/dL 0.2(L) 0.4 0.2(L)  Bilirubin, Direct 0.0 - 0.2 mg/dL - - -   PT/INR No results for input(s): LABPROT, INR in the last 72 hours. ABG No results for input(s): PHART, HCO3 in the last 72 hours.  Invalid input(s): PCO2,  PO2  Studies/Results:  Anti-infectives: Anti-infectives (From admission, onward)    Start     Dose/Rate Route Frequency Ordered Stop   08/09/21 1000  ceFAZolin (ANCEF) IVPB 2g/100 mL premix  Status:  Discontinued        2 g 200 mL/hr over 30 Minutes Intravenous To Short Stay 08/09/21 0758 08/10/21 1000   08/04/21 2200  oseltamivir (TAMIFLU) capsule 30 mg        30 mg Oral 2 times daily 08/04/21 0822 08/09/21 0959   08/04/21 1000  oseltamivir (TAMIFLU) capsule 75 mg        75 mg Oral  Once 08/04/21 0825 08/04/21 1116   08/03/21 1645  cefTRIAXone (ROCEPHIN) 2 g in sodium chloride 0.9 % 100 mL IVPB  Status:  Discontinued        2 g 200 mL/hr over 30 Minutes Intravenous Every 24 hours 08/03/21 1631 08/06/21 1519   08/03/21 1645  metroNIDAZOLE (FLAGYL) IVPB 500 mg  Status:  Discontinued        500 mg 100 mL/hr over 60 Minutes Intravenous Every 12 hours 08/03/21 1631 08/06/21 1519       Medications: Scheduled Meds:  bisacodyl  10 mg Rectal Daily   Chlorhexidine Gluconate Cloth  6 each Topical Daily   enoxaparin (LOVENOX) injection  40 mg Subcutaneous Q24H   lip balm  1 application Topical BID   mouth rinse  15 mL Mouth Rinse BID   metoCLOPramide (  REGLAN) injection  10 mg Intravenous Q8H   polyethylene glycol  17 g Oral Daily   sodium chloride flush  10-40 mL Intracatheter Q12H   sodium chloride flush  3 mL Intravenous Q12H   Continuous Infusions:  sodium chloride     methocarbamol (ROBAXIN) IV     PRN Meds:.sodium chloride, acetaminophen, albuterol, alum & mag hydroxide-simeth, bisacodyl, enalaprilat, hydrALAZINE, magic mouthwash, menthol-cetylpyridinium, methocarbamol (ROBAXIN) IV **OR** methocarbamol, metoCLOPramide (REGLAN) injection, metoprolol tartrate, morphine injection, ondansetron **OR** ondansetron (ZOFRAN) IV, phenol, prochlorperazine, simethicone, sodium chloride flush, sodium chloride flush  Assessment/Plan: Patient Active Problem List   Diagnosis Date Noted   SBO  (small bowel obstruction) ([place]) 08/12/2021   Aortic atherosclerosis ([place]) 08/12/2021   Protein-calorie malnutrition, severe 08/10/2021   Enteritis 08/03/2021   BPH with obstruction/lower urinary tract symptoms 05/16/2021   Wrist pain, acute, left 03/21/2021   Lumbar radiculopathy, right 01/03/2018   Right hip pain 12/03/2017   Benign prostatic hyperplasia with weak urinary stream 06/07/2017   Essential hypertension 04/26/2017   Seasonal allergies 05/25/2016   Severe major depression, single episode, without psychotic features ([place]) 03/04/2016   Alcohol use disorder, severe, dependence ([place]) 03/04/2016   Cocaine use disorder, moderate, dependence ([place]) 03/04/2016   NICM (nonischemic cardiomyopathy) ([place]) 77/82/4235   Chronic systolic CHF (congestive heart failure) ([place]) 01/23/2016   Epigastric abdominal pain 36/14/4315   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   Diarrhea 06/13/2011   DERMATOPHYTOSIS OF [place] 03/18/2008   TOBACCO USER 01/02/2008   Alcohol abuse 11/28/2006   SBO POD 19 s/p ex lap LOA by Dr. Grandville Silos 11/9 with findings of dense adhesion near distal ileum down to the mesentery - afebrile, VSS - NGT initially removed 11/13 but replaced 11/19 due to persistent ileus w/ intolerance of PO.  - CT repeated 11/21 without abscess or definite transition zone, dilated loops of small bowel with air fluid levels consistent with ileus. - SBO protocol started 11/23; KUB 11/25 showed contrast in Rt colon, KUB 11/27 w/ contrast in rectum but persistently dilated small bowel . - tolerated NG clamp trials last 48 hours. I am still hesitant to D/C NG tube given distention on exam and lack of reliable bowel function. Place to suction this AM and check residuals. Patient advised to stop PO intake during this evaluation. Will either remove NG this afternoon or continue clamping trials.  continue PICC and full support TPN  - mobilize/OOB - D/C staples   FEN - NPO,  NG clamp trials; TPN,  A1c 6/1%, prealbumin 11/19 28.6 ID - ancef periop VTE - SCDs, lovenox Foley - placed periop 11/9, d/c 11/10   AKI - resolved HTN HLD COPD CHF   LOS: 19     LOS: 24 days    [facility] [phone] [facility]

## 2021-08-28 NOTE — Progress Notes (Signed)
PROGRESS NOTE        PATIENT DETAILS Name: [name] Age: 70 y.o. Sex: male Date of Birth: [birth year] Admit Date: 08/03/2021 Admitting Physician Darlin Drop, DO FHL:[id number], Janene Harvey, NP  Brief Narrative: Patient is a 70 y.o. male with history of HFrEF, EtOH use-who presented with SBO-subsequently underwent exploratory laparotomy on 11/9-further hospital course complicated by prolonged ileus.  See below for further details.  Subjective: Lying comfortably in bed-no chest pain or shortness of breath.  Objective: Vitals: Blood pressure 109/70, pulse 88, temperature 98.3 F (36.8 C), temperature source Oral, resp. rate 18, height 6\' 1"  (1.854 m), weight 66.8 kg, SpO2 98 %.   Exam: Gen Exam:Alert awake-not in any distress HEENT:atraumatic, normocephalic Chest: B/L clear to auscultation anteriorly CVS:S1S2 regular Abdomen:soft non tender, non distended Extremities:no edema Neurology: Non focal Skin: no rash   Pertinent Labs/Radiology: Recent Labs  Lab 08/24/21 0346 08/25/21 0349 08/28/21 0237  NA 140   < > 134*  K 3.7   < > 3.5  CREATININE 0.56*   < > 0.53*  AST 22  --  17  ALT 60*  --  38  ALKPHOS 60  --  52  BILITOT 0.4  --  0.2*   < > = values in this interval not displayed.     Assessment/Plan: SBO-s/p exploratory laparotomy on 11/9-postop course complicated by prolonged ileus: General surgery following-NG tube in place-on TPN.  Will defer further care to general surgery.  HFrEF (EF 20% on TTE in 2017): Volume status stable/compensated.  Does not require diuretics.  Influenza A: Completed course of Tamiflu-he appears asymptomatic.  HTN: BP stable-resume lisinopril when oral intake resumes.  HLD: Resume Crestor when oral intake resumes  CAD: No anginal symptoms-plan is to resume aspirin when oral intake can be resumed.  Nutrition Status: Nutrition Problem: Severe Malnutrition Etiology: chronic illness  (CHF) Signs/Symptoms: severe muscle depletion, severe fat depletion Interventions: TPN  BMI Estimated body mass index is 19.43 kg/m as calculated from the following:   Height as of this encounter: 6\' 1"  (1.854 m).   Weight as of this encounter: 66.8 kg.   Procedures: None Consults: CCS DVT Prophylaxis: Lovenox Code Status:Full code  Family Communication: None at bedside  Time spent: 15- minutes-Greater than 50% of this time was spent in counseling, explanation of diagnosis, planning of further management, and coordination of care.   Disposition Plan: Status is: Inpatient  Remains inpatient appropriate because: SBO-s/p laparotomy with prolonged ileus-on TPN-NG tube still in place.   Diet: Diet Order             Diet clear liquid Room service appropriate? Yes; Fluid consistency: Thin  Diet effective now                     Antimicrobial agents: Anti-infectives (From admission, onward)    Start     Dose/Rate Route Frequency Ordered Stop   08/09/21 1000  ceFAZolin (ANCEF) IVPB 2g/100 mL premix  Status:  Discontinued        2 g 200 mL/hr over 30 Minutes Intravenous To Short Stay 08/09/21 0758 08/10/21 1000   08/04/21 2200  oseltamivir (TAMIFLU) capsule 30 mg        30 mg Oral 2 times daily 08/04/21 0822 08/09/21 0959   08/04/21 1000  oseltamivir (TAMIFLU) capsule 75 mg  75 mg Oral  Once 08/04/21 0825 08/04/21 1116   08/03/21 1645  cefTRIAXone (ROCEPHIN) 2 g in sodium chloride 0.9 % 100 mL IVPB  Status:  Discontinued        2 g 200 mL/hr over 30 Minutes Intravenous Every 24 hours 08/03/21 1631 08/06/21 1519   08/03/21 1645  metroNIDAZOLE (FLAGYL) IVPB 500 mg  Status:  Discontinued        500 mg 100 mL/hr over 60 Minutes Intravenous Every 12 hours 08/03/21 1631 08/06/21 1519        MEDICATIONS: Scheduled Meds:  bisacodyl  10 mg Rectal Daily   Chlorhexidine Gluconate Cloth  6 each Topical Daily   enoxaparin (LOVENOX) injection  40 mg Subcutaneous Q24H    lip balm  1 application Topical BID   mouth rinse  15 mL Mouth Rinse BID   metoCLOPramide (REGLAN) injection  10 mg Intravenous Q8H   polyethylene glycol  17 g Oral Daily   sodium chloride flush  10-40 mL Intracatheter Q12H   sodium chloride flush  3 mL Intravenous Q12H   Continuous Infusions:  sodium chloride     methocarbamol (ROBAXIN) IV     TPN CYCLIC-ADULT (ION)     PRN Meds:.sodium chloride, acetaminophen, albuterol, alum & mag hydroxide-simeth, bisacodyl, enalaprilat, hydrALAZINE, magic mouthwash, menthol-cetylpyridinium, methocarbamol (ROBAXIN) IV **OR** methocarbamol, metoCLOPramide (REGLAN) injection, metoprolol tartrate, morphine injection, ondansetron **OR** ondansetron (ZOFRAN) IV, phenol, prochlorperazine, simethicone, sodium chloride flush, sodium chloride flush   I have personally reviewed following labs and imaging studies  LABORATORY DATA: CBC: No results for input(s): WBC, NEUTROABS, HGB, HCT, MCV, PLT in the last 168 hours.  Basic Metabolic Panel: Recent Labs  Lab 08/22/21 1124 08/23/21 0133 08/24/21 0346 08/25/21 0349 08/28/21 0237  NA 139 140 140 142 134*  K 3.8 3.6 3.7 4.0 3.5  CL 104 107 106 108 104  CO2 28 27 29 25 23   GLUCOSE 122* 163* 171* 150* 150*  BUN 26* 27* 26* 29* 25*  CREATININE 0.65 0.62 0.56* 0.59* 0.53*  CALCIUM 8.8* 8.8* 8.8* 8.7* 8.1*  MG  --  2.0 2.1  --  1.8  PHOS  --  3.9 3.6  --  3.3     GFR: Estimated Creatinine Clearance: 82.3 mL/min (A) (by C-G formula based on SCr of 0.53 mg/dL (L)).  Liver Function Tests: Recent Labs  Lab 08/24/21 0346 08/28/21 0237  AST 22 17  ALT 60* 38  ALKPHOS 60 52  BILITOT 0.4 0.2*  PROT 6.0* 5.8*  ALBUMIN 2.8* 2.6*    No results for input(s): LIPASE, AMYLASE in the last 168 hours. No results for input(s): AMMONIA in the last 168 hours.  Coagulation Profile: No results for input(s): INR, PROTIME in the last 168 hours.  Cardiac Enzymes: No results for input(s): CKTOTAL, CKMB,  CKMBINDEX, TROPONINI in the last 168 hours.  BNP (last 3 results) No results for input(s): PROBNP in the last 8760 hours.  Lipid Profile: Recent Labs    08/28/21 0237  TRIG 49    Thyroid Function Tests: No results for input(s): TSH, T4TOTAL, FREET4, T3FREE, THYROIDAB in the last 72 hours.  Anemia Panel: No results for input(s): VITAMINB12, FOLATE, FERRITIN, TIBC, IRON, RETICCTPCT in the last 72 hours.  Urine analysis:    Component Value Date/Time   COLORURINE YELLOW 12/06/2007 2258   APPEARANCEUR Cloudy (A) 10/28/2015 1615   LABSPEC 1.020 05/08/2011 1016   PHURINE 5.5 05/08/2011 1016   GLUCOSEU Negative 10/28/2015 1615   HGBUR SMALL (A) 05/08/2011  1016   BILIRUBINUR Negative 10/28/2015 1615   KETONESUR NEGATIVE 05/08/2011 1016   PROTEINUR Trace 10/28/2015 1615   PROTEINUR NEGATIVE 05/08/2011 1016   UROBILINOGEN 0.2 05/08/2011 1016   NITRITE Positive (A) 10/28/2015 1615   NITRITE NEGATIVE 05/08/2011 1016   LEUKOCYTESUR Negative 10/28/2015 1615    Sepsis Labs: Lactic Acid, Venous No results found for: LATICACIDVEN  MICROBIOLOGY: No results found for this or any previous visit (from the past 240 hour(s)).  RADIOLOGY STUDIES/RESULTS: DG Abd Portable 1V  Result Date: 08/27/2021 CLINICAL DATA:  Abdominal distension EXAM: PORTABLE ABDOMEN - 1 VIEW COMPARISON:  Portable exam 0949 hours compared to 08/25/2021 FINDINGS: Tip of nasogastric tube projects over mid stomach. Retained contrast scattered throughout colon to rectum. Few distended small bowel loops in the LEFT mid abdomen are again seen. No bowel wall thickening or acute osseous findings. IMPRESSION: Persistent mildly distended nonspecific small bowel loops in the LEFT mid abdomen, though GI contrast has progressed to the rectum without definite evidence of obstruction. Electronically Signed   By: Ulyses Southward M.D.   On: 08/27/2021 14:07     LOS: 24 days   Jeoffrey Massed, MD  [facility]    To contact the  attending provider between 7A-7P or the covering provider during after hours 7P-7A, please log into the web site [email or web address] and access using universal [place] password for that web site. If you do not have the password, please call the hospital operator.  08/28/2021, 2:34 PM

## 2021-08-28 NOTE — Progress Notes (Signed)
PHARMACY - TOTAL PARENTERAL NUTRITION CONSULT NOTE  Indication: Small bowel obstruction, prolonged ileus  Patient Measurements: Height: 6\' 1"  (185.4 cm) Weight: 66.8 kg (147 lb 4.3 oz) IBW/kg (Calculated) : 79.9 TPN AdjBW (KG): 70 Body mass index is 19.43 kg/m. Usual Weight: 74 kg  Assessment:  70 y.o. male with PMH significant for HTN, CHF with LVEF ~25%, GERD, BPH, prior history of alcoholism who presented with complaints of lower abdominal pain, found to have SBO. Patient has remained NPO/CLD, now for more than 7 days and at risk of becoming severely malnourished. Pharmacy is consulted to manage TPN.   Per RD's conversation with patient, reported he was eating well PTA. States he would have abdominal pain when eating. Per patient 11/15, he sometimes skips meals at home even though his wife is a good cook because he just doesn't feel like eating.  Glucose / Insulin: BG < 180, no hx DM, A1c 6.1% - CBGs not on insulin Electrolytes: K down 4.0 > 3.5 (goal >/= 4 with ileus), Na 134 down, CoCa 9.2, others stable WNL Renal: SCr < 1 stable, BUN 25 Hepatic: LFTs / TG / tbili WNL, albumin 2.6, prealbumin WNL (11/24) Intake / Output; MIVF: UOP 0.8 ml/kg/hr (down slightly), NGT clamped 52mL out, LBM 11/27 (Miralax 11/25, patient refused bisacodyl suppository)  GI Imaging: 11/3 CT abd: SBO  11/4 Abd XR: small partial SBO 11/6 Abd XR: Persistent SBO  11/7 Abd XR: Persistent SBO  11/8 Abd XR: SBO with interval worsening 11/9 Abd XR: post-op free air, small bowel dilatation 11/21 CT - no abscess or obstruction, consistent with ileus 11/23 Abd XR: incr gaseous distention, may reflect developing obstruction or ileus  11/25 Abd XR: contrast in R colon  GI Surgeries / Procedures:  11/9 ex-lap with LOA   Central access: PICC placed 08/10/21 TPN start date:  08/11/21  Nutritional Goals: Goal continuous TPN rate is 90 mL/hr (provides 118 g of protein and 2334 kcals per day)  RD Estimated  Needs Total Energy Estimated Needs: 2300-2500 Total Protein Estimated Needs: 115-130 grams Total Fluid Estimated Needs: > 2 L  Current Nutrition:  Cyclic 12-hr TPN (back to full support 11/20 with NPO status and NGT to LIWS) 1126 CLD   Plan:   - Cycle TPN over 12 hours (rate 98 - 196 ml/hr, GIR 4 - 7.99 mg/kg/min) - provides 121g AA, 345g CHO, 65g ILE, and 2302 kCal, meeting 100% of needs - Electrolytes in TPN: Na 123mEq/L, increase K to 65mEq/L, Ca 41mEq/L, increase Mg to 5 mEq/L, Phos 33mmol/L; Cl:Ac 1:1 - Add daily MVI and trace elements to TPN  - daily CBG check, adjust as needed - Monitor TPN labs Mon/Thurs - F/u PO intake, wean TPN as able    Thank you for allowing pharmacy to be a part of this patient's care.  10m, PharmD Clinical Pharmacist

## 2021-08-28 NOTE — Progress Notes (Signed)
Mobility Specialist Progress Note:   08/28/21 1100  Mobility  Activity Ambulated in hall  Level of Assistance Standby assist, set-up cues, supervision of patient - no hands on  Assistive Device Other (Comment) (IV Pole)  Distance Ambulated (ft) 570 ft  Mobility Ambulated independently in hallway  Mobility Response Tolerated well  Mobility performed by Mobility specialist  Bed Position Chair  $Mobility charge 1 Mobility   Pt asx during ambulation. NG tube placed back on suction. Pt back in chair with all needs met.  Nelta Numbers Mobility Specialist  Phone [phone]

## 2021-08-28 NOTE — Progress Notes (Signed)
Blood sugar is 188 at this time.

## 2021-08-28 NOTE — Progress Notes (Signed)
Physical Therapy Treatment Patient Details Name: [name] MRN: [id number] DOB: March 16, 1951 Today's Date: 08/28/2021   History of Present Illness 70 y/o male who presents on 08/03/21 due to abdominal pain and diarrhea. CT abdomen/pelvis- dilated fluid and air-filled small intestine consistent with small bowel obstruction. Per surgery, ilieus vs enteritis vs partial SBO. Exploratory lap 11/9.   PMH includes HTN, chronic systolic CHF- EF 92%, prior history of alcoholism    PT Comments    Pt was assisted to do LE strengthening and ROM today after declining to do any walking.  Pt is expecting to work with mobility tech and will revisit that request then.  Pt is tolerant of movement, fatiguing quickly but is comfortable with regard to his abd symptoms.  Pt is expecting to go home with HHPT, and has family support to make this realistic.  Follow up with goals of acute PT to decrease his instability with standing, to increase quality of gait and to improve his ROM on LE's for further strengthening in available ROM.   Recommendations for follow up therapy are one component of a multi-disciplinary discharge planning process, led by the attending physician.  Recommendations may be updated based on patient status, additional functional criteria and insurance authorization.  Follow Up Recommendations  Home health PT     Assistance Recommended at Discharge Intermittent Supervision/Assistance  Equipment Recommendations  Rolling walker (2 wheels)    Recommendations for Other Services       Precautions / Restrictions Precautions Precautions: Fall Precaution Comments: NG tube - clamping trial Restrictions Weight Bearing Restrictions: No     Mobility  Bed Mobility Overal bed mobility: Needs Assistance             General bed mobility comments: in chair when PT arrived    Transfers                   General transfer comment: refused    Ambulation/Gait                    Stairs             Wheelchair Mobility    Modified Rankin (Stroke Patients Only)       Balance Overall balance assessment: Needs assistance Sitting-balance support: Feet supported Sitting balance-Leahy Scale: Good                                      Cognition Arousal/Alertness: Awake/alert Behavior During Therapy: WFL for tasks assessed/performed Overall Cognitive Status: No family/caregiver present to determine baseline cognitive functioning Area of Impairment: Problem solving;Awareness;Safety/judgement;Following commands;Memory;Attention;Orientation                 Orientation Level: Situation Current Attention Level: Selective Memory: Decreased short-term memory Following Commands: Follows one step commands with increased time Safety/Judgement: Decreased awareness of deficits Awareness: Intellectual Problem Solving: Slow processing;Requires verbal cues;Requires tactile cues General Comments: pt is in chair and does not sequence exercises well so PT is instructing each one separately with dense cues        Exercises General Exercises - Lower Extremity Ankle Circles/Pumps: AAROM;5 reps Quad Sets: AROM;10 reps Gluteal Sets: AROM;10 reps Heel Slides: AAROM;10 reps Hip ABduction/ADduction: AAROM;10 reps Straight Leg Raises: AAROM;10 reps    General Comments General comments (skin integrity, edema, etc.): pt is in chair and agrees to do ther ex to BLE's but is not interested in  gait.  Has generalized weakness from bedrest, and was able to demonstrate low but accurate effort to strengthen herself      Pertinent Vitals/Pain Pain Assessment: No/denies pain    Home Living                          Prior Function            PT Goals (current goals can now be found in the care plan section) Acute Rehab PT Goals Patient Stated Goal: get back to work, get home Progress towards PT goals: Progressing toward goals     Frequency    Min 3X/week      PT Plan Current plan remains appropriate    Co-evaluation              AM-PAC PT "6 Clicks" Mobility   Outcome Measure  Help needed turning from your back to your side while in a flat bed without using bedrails?: A Little Help needed moving from lying on your back to sitting on the side of a flat bed without using bedrails?: A Little Help needed moving to and from a bed to a chair (including a wheelchair)?: A Lot Help needed standing up from a chair using your arms (e.g., wheelchair or bedside chair)?: A Lot Help needed to walk in hospital room?: A Lot Help needed climbing 3-5 steps with a railing? : A Lot 6 Click Score: 14    End of Session   Activity Tolerance: Patient limited by fatigue;Patient limited by lethargy Patient left: in bed;with call bell/phone within reach;with bed alarm set Nurse Communication: Mobility status PT Visit Diagnosis: Muscle weakness (generalized) (M62.81);Difficulty in walking, not elsewhere classified (R26.2)     Time: 0160-1093 PT Time Calculation (min) (ACUTE ONLY): 11 min  Charges:  $Therapeutic Exercise: 8-22 mins            Ivar Drape 08/28/2021, 4:50 PM  Samul Dada, PT PhD Acute Rehab Dept. Number: [facility] [id number] and [facility] [phone]

## 2021-08-28 NOTE — Progress Notes (Signed)
Initial Nutrition Assessment  DOCUMENTATION CODES:   Severe malnutrition in context of chronic illness  INTERVENTION:   Advance diet as medically appropriate Encourage good PO intake  Boost Breeze po BID, each supplement provides 250 kcal and 9 grams of protein Continue TPN per Pharmacy Recommend continuing TPN at goal until pt demonstrates ability to eat 60% of needs via PO.  NUTRITION DIAGNOSIS:   Severe Malnutrition related to chronic illness (CHF) as evidenced by severe muscle depletion, severe fat depletion. - Ongoing  GOAL:   Patient will meet greater than or equal to 90% of their needs - Met via TPN  MONITOR:   Diet advancement, Labs, Weight trends  REASON FOR ASSESSMENT:   NPO/Clear Liquid Diet    ASSESSMENT:   70 y.o. male presented to the ED with lower abdominal pain. PMH includes CHF, GERD, and HTN. Pt admitted with enteritis and an ileus; Flu+ on admission.   11/03: NPO; CLD 11/04: NPO 11/06: NG tube placed for LIS 11/09 - s/p ex-lap with lysis of adhesions 11/11 - TPN start 11/12 - TPN increased to goal rate 11/13 - NG tube clamped, later removed 11/14 - clear liquids, full liquids 11/17 - full liquids, SOFT, full liquids 11/18 - SOFT, full liquids, SOFT 11/19 - Dysphagia 3; NGT to LIWS 11/20 - NPO 11/25 - NGT clamping trial 11/26 - diet advanced to CLD 11/28 - NGT unclamped and on LIWS due to distention  Pt reports that he has been doing ok on clear liquids, denies any nausea or vomiting. Pt currently having green/yellow secretions suctioned via NG tube.   PLDN did palpate stomach, pt with distention. Pt stated that this is soft, sometimes his stomach is hard and itches.   Per surgeries note, will either d/c NGT today or continue with clamping trials; has PO intake on hold currently due to LIWS.  Medications reviewed and include: Dulcolax, Reglan, Miralax, Robaxin Labs reviewed: Sodium 134  Diet Order:   Diet Order             Diet clear  liquid Room service appropriate? Yes; Fluid consistency: Thin  Diet effective now                   EDUCATION NEEDS:   No education needs have been identified at this time  Skin:  Skin Assessment: Skin Integrity Issues: Skin Integrity Issues:: Incisions Incisions: Abdomen  Last BM:  08/27/2021  Height:   Ht Readings from Last 1 Encounters:  08/11/21 _0  (1.854 m)    Weight:   Wt Readings from Last 1 Encounters:  08/27/21 66.8 kg    Ideal Body Weight:  83.6 kg  BMI:  Body mass index is 19.43 kg/m.  Estimated Nutritional Needs:   Kcal:  2300-2500  Protein:  115-130 grams  Fluid:  > 2 L    Fajr Fife BS, PLDN Clinical Dietitian See AMiON for contact information.

## 2021-08-28 NOTE — Progress Notes (Signed)
Mobility Specialist Progress Note:   08/28/21 1625  Mobility  Activity Transferred:  Chair to bed  Level of Assistance Standby assist, set-up cues, supervision of patient - no hands on  Assistive Device None  Distance Ambulated (ft) 4 ft  Mobility Response Tolerated well  Mobility performed by Mobility specialist  $Mobility charge 1 Mobility   Pt asx during ambulation. Refused hallway ambulation at this time. Pt back in bed with all needs met.  Nelta Numbers Mobility Specialist  Phone [phone]

## 2021-08-28 NOTE — Progress Notes (Signed)
Occupational Therapy Progress Note  Pt is progressing towards OT goals. During session, pt completed simulated tub transfer with Min guard, declining to attempt in actual tub in therapy gym. Pt would likely benefit from practice with actual tub. Will continue to follow acutely.   08/28/21 1700  OT Visit Information  Last OT Received On 08/28/21  Assistance Needed +1  History of Present Illness 70 y/o male who presents on 08/03/21 due to abdominal pain and diarrhea. CT abdomen/pelvis- dilated fluid and air-filled small intestine consistent with small bowel obstruction. Per surgery, ilieus vs enteritis vs partial SBO. Exploratory lap 11/9.   PMH includes HTN, chronic systolic CHF- EF 25%, prior history of alcoholism  Precautions  Precautions Fall  Precaution Comments NG tube - clamping trial  Restrictions  Weight Bearing Restrictions No  Pain Assessment  Pain Assessment No/denies pain  Cognition  Arousal/Alertness Awake/alert  Behavior During Therapy WFL for tasks assessed/performed  Overall Cognitive Status No family/caregiver present to determine baseline cognitive functioning  ADL  Overall ADL's  Needs assistance/impaired  Tub/ Nurse, learning disability walker (2 wheels)  Tub/Shower Transfer Details (indicate cue type and reason) Suggested going to therapy gym to practice in tub, however pt requesting to stay in room for session. Simulated tub transfer in room by stepping over object. Pt able to complete with Min guard, however would likely benefit from practice with actual tub.  Functional mobility during ADLs Supervision/safety  Bed Mobility  Overal bed mobility Needs Assistance  Bed Mobility Sit to Supine;Supine to Sit  Supine to sit Modified independent (Device/Increase time)  Sit to supine Modified independent (Device/Increase time)  Transfers  Overall transfer level Needs assistance  Equipment used None  Transfers Sit to/from Stand  Sit to Stand Supervision  Balance   Overall balance assessment Needs assistance  Sitting-balance support Feet supported  Sitting balance-Leahy Scale Good  Standing balance support No upper extremity supported;During functional activity  Standing balance-Leahy Scale Fair  OT - End of Session  Equipment Utilized During Treatment Rolling walker (2 wheels)  Activity Tolerance Patient tolerated treatment well  Patient left in bed;with call bell/phone within reach  Nurse Communication Mobility status  OT Assessment/Plan  OT Plan Discharge plan remains appropriate  OT Visit Diagnosis Unsteadiness on feet (R26.81);History of falling (Z91.81);Pain  OT Frequency (ACUTE ONLY) Min 2X/week  Follow Up Recommendations No OT follow up  Assistance recommended at discharge Intermittent Supervision/Assistance  OT Equipment None recommended by OT  AM-PAC OT "6 Clicks" Daily Activity Outcome Measure (Version 2)  Help from another person eating meals? 4  Help from another person taking care of personal grooming? 3  Help from another person toileting, which includes using toliet, bedpan, or urinal? 3  Help from another person bathing (including washing, rinsing, drying)? 3  Help from another person to put on and taking off regular upper body clothing? 4  Help from another person to put on and taking off regular lower body clothing? 3  6 Click Score 20  Progressive Mobility  What is the highest level of mobility based on the progressive mobility assessment? Level 5 (Walks with assist in room/hall) - Balance while stepping forward/back and can walk in room with assist - Complete  Mobility Out of bed for toileting;Out of bed to chair with meals;Ambulated with assistance in room  OT Goal Progression  Progress towards OT goals Progressing toward goals  Acute Rehab OT Goals  Patient Stated Goal get better  OT Goal Formulation With patient  Time For  Goal Achievement 09/05/21  Potential to Achieve Goals Good  ADL Goals  Pt Will Perform Grooming  with modified independence;standing  Pt Will Perform Lower Body Bathing with modified independence;sit to/from stand  Pt Will Perform Lower Body Dressing with modified independence;sit to/from stand  Pt Will Transfer to Toilet with modified independence;ambulating;regular height toilet  Pt Will Perform Tub/Shower Transfer Tub transfer;with modified independence;3 in 1;ambulating  OT Time Calculation  OT Start Time (ACUTE ONLY) 1700  OT Stop Time (ACUTE ONLY) 1714  OT Time Calculation (min) 14 min  OT General Charges  $OT Visit 1 Visit  OT Treatments  $Therapeutic Activity 8-22 mins   Chevonne Bostrom C, OT/L  Acute Rehab [phone]

## 2021-08-28 NOTE — Care Management Important Message (Signed)
Important Message  Patient Details  Name: [name] MRN: [id number] Date of Birth: 12-01-1950   Medicare Important Message Given:  Yes     Wadie Lessen 08/28/2021, 2:02 PM

## 2021-08-29 DIAGNOSIS — K56609 Unspecified intestinal obstruction, unspecified as to partial versus complete obstruction: Secondary | ICD-10-CM | POA: Diagnosis not present

## 2021-08-29 LAB — BASIC METABOLIC PANEL
Anion gap: 7 (ref 5–15)
BUN: 24 mg/dL — ABNORMAL HIGH (ref 8–23)
CO2: 26 mmol/L (ref 22–32)
Calcium: 8.6 mg/dL — ABNORMAL LOW (ref 8.9–10.3)
Chloride: 105 mmol/L (ref 98–111)
Creatinine, Ser: 0.53 mg/dL — ABNORMAL LOW (ref 0.61–1.24)
GFR, Estimated: >60 mL/min (ref >60)
Glucose, Bld: 128 mg/dL — ABNORMAL HIGH (ref 70–99)
Potassium: 4 mmol/L (ref 3.5–5.1)
Sodium: 138 mmol/L (ref 135–145)

## 2021-08-29 LAB — GLUCOSE, CAPILLARY: Glucose-Capillary: 145 mg/dL — ABNORMAL HIGH (ref 70–99)

## 2021-08-29 MED ORDER — TRAVASOL 10 % IV SOLN
INTRAVENOUS | Status: AC
  Filled 2021-08-29: qty 1207.4

## 2021-08-29 NOTE — Progress Notes (Signed)
Mobility Specialist Progress Note:   08/29/21 1000  Mobility  Activity Ambulated in hall  Level of Assistance Standby assist, set-up cues, supervision of patient - no hands on  Assistive Device Other (Comment) (IV Pole)  Distance Ambulated (ft) 600 ft  Mobility Ambulated independently in hallway  Mobility Response Tolerated well  Mobility performed by Mobility specialist  $Mobility charge 1 Mobility   Pt asx during ambulation. NG tube placed back on suction, pt back in bed.   Addison Lank Mobility Specialist  Phone [phone]

## 2021-08-29 NOTE — Progress Notes (Signed)
Patient ID: [name], male   DOB: 1951-04-25, 70 y.o.   MRN: [id number]   Acute Care Surgery Service Progress Note:    Chief Complaint/Subjective: Denies any new events. States he is having flatus, cannot remember if he had a BM yesterday but RN recorded BMx2. Denies feeling bloated/distended.   NG put out 500 cc yesterday AM from around 0830-1200, thick yellow/bilious fluid.  Objective: Vital signs in last 24 hours: Temp:  [97.5 F (36.4 C)-98.8 F (37.1 C)] 98.8 F (37.1 C) (11/29 0524) Pulse Rate:  [78-88] 78 (11/29 0524) Resp:  [18] 18 (11/28 1439) BP: (109-118)/(70-80) 118/74 (11/29 0524) SpO2:  [98 %-100 %] 100 % (11/29 0524) Last BM Date: 08/27/21  Intake/Output from previous day: 11/28 0701 - 11/29 0700 In: 2663.8 [P.O.:600; I.V.:2063.8] Out: 1250 [Urine:750; Emesis/NG output:500] Intake/Output this shift: No intake/output data recorded.  Lungs: cta, nonlabored  Cardiovascular: reg  Abd: soft, incision healed, distended - left hemiabdomen is more distended than right. nontender  Extremities: no edema, +SCDs  Neuro: alert, nonfocal  Lab Results: BMET Recent Labs    08/28/21 0237 08/29/21 0422  NA 134* 138  K 3.5 4.0  CL 104 105  CO2 23 26  GLUCOSE 150* 128*  BUN 25* 24*  CREATININE 0.53* 0.53*  CALCIUM 8.1* 8.6*   LFT Hepatic Function Latest Ref Rng & Units 08/28/2021 08/24/2021 08/21/2021  Total Protein 6.5 - 8.1 g/dL 5.8(L) 6.0(L) 6.0(L)  Albumin 3.5 - 5.0 g/dL 2.6(L) 2.8(L) 2.8(L)  AST 15 - 41 U/L '17 22 20  ' ALT 0 - 44 U/L 38 60(H) 48(H)  Alk Phosphatase 38 - 126 U/L 52 60 40  Total Bilirubin 0.3 - 1.2 mg/dL 0.2(L) 0.4 0.2(L)  Bilirubin, Direct 0.0 - 0.2 mg/dL - - -   Studies/Results:  Anti-infectives: Anti-infectives (From admission, onward)    Start     Dose/Rate Route Frequency Ordered Stop   08/09/21 1000  ceFAZolin (ANCEF) IVPB 2g/100 mL premix  Status:  Discontinued        2 g 200 mL/hr over 30 Minutes Intravenous To Short  Stay 08/09/21 0758 08/10/21 1000   08/04/21 2200  oseltamivir (TAMIFLU) capsule 30 mg        30 mg Oral 2 times daily 08/04/21 0822 08/09/21 0959   08/04/21 1000  oseltamivir (TAMIFLU) capsule 75 mg        75 mg Oral  Once 08/04/21 0825 08/04/21 1116   08/03/21 1645  cefTRIAXone (ROCEPHIN) 2 g in sodium chloride 0.9 % 100 mL IVPB  Status:  Discontinued        2 g 200 mL/hr over 30 Minutes Intravenous Every 24 hours 08/03/21 1631 08/06/21 1519   08/03/21 1645  metroNIDAZOLE (FLAGYL) IVPB 500 mg  Status:  Discontinued        500 mg 100 mL/hr over 60 Minutes Intravenous Every 12 hours 08/03/21 1631 08/06/21 1519       Medications: Scheduled Meds:  bisacodyl  10 mg Rectal Daily   Chlorhexidine Gluconate Cloth  6 each Topical Daily   enoxaparin (LOVENOX) injection  40 mg Subcutaneous Q24H   feeding supplement  1 Container Oral BID BM   lip balm  1 application Topical BID   mouth rinse  15 mL Mouth Rinse BID   polyethylene glycol  17 g Oral Daily   sodium chloride flush  10-40 mL Intracatheter Q12H   sodium chloride flush  3 mL Intravenous Q12H   Continuous Infusions:  sodium chloride  methocarbamol (ROBAXIN) IV     PRN Meds:.sodium chloride, acetaminophen, albuterol, alum & mag hydroxide-simeth, bisacodyl, enalaprilat, hydrALAZINE, magic mouthwash, menthol-cetylpyridinium, methocarbamol (ROBAXIN) IV **OR** methocarbamol, metoCLOPramide (REGLAN) injection, metoprolol tartrate, morphine injection, ondansetron **OR** ondansetron (ZOFRAN) IV, phenol, prochlorperazine, simethicone, sodium chloride flush, sodium chloride flush  Assessment/Plan: Patient Active Problem List   Diagnosis Date Noted   SBO (small bowel obstruction) ([place]) 08/12/2021   Aortic atherosclerosis ([place]) 08/12/2021   Protein-calorie malnutrition, severe 08/10/2021   Enteritis 08/03/2021   BPH with obstruction/lower urinary tract symptoms 05/16/2021   Wrist pain, acute, left 03/21/2021   Lumbar radiculopathy, right  01/03/2018   Right hip pain 12/03/2017   Benign prostatic hyperplasia with weak urinary stream 06/07/2017   Essential hypertension 04/26/2017   Seasonal allergies 05/25/2016   Severe major depression, single episode, without psychotic features ([place]) 03/04/2016   Alcohol use disorder, severe, dependence ([place]) 03/04/2016   Cocaine use disorder, moderate, dependence ([place]) 03/04/2016   NICM (nonischemic cardiomyopathy) ([place]) 94/80/1655   Chronic systolic CHF (congestive heart failure) ([place]) 01/23/2016   Epigastric abdominal pain 37/48/2707   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   Diarrhea 06/13/2011   DERMATOPHYTOSIS OF [place] AND [place] 03/18/2008   TOBACCO USER 01/02/2008   Alcohol abuse 11/28/2006   SBO POD 20 s/p ex lap LOA by Dr. Grandville Silos 11/9 with findings of dense adhesion near distal ileum down to the mesentery - afebrile, VSS - NGT initially removed 11/13 but replaced 11/19 due to persistent ileus w/ intolerance of PO.  - CT repeated 11/21 without abscess or definite transition zone, dilated loops of small bowel with air fluid levels consistent with ileus. - SBO protocol started 11/23; KUB 11/25 showed contrast in Rt colon, KUB 11/27 w/ contrast in rectum but persistently dilated small bowel . - continue NG clamp trials. Continue reglan. Place to suction this AM and check residuals. Patient advised to stop PO intake during this evaluation. Will either remove NG this afternoon or continue clamping trials.  continue PICC and full support TPN  - mobilize/OOB    FEN - NPO,  NG clamp trials; TPN, A1c 6/1%, prealbumin 11/19 28.6 ID - ancef periop VTE - SCDs, lovenox Foley - placed periop 11/9, d/c 11/10   AKI - resolved HTN HLD COPD CHF   LOS: 19     LOS: 25 days    [place] [facility] [phone] [facility]

## 2021-08-29 NOTE — Progress Notes (Signed)
Physical Therapy Treatment Patient Details Name: [name] MRN: [id number] DOB: Sep 20, 1951 Today's Date: 08/29/2021   History of Present Illness 70 y/o male who presents on 08/03/21 due to abdominal pain and diarrhea. CT abdomen/pelvis- dilated fluid and air-filled small intestine consistent with small bowel obstruction. Per surgery, ilieus vs enteritis vs partial SBO. Exploratory lap 11/9.   PMH includes HTN, chronic systolic CHF- EF 57%, prior history of alcoholism    PT Comments    Patient progressing and able to demonstrate mobility unaided at mod I level.  PT goals met and pt safe with using walker in open environment.  PT to sign off and pt will continue to mobilize with mobility specialist.  Feel follow up HHPT still indicated as would like safety eval at least for home.   Recommendations for follow up therapy are one component of a multi-disciplinary discharge planning process, led by the attending physician.  Recommendations may be updated based on patient status, additional functional criteria and insurance authorization.  Follow Up Recommendations  Home health PT     Assistance Recommended at Discharge    Equipment Recommendations  Rolling walker (2 wheels)    Recommendations for Other Services       Precautions / Restrictions Precautions Precaution Comments: NG tube     Mobility  Bed Mobility Overal bed mobility: Independent                  Transfers Overall transfer level: Modified independent Equipment used: Rolling walker (2 wheels)   Sit to Stand: Modified independent (Device/Increase time)                Ambulation/Gait Ambulation/Gait assistance: Modified independent (Device/Increase time) Gait Distance (Feet): 400 Feet Assistive device: Rolling walker (2 wheels) Gait Pattern/deviations: Step-through pattern;Trunk flexed       General Gait Details: reports they ordered him a walker for home,  Patient used to IV pole, but  encouraged to use RW to make sure using correctly, no assist and mobilized without issues in open environment   Stairs             Wheelchair Mobility    Modified Rankin (Stroke Patients Only)       Balance     Sitting balance-Leahy Scale: Good       Standing balance-Leahy Scale: Fair                              Cognition Arousal/Alertness: Awake/alert Behavior During Therapy: WFL for tasks assessed/performed Overall Cognitive Status: Within Functional Limits for tasks assessed                                          Exercises      General Comments General comments (skin integrity, edema, etc.): Discussed continued walking with mobility specialist and that PT would sign off as pt met goals.      Pertinent Vitals/Pain Pain Assessment: No/denies pain    Home Living                          Prior Function            PT Goals (current goals can now be found in the care plan section) Progress towards PT goals: Goals met/education completed, patient discharged from PT  Frequency    Min 3X/week      PT Plan Current plan remains appropriate    Co-evaluation              AM-PAC PT "6 Clicks" Mobility   Outcome Measure  Help needed turning from your back to your side while in a flat bed without using bedrails?: None Help needed moving from lying on your back to sitting on the side of a flat bed without using bedrails?: None Help needed moving to and from a bed to a chair (including a wheelchair)?: None Help needed standing up from a chair using your arms (e.g., wheelchair or bedside chair)?: None Help needed to walk in hospital room?: None Help needed climbing 3-5 steps with a railing? : A Little 6 Click Score: 23    End of Session   Activity Tolerance: Patient tolerated treatment well Patient left: in chair;with call bell/phone within reach   PT Visit Diagnosis: Muscle weakness (generalized)  (M62.81);Difficulty in walking, not elsewhere classified (R26.2)     Time: 1600-1620 PT Time Calculation (min) (ACUTE ONLY): 20 min  Charges:  $Gait Training: 8-22 mins                     Magda Kiel, PT Acute Rehabilitation Services Pager:[phone] Office:[phone] 08/29/2021    Reginia Naas 08/29/2021, 4:43 PM

## 2021-08-29 NOTE — Progress Notes (Signed)
PHARMACY - TOTAL PARENTERAL NUTRITION CONSULT NOTE  Indication: Small bowel obstruction, prolonged ileus  Patient Measurements: Height: 6\' 1"  (185.4 cm) Weight: 66.8 kg (147 lb 4.3 oz) IBW/kg (Calculated) : 79.9 TPN AdjBW (KG): 70 Body mass index is 19.43 kg/m. Usual Weight: 74 kg  Assessment:  70 y.o. male with PMH significant for HTN, CHF with LVEF ~25%, GERD, BPH, prior history of alcoholism who presented with complaints of lower abdominal pain, found to have SBO. Patient has remained NPO/CLD, now for more than 7 days and at risk of becoming severely malnourished. Pharmacy is consulted to manage TPN.   Per RD's conversation with patient, reported he was eating well PTA. States he would have abdominal pain when eating. Per patient 11/15, he sometimes skips meals at home even though his wife is a good cook because he just doesn't feel like eating.   Per chart review, NGT removed 11/13 but replaced 11/19 due to persistent ileus w/ intolerance of PO. SBO protocol restarted 11/23. NGT placed to suction 11/29 AM. Patient advised to hold PO intake during this evaluation. Plan will be to remove NGT 11/29 PM or continue NGT clamping trials.   Glucose / Insulin: BG < 180, no hx DM, A1c 6.1% - CBGs wnl, not on insulin Electrolytes: K 4.0 (goal >/= 4 with ileus), Na 138, CoCa 9.7, others stable WNL Renal: SCr < 1 stable, BUN 25 Hepatic: LFTs / TG / tbili WNL, albumin 2.6, prealbumin WNL (11/24) Intake / Output; MIVF: UOP 0.75 ml/kg/hr (down slightly), NGT 11-21-1977 thick yellow/bilious fluid, LBM 11/28 x2  GI Imaging:  11/3 CT abd: SBO  11/4 Abd XR: small partial SBO 11/6 Abd XR: Persistent SBO  11/7 Abd XR: Persistent SBO  11/8 Abd XR: SBO with interval worsening 11/9 Abd XR: post-op free air, small bowel dilatation 11/21 CT - no abscess or obstruction, consistent with ileus 11/23 Abd XR: incr gaseous distention, may reflect developing obstruction or ileus  11/25 Abd XR: contrast in R colon   GI Surgeries / Procedures:  11/9 ex-lap with LOA   Central access: PICC placed 08/10/21 TPN start date:  08/11/21  Nutritional Goals: Goal continuous TPN rate is 90 mL/hr (provides 118 g of protein and 2334 kcals per day)  RD Estimated Needs Total Energy Estimated Needs: 2300-2500 Total Protein Estimated Needs: 115-130 grams Total Fluid Estimated Needs: > 2 L  Current Nutrition:  Cyclic 12-hr TPN (back to full support 11/20 with NPO status and NGT to LIWS) 1126 CLD   Plan:   - Cycle TPN over 12 hours (rate 98 - 196 ml/hr, GIR 4 - 7.99 mg/kg/min) - provides 121g AA, 345g CHO, 65g ILE, and 2302 kCal, meeting 100% of needs - Electrolytes in TPN: Na 100 mEq/L, K 30 mEq/L, Ca 5 mEq/L, Mg 5 mEq/L, Phos 73mmol/L; Cl:Ac 1:1 - Add daily MVI and trace elements to TPN  - daily CBG check, adjust as needed - Monitor TPN labs Mon/Thurs - F/u PO intake, planning to continue TPN at goal until patient is able to consume 60% PO   Thank you for allowing pharmacy to be a part of this patient's care.  10m, PharmD Clinical Pharmacist

## 2021-08-29 NOTE — Plan of Care (Signed)
  Problem: Acute Rehab PT Goals(only PT should resolve) Goal: Pt Will Go Supine/Side To Sit Outcome: Completed/Met Goal: Pt Will Go Sit To Supine/Side Outcome: Completed/Met Goal: Patient Will Transfer Sit To/From Stand Outcome: Completed/Met Goal: Pt Will Ambulate Outcome: Completed/Met PT goals met, with d/c PT   Magda Kiel, PT Acute Rehabilitation Services [phone] [phone] 08/29/2021

## 2021-08-29 NOTE — Progress Notes (Signed)
Mobility Specialist Progress Note:   08/29/21 1700  Mobility  Activity Transferred:  Chair to bed  Level of Assistance Standby assist, set-up cues, supervision of patient - no hands on  Assistive Device None  Distance Ambulated (ft) 5 ft  Mobility Response Tolerated well  Mobility performed by Mobility specialist  $Mobility charge 1 Mobility   Pt asx during transfer. Back in bed with call bell in reach.  Addison Lank Mobility Specialist  Phone [phone]

## 2021-08-29 NOTE — Progress Notes (Signed)
PROGRESS NOTE        PATIENT DETAILS Name: [name] Age: 70 y.o. Sex: male Date of Birth: [birth year] Admit Date: 08/03/2021 Admitting Physician Darlin Drop, DO JJO:ACZYSAY, Janene Harvey, NP  Brief Narrative: Patient is a 70 y.o. male with history of HFrEF, EtOH use-who presented with SBO-subsequently underwent exploratory laparotomy on 11/9-further hospital course complicated by prolonged ileus.  See below for further details.  Subjective: No chest pain-no shortness of breath. No bed-no shortness of  Objective: Vitals: Blood pressure 111/71, pulse 74, temperature 98 F (36.7 C), temperature source Oral, resp. rate 18, height 6\' 1"  (1.854 m), weight 66.8 kg, SpO2 100 %.   Exam: Gen Exam:Alert awake-not in any distress HEENT:atraumatic, normocephalic Chest: B/L clear to auscultation anteriorly CVS:S1S2 regular Abdomen:soft non tender, non distended Extremities:no edema Neurology: Non focal Skin: no rash   Pertinent Labs/Radiology: Recent Labs  Lab 08/24/21 0346 08/25/21 0349 08/28/21 0237 08/29/21 0422  NA 140   < > 134* 138  K 3.7   < > 3.5 4.0  CREATININE 0.56*   < > 0.53* 0.53*  AST 22  --  17  --   ALT 60*  --  38  --   ALKPHOS 60  --  52  --   BILITOT 0.4  --  0.2*  --    < > = values in this interval not displayed.     Assessment/Plan: SBO-s/p exploratory laparotomy on 11/9-postop course complicated by prolonged ileus: General surgery following-NG tube in place-on TPN.  Will defer further care to general surgery.  HFrEF (EF 20% on TTE in 2017): Volume status stable/compensated.  Does not require diuretics.  Influenza A: Completed course of Tamiflu-he appears asymptomatic.  HTN: BP stable-resume lisinopril when oral intake resumes.  HLD: Resume Crestor when oral intake resumes  CAD: No anginal symptoms-plan is to resume aspirin when oral intake can be resumed.  Nutrition Status: Nutrition Problem: Severe  Malnutrition Etiology: chronic illness (CHF) Signs/Symptoms: severe muscle depletion, severe fat depletion Interventions: TPN  BMI Estimated body mass index is 19.43 kg/m as calculated from the following:   Height as of this encounter: 6\' 1"  (1.854 m).   Weight as of this encounter: 66.8 kg.   Procedures: None Consults: CCS DVT Prophylaxis: Lovenox Code Status:Full code  Family Communication: None at bedside  Time spent: 15- minutes-Greater than 50% of this time was spent in counseling, explanation of diagnosis, planning of further management, and coordination of care.   Disposition Plan: Status is: Inpatient  Remains inpatient appropriate because: SBO-s/p laparotomy with prolonged ileus-on TPN-NG tube still in place.   Diet: Diet Order             Diet clear liquid Room service appropriate? Yes; Fluid consistency: Thin  Diet effective now                     Antimicrobial agents: Anti-infectives (From admission, onward)    Start     Dose/Rate Route Frequency Ordered Stop   08/09/21 1000  ceFAZolin (ANCEF) IVPB 2g/100 mL premix  Status:  Discontinued        2 g 200 mL/hr over 30 Minutes Intravenous To Short Stay 08/09/21 0758 08/10/21 1000   08/04/21 2200  oseltamivir (TAMIFLU) capsule 30 mg        30 mg Oral 2 times daily  08/04/21 0822 08/09/21 0959   08/04/21 1000  oseltamivir (TAMIFLU) capsule 75 mg        75 mg Oral  Once 08/04/21 0825 08/04/21 1116   08/03/21 1645  cefTRIAXone (ROCEPHIN) 2 g in sodium chloride 0.9 % 100 mL IVPB  Status:  Discontinued        2 g 200 mL/hr over 30 Minutes Intravenous Every 24 hours 08/03/21 1631 08/06/21 1519   08/03/21 1645  metroNIDAZOLE (FLAGYL) IVPB 500 mg  Status:  Discontinued        500 mg 100 mL/hr over 60 Minutes Intravenous Every 12 hours 08/03/21 1631 08/06/21 1519        MEDICATIONS: Scheduled Meds:  bisacodyl  10 mg Rectal Daily   Chlorhexidine Gluconate Cloth  6 each Topical Daily   enoxaparin  (LOVENOX) injection  40 mg Subcutaneous Q24H   feeding supplement  1 Container Oral BID BM   lip balm  1 application Topical BID   mouth rinse  15 mL Mouth Rinse BID   polyethylene glycol  17 g Oral Daily   sodium chloride flush  10-40 mL Intracatheter Q12H   sodium chloride flush  3 mL Intravenous Q12H   Continuous Infusions:  sodium chloride     methocarbamol (ROBAXIN) IV     TPN CYCLIC-ADULT (ION)     PRN Meds:.sodium chloride, acetaminophen, albuterol, alum & mag hydroxide-simeth, bisacodyl, enalaprilat, hydrALAZINE, magic mouthwash, menthol-cetylpyridinium, methocarbamol (ROBAXIN) IV **OR** methocarbamol, metoCLOPramide (REGLAN) injection, metoprolol tartrate, morphine injection, ondansetron **OR** ondansetron (ZOFRAN) IV, phenol, prochlorperazine, simethicone, sodium chloride flush, sodium chloride flush   I have personally reviewed following labs and imaging studies  LABORATORY DATA: CBC: No results for input(s): WBC, NEUTROABS, HGB, HCT, MCV, PLT in the last 168 hours.  Basic Metabolic Panel: Recent Labs  Lab 08/23/21 0133 08/24/21 0346 08/25/21 0349 08/28/21 0237 08/29/21 0422  NA 140 140 142 134* 138  K 3.6 3.7 4.0 3.5 4.0  CL 107 106 108 104 105  CO2 27 29 25 23 26   GLUCOSE 163* 171* 150* 150* 128*  BUN 27* 26* 29* 25* 24*  CREATININE 0.62 0.56* 0.59* 0.53* 0.53*  CALCIUM 8.8* 8.8* 8.7* 8.1* 8.6*  MG 2.0 2.1  --  1.8  --   PHOS 3.9 3.6  --  3.3  --      GFR: Estimated Creatinine Clearance: 82.3 mL/min (A) (by C-G formula based on SCr of 0.53 mg/dL (L)).  Liver Function Tests: Recent Labs  Lab 08/24/21 0346 08/28/21 0237  AST 22 17  ALT 60* 38  ALKPHOS 60 52  BILITOT 0.4 0.2*  PROT 6.0* 5.8*  ALBUMIN 2.8* 2.6*    No results for input(s): LIPASE, AMYLASE in the last 168 hours. No results for input(s): AMMONIA in the last 168 hours.  Coagulation Profile: No results for input(s): INR, PROTIME in the last 168 hours.  Cardiac Enzymes: No results  for input(s): CKTOTAL, CKMB, CKMBINDEX, TROPONINI in the last 168 hours.  BNP (last 3 results) No results for input(s): PROBNP in the last 8760 hours.  Lipid Profile: Recent Labs    08/28/21 0237  TRIG 49     Thyroid Function Tests: No results for input(s): TSH, T4TOTAL, FREET4, T3FREE, THYROIDAB in the last 72 hours.  Anemia Panel: No results for input(s): VITAMINB12, FOLATE, FERRITIN, TIBC, IRON, RETICCTPCT in the last 72 hours.  Urine analysis:    Component Value Date/Time   COLORURINE YELLOW 12/06/2007 2258   APPEARANCEUR Cloudy (A) 10/28/2015 1615  LABSPEC 1.020 05/08/2011 1016   PHURINE 5.5 05/08/2011 1016   GLUCOSEU Negative 10/28/2015 1615   HGBUR SMALL (A) 05/08/2011 1016   BILIRUBINUR Negative 10/28/2015 1615   KETONESUR NEGATIVE 05/08/2011 1016   PROTEINUR Trace 10/28/2015 1615   PROTEINUR NEGATIVE 05/08/2011 1016   UROBILINOGEN 0.2 05/08/2011 1016   NITRITE Positive (A) 10/28/2015 1615   NITRITE NEGATIVE 05/08/2011 1016   LEUKOCYTESUR Negative 10/28/2015 1615    Sepsis Labs: Lactic Acid, Venous No results found for: LATICACIDVEN  MICROBIOLOGY: No results found for this or any previous visit (from the past 240 hour(s)).  RADIOLOGY STUDIES/RESULTS: No results found.   LOS: 25 days   Jeoffrey Massed, MD  [facility]    To contact the attending provider between 7A-7P or the covering provider during after hours 7P-7A, please log into the web site [email or web address] and access using universal [place] password for that web site. If you do not have the password, please call the hospital operator.  08/29/2021, 1:52 PM

## 2021-08-30 DIAGNOSIS — K56609 Unspecified intestinal obstruction, unspecified as to partial versus complete obstruction: Secondary | ICD-10-CM | POA: Diagnosis not present

## 2021-08-30 LAB — BASIC METABOLIC PANEL
Anion gap: 5 (ref 5–15)
BUN: 22 mg/dL (ref 8–23)
CO2: 27 mmol/L (ref 22–32)
Calcium: 8.5 mg/dL — ABNORMAL LOW (ref 8.9–10.3)
Chloride: 103 mmol/L (ref 98–111)
Creatinine, Ser: 0.47 mg/dL — ABNORMAL LOW (ref 0.61–1.24)
GFR, Estimated: >60 mL/min (ref >60)
Glucose, Bld: 129 mg/dL — ABNORMAL HIGH (ref 70–99)
Potassium: 4 mmol/L (ref 3.5–5.1)
Sodium: 135 mmol/L (ref 135–145)

## 2021-08-30 LAB — GLUCOSE, CAPILLARY
Glucose-Capillary: 106 mg/dL — ABNORMAL HIGH (ref 70–99)
Glucose-Capillary: 91 mg/dL (ref 70–99)
Glucose-Capillary: 72 mg/dL (ref 70–99)
Glucose-Capillary: 163 mg/dL — ABNORMAL HIGH (ref 70–99)
Glucose-Capillary: 158 mg/dL — ABNORMAL HIGH (ref 70–99)

## 2021-08-30 MED ORDER — TRAVASOL 10 % IV SOLN
INTRAVENOUS | Status: AC
  Filled 2021-08-30: qty 1207.4

## 2021-08-30 MED ORDER — ENSURE ENLIVE PO LIQD
237.0000 mL | Freq: Three times a day (TID) | ORAL | Status: DC
  Administered 2021-08-30 – 2021-08-31 (×3): 237 mL via ORAL

## 2021-08-30 MED ORDER — ENSURE ENLIVE PO LIQD
237.0000 mL | Freq: Two times a day (BID) | ORAL | Status: DC
  Administered 2021-08-30: 237 mL via ORAL

## 2021-08-30 NOTE — Progress Notes (Signed)
Nutrition Follow-up  DOCUMENTATION CODES:  Severe malnutrition in context of chronic illness  INTERVENTION:  Continue to advance diet as tolerated and as medically able.  Increase Ensure from BID to QID.  Continue TPN until patient eating consistently >50% of soft diet.  Start 48-hour calorie count. RD to follow-up on day 1 results tomorrow, 12/1.  Encourage PO and supplement intake.  Assist with ordering meals if needed.  NUTRITION DIAGNOSIS:  Severe Malnutrition related to chronic illness (CHF) as evidenced by severe muscle depletion, severe fat depletion. - ongoing  GOAL:  Patient will meet greater than or equal to 90% of their needs. - met with TPN  MONITOR:  Diet advancement, Labs, Weight trends  REASON FOR ASSESSMENT:  NPO/Clear Liquid Diet    ASSESSMENT:  70 y.o. male presented to the ED with lower abdominal pain. PMH includes CHF, GERD, and HTN. Pt admitted with enteritis and an ileus; Flu+ on admission. 11/03 - NPO; CLD 11/04 - NPO 11/06 - NG tube placed for LIS 11/09 - s/p ex-lap with lysis of adhesions 11/11 - TPN start 11/12 - TPN increased to goal rate 11/13 - NG tube clamped, later removed 11/14 - clear liquids, full liquids 11/17 - full liquids, SOFT, full liquids 11/18 - SOFT, full liquids, SOFT 11/19 - Dysphagia 3; NGT to LIWS 11/20 - NPO 11/25 - NGT clamping trial 11/26 - diet advanced to CLD 11/28 - NGT unclamped and on LIWS due to distention  11/30 - diet advanced to FLD; calorie count started  Spoke with pt at bedside. Explained that pt will be undergoing a calorie count and that we are going to attempt to wean TPN if he does well with eating by mouth.  RD offered EN as another option if patient continues to not to eat well. Pt denied wanting another tube. RD would recommend starting EN if gut works to prevent any dysmotility if patient does not eat well by mouth.  Spoke about foods patient likes. Pt does not seem to like soups, but is  willing to do ice cream, grits, and Ensure.  Patient reports low literacy. RD put in care order for nursing team to help with ordering meals and for room service assistance.   RD modified calorie count with instructions and put another care order in with instructions to remind nursing every 4 hours for accurate documentation to be obtained.  RD to follow up on calorie count tomorrow, 12/1 for day 1 results.  Per Pharmacy note, Cycle TPN over 12 hours (rate 98-196 ml/hr, GIR 3.91-7.82 mg/kg/min) - provides 121g AA, 345g CHO, 65g ILE, and 2302 kCal, meeting 100% of needs - Electrolytes in TPN: Na 100 mEq/L, K 30 mEq/L, Ca 5 mEq/L, Mg 5 mEq/L, Phos 25mol/L; Cl:Ac 1:1 - Add daily MVI and trace elements to TPN   Supplements: Ensure BID  Medications: reviewed; Dulcolax, Miralax  Labs: reviewed; CBG 84-160  Diet Order:   Diet Order             Diet full liquid Room service appropriate? Yes with Assist; Fluid consistency: Thin  Diet effective now                  EDUCATION NEEDS:  No education needs have been identified at this time  Skin:  Skin Assessment: Skin Integrity Issues: Skin Integrity Issues:: Incisions Incisions: Abdomen  Last BM:  08/28/21 - x2  Height:  Ht Readings from Last 1 Encounters:  08/11/21 '6\' 1"'  (1.854 m)   Weight:  Wt Readings from Last 1 Encounters:  08/27/21 66.8 kg   BMI:  Body mass index is 19.43 kg/m.  Estimated Nutritional Needs:  Kcal:  2300-2500 Protein:  115-130 grams Fluid:  > 2 L  Derrel Nip, RD, LDN (she/her/hers) Clinical Inpatient Dietitian RD Pager/After-Hours/Weekend Pager # in [place]

## 2021-08-30 NOTE — Progress Notes (Signed)
Mobility Specialist Progress Note:   08/30/21 1000  Mobility  Activity Ambulated in hall  Level of Assistance Modified independent, requires aide device or extra time  Assistive Device Other (Comment) (IV Pole)  Distance Ambulated (ft) 570 ft  Mobility Ambulated independently in hallway  Mobility Response Tolerated well  Mobility performed by Mobility specialist  $Mobility charge 1 Mobility   Pt asx during ambulation. Left with NT in BR to wash up. Will follow up later today for another ambulation.   Addison Lank Mobility Specialist  Phone [phone]

## 2021-08-30 NOTE — Progress Notes (Signed)
Mobility Specialist Progress Note:   08/30/21 1600  Mobility  Activity Ambulated in hall  Level of Assistance Standby assist, set-up cues, supervision of patient - no hands on  Assistive Device None;Other (Comment) (IV Pole)  Distance Ambulated (ft) 570 ft  Mobility Ambulated with assistance in hallway;Ambulated independently in hallway  Mobility Response Tolerated well  Mobility performed by Mobility specialist  $Mobility charge 1 Mobility   Pt asx during ambulation. Successfully attempted to amb without AD for ~70 ft. Pt back in bed with all needs met.   Nelta Numbers Mobility Specialist  Phone [phone]

## 2021-08-30 NOTE — Progress Notes (Addendum)
I personally saw the patient and performed a substantive portion of this encounter, including a complete performance of at least one of the key components (MDM, Hx and/or Exam), in conjunction with the Advanced Practice Provider Obie Dredge, PA-C.  NG removed - big day.  Lets see how he does.    Patient ID: [name], male   DOB: 1950/12/04, 70 y.o.   MRN: [id number]   Acute Care Surgery Service Progress Note:    Chief Complaint/Subjective: Cc NG tube irritating his nose. Denies nausea or emesis with NG clamped. Reports flatus. Tolerating liquids. Last BM was over 24 hours ago. Mobilizing with assistance.  Objective: Vital signs in last 24 hours: Temp:  [98 F (36.7 C)-98.8 F (37.1 C)] 98 F (36.7 C) (11/30 0815) Pulse Rate:  [66-67] 66 (11/30 0815) Resp:  [17] 17 (11/30 0815) BP: (117-123)/(72-80) 117/72 (11/30 0815) SpO2:  [98 %-100 %] 100 % (11/30 0815) Last BM Date: 08/27/21  Intake/Output from previous day: 11/29 0701 - 11/30 0700 In: 2313.3 [P.O.:480; I.V.:1803.3; NG/GT:30] Out: 1750 [Urine:1250; Emesis/NG output:500] Intake/Output this shift: Total I/O In: 10 [I.V.:10] Out: -   Lungs: cta, nonlabored  Cardiovascular: reg  Abd: soft, incision healed, nontender, mild distention -less compared to previous exam  Extremities: no edema, +SCDs  Neuro: alert, nonfocal  Lab Results: BMET Recent Labs    08/29/21 0422 08/30/21 0559  NA 138 135  K 4.0 4.0  CL 105 103  CO2 26 27  GLUCOSE 128* 129*  BUN 24* 22  CREATININE 0.53* 0.47*  CALCIUM 8.6* 8.5*   LFT Hepatic Function Latest Ref Rng & Units 08/28/2021 08/24/2021 08/21/2021  Total Protein 6.5 - 8.1 g/dL 5.8(L) 6.0(L) 6.0(L)  Albumin 3.5 - 5.0 g/dL 2.6(L) 2.8(L) 2.8(L)  AST 15 - 41 U/L _0 ALT 0 - 44 U/L 38 60(H) 48(H)  Alk Phosphatase 38 - 126 U/L 52 60 40  Total Bilirubin 0.3 - 1.2 mg/dL 0.2(L) 0.4 0.2(L)  Bilirubin, Direct 0.0 - 0.2 mg/dL - - -    Studies/Results:  Anti-infectives: Anti-infectives (From admission, onward)    Start     Dose/Rate Route Frequency Ordered Stop   08/09/21 1000  ceFAZolin (ANCEF) IVPB 2g/100 mL premix  Status:  Discontinued        2 g 200 mL/hr over 30 Minutes Intravenous To Short Stay 08/09/21 0758 08/10/21 1000   08/04/21 2200  oseltamivir (TAMIFLU) capsule 30 mg        30 mg Oral 2 times daily 08/04/21 0822 08/09/21 0959   08/04/21 1000  oseltamivir (TAMIFLU) capsule 75 mg        75 mg Oral  Once 08/04/21 0825 08/04/21 1116   08/03/21 1645  cefTRIAXone (ROCEPHIN) 2 g in sodium chloride 0.9 % 100 mL IVPB  Status:  Discontinued        2 g 200 mL/hr over 30 Minutes Intravenous Every 24 hours 08/03/21 1631 08/06/21 1519   08/03/21 1645  metroNIDAZOLE (FLAGYL) IVPB 500 mg  Status:  Discontinued        500 mg 100 mL/hr over 60 Minutes Intravenous Every 12 hours 08/03/21 1631 08/06/21 1519       Medications: Scheduled Meds:  bisacodyl  10 mg Rectal Daily   Chlorhexidine Gluconate Cloth  6 each Topical Daily   enoxaparin (LOVENOX) injection  40 mg Subcutaneous Q24H   feeding supplement  1 Container Oral BID BM   lip balm  1 application Topical BID   mouth rinse  15 mL Mouth Rinse BID   polyethylene glycol  17 g Oral Daily   sodium chloride flush  10-40 mL Intracatheter Q12H   sodium chloride flush  3 mL Intravenous Q12H   Continuous Infusions:  sodium chloride     methocarbamol (ROBAXIN) IV     PRN Meds:.sodium chloride, acetaminophen, albuterol, alum & mag hydroxide-simeth, bisacodyl, enalaprilat, hydrALAZINE, magic mouthwash, menthol-cetylpyridinium, methocarbamol (ROBAXIN) IV **OR** methocarbamol, metoCLOPramide (REGLAN) injection, metoprolol tartrate, morphine injection, ondansetron **OR** ondansetron (ZOFRAN) IV, phenol, prochlorperazine, simethicone, sodium chloride flush, sodium chloride flush  Assessment/Plan: Patient Active Problem List   Diagnosis Date Noted   SBO (small bowel  obstruction) ([place]) 08/12/2021   Aortic atherosclerosis ([place]) 08/12/2021   Protein-calorie malnutrition, severe 08/10/2021   Enteritis 08/03/2021   BPH with obstruction/lower urinary tract symptoms 05/16/2021   Wrist pain, acute, left 03/21/2021   Lumbar radiculopathy, right 01/03/2018   Right hip pain 12/03/2017   Benign prostatic hyperplasia with weak urinary stream 06/07/2017   Essential hypertension 04/26/2017   Seasonal allergies 05/25/2016   Severe major depression, single episode, without psychotic features ([place]) 03/04/2016   Alcohol use disorder, severe, dependence ([place]) 03/04/2016   Cocaine use disorder, moderate, dependence ([place]) 03/04/2016   NICM (nonischemic cardiomyopathy) ([place]) 67/61/9509   Chronic systolic CHF (congestive heart failure) ([place]) 01/23/2016   Epigastric abdominal pain 32/67/1245   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   Diarrhea 06/13/2011   DERMATOPHYTOSIS OF [place] AND [place] 03/18/2008   TOBACCO USER 01/02/2008   Alcohol abuse 11/28/2006   SBO POD 20 s/p ex lap LOA by Dr. Grandville Silos 11/9 with findings of dense adhesion near distal ileum down to the mesentery - afebrile, VSS - NGT initially removed 11/13 but replaced 11/19 due to persistent ileus w/ intolerance of PO.  - CT repeated 11/21 without abscess or definite transition zone, dilated loops of small bowel with air fluid levels consistent with ileus. - SBO protocol started 11/23; KUB 11/25 showed contrast in Rt colon, KUB 11/27 w/ contrast in rectum but persistently dilated small bowel . - slow resolution of ileus, has done ok with clamp trials last 3-4 days. REMOVE NG tube and start - mobilize/OOB    FEN - NPO, remove NG tube. Advance to FLD. Calorie count, likely decrease TPN support or stop tomorrow 12/1. ID - ancef periop VTE - SCDs, lovenox Foley - placed periop 11/9, d/c 11/10   AKI - resolved HTN HLD COPD CHF   LOS: 19     LOS: 26 days    Obie Dredge, PA-C General & Trauma  Surgery [phone] [facility]

## 2021-08-30 NOTE — Progress Notes (Signed)
Pt is complaining of burning and irritating pain to right nare where NGT is placed, rated as 7/10. Pain med given as ordered.   Educated pt about the importance of the NGT.   Pt would like to see the Doctor to have the NGT removed.

## 2021-08-30 NOTE — Progress Notes (Signed)
PHARMACY - TOTAL PARENTERAL NUTRITION CONSULT NOTE  Indication: Small bowel obstruction, prolonged ileus  Patient Measurements: Height: 6\' 1"  (185.4 cm) Weight: 66.8 kg (147 lb 4.3 oz) IBW/kg (Calculated) : 79.9 TPN AdjBW (KG): 70 Body mass index is 19.43 kg/m. Usual Weight: 74 kg  Assessment:  70 y.o. male with PMH significant for HTN, CHF with LVEF ~25%, GERD, BPH, prior history of alcoholism who presented with complaints of lower abdominal pain, found to have SBO. Patient has remained NPO/CLD, now for more than 7 days and at risk of becoming severely malnourished. Pharmacy is consulted to manage TPN.   Per RD's conversation with patient, reported he was eating well PTA. States he would have abdominal pain when eating. Per patient 11/15, he sometimes skips meals at home even though his wife is a good cook because he just doesn't feel like eating.  Glucose / Insulin: no hx DM, A1c 6.1% - CBGs controlled, not on insulin Electrolytes: Mag 1.8 (last value from 11/28; goal >/=2 with ileus), others stable WNL Renal: SCr < 1 stable, BUN WNL Hepatic: LFTs / TG / tbili WNL, albumin 2.6, prealbumin WNL (11/24) Intake / Output; MIVF: UOP 0.8 ml/kg/hr (down slightly), NGT output 500 ml/24hrs (removing 11/30), LBM 11/27 per documentation  GI Imaging:  11/3 CT abd: SBO  11/4 Abd XR: small partial SBO 11/6 Abd XR: Persistent SBO  11/7 Abd XR: Persistent SBO  11/8 Abd XR: SBO with interval worsening 11/9 Abd XR: post-op free air, small bowel dilatation 11/21 CT - no abscess or obstruction, consistent with ileus 11/23 Abd XR: incr gaseous distention, may reflect developing obstruction or ileus  11/25 Abd XR: contrast in R colon  GI Surgeries / Procedures:  11/9 ex-lap with LOA   Central access: PICC placed 08/10/21 TPN start date:  08/11/21  Nutritional Goals: Goal continuous TPN rate is 90 mL/hr (provides 118 g of protein and 2334 kcals per day)  RD Estimated Needs Total Energy  Estimated Needs: 2300-2500 Total Protein Estimated Needs: 115-130 grams Total Fluid Estimated Needs: > 2 L  Current Nutrition:  Cyclic 12-hr TPN (back to full support 11/20 with NPO status) Advance to FLD 11/30, calorie count initiated Boost BID - all doses charted yesterday  Plan:   - Cycle TPN over 12 hours (rate 98-196 ml/hr, GIR 3.91-7.82 mg/kg/min) - provides 121g AA, 345g CHO, 65g ILE, and 2302 kCal, meeting 100% of needs - Electrolytes in TPN: Na 100 mEq/L, K 30 mEq/L, Ca 5 mEq/L, Mg 5 mEq/L, Phos 65mmol/L; Cl:Ac 1:1 - Add daily MVI and trace elements to TPN  - Monitor daily CBG check and adjust as needed - Monitor TPN labs Mon/Thurs and PRN - F/u PO intake/calorie, planning to continue TPN at goal until patient is able to consume 60% PO. Per Surgery note, anticipate either weaning or stopping TPN 12/1   14/1, PharmD, BCPS Please check AMION for all [facility] contact numbers Clinical Pharmacist 08/30/2021 9:29 AM

## 2021-08-30 NOTE — Progress Notes (Signed)
PROGRESS NOTE        PATIENT DETAILS Name: [name] Age: 70 y.o. Sex: male Date of Birth: 25-Feb-1951 Admit Date: 08/03/2021 Admitting Physician Darlin Drop, DO WUG:[id number], Janene Harvey, NP  Brief Narrative: Patient is a 70 y.o. male with history of HFrEF, EtOH use-who presented with SBO-subsequently underwent exploratory laparotomy on 11/9-further hospital course complicated by prolonged ileus.  See below for further details.  Subjective: Feels much better-NG tube removed earlier this morning.  Diet being advanced by general surgery.  Objective: Vitals: Blood pressure 117/72, pulse 66, temperature 98 F (36.7 C), temperature source Oral, resp. rate 17, height 6\' 1"  (1.854 m), weight 66.8 kg, SpO2 100 %.   Exam: Gen Exam:Alert awake-not in any distress HEENT:atraumatic, normocephalic Chest: B/L clear to auscultation anteriorly CVS:S1S2 regular Abdomen:soft non tender, non distended Extremities:no edema Neurology: Non focal Skin: no rash   Pertinent Labs/Radiology: Recent Labs  Lab 08/24/21 0346 08/25/21 0349 08/28/21 0237 08/29/21 0422 08/30/21 0559  NA 140   < > 134*   < > 135  K 3.7   < > 3.5   < > 4.0  CREATININE 0.56*   < > 0.53*   < > 0.47*  AST 22  --  17  --   --   ALT 60*  --  38  --   --   ALKPHOS 60  --  52  --   --   BILITOT 0.4  --  0.2*  --   --    < > = values in this interval not displayed.     Assessment/Plan: SBO-s/p exploratory laparotomy on 11/9-postop course complicated by prolonged ileus: General surgery following-NG tube discontinued today-diet being advanced-we will defer further to general surgery.    HFrEF (EF 20% on TTE in 2017): Volume status stable/compensated.  Does not require diuretics.  Influenza A: Completed course of Tamiflu-he appears asymptomatic.  HTN: BP stable-resume lisinopril when oral intake resumes.  HLD: Resume Crestor when oral intake resumes  CAD: No anginal symptoms-plan is to  resume aspirin when oral intake can be resumed.  Nutrition Status: Nutrition Problem: Severe Malnutrition Etiology: chronic illness (CHF) Signs/Symptoms: severe muscle depletion, severe fat depletion Interventions: TPN  BMI Estimated body mass index is 19.43 kg/m as calculated from the following:   Height as of this encounter: 6\' 1"  (1.854 m).   Weight as of this encounter: 66.8 kg.   Procedures: None Consults: CCS DVT Prophylaxis: Lovenox Code Status:Full code  Family Communication: None at bedside  Time spent: 15- minutes-Greater than 50% of this time was spent in counseling, explanation of diagnosis, planning of further management, and coordination of care.   Disposition Plan: Status is: Inpatient  Remains inpatient appropriate because: SBO-s/p laparotomy with prolonged ileus-on TPN-NG tube still in place.   Diet: Diet Order             Diet full liquid Room service appropriate? Yes; Fluid consistency: Thin  Diet effective now                     Antimicrobial agents: Anti-infectives (From admission, onward)    Start     Dose/Rate Route Frequency Ordered Stop   08/09/21 1000  ceFAZolin (ANCEF) IVPB 2g/100 mL premix  Status:  Discontinued        2 g 200 mL/hr over 30  Minutes Intravenous To Short Stay 08/09/21 0758 08/10/21 1000   08/04/21 2200  oseltamivir (TAMIFLU) capsule 30 mg        30 mg Oral 2 times daily 08/04/21 0822 08/09/21 0959   08/04/21 1000  oseltamivir (TAMIFLU) capsule 75 mg        75 mg Oral  Once 08/04/21 0825 08/04/21 1116   08/03/21 1645  cefTRIAXone (ROCEPHIN) 2 g in sodium chloride 0.9 % 100 mL IVPB  Status:  Discontinued        2 g 200 mL/hr over 30 Minutes Intravenous Every 24 hours 08/03/21 1631 08/06/21 1519   08/03/21 1645  metroNIDAZOLE (FLAGYL) IVPB 500 mg  Status:  Discontinued        500 mg 100 mL/hr over 60 Minutes Intravenous Every 12 hours 08/03/21 1631 08/06/21 1519        MEDICATIONS: Scheduled Meds:  bisacodyl   10 mg Rectal Daily   Chlorhexidine Gluconate Cloth  6 each Topical Daily   enoxaparin (LOVENOX) injection  40 mg Subcutaneous Q24H   feeding supplement  237 mL Oral BID BM   lip balm  1 application Topical BID   mouth rinse  15 mL Mouth Rinse BID   polyethylene glycol  17 g Oral Daily   sodium chloride flush  10-40 mL Intracatheter Q12H   sodium chloride flush  3 mL Intravenous Q12H   Continuous Infusions:  sodium chloride     methocarbamol (ROBAXIN) IV     PRN Meds:.sodium chloride, acetaminophen, albuterol, alum & mag hydroxide-simeth, bisacodyl, enalaprilat, hydrALAZINE, magic mouthwash, menthol-cetylpyridinium, methocarbamol (ROBAXIN) IV **OR** methocarbamol, metoCLOPramide (REGLAN) injection, metoprolol tartrate, morphine injection, ondansetron **OR** ondansetron (ZOFRAN) IV, phenol, prochlorperazine, simethicone, sodium chloride flush, sodium chloride flush   I have personally reviewed following labs and imaging studies  LABORATORY DATA: CBC: No results for input(s): WBC, NEUTROABS, HGB, HCT, MCV, PLT in the last 168 hours.  Basic Metabolic Panel: Recent Labs  Lab 08/24/21 0346 08/25/21 0349 08/28/21 0237 08/29/21 0422 08/30/21 0559  NA 140 142 134* 138 135  K 3.7 4.0 3.5 4.0 4.0  CL 106 108 104 105 103  CO2 29 25 23 26 27   GLUCOSE 171* 150* 150* 128* 129*  BUN 26* 29* 25* 24* 22  CREATININE 0.56* 0.59* 0.53* 0.53* 0.47*  CALCIUM 8.8* 8.7* 8.1* 8.6* 8.5*  MG 2.1  --  1.8  --   --   PHOS 3.6  --  3.3  --   --      GFR: Estimated Creatinine Clearance: 82.3 mL/min (A) (by C-G formula based on SCr of 0.47 mg/dL (L)).  Liver Function Tests: Recent Labs  Lab 08/24/21 0346 08/28/21 0237  AST 22 17  ALT 60* 38  ALKPHOS 60 52  BILITOT 0.4 0.2*  PROT 6.0* 5.8*  ALBUMIN 2.8* 2.6*    No results for input(s): LIPASE, AMYLASE in the last 168 hours. No results for input(s): AMMONIA in the last 168 hours.  Coagulation Profile: No results for input(s): INR,  PROTIME in the last 168 hours.  Cardiac Enzymes: No results for input(s): CKTOTAL, CKMB, CKMBINDEX, TROPONINI in the last 168 hours.  BNP (last 3 results) No results for input(s): PROBNP in the last 8760 hours.  Lipid Profile: Recent Labs    08/28/21 0237  TRIG 49     Thyroid Function Tests: No results for input(s): TSH, T4TOTAL, FREET4, T3FREE, THYROIDAB in the last 72 hours.  Anemia Panel: No results for input(s): VITAMINB12, FOLATE, FERRITIN, TIBC, IRON, RETICCTPCT  in the last 72 hours.  Urine analysis:    Component Value Date/Time   COLORURINE YELLOW 12/06/2007 2258   APPEARANCEUR Cloudy (A) 10/28/2015 1615   LABSPEC 1.020 05/08/2011 1016   PHURINE 5.5 05/08/2011 1016   GLUCOSEU Negative 10/28/2015 1615   HGBUR SMALL (A) 05/08/2011 1016   BILIRUBINUR Negative 10/28/2015 1615   KETONESUR NEGATIVE 05/08/2011 1016   PROTEINUR Trace 10/28/2015 1615   PROTEINUR NEGATIVE 05/08/2011 1016   UROBILINOGEN 0.2 05/08/2011 1016   NITRITE Positive (A) 10/28/2015 1615   NITRITE NEGATIVE 05/08/2011 1016   LEUKOCYTESUR Negative 10/28/2015 1615    Sepsis Labs: Lactic Acid, Venous No results found for: LATICACIDVEN  MICROBIOLOGY: No results found for this or any previous visit (from the past 240 hour(s)).  RADIOLOGY STUDIES/RESULTS: No results found.   LOS: 26 days   Jeoffrey Massed, MD  [facility]    To contact the attending provider between 7A-7P or the covering provider during after hours 7P-7A, please log into the web site [email or web address] and access using universal [place] password for that web site. If you do not have the password, please call the hospital operator.  08/30/2021, 9:24 AM

## 2021-08-30 NOTE — Progress Notes (Signed)
Mobility Specialist Progress Note:   08/30/21 1225  Mobility  Activity Ambulated to bathroom  Level of Assistance Standby assist, set-up cues, supervision of patient - no hands on  Assistive Device None  Distance Ambulated (ft) 20 ft  Mobility Out of bed for toileting  Mobility Response Tolerated well  Mobility performed by Mobility specialist  $Mobility charge 1 Mobility   Pt asx during ambulation. BM successful. Back in bed with all needs met.   Nelta Numbers Mobility Specialist  Phone [phone]

## 2021-08-31 DIAGNOSIS — K56609 Unspecified intestinal obstruction, unspecified as to partial versus complete obstruction: Secondary | ICD-10-CM | POA: Diagnosis not present

## 2021-08-31 LAB — COMPREHENSIVE METABOLIC PANEL
ALT: 57 U/L — ABNORMAL HIGH (ref 0–44)
AST: 32 U/L (ref 15–41)
Albumin: 2.7 g/dL — ABNORMAL LOW (ref 3.5–5.0)
Alkaline Phosphatase: 51 U/L (ref 38–126)
Anion gap: 6 (ref 5–15)
BUN: 25 mg/dL — ABNORMAL HIGH (ref 8–23)
CO2: 26 mmol/L (ref 22–32)
Calcium: 8.6 mg/dL — ABNORMAL LOW (ref 8.9–10.3)
Chloride: 105 mmol/L (ref 98–111)
Creatinine, Ser: 0.55 mg/dL — ABNORMAL LOW (ref 0.61–1.24)
GFR, Estimated: >60 mL/min (ref >60)
Glucose, Bld: 131 mg/dL — ABNORMAL HIGH (ref 70–99)
Potassium: 4.1 mmol/L (ref 3.5–5.1)
Sodium: 137 mmol/L (ref 135–145)
Total Bilirubin: 0.4 mg/dL (ref 0.3–1.2)
Total Protein: 6 g/dL — ABNORMAL LOW (ref 6.5–8.1)

## 2021-08-31 LAB — MAGNESIUM: Magnesium: 1.9 mg/dL (ref 1.7–2.4)

## 2021-08-31 LAB — GLUCOSE, CAPILLARY
Glucose-Capillary: 114 mg/dL — ABNORMAL HIGH (ref 70–99)
Glucose-Capillary: 69 mg/dL — ABNORMAL LOW (ref 70–99)

## 2021-08-31 LAB — PREALBUMIN: Prealbumin: 19.8 mg/dL (ref 18–38)

## 2021-08-31 LAB — PHOSPHORUS: Phosphorus: 3.5 mg/dL (ref 2.5–4.6)

## 2021-08-31 MED ORDER — TRAVASOL 10 % IV SOLN
INTRAVENOUS | Status: AC
  Filled 2021-08-31: qty 603.7

## 2021-08-31 NOTE — Progress Notes (Signed)
Mobility Specialist Progress Note:   08/31/21 1020  Mobility  Activity Ambulated in hall  Level of Assistance Modified independent, requires aide device or extra time  Assistive Device Front wheel walker  Distance Ambulated (ft) 500 ft  Mobility Ambulated independently in hallway  Mobility Response Tolerated well  Mobility performed by Mobility specialist  $Mobility charge 1 Mobility   Pt asx during ambulation. Distance limited d/t urine frequency. Pt back in bed with all needs met.  Nelta Numbers Mobility Specialist  Phone [phone]

## 2021-08-31 NOTE — Progress Notes (Signed)
Calorie Count Note - Day 1  48 - hour calorie count ordered.  Diet: Soft Supplements: Ensure Enlive - QID  11/30 Lunch: 218 kcal, 3 gm PRO 11/30 Dinner: No Documentation 12/01 Breakfast: 145 kcal, 8 gm PRO Supplements: 175 kcal, 10 gm PRO  Total intake: 538 kcal (23% of minimum estimated needs)  21 gm protein (18% of minimum estimated needs)  Nutrition Diagnosis: Severe Malnutrition related to chronic illness (CHF) as evidenced by severe muscle depletion, severe fat depletion.  Goal: Patient will meet greater than or equal to 90% of their needs.  Intervention:  - Ensure Enlive - QID - TPN managed per pharmacy  Kirby Crigler BS, PLDN Clinical Dietitian See [facility] for contact information.

## 2021-08-31 NOTE — Progress Notes (Signed)
PROGRESS NOTE        PATIENT DETAILS Name: [name] Age: 70 y.o. Sex: male Date of Birth: 1951-05-12 Admit Date: 08/03/2021 Admitting Physician Darlin Drop, DO WGN:[id number], Janene Harvey, NP  Brief Narrative: Patient is a 70 y.o. male with history of HFrEF, EtOH use-who presented with SBO-subsequently underwent exploratory laparotomy on 11/9-further hospital course complicated by prolonged ileus.  See below for further details.  Subjective:  BM overnight-tolerating advancement in diet.  Objective: Vitals: Blood pressure 116/66, pulse 73, temperature 97.9 F (36.6 C), resp. rate 19, height 6\' 1"  (1.854 m), weight 66.8 kg, SpO2 100 %.   Exam: Gen Exam:Alert awake-not in any distress HEENT:atraumatic, normocephalic Chest: B/L clear to auscultation anteriorly CVS:S1S2 regular Abdomen:soft non tender, non distended Extremities:no edema Neurology: Non focal Skin: no rash   Pertinent Labs/Radiology: Recent Labs  Lab 08/28/21 0237 08/29/21 0422 08/31/21 0453  NA 134*   < > 137  K 3.5   < > 4.1  CREATININE 0.53*   < > 0.55*  AST 17  --  32  ALT 38  --  57*  ALKPHOS 52  --  51  BILITOT 0.2*  --  0.4   < > = values in this interval not displayed.     Assessment/Plan: SBO-s/p exploratory laparotomy on 11/9-postop course complicated by prolonged ileus: General surgery following-NG tube DC'd on 11/30-diet being advanced.  Slowly taper down TNA-continue to mobilize.   HFrEF (EF 20% on TTE in 2017): Volume status stable/compensated.  Does not require diuretics.  Influenza A: Completed course of Tamiflu-he appears asymptomatic.  HTN: BP stable-resume lisinopril when oral intake resumes.  HLD: Resume Crestor when oral intake resumes  CAD: No anginal symptoms-plan is to resume aspirin when oral intake can be resumed.  Nutrition Status: Nutrition Problem: Severe Malnutrition Etiology: chronic illness (CHF) Signs/Symptoms: severe muscle  depletion, severe fat depletion Interventions: TPN  BMI Estimated body mass index is 19.43 kg/m as calculated from the following:   Height as of this encounter: 6\' 1"  (1.854 m).   Weight as of this encounter: 66.8 kg.   Procedures: None Consults: CCS DVT Prophylaxis: Lovenox Code Status:Full code  Family Communication: None at bedside  Time spent: 15- minutes-Greater than 50% of this time was spent in counseling, explanation of diagnosis, planning of further management, and coordination of care.   Disposition Plan: Status is: Inpatient  Remains inpatient appropriate because: SBO-s/p laparotomy with prolonged ileus-on TPN-NG tube still in place.   Diet: Diet Order             DIET SOFT Room service appropriate? Yes with Assist; Fluid consistency: Thin  Diet effective now                     Antimicrobial agents: Anti-infectives (From admission, onward)    Start     Dose/Rate Route Frequency Ordered Stop   08/09/21 1000  ceFAZolin (ANCEF) IVPB 2g/100 mL premix  Status:  Discontinued        2 g 200 mL/hr over 30 Minutes Intravenous To Short Stay 08/09/21 0758 08/10/21 1000   08/04/21 2200  oseltamivir (TAMIFLU) capsule 30 mg        30 mg Oral 2 times daily 08/04/21 0822 08/09/21 0959   08/04/21 1000  oseltamivir (TAMIFLU) capsule 75 mg  75 mg Oral  Once 08/04/21 0825 08/04/21 1116   08/03/21 1645  cefTRIAXone (ROCEPHIN) 2 g in sodium chloride 0.9 % 100 mL IVPB  Status:  Discontinued        2 g 200 mL/hr over 30 Minutes Intravenous Every 24 hours 08/03/21 1631 08/06/21 1519   08/03/21 1645  metroNIDAZOLE (FLAGYL) IVPB 500 mg  Status:  Discontinued        500 mg 100 mL/hr over 60 Minutes Intravenous Every 12 hours 08/03/21 1631 08/06/21 1519        MEDICATIONS: Scheduled Meds:  bisacodyl  10 mg Rectal Daily   Chlorhexidine Gluconate Cloth  6 each Topical Daily   enoxaparin (LOVENOX) injection  40 mg Subcutaneous Q24H   feeding supplement  237 mL Oral  TID WC & HS   lip balm  1 application Topical BID   mouth rinse  15 mL Mouth Rinse BID   polyethylene glycol  17 g Oral Daily   sodium chloride flush  10-40 mL Intracatheter Q12H   sodium chloride flush  3 mL Intravenous Q12H   Continuous Infusions:  sodium chloride     methocarbamol (ROBAXIN) IV     TPN CYCLIC-ADULT (ION)     PRN Meds:.sodium chloride, acetaminophen, albuterol, alum & mag hydroxide-simeth, bisacodyl, enalaprilat, hydrALAZINE, magic mouthwash, menthol-cetylpyridinium, methocarbamol (ROBAXIN) IV **OR** methocarbamol, metoCLOPramide (REGLAN) injection, metoprolol tartrate, morphine injection, ondansetron **OR** ondansetron (ZOFRAN) IV, phenol, prochlorperazine, simethicone, sodium chloride flush, sodium chloride flush   I have personally reviewed following labs and imaging studies  LABORATORY DATA: CBC: No results for input(s): WBC, NEUTROABS, HGB, HCT, MCV, PLT in the last 168 hours.  Basic Metabolic Panel: Recent Labs  Lab 08/25/21 0349 08/28/21 0237 08/29/21 0422 08/30/21 0559 08/31/21 0453  NA 142 134* 138 135 137  K 4.0 3.5 4.0 4.0 4.1  CL 108 104 105 103 105  CO2 25 23 26 27 26   GLUCOSE 150* 150* 128* 129* 131*  BUN 29* 25* 24* 22 25*  CREATININE 0.59* 0.53* 0.53* 0.47* 0.55*  CALCIUM 8.7* 8.1* 8.6* 8.5* 8.6*  MG  --  1.8  --   --  1.9  PHOS  --  3.3  --   --  3.5     GFR: Estimated Creatinine Clearance: 82.3 mL/min (A) (by C-G formula based on SCr of 0.55 mg/dL (L)).  Liver Function Tests: Recent Labs  Lab 08/28/21 0237 08/31/21 0453  AST 17 32  ALT 38 57*  ALKPHOS 52 51  BILITOT 0.2* 0.4  PROT 5.8* 6.0*  ALBUMIN 2.6* 2.7*    No results for input(s): LIPASE, AMYLASE in the last 168 hours. No results for input(s): AMMONIA in the last 168 hours.  Coagulation Profile: No results for input(s): INR, PROTIME in the last 168 hours.  Cardiac Enzymes: No results for input(s): CKTOTAL, CKMB, CKMBINDEX, TROPONINI in the last 168  hours.  BNP (last 3 results) No results for input(s): PROBNP in the last 8760 hours.  Lipid Profile: No results for input(s): CHOL, HDL, LDLCALC, TRIG, CHOLHDL, LDLDIRECT in the last 72 hours.   Thyroid Function Tests: No results for input(s): TSH, T4TOTAL, FREET4, T3FREE, THYROIDAB in the last 72 hours.  Anemia Panel: No results for input(s): VITAMINB12, FOLATE, FERRITIN, TIBC, IRON, RETICCTPCT in the last 72 hours.  Urine analysis:    Component Value Date/Time   COLORURINE YELLOW 12/06/2007 2258   APPEARANCEUR Cloudy (A) 10/28/2015 1615   LABSPEC 1.020 05/08/2011 1016   PHURINE 5.5 05/08/2011 1016  GLUCOSEU Negative 10/28/2015 1615   HGBUR SMALL (A) 05/08/2011 1016   BILIRUBINUR Negative 10/28/2015 1615   KETONESUR NEGATIVE 05/08/2011 1016   PROTEINUR Trace 10/28/2015 1615   PROTEINUR NEGATIVE 05/08/2011 1016   UROBILINOGEN 0.2 05/08/2011 1016   NITRITE Positive (A) 10/28/2015 1615   NITRITE NEGATIVE 05/08/2011 1016   LEUKOCYTESUR Negative 10/28/2015 1615    Sepsis Labs: Lactic Acid, Venous No results found for: LATICACIDVEN  MICROBIOLOGY: No results found for this or any previous visit (from the past 240 hour(s)).  RADIOLOGY STUDIES/RESULTS: No results found.   LOS: 27 days   Jeoffrey Massed, MD  [facility]    To contact the attending provider between 7A-7P or the covering provider during after hours 7P-7A, please log into the web site [email or web address] and access using universal [place] password for that web site. If you do not have the password, please call the hospital operator.  08/31/2021, 1:43 PM

## 2021-08-31 NOTE — Progress Notes (Signed)
Mobility Specialist Progress Note:   08/31/21 1128  Mobility  Activity Ambulated to bathroom  Level of Assistance Modified independent, requires aide device or extra time  Assistive Device Front wheel walker  Distance Ambulated (ft) 15 ft  Mobility Out of bed for toileting  Mobility Response Tolerated well  Mobility performed by Mobility specialist  $Mobility charge 1 Mobility   Pt received in BR, ready to get back in bed. BM successful. Pt complaining of abdominal distension. Pt back in bed with all needs met.   Nelta Numbers Mobility Specialist  Phone [phone]
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
## 2021-08-31 NOTE — Progress Notes (Signed)
Mobility Specialist Progress Note:   08/31/21 0950  Mobility  Activity Ambulated to bathroom  Level of Assistance Standby assist, set-up cues, supervision of patient - no hands on  Assistive Device Front wheel walker  Distance Ambulated (ft) 25 ft  Mobility Out of bed for toileting  Mobility Response Tolerated well  Mobility performed by Mobility specialist  $Mobility charge 1 Mobility   Pt requesting to go to BR. BM successful.   Addison Lank Mobility Specialist  Phone [phone]

## 2021-08-31 NOTE — Progress Notes (Addendum)
PHARMACY - TOTAL PARENTERAL NUTRITION CONSULT NOTE  Indication: Small bowel obstruction, prolonged ileus  Patient Measurements: Height: 6\' 1"  (185.4 cm) Weight: 66.8 kg (147 lb 4.3 oz) IBW/kg (Calculated) : 79.9 TPN AdjBW (KG): 70 Body mass index is 19.43 kg/m. Usual Weight: 74 kg  Assessment:  70 y.o. male with PMH significant for HTN, CHF with LVEF ~25%, GERD, BPH, prior history of alcoholism who presented with complaints of lower abdominal pain, found to have SBO. Patient has remained NPO/CLD, now for more than 7 days and at risk of becoming severely malnourished. Pharmacy is consulted to manage TPN.   Per RD's conversation with patient, reported he was eating well PTA. States he would have abdominal pain when eating. Per patient 11/15, he sometimes skips meals at home even though his wife is a good cook because he just doesn't feel like eating.  Glucose / Insulin: no hx DM, A1c 6.1% - CBGs controlled on low end, not on insulin Electrolytes: Mag 1.9 (goal >/=2 with ileus), others stable WNL Renal: SCr < 1 stable, BUN up to 25 Hepatic: ALT mildly elevated, AST / TG / tbili WNL, albumin 2.7, prealbumin WNL Intake / Output; MIVF: UOP 0.6 ml/kg/hr, NGT removed, LBM 12/1 overnight  GI Imaging:  11/3 CT abd: SBO  11/4 Abd XR: small partial SBO 11/6 Abd XR: Persistent SBO  11/7 Abd XR: Persistent SBO  11/8 Abd XR: SBO with interval worsening 11/9 Abd XR: post-op free air, SB dilatation 11/21 CT - no abscess/obstruction, consistent with ileus 11/23 Abd XR: incr gaseous distention, may reflect developing obstruction or ileus  11/25 Abd XR: contrast in R colon  GI Surgeries / Procedures:  11/9 ex-lap with LOA   Central access: PICC placed 08/10/21 TPN start date:  08/11/21  Nutritional Goals: Goal continuous TPN rate is 90 mL/hr (provides 118 g of protein and 2334 kcals per day)  RD Estimated Needs Total Energy Estimated Needs: 2300-2500 Total Protein Estimated Needs: 115-130  grams Total Fluid Estimated Needs: > 2 L  Current Nutrition:  Cyclic 12-hr TPN Advance to soft diet 12/1, 48-hr calorie count initiated on 11/30 - eating 50-100% of meals so far with fair appetite per discussion with RN/patient Ensure BID - all doses charted yesterday  Plan:   - Wean TPN to 1/2 rate today at 1800 per discussion with Dr. 12/30 of Surgery and hopefully d/c TPN tomorrow - Cycle TPN over 12 hours at 1/2 goal rate (rate 49-98 ml/hr, GIR 1.96-3.91 mg/kg/min) - Electrolytes in TPN: Na 100 mEq/L, K 30 mEq/L, Ca 5 mEq/L, increase Mg to 8 mEq/L, Phos 79mmol/L; Cl:Ac 1:1 - Add daily MVI and trace elements to TPN  - Monitor daily CBG check and adjust as needed - Monitor TPN labs Mon/Thurs and PRN - F/u PO intake/calorie count - hopeful to d/c TPN 12/2 per Surgery   14/2, PharmD, BCPS Please check AMION for all [facility] contact numbers Clinical Pharmacist 08/31/2021 8:08 AM

## 2021-09-01 ENCOUNTER — Inpatient Hospital Stay (HOSPITAL_COMMUNITY): Payer: Medicare HMO

## 2021-09-01 DIAGNOSIS — K56609 Unspecified intestinal obstruction, unspecified as to partial versus complete obstruction: Secondary | ICD-10-CM | POA: Diagnosis not present

## 2021-09-01 LAB — GLUCOSE, CAPILLARY
Glucose-Capillary: 101 mg/dL — ABNORMAL HIGH (ref 70–99)
Glucose-Capillary: 146 mg/dL — ABNORMAL HIGH (ref 70–99)
Glucose-Capillary: 154 mg/dL — ABNORMAL HIGH (ref 70–99)
Glucose-Capillary: 88 mg/dL (ref 70–99)

## 2021-09-01 MED ORDER — BOOST / RESOURCE BREEZE PO LIQD CUSTOM
1.0000 | Freq: Three times a day (TID) | ORAL | Status: DC
  Administered 2021-09-04 – 2021-09-08 (×11): 1 via ORAL

## 2021-09-01 MED ORDER — TRAVASOL 10 % IV SOLN
INTRAVENOUS | Status: AC
  Filled 2021-09-01: qty 1185.8

## 2021-09-01 NOTE — Progress Notes (Addendum)
Initial Nutrition Assessment  DOCUMENTATION CODES:   Severe malnutrition in context of chronic illness  INTERVENTION:   Advance diet as medically appropriate Encourage good PO intake  Boost Breeze po TID, each supplement provides 250 kcal and 9 grams of protein Continue TPN per Pharmacy Recommend continuing TPN at goal until pt demonstrates ability to eat 60% of needs via PO. Discontinue calorie count  NUTRITION DIAGNOSIS:   Severe Malnutrition related to chronic illness (CHF) as evidenced by severe muscle depletion, severe fat depletion. - Ongoing  GOAL:   Patient will meet greater than or equal to 90% of their needs - Met via TPN  MONITOR:   PO intake, Supplement acceptance, Diet advancement, Weight trends  REASON FOR ASSESSMENT:   NPO/Clear Liquid Diet    ASSESSMENT:   70 y.o. male presented to the ED with lower abdominal pain. PMH includes CHF, GERD, and HTN. Pt admitted with enteritis and an ileus; Flu+ on admission.   11/03: NPO; CLD 11/04: NPO 11/06: NG tube placed for LIS 11/09 - s/p ex-lap with lysis of adhesions 11/11 - TPN start 11/12 - TPN increased to goal rate 11/13 - NG tube clamped, later removed 11/14 - clear liquids, full liquids 11/17 - full liquids, SOFT, full liquids 11/18 - SOFT, full liquids, SOFT 11/19 - Dysphagia 3; NGT to LIWS 11/20 - NPO 11/25 - NGT clamping trial 11/26 - diet advanced to CLD 11/28 - NGT unclamped and on LIWS due to distention 11/30 - diet advanced to FLD; calorie count started 12/01 - diet advanced to Soft 12/02 - NPO; diet advanced to clear liquids  Pt was able to consume 23% of his energy needs on Day 1 of calorie count; Day 2 did not have any documentation.   Pt with multiple episodes of vomiting yesterday and was made NPO, slow advancement to clear liquids,  Pt reports that Ensure makes his abdomen hard and stomach hurt. Pt states that Boost Gwyneth Revels is a lot better. Discussed with pt to sip on Boost through out  the day and to prioritize food first, but take in as much as tolerated.   Discussed Cortrak placement with pt, pt does not want another tube due to last one hurting.   Per pharmacy note, TPN is at goal rate.   Medications reviewed and include: Dulcolax, Robaxin Labs reviewed.  Diet Order:   Diet Order             Diet clear liquid Room service appropriate? Yes; Fluid consistency: Thin  Diet effective now                   EDUCATION NEEDS:   No education needs have been identified at this time  Skin:  Skin Assessment: Skin Integrity Issues: Skin Integrity Issues:: Incisions Incisions: Abdomen  Last BM:  08/31/2021  Height:   Ht Readings from Last 1 Encounters:  08/11/21 '6\' 1"'  (1.854 m)    Weight:   Wt Readings from Last 1 Encounters:  09/01/21 68.2 kg    Ideal Body Weight:  83.6 kg  BMI:  Body mass index is 19.84 kg/m.  Estimated Nutritional Needs:   Kcal:  2300-2500  Protein:  115-130 grams  Fluid:  > 2 L    Seniah Lawrence BS, PLDN Clinical Dietitian See AMiON for contact information.

## 2021-09-01 NOTE — Progress Notes (Signed)
Mobility Specialist Progress Note:   09/01/21 1230  Mobility  Activity Ambulated in hall  Level of Assistance Modified independent, requires aide device or extra time  Assistive Device Front wheel walker  Distance Ambulated (ft) 550 ft  Mobility Ambulated independently in hallway  Mobility Response Tolerated well  Mobility performed by Mobility specialist  $Mobility charge 1 Mobility   Second mobility session today. Pt asx during ambulation.   Addison Lank Mobility Specialist  Phone [phone]

## 2021-09-01 NOTE — Progress Notes (Signed)
PROGRESS NOTE        PATIENT DETAILS Name: [name] Age: 70 y.o. Sex: male Date of Birth: 1951-02-11 Admit Date: 08/03/2021 Admitting Physician Darlin Drop, DO OVF:IEPPIRJ, Janene Harvey, NP  Brief Narrative: Patient is a 70 y.o. male with history of HFrEF, EtOH use-who presented with SBO-subsequently underwent exploratory laparotomy on 11/9-further hospital course complicated by prolonged ileus.  See below for further details.  Subjective:  Vomited x2 last night-watery stools.  Objective: Vitals: Blood pressure 110/71, pulse 84, temperature 98 F (36.7 C), resp. rate 17, height 6\' 1"  (1.854 m), weight 66.3 kg, SpO2 100 %.   Exam: Gen Exam:Alert awake-not in any distress HEENT:atraumatic, normocephalic Chest: B/L clear to auscultation anteriorly CVS:S1S2 regular Abdomen:soft non tender, non distended Extremities:no edema Neurology: Non focal Skin: no rash   Pertinent Labs/Radiology: Recent Labs  Lab 08/28/21 0237 08/29/21 0422 08/31/21 0453  NA 134*   < > 137  K 3.5   < > 4.1  CREATININE 0.53*   < > 0.55*  AST 17  --  32  ALT 38  --  57*  ALKPHOS 52  --  51  BILITOT 0.2*  --  0.4   < > = values in this interval not displayed.     Assessment/Plan: SBO-s/p exploratory laparotomy on 11/9-postop course complicated by prolonged ileus: General surgery following-NG tube DC'd on 11/30-diet was being advanced-unfortunately he vomited overnight-General surgery has switched him back to n.p.o. status-awaiting abdominal x-rays.  Remains on TNA.  Continue to mobilize as much as possible.  HFrEF (EF 20% on TTE in 2017): Volume status stable/compensated.  Does not require diuretics.  Influenza A: Completed course of Tamiflu-he appears asymptomatic.  HTN: BP stable-resume lisinopril when oral intake resumes.  HLD: Resume Crestor when oral intake resumes  CAD: No anginal symptoms-plan is to resume aspirin when oral intake can be  resumed.  Nutrition Status: Nutrition Problem: Severe Malnutrition Etiology: chronic illness (CHF) Signs/Symptoms: severe muscle depletion, severe fat depletion Interventions: TPN  BMI Estimated body mass index is 19.28 kg/m as calculated from the following:   Height as of this encounter: 6\' 1"  (1.854 m).   Weight as of this encounter: 66.3 kg.   Procedures: None Consults: CCS DVT Prophylaxis: Lovenox Code Status:Full code  Family Communication: None at bedside  Time spent: 15- minutes-Greater than 50% of this time was spent in counseling, explanation of diagnosis, planning of further management, and coordination of care.   Disposition Plan: Status is: Inpatient  Remains inpatient appropriate because: SBO-s/p laparotomy with prolonged ileus-on TPN-NG tube still in place.   Diet: Diet Order             Diet clear liquid Room service appropriate? Yes; Fluid consistency: Thin  Diet effective now                     Antimicrobial agents: Anti-infectives (From admission, onward)    Start     Dose/Rate Route Frequency Ordered Stop   08/09/21 1000  ceFAZolin (ANCEF) IVPB 2g/100 mL premix  Status:  Discontinued        2 g 200 mL/hr over 30 Minutes Intravenous To Short Stay 08/09/21 0758 08/10/21 1000   08/04/21 2200  oseltamivir (TAMIFLU) capsule 30 mg        30 mg Oral 2 times daily 08/04/21 0822 08/09/21 0959  08/04/21 1000  oseltamivir (TAMIFLU) capsule 75 mg        75 mg Oral  Once 08/04/21 0825 08/04/21 1116   08/03/21 1645  cefTRIAXone (ROCEPHIN) 2 g in sodium chloride 0.9 % 100 mL IVPB  Status:  Discontinued        2 g 200 mL/hr over 30 Minutes Intravenous Every 24 hours 08/03/21 1631 08/06/21 1519   08/03/21 1645  metroNIDAZOLE (FLAGYL) IVPB 500 mg  Status:  Discontinued        500 mg 100 mL/hr over 60 Minutes Intravenous Every 12 hours 08/03/21 1631 08/06/21 1519        MEDICATIONS: Scheduled Meds:  bisacodyl  10 mg Rectal Daily   Chlorhexidine  Gluconate Cloth  6 each Topical Daily   enoxaparin (LOVENOX) injection  40 mg Subcutaneous Q24H   lip balm  1 application Topical BID   mouth rinse  15 mL Mouth Rinse BID   sodium chloride flush  10-40 mL Intracatheter Q12H   sodium chloride flush  3 mL Intravenous Q12H   Continuous Infusions:  sodium chloride     methocarbamol (ROBAXIN) IV     TPN CYCLIC-ADULT (ION)     PRN Meds:.sodium chloride, acetaminophen, albuterol, alum & mag hydroxide-simeth, bisacodyl, enalaprilat, hydrALAZINE, magic mouthwash, menthol-cetylpyridinium, methocarbamol (ROBAXIN) IV **OR** methocarbamol, metoCLOPramide (REGLAN) injection, metoprolol tartrate, morphine injection, ondansetron **OR** ondansetron (ZOFRAN) IV, phenol, prochlorperazine, simethicone, sodium chloride flush, sodium chloride flush   I have personally reviewed following labs and imaging studies  LABORATORY DATA: CBC: No results for input(s): WBC, NEUTROABS, HGB, HCT, MCV, PLT in the last 168 hours.  Basic Metabolic Panel: Recent Labs  Lab 08/28/21 0237 08/29/21 0422 08/30/21 0559 08/31/21 0453  NA 134* 138 135 137  K 3.5 4.0 4.0 4.1  CL 104 105 103 105  CO2 23 26 27 26   GLUCOSE 150* 128* 129* 131*  BUN 25* 24* 22 25*  CREATININE 0.53* 0.53* 0.47* 0.55*  CALCIUM 8.1* 8.6* 8.5* 8.6*  MG 1.8  --   --  1.9  PHOS 3.3  --   --  3.5     GFR: Estimated Creatinine Clearance: 81.7 mL/min (A) (by C-G formula based on SCr of 0.55 mg/dL (L)).  Liver Function Tests: Recent Labs  Lab 08/28/21 0237 08/31/21 0453  AST 17 32  ALT 38 57*  ALKPHOS 52 51  BILITOT 0.2* 0.4  PROT 5.8* 6.0*  ALBUMIN 2.6* 2.7*    No results for input(s): LIPASE, AMYLASE in the last 168 hours. No results for input(s): AMMONIA in the last 168 hours.  Coagulation Profile: No results for input(s): INR, PROTIME in the last 168 hours.  Cardiac Enzymes: No results for input(s): CKTOTAL, CKMB, CKMBINDEX, TROPONINI in the last 168 hours.  BNP (last 3  results) No results for input(s): PROBNP in the last 8760 hours.  Lipid Profile: No results for input(s): CHOL, HDL, LDLCALC, TRIG, CHOLHDL, LDLDIRECT in the last 72 hours.   Thyroid Function Tests: No results for input(s): TSH, T4TOTAL, FREET4, T3FREE, THYROIDAB in the last 72 hours.  Anemia Panel: No results for input(s): VITAMINB12, FOLATE, FERRITIN, TIBC, IRON, RETICCTPCT in the last 72 hours.  Urine analysis:    Component Value Date/Time   COLORURINE YELLOW 12/06/2007 2258   APPEARANCEUR Cloudy (A) 10/28/2015 1615   LABSPEC 1.020 05/08/2011 1016   PHURINE 5.5 05/08/2011 1016   GLUCOSEU Negative 10/28/2015 1615   HGBUR SMALL (A) 05/08/2011 1016   BILIRUBINUR Negative 10/28/2015 1615   KETONESUR NEGATIVE 05/08/2011  1016   PROTEINUR Trace 10/28/2015 1615   PROTEINUR NEGATIVE 05/08/2011 1016   UROBILINOGEN 0.2 05/08/2011 1016   NITRITE Positive (A) 10/28/2015 1615   NITRITE NEGATIVE 05/08/2011 1016   LEUKOCYTESUR Negative 10/28/2015 1615    Sepsis Labs: Lactic Acid, Venous No results found for: LATICACIDVEN  MICROBIOLOGY: No results found for this or any previous visit (from the past 240 hour(s)).  RADIOLOGY STUDIES/RESULTS: No results found.   LOS: 28 days   Jeoffrey Massed, MD  [facility]    To contact the attending provider between 7A-7P or the covering provider during after hours 7P-7A, please log into the web site [email or web address] and access using universal [place] password for that web site. If you do not have the password, please call the hospital operator.  09/01/2021, 10:23 AM

## 2021-09-01 NOTE — Progress Notes (Signed)
Patient ID: [name], male   DOB: 18-Oct-1950, 70 y.o.   MRN: [id number]   Acute Care Surgery Service Progress Note:    Chief Complaint/Subjective: Cc  Denies pain. Having minimal flatus, more belching. Vomited twice yesterday -- 200 cc in the afternoon, another 300 cc at shift change. Also had small volume, watery stools yesterday.   Objective: Vital signs in last 24 hours: Temp:  [97.3 F (36.3 C)-98.3 F (36.8 C)] 98 F (36.7 C) (12/02 0532) Pulse Rate:  [73-93] 84 (12/02 0532) Resp:  [17-19] 17 (12/02 0532) BP: (110-130)/(66-92) 110/71 (12/02 0532) SpO2:  [100 %] 100 % (12/02 0532) Weight:  [66.3 kg] 66.3 kg (12/02 0501) Last BM Date: 08/31/21  Intake/Output from previous day: 12/01 0701 - 12/02 0700 In: 1629.9 [P.O.:560; I.V.:1069.9] Out: 5450 [Urine:5250; Emesis/NG output:200] Intake/Output this shift: No intake/output data recorded.  Lungs: cta, nonlabored  Cardiovascular: reg  Abd: soft, incision healed, nontender, moderate distention with tympany.   Extremities: no edema, +SCDs  Neuro: alert, nonfocal  Lab Results: BMET Recent Labs    08/30/21 0559 08/31/21 0453  NA 135 137  K 4.0 4.1  CL 103 105  CO2 27 26  GLUCOSE 129* 131*  BUN 22 25*  CREATININE 0.47* 0.55*  CALCIUM 8.5* 8.6*   LFT Hepatic Function Latest Ref Rng & Units 08/31/2021 08/28/2021 08/24/2021  Total Protein 6.5 - 8.1 g/dL 6.0(L) 5.8(L) 6.0(L)  Albumin 3.5 - 5.0 g/dL 2.7(L) 2.6(L) 2.8(L)  AST 15 - 41 U/L 32 17 22  ALT 0 - 44 U/L 57(H) 38 60(H)  Alk Phosphatase 38 - 126 U/L 51 52 60  Total Bilirubin 0.3 - 1.2 mg/dL 0.4 0.2(L) 0.4  Bilirubin, Direct 0.0 - 0.2 mg/dL - - -   Studies/Results:  Anti-infectives: Anti-infectives (From admission, onward)    Start     Dose/Rate Route Frequency Ordered Stop   08/09/21 1000  ceFAZolin (ANCEF) IVPB 2g/100 mL premix  Status:  Discontinued        2 g 200 mL/hr over 30 Minutes Intravenous To Short Stay 08/09/21 0758 08/10/21 1000    08/04/21 2200  oseltamivir (TAMIFLU) capsule 30 mg        30 mg Oral 2 times daily 08/04/21 0822 08/09/21 0959   08/04/21 1000  oseltamivir (TAMIFLU) capsule 75 mg        75 mg Oral  Once 08/04/21 0825 08/04/21 1116   08/03/21 1645  cefTRIAXone (ROCEPHIN) 2 g in sodium chloride 0.9 % 100 mL IVPB  Status:  Discontinued        2 g 200 mL/hr over 30 Minutes Intravenous Every 24 hours 08/03/21 1631 08/06/21 1519   08/03/21 1645  metroNIDAZOLE (FLAGYL) IVPB 500 mg  Status:  Discontinued        500 mg 100 mL/hr over 60 Minutes Intravenous Every 12 hours 08/03/21 1631 08/06/21 1519       Medications: Scheduled Meds:  bisacodyl  10 mg Rectal Daily   Chlorhexidine Gluconate Cloth  6 each Topical Daily   enoxaparin (LOVENOX) injection  40 mg Subcutaneous Q24H   feeding supplement  237 mL Oral TID WC & HS   lip balm  1 application Topical BID   mouth rinse  15 mL Mouth Rinse BID   polyethylene glycol  17 g Oral Daily   sodium chloride flush  10-40 mL Intracatheter Q12H   sodium chloride flush  3 mL Intravenous Q12H   Continuous Infusions:  sodium chloride  methocarbamol (ROBAXIN) IV     PRN Meds:.sodium chloride, acetaminophen, albuterol, alum & mag hydroxide-simeth, bisacodyl, enalaprilat, hydrALAZINE, magic mouthwash, menthol-cetylpyridinium, methocarbamol (ROBAXIN) IV **OR** methocarbamol, metoCLOPramide (REGLAN) injection, metoprolol tartrate, morphine injection, ondansetron **OR** ondansetron (ZOFRAN) IV, phenol, prochlorperazine, simethicone, sodium chloride flush, sodium chloride flush  Assessment/Plan: Patient Active Problem List   Diagnosis Date Noted   SBO (small bowel obstruction) ([place]) 08/12/2021   Aortic atherosclerosis ([place]) 08/12/2021   Protein-calorie malnutrition, severe 08/10/2021   Enteritis 08/03/2021   BPH with obstruction/lower urinary tract symptoms 05/16/2021   Wrist pain, acute, left 03/21/2021   Lumbar radiculopathy, right 01/03/2018   Right hip pain  12/03/2017   Benign prostatic hyperplasia with weak urinary stream 06/07/2017   Essential hypertension 04/26/2017   Seasonal allergies 05/25/2016   Severe major depression, single episode, without psychotic features ([place]) 03/04/2016   Alcohol use disorder, severe, dependence ([place]) 03/04/2016   Cocaine use disorder, moderate, dependence ([place]) 03/04/2016   NICM (nonischemic cardiomyopathy) ([place]) 11/55/2080   Chronic systolic CHF (congestive heart failure) ([place]) 01/23/2016   Epigastric abdominal pain 22/33/6122   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   Diarrhea 06/13/2011   DERMATOPHYTOSIS OF [place] AND [place] 03/18/2008   TOBACCO USER 01/02/2008   Alcohol abuse 11/28/2006   SBO POD 21 s/p ex lap LOA by Dr. Grandville Silos 11/9 with findings of dense adhesion near distal ileum down to the mesentery - afebrile, VSS - NGT initially removed 11/13 but replaced 11/19 due to persistent ileus w/ intolerance of PO.  - CT repeated 11/21 without abscess or definite transition zone, dilated loops of small bowel with air fluid levels consistent with ileus. - SBO protocol started 11/23; KUB 11/25 showed contrast in Rt colon, KUB 11/27 w/ contrast in rectum but persistently dilated small bowel . - slow resolution of ileus, NG removed again 11/30, began vomiting overnight. NPO again and check KUB. pSBO vs ileus.  - mobilize/OOB    FEN - NPO - may have sips with meds, limited ice chips.  ID - ancef periop VTE - SCDs, lovenox Foley - placed periop 11/9, d/c 11/10   AKI - resolved HTN HLD COPD CHF   LOS: 19     LOS: 28 days    Obie Dredge, PA-C General & Trauma Surgery [phone] [facility]

## 2021-09-01 NOTE — Progress Notes (Signed)
PHARMACY - TOTAL PARENTERAL NUTRITION CONSULT NOTE  Indication: Small bowel obstruction, prolonged ileus  Patient Measurements: Height: 6\' 1"  (185.4 cm) Weight: 66.3 kg (146 lb 2.6 oz) IBW/kg (Calculated) : 79.9 TPN AdjBW (KG): 70 Body mass index is 19.28 kg/m. Usual Weight: 74 kg  Assessment:  70 y.o. male with PMH significant for HTN, CHF with LVEF ~25%, GERD, BPH, prior history of alcoholism who presented with complaints of lower abdominal pain, found to have SBO. Patient has remained NPO/CLD, now for more than 7 days and at risk of becoming severely malnourished. Pharmacy is consulted to manage TPN.   Per RD's conversation with patient, reported he was eating well PTA. States he would have abdominal pain when eating. Per patient 11/15, he sometimes skips meals at home even though his wife is a good cook because he just doesn't feel like eating.  Glucose / Insulin: no hx DM, A1c 6.1% - CBGs mostly controlled on low end (hypoglycemic x 1 on 12/2), not on insulin Electrolytes: last labs 12/1 - Mag 1.9 (increased in TPN 12/1; goal >/=2 with ileus), others stable WNL Renal: SCr < 1 stable, BUN up to 25 Hepatic: ALT mildly elevated, AST / TG / tbili WNL, albumin 2.7, prealbumin WNL Intake / Output; MIVF: UOP 3.3 ml/kg/hr, NGT output 200 ml, LBM 12/1  GI Imaging:  11/3 CT abd: SBO  11/4 Abd XR: small partial SBO 11/6 Abd XR: Persistent SBO  11/7 Abd XR: Persistent SBO  11/8 Abd XR: SBO with interval worsening 11/9 Abd XR: post-op free air, SB dilatation 11/21 CT - no abscess/obstruction, consistent with ileus 11/23 Abd XR: incr gaseous distention, may reflect developing obstruction or ileus  11/25 Abd XR: contrast in R colon  GI Surgeries / Procedures:  11/9 ex-lap with LOA   Central access: PICC placed 08/10/21 TPN start date:  08/11/21  Nutritional Goals: Goal continuous TPN rate is 90 mL/hr (provides 119 g of protein and 2334 kcals per day)  RD Estimated Needs Total Energy  Estimated Needs: 2300-2500 Total Protein Estimated Needs: 115-130 grams Total Fluid Estimated Needs: > 2 L  Current Nutrition:  Cyclic 12-hr TPN Ensure BID - 1 dose charted yesterday Back to NPO 12/2 with vomiting overnight and checking KUB, pSBO vs. ileus  Plan:   - Increase TPN back to goal rate today with patient back to NPO per discussion with Surgery - Cycle TPN over 12 hours (rate 98-196 ml/hr, GIR 3.91-7.82 mg/kg/min) - provides 119g AA, 345g CHO, 65g ILE, and 2293 kCal, meeting 100% of needs - Electrolytes in TPN: Na 100 mEq/L, K 30 mEq/L, Ca 5 mEq/L, Mg 8 mEq/L, Phos 31mmol/L; Cl:Ac 1:1 - Add daily MVI and trace elements to TPN  - Adjust to 4x daily cyclic TPN custom CBG checks for now with patient having some low values - watch hypoglycemia, may need to back off cycle to 14hrs - Monitor TPN labs Mon/Thurs and PRN - F/u imaging, Surgery plans   10m, PharmD, BCPS Please check AMION for all [facility] contact numbers Clinical Pharmacist 09/01/2021 8:22 AM

## 2021-09-01 NOTE — Progress Notes (Signed)
Mobility Specialist Progress Note:   09/01/21 1115  Mobility  Activity Ambulated to bathroom  Level of Assistance Modified independent, requires aide device or extra time  Assistive Device Front wheel walker  Distance Ambulated (ft) 15 ft  Mobility Out of bed for toileting  Mobility Response Tolerated well  Mobility performed by Mobility specialist  $Mobility charge 1 Mobility   Pt calling out for assistance in BR. Ambulated back to bed, left with call bell in reach.   Addison Lank Mobility Specialist  Phone [phone]

## 2021-09-01 NOTE — Progress Notes (Signed)
Occupational Therapy Treatment Patient Details Name: [name] MRN: [id number] DOB: [birth year] Today's Date: 09/01/2021   History of present illness 70 y/o male who presents on 08/03/21 due to abdominal pain and diarrhea. CT abdomen/pelvis- dilated fluid and air-filled small intestine consistent with small bowel obstruction. Per surgery, ilieus vs enteritis vs partial SBO. Exploratory lap 11/9.   PMH includes HTN, chronic systolic CHF- EF 85%, prior history of alcoholism   OT comments  Patient received in bed and agreeable to go to rehab gym to address tub transfer. Patient was MI to get to EOB and supervision to transfer to recliner.  Demonstration provided on safe transfer technique using RW to ambulate to bathtub.  Patient was able to return demonstration with supervision. Patient was educated on tub seated for home. Patient has made good gains with OT treatment with OT expected to sign off.    Recommendations for follow up therapy are one component of a multi-disciplinary discharge planning process, led by the attending physician.  Recommendations may be updated based on patient status, additional functional criteria and insurance authorization.    Follow Up Recommendations  No OT follow up    Assistance Recommended at Discharge Intermittent Supervision/Assistance  Equipment Recommendations  None recommended by OT    Recommendations for Other Services      Precautions / Restrictions Precautions Precautions: Fall       Mobility Bed Mobility Overal bed mobility: Independent       Supine to sit: Modified independent (Device/Increase time) Sit to supine: Modified independent (Device/Increase time)   General bed mobility comments: patient able to get in and out of bed without assistance    Transfers Overall transfer level: Modified independent Equipment used: Rolling walker (2 wheels) Transfers: Sit to/from Stand Sit to Stand: Modified independent (Device/Increase  time)           General transfer comment: performed tub transfer with supervision for sfety     Balance Overall balance assessment: Needs assistance Sitting-balance support: Feet supported Sitting balance-Leahy Scale: Good     Standing balance support: No upper extremity supported;During functional activity Standing balance-Leahy Scale: Fair Standing balance comment: stood for tub transfer with supervision for safety                           ADL either performed or assessed with clinical judgement   ADL Overall ADL's : Needs assistance/impaired                                 Tub/ Shower Transfer: Supervision/safety;Tub transfer;Rolling walker (2 wheels) Tub/Shower Transfer Details (indicate cue type and reason): Patient agreed to go to rehab gym for tub transfer and perforrmed with supervsion following demonstration Functional mobility during ADLs: Supervision/safety General ADL Comments: patient demonstrated good understanding of safety with tub transfers    Extremity/Trunk Assessment              Vision       Perception     Praxis      Cognition Arousal/Alertness: Awake/alert Behavior During Therapy: WFL for tasks assessed/performed Overall Cognitive Status: Within Functional Limits for tasks assessed Area of Impairment: Problem solving;Awareness;Safety/judgement;Following commands;Memory;Attention;Orientation                 Orientation Level: Situation Current Attention Level: Selective Memory: Decreased short-term memory Following Commands: Follows one step commands with increased time Safety/Judgement: Decreased awareness of  deficits Awareness: Intellectual Problem Solving: Slow processing;Requires verbal cues;Requires tactile cues General Comments: patient demonstrated good safety with tub transfer          Exercises     Shoulder Instructions       General Comments      Pertinent Vitals/ Pain       Pain  Assessment: No/denies pain  Home Living                                          Prior Functioning/Environment              Frequency  Other (comment) (OT to sign off)        Progress Toward Goals  OT Goals(current goals can now be found in the care plan section)  Progress towards OT goals: Progressing toward goals  Acute Rehab OT Goals Patient Stated Goal: go home OT Goal Formulation: With patient Time For Goal Achievement: 09/05/21 Potential to Achieve Goals: Good ADL Goals Pt Will Perform Grooming: with modified independence;standing Pt Will Perform Lower Body Bathing: with modified independence;sit to/from stand Pt Will Perform Lower Body Dressing: with modified independence;sit to/from stand Pt Will Transfer to Toilet: with modified independence;ambulating;regular height toilet Pt Will Perform Tub/Shower Transfer: Tub transfer;with modified independence;3 in 1;ambulating  Plan Discharge plan remains appropriate    Co-evaluation                 AM-PAC OT "6 Clicks" Daily Activity     Outcome Measure   Help from another person eating meals?: None Help from another person taking care of personal grooming?: A Little Help from another person toileting, which includes using toliet, bedpan, or urinal?: A Little Help from another person bathing (including washing, rinsing, drying)?: A Little Help from another person to put on and taking off regular upper body clothing?: None Help from another person to put on and taking off regular lower body clothing?: A Little 6 Click Score: 20    End of Session Equipment Utilized During Treatment: Rolling walker (2 wheels)  OT Visit Diagnosis: Unsteadiness on feet (R26.81);History of falling (Z91.81);Pain   Activity Tolerance Patient tolerated treatment well   Patient Left in bed;with call bell/phone within reach   Nurse Communication Mobility status        Time: 7628-3151 OT Time Calculation  (min): 20 min  Charges: OT General Charges $OT Visit: 1 Visit OT Treatments $Self Care/Home Management : 8-22 mins  Alfonse Flavors, OTA Acute Rehabilitation Services  Pager [phone] Office [phone]   Dewain Penning 09/01/2021, 2:41 PM

## 2021-09-01 NOTE — Care Management Important Message (Signed)
Important Message  Patient Details  Name: [name] MRN: [id number] Date of Birth: 1951/09/22   Medicare Important Message Given:  Yes     Sherilyn Banker 09/01/2021, 1:27 PM

## 2021-09-01 NOTE — Progress Notes (Signed)
Mobility Specialist Progress Note:   09/01/21 0950  Mobility  Activity Ambulated in hall  Level of Assistance Modified independent, requires aide device or extra time  [place] wheel walker  Distance Ambulated (ft) 600 ft  Mobility Ambulated independently in hallway  Mobility Response Tolerated well  Mobility performed by Mobility specialist  $Mobility charge 1 Mobility   Pt eager for ambulation today. Asx during ambulation, pt back in bed with all needs met.   Nelta Numbers Mobility Specialist  Phone [phone]

## 2021-09-02 ENCOUNTER — Inpatient Hospital Stay (HOSPITAL_COMMUNITY): Payer: Medicare HMO

## 2021-09-02 DIAGNOSIS — K56609 Unspecified intestinal obstruction, unspecified as to partial versus complete obstruction: Secondary | ICD-10-CM | POA: Diagnosis not present

## 2021-09-02 LAB — CBC
HCT: 31.2 % — ABNORMAL LOW (ref 39.0–52.0)
Hemoglobin: 10.2 g/dL — ABNORMAL LOW (ref 13.0–17.0)
MCH: 32.2 pg (ref 26.0–34.0)
MCHC: 32.7 g/dL (ref 30.0–36.0)
MCV: 98.4 fL (ref 80.0–100.0)
Platelets: 142 10*3/uL — ABNORMAL LOW (ref 150–400)
RBC: 3.17 MIL/uL — ABNORMAL LOW (ref 4.22–5.81)
RDW: 12.8 % (ref 11.5–15.5)
WBC: 5 10*3/uL (ref 4.0–10.5)
nRBC: 0 % (ref 0.0–0.2)

## 2021-09-02 LAB — BASIC METABOLIC PANEL
Anion gap: 5 (ref 5–15)
BUN: 22 mg/dL (ref 8–23)
CO2: 27 mmol/L (ref 22–32)
Calcium: 8.5 mg/dL — ABNORMAL LOW (ref 8.9–10.3)
Chloride: 105 mmol/L (ref 98–111)
Creatinine, Ser: 0.54 mg/dL — ABNORMAL LOW (ref 0.61–1.24)
GFR, Estimated: >60 mL/min (ref >60)
Glucose, Bld: 124 mg/dL — ABNORMAL HIGH (ref 70–99)
Potassium: 4.2 mmol/L (ref 3.5–5.1)
Sodium: 137 mmol/L (ref 135–145)

## 2021-09-02 LAB — GLUCOSE, CAPILLARY
Glucose-Capillary: 115 mg/dL — ABNORMAL HIGH (ref 70–99)
Glucose-Capillary: 101 mg/dL — ABNORMAL HIGH (ref 70–99)
Glucose-Capillary: 91 mg/dL (ref 70–99)
Glucose-Capillary: 77 mg/dL (ref 70–99)
Glucose-Capillary: 162 mg/dL — ABNORMAL HIGH (ref 70–99)

## 2021-09-02 LAB — MAGNESIUM: Magnesium: 1.9 mg/dL (ref 1.7–2.4)

## 2021-09-02 LAB — PHOSPHORUS: Phosphorus: 3.2 mg/dL (ref 2.5–4.6)

## 2021-09-02 MED ORDER — IOHEXOL 9 MG/ML PO SOLN
ORAL | Status: AC
  Filled 2021-09-02: qty 1000

## 2021-09-02 MED ORDER — TRAVASOL 10 % IV SOLN
INTRAVENOUS | Status: AC
  Filled 2021-09-02: qty 1185.8

## 2021-09-02 MED ORDER — IOHEXOL 350 MG/ML SOLN
100.0000 mL | Freq: Once | INTRAVENOUS | Status: AC | PRN
  Administered 2021-09-02: 100 mL via INTRAVENOUS

## 2021-09-02 NOTE — Progress Notes (Signed)
PHARMACY - TOTAL PARENTERAL NUTRITION CONSULT NOTE  Indication: Small bowel obstruction, prolonged ileus  Patient Measurements: Height: 6\' 1"  (185.4 cm) Weight: 69.2 kg (152 lb 8.9 oz) IBW/kg (Calculated) : 79.9 TPN AdjBW (KG): 70 Body mass index is 20.13 kg/m. Usual Weight: 74 kg  Assessment:  70 y.o. male with PMH significant for HTN, CHF with LVEF ~25%, GERD, BPH, prior history of alcoholism who presented with complaints of lower abdominal pain, found to have SBO. Patient has remained NPO/CLD, now for more than 7 days and at risk of becoming severely malnourished. Pharmacy is consulted to manage TPN.   Per RD's conversation with patient, reported he was eating well PTA. States he would have abdominal pain when eating. Per patient 11/15, he sometimes skips meals at home even though his wife is a good cook because he just doesn't feel like eating.  12/03 update: per chart review, patient remains distended and is still not able to tolerate PO. Patient is having small amounts of liquid stool intermittendly but no reliable BMs or significant flatus. Repeat CT abdomen and pelvis today with IV and PO contrast to further evaluate.   Glucose / Insulin: no hx DM, A1c 6.1% - CBGs mostly controlled on low end (hypoglycemic x 1 on 12/2), not on insulin, now improved and stable 101-154 Electrolytes: Na 137, K 4.2, Mag 1.9 (increased in TPN 12/1; goal >/=2 with ileus), phos 3.2, others stable WNL Renal: SCr < 1 stable, BUN 22 Hepatic: ALT mildly elevated, AST / TG / tbili WNL, albumin 2.7, prealbumin WNL Intake / Output; MIVF: UOP down at 0.6 ml/kg/hr, NGT output none charted, LBM 12/01 (not significant per chart review)  GI Imaging:  11/3 CT abd: SBO  11/4 Abd XR: small partial SBO 11/6 Abd XR: Persistent SBO  11/7 Abd XR: Persistent SBO  11/8 Abd XR: SBO with interval worsening 11/9 Abd XR: post-op free air, SB dilatation 11/21 CT - no abscess/obstruction, consistent with ileus 11/23 Abd XR:  incr gaseous distention, may reflect developing obstruction or ileus  11/25 Abd XR: contrast in R colon  GI Surgeries / Procedures:  11/9 ex-lap with LOA   Central access: PICC placed 08/10/21 TPN start date:  08/11/21  Nutritional Goals: Goal continuous TPN rate is 90 mL/hr (provides 119 g of protein and 2334 kcals per day)  RD Estimated Needs Total Energy Estimated Needs: 2300-2500 Total Protein Estimated Needs: 115-130 grams Total Fluid Estimated Needs: > 2 L  Current Nutrition:  Cyclic 12-hr TPN Ensure BID -   Back to NPO/CLD 12/2 with vomiting overnight and checking KUB, pSBO vs. ileus  Plan:   - Continue TPN at goal rate with patient back to NPO per discussion with Surgery - Cycle TPN over 12 hours (rate 98-196 ml/hr, GIR 3.91-7.82 mg/kg/min) - provides 119g AA, 345g CHO, 65g ILE, and 2293 kCal, meeting 100% of needs - Electrolytes in TPN: Na 100 mEq/L, K 30 mEq/L, Ca 5 mEq/L, Mg 8 mEq/L, Phos 8mmol/L; Cl:Ac 1:1 - Add daily MVI and trace elements to TPN  - Adjust to 4x daily cyclic TPN custom CBG checks for now with patient having some low values - watch hypoglycemia, may need to decrease to cycling at 14hrs - Monitor TPN labs Mon/Thurs and PRN - F/u imaging, Surgery plans   Thank you for allowing pharmacy to be a part of this patient's care.  10m, PharmD Clinical Pharmacist

## 2021-09-02 NOTE — Progress Notes (Signed)
Patient ID: [name], male   DOB: July 10, 1951, 70 y.o.   MRN: [id number]   Acute Care Surgery Service Progress Note:    Chief Complaint/Subjective: Cc  Denies pain. Having minimal flatus. Denies BM. No taking in much PO. Not walking regularly despite encouragement from RN staff.  Objective: Vital signs in last 24 hours: Temp:  [97.9 F (36.6 C)-98.6 F (37 C)] 98.6 F (37 C) (12/03 0457) Pulse Rate:  [77-81] 81 (12/03 0457) Resp:  [17-19] 18 (12/03 0457) BP: (107-121)/(61-76) 110/61 (12/03 0457) SpO2:  [94 %-100 %] 94 % (12/03 0457) Weight:  [68.2 kg-69.2 kg] 69.2 kg (12/03 0457) Last BM Date: 08/31/21  Intake/Output from previous day: 12/02 0701 - 12/03 0700 In: 2477.6 [P.O.:340; I.V.:2137.6] Out: 950 [Urine:950] Intake/Output this shift: No intake/output data recorded.  Lungs: cta, nonlabored  Cardiovascular: reg  Abd: distended and soft, incision healed, nontender, significant distention with tympany, no peritonitis.  Extremities: no edema, +SCDs  Neuro: alert, nonfocal  Lab Results: BMET Recent Labs    08/31/21 0453 09/02/21 0456  NA 137 137  K 4.1 4.2  CL 105 105  CO2 26 27  GLUCOSE 131* 124*  BUN 25* 22  CREATININE 0.55* 0.54*  CALCIUM 8.6* 8.5*   LFT Hepatic Function Latest Ref Rng & Units 08/31/2021 08/28/2021 08/24/2021  Total Protein 6.5 - 8.1 g/dL 6.0(L) 5.8(L) 6.0(L)  Albumin 3.5 - 5.0 g/dL 2.7(L) 2.6(L) 2.8(L)  AST 15 - 41 U/L 32 17 22  ALT 0 - 44 U/L 57(H) 38 60(H)  Alk Phosphatase 38 - 126 U/L 51 52 60  Total Bilirubin 0.3 - 1.2 mg/dL 0.4 0.2(L) 0.4  Bilirubin, Direct 0.0 - 0.2 mg/dL - - -   Studies/Results:  Anti-infectives: Anti-infectives (From admission, onward)    Start     Dose/Rate Route Frequency Ordered Stop   08/09/21 1000  ceFAZolin (ANCEF) IVPB 2g/100 mL premix  Status:  Discontinued        2 g 200 mL/hr over 30 Minutes Intravenous To Short Stay 08/09/21 0758 08/10/21 1000   08/04/21 2200  oseltamivir (TAMIFLU)  capsule 30 mg        30 mg Oral 2 times daily 08/04/21 0822 08/09/21 0959   08/04/21 1000  oseltamivir (TAMIFLU) capsule 75 mg        75 mg Oral  Once 08/04/21 0825 08/04/21 1116   08/03/21 1645  cefTRIAXone (ROCEPHIN) 2 g in sodium chloride 0.9 % 100 mL IVPB  Status:  Discontinued        2 g 200 mL/hr over 30 Minutes Intravenous Every 24 hours 08/03/21 1631 08/06/21 1519   08/03/21 1645  metroNIDAZOLE (FLAGYL) IVPB 500 mg  Status:  Discontinued        500 mg 100 mL/hr over 60 Minutes Intravenous Every 12 hours 08/03/21 1631 08/06/21 1519       Medications: Scheduled Meds:  bisacodyl  10 mg Rectal Daily   Chlorhexidine Gluconate Cloth  6 each Topical Daily   enoxaparin (LOVENOX) injection  40 mg Subcutaneous Q24H   feeding supplement  1 Container Oral TID BM   lip balm  1 application Topical BID   mouth rinse  15 mL Mouth Rinse BID   Continuous Infusions:   PRN Meds:.acetaminophen, albuterol, alum & mag hydroxide-simeth, bisacodyl, enalaprilat, hydrALAZINE, magic mouthwash, menthol-cetylpyridinium, metoCLOPramide (REGLAN) injection, metoprolol tartrate, morphine injection, [DISCONTINUED] ondansetron **OR** ondansetron (ZOFRAN) IV, phenol, prochlorperazine, simethicone  Assessment/Plan: Patient Active Problem List   Diagnosis Date Noted   SBO (  small bowel obstruction) ([place]) 08/12/2021   Aortic atherosclerosis ([place]) 08/12/2021   Protein-calorie malnutrition, severe 08/10/2021   Enteritis 08/03/2021   BPH with obstruction/lower urinary tract symptoms 05/16/2021   Wrist pain, acute, left 03/21/2021   Lumbar radiculopathy, right 01/03/2018   Right hip pain 12/03/2017   Benign prostatic hyperplasia with weak urinary stream 06/07/2017   Essential hypertension 04/26/2017   Seasonal allergies 05/25/2016   Severe major depression, single episode, without psychotic features ([place]) 03/04/2016   Alcohol use disorder, severe, dependence ([place]) 03/04/2016   Cocaine use disorder, moderate,  dependence ([place]) 03/04/2016   NICM (nonischemic cardiomyopathy) ([place]) 85/92/7639   Chronic systolic CHF (congestive heart failure) ([place]) 01/23/2016   Epigastric abdominal pain 43/20/0379   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   Diarrhea 06/13/2011   DERMATOPHYTOSIS OF SCALP AND BEARD 03/18/2008   TOBACCO USER 01/02/2008   Alcohol abuse 11/28/2006   SBO POD 21 s/p ex lap LOA by Dr. Grandville Silos 11/9 with findings of dense adhesion near distal ileum down to the mesentery - afebrile, VSS - NGT initially removed 11/13 but replaced 11/19 due to persistent ileus w/ intolerance of PO.  - CT repeated 11/21 without abscess or definite transition zone, dilated loops of small bowel with air fluid levels consistent with ileus. - SBO protocol started 11/23; KUB 11/25 showed contrast in Rt colon, KUB 11/27 w/ contrast in rectum but persistently dilated small bowel .  NG removed again 11/30, had vomiting 12/1. Backed off to clears 12/2 . pSBO vs ileus. Remains distended and unable to tolerate enough PO, having some small amts liquid stool intermittendly but no reliable BMs or significant flatus. unclear why. Repeat CT abdomen and pelvis today with IV and PO contrast to further evaluate.  - mobilize/OOB    FEN - CLD, TPN at full support  ID - ancef periop VTE - SCDs, lovenox Foley - placed periop 11/9, d/c 11/10   AKI - resolved HTN HLD COPD CHF   LOS: 19     LOS: 29 days    Obie Dredge, PA-C General & Trauma Surgery [phone] [facility]

## 2021-09-02 NOTE — Progress Notes (Signed)
Patient refused multiple times to walk last night. Patient educated.

## 2021-09-02 NOTE — Progress Notes (Signed)
PROGRESS NOTE        PATIENT DETAILS Name: [name] Age: 70 y.o. Sex: male Date of Birth: 1950-10-24 Admit Date: 08/03/2021 Admitting Physician Darlin Drop, DO [id number], Janene Harvey, NP  Brief Narrative: Patient is a 69 y.o. male with history of HFrEF, EtOH use-who presented with SBO-subsequently underwent exploratory laparotomy on 11/9-further hospital course complicated by prolonged ileus.  See below for further details.  Subjective:   Patient in bed, appears comfortable, denies any headache, no fever, no chest pain or pressure, no shortness of breath , mild lower abd fullness, no flatus or BM   Objective:  Vitals: Blood pressure 110/61, pulse 81, temperature 98.6 F (37 C), resp. rate 18, height 6\' 1"  (1.854 m), weight 69.2 kg, SpO2 94 %.   Exam:  Awake Alert, No new F.N deficits, Normal affect [place].AT,PERRAL Supple Neck, No JVD,   Symmetrical Chest wall movement, Good air movement bilaterally, CTAB RRR,No Gallops, Rubs or new Murmurs,  +ve B.Sounds, Abd is mildly distended but non tender No Cyanosis, Clubbing or edema     Assessment/Plan:  SBO-s/p exploratory laparotomy on 11/9-postop course complicated by prolonged ileus: General surgery following-NG tube DC'd on 11/30-on lcears, still minimal to no bowel activity, defer per CCS, CT today.  HFrEF (EF 20% on TTE in 2017): Volume status stable/compensated.  Does not require diuretics.  Influenza A: Completed course of Tamiflu-he appears asymptomatic.  HTN: BP stable-resume lisinopril when oral intake resumes.  HLD: Resume Crestor when oral intake resumes  CAD: No anginal symptoms-plan is to resume aspirin when oral intake can be resumed.  Nutrition Status: Nutrition Problem: Severe Malnutrition Etiology: chronic illness (CHF) Signs/Symptoms: severe muscle depletion, severe fat depletion Interventions: Boost Breeze, TPN  BMI Estimated body mass index is 20.13 kg/m as  calculated from the following:   Height as of this encounter: 6\' 1"  (1.854 m).   Weight as of this encounter: 69.2 kg.    Procedures: None Consults: CCS DVT Prophylaxis: Lovenox Code Status:Full code  Family Communication: None at bedside  Time spent: 15- minutes-Greater than 50% of this time was spent in counseling, explanation of diagnosis, planning of further management, and coordination of care.   Disposition Plan: Status is: Inpatient  Remains inpatient appropriate because: SBO-s/p laparotomy with prolonged ileus-on TPN-NG tube still in place.   Diet: Diet Order             Diet clear liquid Room service appropriate? Yes; Fluid consistency: Thin  Diet effective now                     Antimicrobial agents: Anti-infectives (From admission, onward)    Start     Dose/Rate Route Frequency Ordered Stop   08/09/21 1000  ceFAZolin (ANCEF) IVPB 2g/100 mL premix  Status:  Discontinued        2 g 200 mL/hr over 30 Minutes Intravenous To Short Stay 08/09/21 0758 08/10/21 1000   08/04/21 2200  oseltamivir (TAMIFLU) capsule 30 mg        30 mg Oral 2 times daily 08/04/21 0822 08/09/21 0959   08/04/21 1000  oseltamivir (TAMIFLU) capsule 75 mg        75 mg Oral  Once 08/04/21 0825 08/04/21 1116   08/03/21 1645  cefTRIAXone (ROCEPHIN) 2 g in sodium chloride 0.9 % 100 mL IVPB  Status:  Discontinued        2 g 200 mL/hr over 30 Minutes Intravenous Every 24 hours 08/03/21 1631 08/06/21 1519   08/03/21 1645  metroNIDAZOLE (FLAGYL) IVPB 500 mg  Status:  Discontinued        500 mg 100 mL/hr over 60 Minutes Intravenous Every 12 hours 08/03/21 1631 08/06/21 1519        MEDICATIONS: Scheduled Meds:  bisacodyl  10 mg Rectal Daily   Chlorhexidine Gluconate Cloth  6 each Topical Daily   enoxaparin (LOVENOX) injection  40 mg Subcutaneous Q24H   feeding supplement  1 Container Oral TID BM   lip balm  1 application Topical BID   mouth rinse  15 mL Mouth Rinse BID   Continuous  Infusions:   PRN Meds:.acetaminophen, albuterol, alum & mag hydroxide-simeth, bisacodyl, enalaprilat, hydrALAZINE, magic mouthwash, menthol-cetylpyridinium, metoCLOPramide (REGLAN) injection, metoprolol tartrate, morphine injection, [DISCONTINUED] ondansetron **OR** ondansetron (ZOFRAN) IV, phenol, prochlorperazine, simethicone   I have personally reviewed following labs and imaging studies  LABORATORY DATA:  Recent Labs  Lab 09/02/21 0456  WBC 5.0  HGB 10.2*  HCT 31.2*  PLT 142*  MCV 98.4  MCH 32.2  MCHC 32.7  RDW 12.8    Recent Labs  Lab 08/28/21 0237 08/29/21 0422 08/30/21 0559 08/31/21 0453 09/02/21 0456  NA 134* 138 135 137 137  K 3.5 4.0 4.0 4.1 4.2  CL 104 105 103 105 105  CO2 23 26 27 26 27   GLUCOSE 150* 128* 129* 131* 124*  BUN 25* 24* 22 25* 22  CREATININE 0.53* 0.53* 0.47* 0.55* 0.54*  CALCIUM 8.1* 8.6* 8.5* 8.6* 8.5*  AST 17  --   --  32  --   ALT 38  --   --  57*  --   ALKPHOS 52  --   --  51  --   BILITOT 0.2*  --   --  0.4  --   ALBUMIN 2.6*  --   --  2.7*  --   MG 1.8  --   --  1.9 1.9     RADIOLOGY STUDIES/RESULTS: DG Abd Portable 1V  Result Date: 09/01/2021 CLINICAL DATA:  Ileus, abdominal pain. Exploratory laparotomy on 08/09/2021 for bowel obstruction. EXAM: PORTABLE ABDOMEN - 1 VIEW COMPARISON:  Abdominal radiograph 08/27/2021 FINDINGS: Single supine image does not include the entirety of the upper abdomen or right lateral abdomen. Interval removal of enteric tube. Multiple air-filled dilated loops of small bowel and colon. No residual oral contrast. No free intraperitoneal air given the limits of this single supine image. IMPRESSION: Interval removal of the enteric tube with increased gaseous dilation of the small bowel and colon, consistent with recurrent ileus. No residual oral contrast. Electronically Signed   By: 08/29/2021 M.D.   On: 09/01/2021 11:16     LOS: 29 days   Signature  14/11/2020 M.D on 09/02/2021 at 9:49 AM   -  To  page go to [email or web address]

## 2021-09-02 NOTE — Progress Notes (Addendum)
Mobility Specialist Progress Note   09/02/21 1328  Mobility  Activity Ambulated in hall  Level of Assistance Modified independent, requires aide device or extra time  Assistive Device Front wheel walker  Distance Ambulated (ft) 575 ft  Mobility Ambulated independently in hallway  Mobility Response Tolerated well  Mobility performed by Mobility specialist  $Mobility charge 1 Mobility   Received pt in chair having no complaints and agreeable to mobility. Mod I to BR before session, successful void. Asymptomatic throughout ambulation, returned back to bed w/ call bell by side and all needs met.  Holland Falling Mobility Specialist Phone Number [phone]

## 2021-09-03 DIAGNOSIS — K56609 Unspecified intestinal obstruction, unspecified as to partial versus complete obstruction: Secondary | ICD-10-CM | POA: Diagnosis not present

## 2021-09-03 LAB — GLUCOSE, CAPILLARY
Glucose-Capillary: 100 mg/dL — ABNORMAL HIGH (ref 70–99)
Glucose-Capillary: 124 mg/dL — ABNORMAL HIGH (ref 70–99)
Glucose-Capillary: 126 mg/dL — ABNORMAL HIGH (ref 70–99)
Glucose-Capillary: 141 mg/dL — ABNORMAL HIGH (ref 70–99)
Glucose-Capillary: 156 mg/dL — ABNORMAL HIGH (ref 70–99)
Glucose-Capillary: 74 mg/dL (ref 70–99)
Glucose-Capillary: 91 mg/dL (ref 70–99)

## 2021-09-03 LAB — COMPREHENSIVE METABOLIC PANEL
ALT: 28 U/L (ref 0–44)
AST: 17 U/L (ref 15–41)
Albumin: 2.6 g/dL — ABNORMAL LOW (ref 3.5–5.0)
Alkaline Phosphatase: 47 U/L (ref 38–126)
Anion gap: 5 (ref 5–15)
BUN: 23 mg/dL (ref 8–23)
CO2: 27 mmol/L (ref 22–32)
Calcium: 8.4 mg/dL — ABNORMAL LOW (ref 8.9–10.3)
Chloride: 103 mmol/L (ref 98–111)
Creatinine, Ser: 0.53 mg/dL — ABNORMAL LOW (ref 0.61–1.24)
GFR, Estimated: >60 mL/min (ref >60)
Glucose, Bld: 99 mg/dL (ref 70–99)
Potassium: 4.4 mmol/L (ref 3.5–5.1)
Sodium: 135 mmol/L (ref 135–145)
Total Bilirubin: 0.4 mg/dL (ref 0.3–1.2)
Total Protein: 5.6 g/dL — ABNORMAL LOW (ref 6.5–8.1)

## 2021-09-03 LAB — CBC
HCT: 30.4 % — ABNORMAL LOW (ref 39.0–52.0)
Hemoglobin: 10.1 g/dL — ABNORMAL LOW (ref 13.0–17.0)
MCH: 32.6 pg (ref 26.0–34.0)
MCHC: 33.2 g/dL (ref 30.0–36.0)
MCV: 98.1 fL (ref 80.0–100.0)
Platelets: 133 10*3/uL — ABNORMAL LOW (ref 150–400)
RBC: 3.1 MIL/uL — ABNORMAL LOW (ref 4.22–5.81)
RDW: 12.8 % (ref 11.5–15.5)
WBC: 3.4 10*3/uL — ABNORMAL LOW (ref 4.0–10.5)
nRBC: 0 % (ref 0.0–0.2)

## 2021-09-03 LAB — MAGNESIUM: Magnesium: 2 mg/dL (ref 1.7–2.4)

## 2021-09-03 MED ORDER — ASPIRIN 81 MG PO CHEW
81.0000 mg | CHEWABLE_TABLET | Freq: Every day | ORAL | Status: DC
  Administered 2021-09-03 – 2021-09-08 (×6): 81 mg via ORAL
  Filled 2021-09-03 (×6): qty 1

## 2021-09-03 MED ORDER — MORPHINE SULFATE (PF) 2 MG/ML IV SOLN: 1.0000 mg | INTRAVENOUS | Status: DC | PRN

## 2021-09-03 MED ORDER — ROSUVASTATIN CALCIUM 5 MG PO TABS
5.0000 mg | ORAL_TABLET | Freq: Every day | ORAL | Status: DC
  Administered 2021-09-03 – 2021-09-08 (×6): 5 mg via ORAL
  Filled 2021-09-03 (×6): qty 1

## 2021-09-03 MED ORDER — TRAVASOL 10 % IV SOLN
INTRAVENOUS | Status: AC
  Filled 2021-09-03: qty 1185.8

## 2021-09-03 NOTE — Progress Notes (Signed)
Patient ID: [name], male   DOB: [birth year], 70 y.o.   MRN: [id number]   Acute Care Surgery Service Progress Note:    Chief Complaint/Subjective: Cc  NAEO. No changes. Taking in limited clear liquids. Denies nausea or vomiting. Says he will walk more today. Denies significant belching. Says he is having flatus.   Objective: Vital signs in last 24 hours: Temp:  [97.3 F (36.3 C)-98.9 F (37.2 C)] 98.9 F (37.2 C) (12/04 0437) Pulse Rate:  [67-85] 75 (12/04 0437) Resp:  [18-19] 19 (12/04 0437) BP: (99-146)/(56-79) 99/56 (12/04 0437) SpO2:  [95 %-100 %] 97 % (12/04 0437) Weight:  [68 kg] 68 kg (12/04 0437) Last BM Date: 08/31/21  Intake/Output from previous day: 12/03 0701 - 12/04 0700 In: 1976.4 [I.V.:1976.4] Out: 1401 [Urine:1400; Stool:1] Intake/Output this shift: No intake/output data recorded.  Lungs: cta, nonlabored  Cardiovascular: reg  Abd: distended and soft, incision healed, nontender, significant distention with tympany, no peritonitis.  Extremities: no edema, +SCDs  Neuro: alert, nonfocal  Lab Results: BMET Recent Labs    09/02/21 0456 09/03/21 0502  NA 137 135  K 4.2 4.4  CL 105 103  CO2 27 27  GLUCOSE 124* 99  BUN 22 23  CREATININE 0.54* 0.53*  CALCIUM 8.5* 8.4*   LFT Hepatic Function Latest Ref Rng & Units 09/03/2021 08/31/2021 08/28/2021  Total Protein 6.5 - 8.1 g/dL 5.6(L) 6.0(L) 5.8(L)  Albumin 3.5 - 5.0 g/dL 2.6(L) 2.7(L) 2.6(L)  AST 15 - 41 U/L 17 32 17  ALT 0 - 44 U/L 28 57(H) 38  Alk Phosphatase 38 - 126 U/L 47 51 52  Total Bilirubin 0.3 - 1.2 mg/dL 0.4 0.4 0.2(L)  Bilirubin, Direct 0.0 - 0.2 mg/dL - - -   Studies/Results:  Anti-infectives: Anti-infectives (From admission, onward)    Start     Dose/Rate Route Frequency Ordered Stop   08/09/21 1000  ceFAZolin (ANCEF) IVPB 2g/100 mL premix  Status:  Discontinued        2 g 200 mL/hr over 30 Minutes Intravenous To Short Stay 08/09/21 0758 08/10/21 1000   08/04/21 2200   oseltamivir (TAMIFLU) capsule 30 mg        30 mg Oral 2 times daily 08/04/21 0822 08/09/21 0959   08/04/21 1000  oseltamivir (TAMIFLU) capsule 75 mg        75 mg Oral  Once 08/04/21 0825 08/04/21 1116   08/03/21 1645  cefTRIAXone (ROCEPHIN) 2 g in sodium chloride 0.9 % 100 mL IVPB  Status:  Discontinued        2 g 200 mL/hr over 30 Minutes Intravenous Every 24 hours 08/03/21 1631 08/06/21 1519   08/03/21 1645  metroNIDAZOLE (FLAGYL) IVPB 500 mg  Status:  Discontinued        500 mg 100 mL/hr over 60 Minutes Intravenous Every 12 hours 08/03/21 1631 08/06/21 1519       Medications: Scheduled Meds:  bisacodyl  10 mg Rectal Daily   Chlorhexidine Gluconate Cloth  6 each Topical Daily   enoxaparin (LOVENOX) injection  40 mg Subcutaneous Q24H   feeding supplement  1 Container Oral TID BM   lip balm  1 application Topical BID   mouth rinse  15 mL Mouth Rinse BID   Continuous Infusions:   PRN Meds:.acetaminophen, albuterol, alum & mag hydroxide-simeth, bisacodyl, enalaprilat, hydrALAZINE, magic mouthwash, menthol-cetylpyridinium, metoprolol tartrate, morphine injection, [DISCONTINUED] ondansetron **OR** ondansetron (ZOFRAN) IV, phenol, prochlorperazine, simethicone  Assessment/Plan: Patient Active Problem List   Diagnosis Date Noted  SBO (small bowel obstruction) ([place]) 08/12/2021   Aortic atherosclerosis ([place]) 08/12/2021   Protein-calorie malnutrition, severe 08/10/2021   Enteritis 08/03/2021   BPH with obstruction/lower urinary tract symptoms 05/16/2021   Wrist pain, acute, left 03/21/2021   Lumbar radiculopathy, right 01/03/2018   Right hip pain 12/03/2017   Benign prostatic hyperplasia with weak urinary stream 06/07/2017   Essential hypertension 04/26/2017   Seasonal allergies 05/25/2016   Severe major depression, single episode, without psychotic features ([place]) 03/04/2016   Alcohol use disorder, severe, dependence ([place]) 03/04/2016   Cocaine use disorder, moderate, dependence  ([place]) 03/04/2016   NICM (nonischemic cardiomyopathy) ([place]) 17/08/7870   Chronic systolic CHF (congestive heart failure) ([place]) 01/23/2016   Epigastric abdominal pain 83/67/2550   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   Diarrhea 06/13/2011   DERMATOPHYTOSIS OF SCALP AND BEARD 03/18/2008   TOBACCO USER 01/02/2008   Alcohol abuse 11/28/2006   SBO POD 21 s/p ex lap LOA by Dr. Grandville Silos 11/9 with findings of dense adhesion near distal ileum down to the mesentery - afebrile, VSS - NGT initially removed 11/13 but replaced 11/19 due to persistent ileus w/ intolerance of PO.  - CT repeated 11/21 without abscess or definite transition zone, dilated loops of small bowel with air fluid levels consistent with ileus. - SBO protocol started 11/23; KUB 11/25 showed contrast in Rt colon, KUB 11/27 w/ contrast in rectum but persistently dilated small bowel . - NG removed again 11/30, had vomiting 12/1. Backed off to clears 12/2 .  - CT A/P repeated yesterday 12/3 -- ongoing diffuse dilation of the small bowel without transition zone. This is consistent with ileus, not obstruction. If patient has emesis would place NG tube. Continue full support TPN. - mobilize/OOB     FEN - CLD, TPN at full support  ID - ancef periop VTE - SCDs, lovenox Foley - placed periop 11/9, d/c 11/10   AKI - resolved HTN HLD COPD CHF   LOS: 19     LOS: 30 days    Obie Dredge, PA-C General & Trauma Surgery [phone] [facility]

## 2021-09-03 NOTE — Progress Notes (Signed)
Mobility Specialist Progress Note   09/03/21 1140  Mobility  Activity Ambulated in room;Ambulated to bathroom  Level of Assistance Standby assist, set-up cues, supervision of patient - no hands on  Assistive Device Front wheel walker  Distance Ambulated (ft) 15 ft  Mobility Out of bed for toileting;Ambulated with assistance in room  Mobility Response Tolerated well  Mobility performed by Mobility specialist  $Mobility charge 1 Mobility   Mod I for assistance to BR. Successful BM. Returned back to bed w/ no complaints, call bell by side and NT in room.  Frederico Hamman Mobility Specialist Phone Number [phone]

## 2021-09-03 NOTE — Progress Notes (Signed)
PHARMACY - TOTAL PARENTERAL NUTRITION CONSULT NOTE  Indication: Small bowel obstruction, prolonged ileus  Patient Measurements: Height: 6\' 1"  (185.4 cm) Weight: 68 kg (149 lb 14.6 oz) IBW/kg (Calculated) : 79.9 TPN AdjBW (KG): 70 Body mass index is 19.78 kg/m. Usual Weight: 74 kg  Assessment:  70 y.o. male with PMH significant for HTN, CHF with LVEF ~25%, GERD, BPH, prior history of alcoholism who presented with complaints of lower abdominal pain, found to have SBO. Patient has remained NPO/CLD, now for more than 7 days and at risk of becoming severely malnourished. Pharmacy is consulted to manage TPN.   Per RD's conversation with patient, reported he was eating well PTA. States he would have abdominal pain when eating. Per patient 11/15, he sometimes skips meals at home even though his wife is a good cook because he just doesn't feel like eating.  12/03 update: per chart review, patient remains distended and is still not able to tolerate PO. Patient is having small amounts of liquid stool intermittendly but no reliable BMs or significant flatus. Repeat CT abdomen and pelvis today with IV and PO contrast to further evaluate.   Glucose / Insulin: no hx DM, A1c 6.1% - CBGs mostly controlled on low end (hypoglycemic x 1 on 12/2), not on insulin, now improved and stable 101-154 Electrolytes: Na 135, K 4.4, Mag 2.0 (increased in TPN 12/1; goal >/=2 with ileus), phos 3.2, others stable WNL Renal: SCr < 1 stable, BUN 23 Hepatic: LFTs now wnl, tbili wnl, albumin 2.6, prealbumin WNL Intake / Output; MIVF: UOP down at 0.4 ml/kg/hr, NGT output none charted, LBM 12/03 (not significant per chart review)  GI Imaging:  11/3 CT abd: SBO  11/4 Abd XR: small partial SBO 11/6 Abd XR: Persistent SBO  11/7 Abd XR: Persistent SBO  11/8 Abd XR: SBO with interval worsening 11/9 Abd XR: post-op free air, SB dilatation 11/21 CT - no abscess/obstruction, consistent with ileus 11/23 Abd XR: incr gaseous  distention, may reflect developing obstruction or ileus  11/25 Abd XR: contrast in R colon  GI Surgeries / Procedures:  11/9 ex-lap with LOA   Central access: PICC placed 08/10/21 TPN start date:  08/11/21  Nutritional Goals: Goal continuous TPN rate is 90 mL/hr (provides 119 g of protein and 2334 kcals per day)  RD Estimated Needs Total Energy Estimated Needs: 2300-2500 Total Protein Estimated Needs: 115-130 grams Total Fluid Estimated Needs: > 2 L  Current Nutrition:  Cyclic 12-hr TPN Ensure BID -   Back to NPO/CLD 12/2 with vomiting overnight and checking KUB, pSBO vs. ileus  Plan:   - Continue TPN at goal rate with patient back to NPO per discussion with Surgery - Cycle TPN over 12 hours (rate 98-196 ml/hr, GIR 3.91-7.82 mg/kg/min) - provides 119g AA, 345g CHO, 65g ILE, and 2293 kCal, meeting 100% of needs - Electrolytes in TPN: Na 100 mEq/L, K 30 mEq/L, Ca 5 mEq/L, Mg 8 mEq/L, Phos 28mmol/L; Cl:Ac 1:1 - Add daily MVI and trace elements to TPN  - Adjust to 4x daily cyclic TPN custom CBG checks for now, patient having some low values - watch hypoglycemia, may need to decrease to cycling at 14hrs  - Monitor TPN labs Mon/Thurs and PRN - F/u imaging, Surgery plans   Thank you for allowing pharmacy to be a part of this patient's care.  10m, PharmD Clinical Pharmacist

## 2021-09-03 NOTE — Progress Notes (Signed)
PROGRESS NOTE        PATIENT DETAILS Name: [name] Age: 70 y.o. Sex: male Date of Birth: Jul 18, 1951 Admit Date: 08/03/2021 Admitting Physician Darlin Drop, DO IDP:[id number], Janene Harvey, NP  Brief Narrative: Patient is a 70 y.o. male with history of HFrEF, EtOH use-who presented with SBO-subsequently underwent exploratory laparotomy on 11/9-further hospital course complicated by prolonged ileus.  See below for further details.  Subjective:   Patient in bed, appears comfortable, denies any headache, no fever, no chest pain or pressure, no shortness of breath , no abdominal pain. No new focal weakness, no flatus or BM   Objective:  Vitals: Blood pressure 108/66, pulse 73, temperature 98.3 F (36.8 C), temperature source Oral, resp. rate 17, height 6\' 1"  (1.854 m), weight 68 kg, SpO2 100 %.   Exam:  Awake Alert, No new F.N deficits, Normal affect [place].AT,PERRAL Supple Neck, No JVD,   Symmetrical Chest wall movement, Good air movement bilaterally, CTAB RRR,No Gallops, Rubs or new Murmurs,  few B.Sounds, Abd is distended but non tender No Cyanosis, Clubbing or edema      Assessment/Plan:  SBO-s/p exploratory laparotomy on 11/9-postop course complicated by prolonged ileus: General surgery following-NG tube DC'd on 11/30-on lcears, still minimal to no bowel activity, defer management to CCS - on TNA + clears.  HFrEF (EF 20% on TTE in 2017): Volume status stable/compensated.  Does not require diuretics.  Influenza A: Completed course of Tamiflu-he appears asymptomatic.  HTN: BP stable-resume lisinopril when oral intake resumes.  HLD: PO Crestor.  CAD: No anginal symptoms-PO ASA.  Nutrition Status: Nutrition Problem: Severe Malnutrition Etiology: chronic illness (CHF) Signs/Symptoms: severe muscle depletion, severe fat depletion Interventions: Boost Breeze, TPN  BMI Estimated body mass index is 19.78 kg/m as calculated from the  following:   Height as of this encounter: 6\' 1"  (1.854 m).   Weight as of this encounter: 68 kg.    Procedures: None Consults: CCS DVT Prophylaxis: Lovenox Code Status:Full code  Family Communication: None at bedside  Time spent: 15- minutes-Greater than 50% of this time was spent in counseling, explanation of diagnosis, planning of further management, and coordination of care.   Disposition Plan: Status is: Inpatient  Remains inpatient appropriate because: SBO-s/p laparotomy with prolonged ileus-on TPN-NG tube still in place.   Diet: Diet Order             Diet clear liquid Room service appropriate? Yes; Fluid consistency: Thin  Diet effective now                     Antimicrobial agents: Anti-infectives (From admission, onward)    Start     Dose/Rate Route Frequency Ordered Stop   08/09/21 1000  ceFAZolin (ANCEF) IVPB 2g/100 mL premix  Status:  Discontinued        2 g 200 mL/hr over 30 Minutes Intravenous To Short Stay 08/09/21 0758 08/10/21 1000   08/04/21 2200  oseltamivir (TAMIFLU) capsule 30 mg        30 mg Oral 2 times daily 08/04/21 0822 08/09/21 0959   08/04/21 1000  oseltamivir (TAMIFLU) capsule 75 mg        75 mg Oral  Once 08/04/21 0825 08/04/21 1116   08/03/21 1645  cefTRIAXone (ROCEPHIN) 2 g in sodium chloride 0.9 % 100 mL IVPB  Status:  Discontinued  2 g 200 mL/hr over 30 Minutes Intravenous Every 24 hours 08/03/21 1631 08/06/21 1519   08/03/21 1645  metroNIDAZOLE (FLAGYL) IVPB 500 mg  Status:  Discontinued        500 mg 100 mL/hr over 60 Minutes Intravenous Every 12 hours 08/03/21 1631 08/06/21 1519        MEDICATIONS: Scheduled Meds:  aspirin  81 mg Oral Daily   bisacodyl  10 mg Rectal Daily   Chlorhexidine Gluconate Cloth  6 each Topical Daily   enoxaparin (LOVENOX) injection  40 mg Subcutaneous Q24H   feeding supplement  1 Container Oral TID BM   lip balm  1 application Topical BID   mouth rinse  15 mL Mouth Rinse BID    rosuvastatin  5 mg Oral Daily   Continuous Infusions:  TPN CYCLIC-ADULT (ION)      PRN Meds:.acetaminophen, albuterol, alum & mag hydroxide-simeth, bisacodyl, enalaprilat, hydrALAZINE, magic mouthwash, menthol-cetylpyridinium, metoprolol tartrate, morphine injection, [DISCONTINUED] ondansetron **OR** ondansetron (ZOFRAN) IV, phenol, prochlorperazine, simethicone   I have personally reviewed following labs and imaging studies  LABORATORY DATA:  Recent Labs  Lab 09/02/21 0456 09/03/21 0502  WBC 5.0 3.4*  HGB 10.2* 10.1*  HCT 31.2* 30.4*  PLT 142* 133*  MCV 98.4 98.1  MCH 32.2 32.6  MCHC 32.7 33.2  RDW 12.8 12.8    Recent Labs  Lab 08/28/21 0237 08/29/21 0422 08/30/21 0559 08/31/21 0453 09/02/21 0456 09/03/21 0502  NA 134* 138 135 137 137 135  K 3.5 4.0 4.0 4.1 4.2 4.4  CL 104 105 103 105 105 103  CO2 23 26 27 26 27 27   GLUCOSE 150* 128* 129* 131* 124* 99  BUN 25* 24* 22 25* 22 23  CREATININE 0.53* 0.53* 0.47* 0.55* 0.54* 0.53*  CALCIUM 8.1* 8.6* 8.5* 8.6* 8.5* 8.4*  AST 17  --   --  32  --  17  ALT 38  --   --  57*  --  28  ALKPHOS 52  --   --  51  --  47  BILITOT 0.2*  --   --  0.4  --  0.4  ALBUMIN 2.6*  --   --  2.7*  --  2.6*  MG 1.8  --   --  1.9 1.9 2.0     RADIOLOGY STUDIES/RESULTS: CT ABDOMEN PELVIS W CONTRAST  Result Date: 09/02/2021 CLINICAL DATA:  Abdominal distension. EXAM: CT ABDOMEN AND PELVIS WITH CONTRAST TECHNIQUE: Multidetector CT imaging of the abdomen and pelvis was performed using the standard protocol following bolus administration of intravenous contrast. CONTRAST:  14/11/2020 OMNIPAQUE IOHEXOL 350 MG/ML SOLN COMPARISON:  August 21, 2021 FINDINGS: Lower chest: Partially visualized scarring versus atelectasis in the lung bases. Hepatobiliary: Redemonstrated are multiple liver cysts. Small amount of layering sludge in the gallbladder. No CT evidence of acute cholecystitis. Pancreas: Unremarkable. No pancreatic ductal dilatation or surrounding  inflammatory changes. Spleen: Normal in size without focal abnormality. Adrenals/Urinary Tract: Adrenal glands are unremarkable. Kidneys are normal, without renal calculi, focal lesion, or hydronephrosis. Renal cyst noted. Bladder is decompressed and therefore poorly visualized. Stomach/Bowel: Persistent marked gas and fluid distension of small bowel loops throughout the abdomen without identifiable transitional point. The findings are stable to worsened from the last study dated November 21st 2022. The previously noted free gas in the right upper quadrant has improved. Vascular/Lymphatic: No significant vascular findings are present. No enlarged abdominal or pelvic lymph nodes. Reproductive: Prostate is unremarkable. Other: No abdominal wall hernia or abnormality. Stable  postsurgical changes. Musculoskeletal: No acute or significant osseous findings. IMPRESSION: 1. Persistent marked gas and fluid distension of small bowel loops throughout the abdomen without identifiable transitional point. The findings are stable to worsened from the last study dated November 21st 2022. 2. Interval improvement in the previously noted free gas in the right upper quadrant. 3. Stable liver and renal cysts. 4. Small amount of layering sludge in the gallbladder. No CT evidence of acute cholecystitis. Electronically Signed   By: Ted Mcalpine M.D.   On: 09/02/2021 19:22     LOS: 30 days   Signature  Susa Raring M.D on 09/03/2021 at 12:00 PM   -  To page go to [email or web address]

## 2021-09-04 DIAGNOSIS — K56609 Unspecified intestinal obstruction, unspecified as to partial versus complete obstruction: Secondary | ICD-10-CM | POA: Diagnosis not present

## 2021-09-04 LAB — COMPREHENSIVE METABOLIC PANEL
ALT: 23 U/L (ref 0–44)
AST: 15 U/L (ref 15–41)
Albumin: 2.6 g/dL — ABNORMAL LOW (ref 3.5–5.0)
Alkaline Phosphatase: 43 U/L (ref 38–126)
Anion gap: 4 — ABNORMAL LOW (ref 5–15)
BUN: 19 mg/dL (ref 8–23)
CO2: 26 mmol/L (ref 22–32)
Calcium: 8.3 mg/dL — ABNORMAL LOW (ref 8.9–10.3)
Chloride: 104 mmol/L (ref 98–111)
Creatinine, Ser: 0.51 mg/dL — ABNORMAL LOW (ref 0.61–1.24)
GFR, Estimated: >60 mL/min (ref >60)
Glucose, Bld: 101 mg/dL — ABNORMAL HIGH (ref 70–99)
Potassium: 4 mmol/L (ref 3.5–5.1)
Sodium: 134 mmol/L — ABNORMAL LOW (ref 135–145)
Total Bilirubin: 0.4 mg/dL (ref 0.3–1.2)
Total Protein: 5.8 g/dL — ABNORMAL LOW (ref 6.5–8.1)

## 2021-09-04 LAB — GLUCOSE, CAPILLARY
Glucose-Capillary: 94 mg/dL (ref 70–99)
Glucose-Capillary: 98 mg/dL (ref 70–99)
Glucose-Capillary: 93 mg/dL (ref 70–99)
Glucose-Capillary: 101 mg/dL — ABNORMAL HIGH (ref 70–99)
Glucose-Capillary: 107 mg/dL — ABNORMAL HIGH (ref 70–99)
Glucose-Capillary: 109 mg/dL — ABNORMAL HIGH (ref 70–99)
Glucose-Capillary: 109 mg/dL — ABNORMAL HIGH (ref 70–99)
Glucose-Capillary: 112 mg/dL — ABNORMAL HIGH (ref 70–99)
Glucose-Capillary: 117 mg/dL — ABNORMAL HIGH (ref 70–99)
Glucose-Capillary: 117 mg/dL — ABNORMAL HIGH (ref 70–99)
Glucose-Capillary: 136 mg/dL — ABNORMAL HIGH (ref 70–99)
Glucose-Capillary: 149 mg/dL — ABNORMAL HIGH (ref 70–99)
Glucose-Capillary: 159 mg/dL — ABNORMAL HIGH (ref 70–99)
Glucose-Capillary: 183 mg/dL — ABNORMAL HIGH (ref 70–99)
Glucose-Capillary: 191 mg/dL — ABNORMAL HIGH (ref 70–99)
Glucose-Capillary: 199 mg/dL — ABNORMAL HIGH (ref 70–99)
Glucose-Capillary: 91 mg/dL (ref 70–99)
Glucose-Capillary: 98 mg/dL (ref 70–99)
Glucose-Capillary: 62 mg/dL — ABNORMAL LOW (ref 70–99)
Glucose-Capillary: 142 mg/dL — ABNORMAL HIGH (ref 70–99)

## 2021-09-04 LAB — TRIGLYCERIDES: Triglycerides: 36 mg/dL (ref <150)

## 2021-09-04 LAB — PHOSPHORUS: Phosphorus: 3.7 mg/dL (ref 2.5–4.6)

## 2021-09-04 LAB — MAGNESIUM: Magnesium: 2 mg/dL (ref 1.7–2.4)

## 2021-09-04 MED ORDER — TRAVASOL 10 % IV SOLN
INTRAVENOUS | Status: AC
  Filled 2021-09-04: qty 1185.8

## 2021-09-04 MED ORDER — ENSURE SURGERY PO LIQD
237.0000 mL | Freq: Two times a day (BID) | ORAL | Status: DC
  Administered 2021-09-04: 237 mL via ORAL
  Filled 2021-09-04 (×2): qty 237

## 2021-09-04 MED ORDER — ACETAMINOPHEN 325 MG PO TABS
650.0000 mg | ORAL_TABLET | Freq: Four times a day (QID) | ORAL | Status: DC | PRN
  Administered 2021-09-05: 650 mg via ORAL
  Filled 2021-09-04: qty 2

## 2021-09-04 NOTE — Progress Notes (Signed)
Pt able to ambulated with mobility tech and sat up in the chair for lunch and will be back up for dinner.

## 2021-09-04 NOTE — Progress Notes (Signed)
OT Sign off Note  Patient Details Name: [name] MRN: [id number] DOB: [birth year]   Cancelled Treatment:     Have reviewed last treatment completed by COTA. Pt performing near baseline and questions about safe dc to home answered in last session. All acute OT needs met. Will sign off.   [place], OTR/L Acute Rehab Pager: [phone] Office: [phone] 09/04/2021, 7:14 AM

## 2021-09-04 NOTE — Progress Notes (Signed)
Mobility Specialist Progress Note:   09/04/21 1500  Mobility  Activity Ambulated to bathroom;Ambulated in hall  Level of Assistance Modified independent, requires aide device or extra time  Assistive Device Front wheel walker  Distance Ambulated (ft) 590 ft  Mobility Ambulated independently in hallway  Mobility Response Tolerated well  Mobility performed by Mobility specialist  $Mobility charge 1 Mobility   Pt asx during ambulation. BR before session to void. Pt back in bed with all needs met.   Nelta Numbers Mobility Specialist  Phone [phone]

## 2021-09-04 NOTE — Progress Notes (Signed)
Initial Nutrition Assessment  DOCUMENTATION CODES:   Severe malnutrition in context of chronic illness  INTERVENTION:   Advance diet as medically appropriate Encourage good PO intake  Continue Boost Breeze po TID, each supplement provides 250 kcal and 9 grams of protein Discontinue Ensure Surgery Continue TPN per Pharmacy Recommend continuing TPN at goal until pt demonstrates ability to eat 60% of needs via PO.  NUTRITION DIAGNOSIS:   Severe Malnutrition related to chronic illness (CHF) as evidenced by severe muscle depletion, severe fat depletion. - Ongoing  GOAL:   Patient will meet greater than or equal to 90% of their needs - Met via TPN  MONITOR:   PO intake, Supplement acceptance, Diet advancement, Weight trends  REASON FOR ASSESSMENT:   NPO/Clear Liquid Diet    ASSESSMENT:   70 y.o. male presented to the ED with lower abdominal pain. PMH includes CHF, GERD, and HTN. Pt admitted with enteritis and an ileus; Flu+ on admission.   11/03: NPO; CLD 11/04: NPO 11/06: NG tube placed for LIS 11/09 - s/p ex-lap with lysis of adhesions 11/11 - TPN start 11/12 - TPN increased to goal rate 11/13 - NG tube clamped, later removed 11/14 - clear liquids, full liquids 11/17 - full liquids, SOFT, full liquids 11/18 - SOFT, full liquids, SOFT 11/19 - Dysphagia 3; NGT to LIWS 11/20 - NPO 11/25 - NGT clamping trial 11/26 - diet advanced to CLD 11/28 - NGT unclamped and on LIWS due to distention 11/30 - diet advanced to FLD; calorie count started 12/01 - diet advanced to Soft 12/02 - NPO; diet advanced to clear liquids 12/05 - diet advanced to full liquids  Pt reports that he has not had any nausea or vomiting, that he is having bowel movements.   Pt requesting a boiled egg, discussed that he cannot have that on his current diet but would order it as soon as he can have it. Discussed item pt would like for lunch; RD to place lunch order for pt.   RD grabbed Boost Breeze  from nourishment room and gave to pt.   Per pharmacy note, TPN is at goal rate  Pt with no other questions or concerns at this time.   Medications reviewed and include: Dulcolax Labs reviewed.  Diet Order:   Diet Order             Diet full liquid Room service appropriate? Yes; Fluid consistency: Thin  Diet effective now                   EDUCATION NEEDS:   No education needs have been identified at this time  Skin:  Skin Assessment: Skin Integrity Issues: Skin Integrity Issues:: Incisions Incisions: Abdomen  Last BM:  09/03/2021  Height:   Ht Readings from Last 1 Encounters:  08/11/21 _0  (1.854 m)    Weight:   Wt Readings from Last 1 Encounters:  09/04/21 70.6 kg    Ideal Body Weight:  83.6 kg  BMI:  Body mass index is 20.53 kg/m.  Estimated Nutritional Needs:   Kcal:  2300-2500  Protein:  115-130 grams  Fluid:  > 2 L    Jocelyn Lowery BS, PLDN Clinical Dietitian See AMiON for contact information.

## 2021-09-04 NOTE — Progress Notes (Signed)
PHARMACY - TOTAL PARENTERAL NUTRITION CONSULT NOTE  Indication: Small bowel obstruction, prolonged ileus  Patient Measurements: Height: 6\' 1"  (185.4 cm) Weight: 70.6 kg (155 lb 10.3 oz) IBW/kg (Calculated) : 79.9 TPN AdjBW (KG): 70 Body mass index is 20.53 kg/m. Usual Weight: 74 kg  Assessment:  70 y.o. male with PMH significant for HTN, CHF with LVEF ~25%, GERD, BPH, prior history of alcoholism who presented with complaints of lower abdominal pain, found to have SBO. Patient has remained NPO/CLD, now for more than 7 days and at risk of becoming severely malnourished. Pharmacy is consulted to manage TPN.   Per RD's conversation with patient, reported he was eating well PTA. States he would have abdominal pain when eating. Per patient 11/15, he sometimes skips meals at home even though his wife is a good cook because he just doesn't feel like eating.  12/03 update: per chart review, patient remains distended and is still not able to tolerate PO. Patient is having small amounts of liquid stool intermittendly but no reliable BMs or significant flatus. Repeat CT abdomen and pelvis today with IV and PO contrast to further evaluate.   12/4: Remains with significant ileus and has failed medical management. No surgical intervention at this point time that is feasible or safe.  Glucose / Insulin: no hx DM, A1c 6.1% - CBGs mostly controlled on low end (hypoglycemic x 1 on 12/2), not on insulin, now improved and stable 74-156 Electrolytes: Na 134, K 4, Mag 2.0 (increased in TPN 12/1; goal >/=2 with ileus), phos 3.7, others stable wnl Renal: SCr < 1 stable, BUN 23 Hepatic: LFTs now wnl, tbili wnl, TG wnl, albumin 2.6, prealbumin wnl Intake / Output; MIVF: UOP down at 0.2 ml/kg/hr, NGT out - may need to replace if emesis, LBM 12/04 (not significant per chart review)  GI Imaging:  11/3 CT abd: SBO  11/4 Abd XR: small partial SBO 11/6 Abd XR: Persistent SBO  11/7 Abd XR: Persistent SBO  11/8 Abd  XR: SBO with interval worsening 11/9 Abd XR: post-op free air, SB dilatation 11/21 CT - no abscess/obstruction, consistent with ileus 11/23 Abd XR: incr gaseous distention, may reflect developing obstruction or ileus  11/25 Abd XR: contrast in R colon  12/3 CT A/P: diffuse dilation of the small bowel w/o transitional point GI Surgeries / Procedures:  11/9 ex-lap with LOA   Central access: PICC placed 08/10/21 TPN start date:  08/11/21  Nutritional Goals: Goal continuous TPN rate is 90 mL/hr (provides 119 g of protein and 2334 kcals per day)  RD Estimated Needs Total Energy Estimated Needs: 2300-2500 Total Protein Estimated Needs: 115-130 grams Total Fluid Estimated Needs: > 2 L  Current Nutrition:  Cyclic 12-hr TPN Ensure BID -   Back to NPO/CLD 12/2 with vomiting overnight and checking KUB, pSBO vs. ileus  Plan:   - Continue TPN at goal rate  - Cycle TPN over 12 hours (rate 98-196 ml/hr, GIR 3.91-7.82 mg/kg/min) - provides 119g AA, 345g CHO, 65g ILE, and 2293 kCal, meeting 100% of needs - Electrolytes in TPN: Na 100 mEq/L, K 30 mEq/L, Ca 5 mEq/L, Mg 8 mEq/L, Phos 5mmol/L; Cl:Ac 1:1 - Add daily MVI and trace elements to TPN  - Adjust to 4x daily cyclic TPN custom CBG checks for now, patient having some low values - watch hypoglycemia, may need to decrease to cycling at 14hrs  - Monitor TPN labs Mon/Thurs and PRN   Thank you for involving pharmacy in this patient's care.  Loura Back, PharmD, BCPS Clinical Pharmacist Clinical phone for 09/04/2021 until 3p is [phone] 09/04/2021 8:11 AM  **Pharmacist phone directory can be found on [email or web address] listed under [facility]**

## 2021-09-04 NOTE — Progress Notes (Signed)
Mobility Specialist Progress Note:   09/04/21 1200  Mobility  Activity Ambulated in hall;Ambulated to bathroom  Level of Assistance Modified independent, requires aide device or extra time  Assistive Device Front wheel walker  Distance Ambulated (ft) 600 ft  Mobility Ambulated independently in hallway;Out of bed for toileting;Sit up in bed/chair position for meals  Mobility Response Tolerated well  Mobility performed by Mobility specialist  Bed Position Chair  $Mobility charge 1 Mobility   Pt asx during ambulation. Pt to BR x2 during session to void. Up in chair with all needs met.   Nelta Numbers Mobility Specialist  Phone [phone]

## 2021-09-04 NOTE — Plan of Care (Signed)
  Problem: Clinical Measurements: Goal: Diagnostic test results will improve Outcome: Progressing   Problem: Clinical Measurements: Goal: Respiratory complications will improve Outcome: Progressing   Problem: Coping: Goal: Level of anxiety will decrease Outcome: Progressing   Problem: Nutrition: Goal: Adequate nutrition will be maintained Outcome: Progressing   

## 2021-09-04 NOTE — Progress Notes (Signed)
Progress Note  26 Days Post-Op  Subjective: Pt reports he is tolerating CLD this AM, he would like more to eat. He had a BM yesterday, passing flatus.  Objective: Vital signs in last 24 hours: Temp:  [98.3 F (36.8 C)-98.7 F (37.1 C)] 98.5 F (36.9 C) (12/05 0644) Pulse Rate:  [59-77] 59 (12/05 0644) Resp:  [17-20] 20 (12/05 0644) BP: (105-115)/(61-67) 105/66 (12/05 0644) SpO2:  [98 %-100 %] 98 % (12/05 0644) Weight:  [70.6 kg] 70.6 kg (12/05 0500) Last BM Date: 09/03/21 (Per patient and shift report. Not charted,)  Intake/Output from previous day: 12/04 0701 - 12/05 0700 In: 2010.4 [P.O.:120; I.V.:1890.4] Out: 400 [Urine:400] Intake/Output this shift: Total I/O In: 120 [P.O.:120] Out: 150 [Urine:150]  PE: General: pleasant, WD, WN male who is laying in bed in NAD Heart: regular, rate, and rhythm. Palpable radial and pedal pulses bilaterally Lungs: CTAB, no wheezes, rhonchi, or rales noted.  Respiratory effort nonlabored Abd: soft, NT, distended, BS hypoactive, incision well healed  MS: all 4 extremities are symmetrical with no cyanosis, clubbing, or edema. Skin: warm and dry with no masses, lesions, or rashes   Lab Results:  Recent Labs    09/02/21 0456 09/03/21 0502  WBC 5.0 3.4*  HGB 10.2* 10.1*  HCT 31.2* 30.4*  PLT 142* 133*   BMET Recent Labs    09/03/21 0502 09/04/21 0346  NA 135 134*  K 4.4 4.0  CL 103 104  CO2 27 26  GLUCOSE 99 101*  BUN 23 19  CREATININE 0.53* 0.51*  CALCIUM 8.4* 8.3*   PT/INR No results for input(s): LABPROT, INR in the last 72 hours. CMP     Component Value Date/Time   NA 134 (L) 09/04/2021 0346   NA 141 11/15/2017 1054   K 4.0 09/04/2021 0346   CL 104 09/04/2021 0346   CO2 26 09/04/2021 0346   GLUCOSE 101 (H) 09/04/2021 0346   BUN 19 09/04/2021 0346   BUN 13 11/15/2017 1054   CREATININE 0.51 (L) 09/04/2021 0346   CREATININE 0.84 09/28/2020 0956   CALCIUM 8.3 (L) 09/04/2021 0346   PROT 5.8 (L) 09/04/2021  0346   PROT 6.5 09/22/2015 0949   ALBUMIN 2.6 (L) 09/04/2021 0346   ALBUMIN 3.9 09/22/2015 0949   AST 15 09/04/2021 0346   ALT 23 09/04/2021 0346   ALKPHOS 43 09/04/2021 0346   BILITOT 0.4 09/04/2021 0346   BILITOT 0.8 09/22/2015 0949   GFRNONAA >60 09/04/2021 0346   GFRNONAA 90 09/28/2020 0956   GFRAA 104 09/28/2020 0956   Lipase     Component Value Date/Time   LIPASE 24 08/03/2021 1045       Studies/Results: CT ABDOMEN PELVIS W CONTRAST  Result Date: 09/02/2021 CLINICAL DATA:  Abdominal distension. EXAM: CT ABDOMEN AND PELVIS WITH CONTRAST TECHNIQUE: Multidetector CT imaging of the abdomen and pelvis was performed using the standard protocol following bolus administration of intravenous contrast. CONTRAST:  OMNIPAQUE IOHEXOL 350 MG/ML SOLN COMPARISON:  August 21, 2021 FINDINGS: Lower chest: Partially visualized scarring versus atelectasis in the lung bases. Hepatobiliary: Redemonstrated are multiple liver cysts. Small amount of layering sludge in the gallbladder. No CT evidence of acute cholecystitis. Pancreas: Unremarkable. No pancreatic ductal dilatation or surrounding inflammatory changes. Spleen: Normal in size without focal abnormality. Adrenals/Urinary Tract: Adrenal glands are unremarkable. Kidneys are normal, without renal calculi, focal lesion, or hydronephrosis. Renal cyst noted. Bladder is decompressed and therefore poorly visualized. Stomach/Bowel: Persistent marked gas and fluid distension of small bowel  loops throughout the abdomen without identifiable transitional point. The findings are stable to worsened from the last study dated November 21st 2022. The previously noted free gas in the right upper quadrant has improved. Vascular/Lymphatic: No significant vascular findings are present. No enlarged abdominal or pelvic lymph nodes. Reproductive: Prostate is unremarkable. Other: No abdominal wall hernia or abnormality. Stable postsurgical changes. Musculoskeletal: No  acute or significant osseous findings. IMPRESSION: 1. Persistent marked gas and fluid distension of small bowel loops throughout the abdomen without identifiable transitional point. The findings are stable to worsened from the last study dated November 21st 2022. 2. Interval improvement in the previously noted free gas in the right upper quadrant. 3. Stable liver and renal cysts. 4. Small amount of layering sludge in the gallbladder. No CT evidence of acute cholecystitis. Electronically Signed   By: Ted Mcalpine M.D.   On: 09/02/2021 19:22    Anti-infectives: Anti-infectives (From admission, onward)    Start     Dose/Rate Route Frequency Ordered Stop   08/09/21 1000  ceFAZolin (ANCEF) IVPB 2g/100 mL premix  Status:  Discontinued        2 g 200 mL/hr over 30 Minutes Intravenous To Short Stay 08/09/21 0758 08/10/21 1000   08/04/21 2200  oseltamivir (TAMIFLU) capsule 30 mg        30 mg Oral 2 times daily 08/04/21 0822 08/09/21 0959   08/04/21 1000  oseltamivir (TAMIFLU) capsule 75 mg        75 mg Oral  Once 08/04/21 0825 08/04/21 1116   08/03/21 1645  cefTRIAXone (ROCEPHIN) 2 g in sodium chloride 0.9 % 100 mL IVPB  Status:  Discontinued        2 g 200 mL/hr over 30 Minutes Intravenous Every 24 hours 08/03/21 1631 08/06/21 1519   08/03/21 1645  metroNIDAZOLE (FLAGYL) IVPB 500 mg  Status:  Discontinued        500 mg 100 mL/hr over 60 Minutes Intravenous Every 12 hours 08/03/21 1631 08/06/21 1519        Assessment/Plan SBO POD 26 s/p ex lap LOA by Dr. Janee Morn 11/9 with findings of dense adhesion near distal ileum down to the mesentery - afebrile, VSS - NGT initially removed 11/13 but replaced 11/19 due to persistent ileus w/ intolerance of PO.  - CT repeated 11/21 without abscess or definite transition zone, dilated loops of small bowel with air fluid levels consistent with ileus. - SBO protocol started 11/23; KUB 11/25 showed contrast in Rt colon, KUB 11/27 w/ contrast in rectum but  persistently dilated small bowel . - NG removed again 11/30, had vomiting 12/1. Backed off to clears 12/2 .  - CT A/P repeated 12/3 - ongoing diffuse dilation of the small bowel without transition zone. This is consistent with ileus, not obstruction. If patient has emesis would place NG tube. Continue full support TPN. - mobilize/OOB      FEN - CLD, TPN, try adding ensure today ID - ancef periop VTE - SCDs, lovenox Foley - placed periop 11/9, d/c 11/10   AKI - resolved HTN HLD COPD CHF   LOS: 31 days    Juliet Rude, [facility] 09/04/2021, 7:53 AM Please see Amion for pager number during day hours 7:00am-4:30pm

## 2021-09-04 NOTE — Progress Notes (Signed)
Mobility Specialist Progress Note:   09/04/21 0945  Mobility  Activity Ambulated to bathroom  Level of Assistance Modified independent, requires aide device or extra time  Assistive Device Front wheel walker  Distance Ambulated (ft) 30 ft  Mobility Out of bed for toileting  Mobility Response Tolerated well  Mobility performed by Mobility specialist  $Mobility charge 1 Mobility   Pt ambulated to BR to void. BM successful. Pt back in bed with bed alarm on.   Addison Lank Mobility Specialist  Phone [phone]

## 2021-09-04 NOTE — Progress Notes (Signed)
PROGRESS NOTE        PATIENT DETAILS Name: [name] Age: 70 y.o. Sex: male Date of Birth: 1950-11-14 Admit Date: 08/03/2021 Admitting Physician Darlin Drop, DO VEH:MCNOBSJ, Janene Harvey, NP  Brief Narrative: Patient is a 70 y.o. male with history of HFrEF, EtOH use-who presented with SBO-subsequently underwent exploratory laparotomy on 11/9-further hospital course complicated by prolonged ileus.  See below for further details.  Subjective:   He remains in bed in no distress denies any headache chest or abdominal pain, has passed some flatus and a small liquid stool but still feels slightly distended.  No nausea.   Objective:  Vitals: Blood pressure 105/66, pulse (!) 59, temperature 98.5 F (36.9 C), temperature source Oral, resp. rate 20, height 6\' 1"  (1.854 m), weight 70.6 kg, SpO2 98 %.   Exam:  Awake Alert, No new F.N deficits, Normal affect [place].AT,PERRAL Supple Neck, No JVD,   Symmetrical Chest wall movement, Good air movement bilaterally, CTAB RRR,No Gallops, Rubs or new Murmurs,  +ve B.Sounds, abdomen is mildly distended but nontender No Cyanosis, Clubbing or edema    Assessment/Plan:  SBO-s/p exploratory laparotomy on 11/9-postop course complicated by prolonged ileus: General surgery following-NG tube DC'd on 11/30-on lcears, still minimal to no bowel activity, defer management to CCS - on TNA + clears.  HFrEF (EF 20% on TTE in 2017): Volume status stable/compensated.  Does not require diuretics.  Influenza A: Completed course of Tamiflu-he appears asymptomatic.  HTN: BP stable-resume lisinopril when oral intake resumes.  HLD: PO Crestor.  CAD: No anginal symptoms-PO ASA.  Nutrition Status: Nutrition Problem: Severe Malnutrition Etiology: chronic illness (CHF) Signs/Symptoms: severe muscle depletion, severe fat depletion Interventions: Boost Breeze, TPN  BMI Estimated body mass index is 20.53 kg/m as calculated from the  following:   Height as of this encounter: 6\' 1"  (1.854 m).   Weight as of this encounter: 70.6 kg.    Procedures: None Consults: CCS DVT Prophylaxis: Lovenox Code Status:Full code  Family Communication: None at bedside  Time spent: 15- minutes-Greater than 50% of this time was spent in counseling, explanation of diagnosis, planning of further management, and coordination of care.   Disposition Plan: Status is: Inpatient  Remains inpatient appropriate because: SBO-s/p laparotomy with prolonged ileus-on TPN-NG tube still in place.   Diet: Diet Order             Diet clear liquid Room service appropriate? Yes; Fluid consistency: Thin  Diet effective now                     Antimicrobial agents: Anti-infectives (From admission, onward)    Start     Dose/Rate Route Frequency Ordered Stop   08/09/21 1000  ceFAZolin (ANCEF) IVPB 2g/100 mL premix  Status:  Discontinued        2 g 200 mL/hr over 30 Minutes Intravenous To Short Stay 08/09/21 0758 08/10/21 1000   08/04/21 2200  oseltamivir (TAMIFLU) capsule 30 mg        30 mg Oral 2 times daily 08/04/21 0822 08/09/21 0959   08/04/21 1000  oseltamivir (TAMIFLU) capsule 75 mg        75 mg Oral  Once 08/04/21 0825 08/04/21 1116   08/03/21 1645  cefTRIAXone (ROCEPHIN) 2 g in sodium chloride 0.9 % 100 mL IVPB  Status:  Discontinued  2 g 200 mL/hr over 30 Minutes Intravenous Every 24 hours 08/03/21 1631 08/06/21 1519   08/03/21 1645  metroNIDAZOLE (FLAGYL) IVPB 500 mg  Status:  Discontinued        500 mg 100 mL/hr over 60 Minutes Intravenous Every 12 hours 08/03/21 1631 08/06/21 1519        MEDICATIONS: Scheduled Meds:  aspirin  81 mg Oral Daily   bisacodyl  10 mg Rectal Daily   Chlorhexidine Gluconate Cloth  6 each Topical Daily   enoxaparin (LOVENOX) injection  40 mg Subcutaneous Q24H   feeding supplement  1 Container Oral TID BM   feeding supplement  237 mL Oral BID BM   lip balm  1 application Topical BID    mouth rinse  15 mL Mouth Rinse BID   rosuvastatin  5 mg Oral Daily   Continuous Infusions:  TPN CYCLIC-ADULT (ION)      PRN Meds:.acetaminophen, albuterol, alum & mag hydroxide-simeth, bisacodyl, enalaprilat, hydrALAZINE, magic mouthwash, menthol-cetylpyridinium, metoprolol tartrate, morphine injection, [DISCONTINUED] ondansetron **OR** ondansetron (ZOFRAN) IV, phenol, prochlorperazine, simethicone   I have personally reviewed following labs and imaging studies  LABORATORY DATA:  Recent Labs  Lab 09/02/21 0456 09/03/21 0502  WBC 5.0 3.4*  HGB 10.2* 10.1*  HCT 31.2* 30.4*  PLT 142* 133*  MCV 98.4 98.1  MCH 32.2 32.6  MCHC 32.7 33.2  RDW 12.8 12.8    Recent Labs  Lab 08/30/21 0559 08/31/21 0453 09/02/21 0456 09/03/21 0502 09/04/21 0346  NA 135 137 137 135 134*  K 4.0 4.1 4.2 4.4 4.0  CL 103 105 105 103 104  CO2 27 26 27 27 26   GLUCOSE 129* 131* 124* 99 101*  BUN 22 25* 22 23 19   CREATININE 0.47* 0.55* 0.54* 0.53* 0.51*  CALCIUM 8.5* 8.6* 8.5* 8.4* 8.3*  AST  --  32  --  17 15  ALT  --  57*  --  28 23  ALKPHOS  --  51  --  47 43  BILITOT  --  0.4  --  0.4 0.4  ALBUMIN  --  2.7*  --  2.6* 2.6*  MG  --  1.9 1.9 2.0 2.0     RADIOLOGY STUDIES/RESULTS: CT ABDOMEN PELVIS W CONTRAST  Result Date: 09/02/2021 CLINICAL DATA:  Abdominal distension. EXAM: CT ABDOMEN AND PELVIS WITH CONTRAST TECHNIQUE: Multidetector CT imaging of the abdomen and pelvis was performed using the standard protocol following bolus administration of intravenous contrast. CONTRAST:  OMNIPAQUE IOHEXOL 350 MG/ML SOLN COMPARISON:  August 21, 2021 FINDINGS: Lower chest: Partially visualized scarring versus atelectasis in the lung bases. Hepatobiliary: Redemonstrated are multiple liver cysts. Small amount of layering sludge in the gallbladder. No CT evidence of acute cholecystitis. Pancreas: Unremarkable. No pancreatic ductal dilatation or surrounding inflammatory changes. Spleen: Normal in size  without focal abnormality. Adrenals/Urinary Tract: Adrenal glands are unremarkable. Kidneys are normal, without renal calculi, focal lesion, or hydronephrosis. Renal cyst noted. Bladder is decompressed and therefore poorly visualized. Stomach/Bowel: Persistent marked gas and fluid distension of small bowel loops throughout the abdomen without identifiable transitional point. The findings are stable to worsened from the last study dated November 21st 2022. The previously noted free gas in the right upper quadrant has improved. Vascular/Lymphatic: No significant vascular findings are present. No enlarged abdominal or pelvic lymph nodes. Reproductive: Prostate is unremarkable. Other: No abdominal wall hernia or abnormality. Stable postsurgical changes. Musculoskeletal: No acute or significant osseous findings. IMPRESSION: 1. Persistent marked gas and fluid distension of  small bowel loops throughout the abdomen without identifiable transitional point. The findings are stable to worsened from the last study dated November 21st 2022. 2. Interval improvement in the previously noted free gas in the right upper quadrant. 3. Stable liver and renal cysts. 4. Small amount of layering sludge in the gallbladder. No CT evidence of acute cholecystitis. Electronically Signed   By: Ted Mcalpine M.D.   On: 09/02/2021 19:22     LOS: 31 days   Signature  Susa Raring M.D on 09/04/2021 at 9:06 AM   -  To page go to [email or web address]

## 2021-09-05 ENCOUNTER — Ambulatory Visit: Payer: Medicare HMO | Admitting: Orthopedic Surgery

## 2021-09-05 ENCOUNTER — Inpatient Hospital Stay (HOSPITAL_COMMUNITY): Payer: Medicare HMO

## 2021-09-05 DIAGNOSIS — K56609 Unspecified intestinal obstruction, unspecified as to partial versus complete obstruction: Secondary | ICD-10-CM | POA: Diagnosis not present

## 2021-09-05 LAB — COMPREHENSIVE METABOLIC PANEL
ALT: 19 U/L (ref 0–44)
AST: 14 U/L — ABNORMAL LOW (ref 15–41)
Albumin: 2.6 g/dL — ABNORMAL LOW (ref 3.5–5.0)
Alkaline Phosphatase: 44 U/L (ref 38–126)
Anion gap: 6 (ref 5–15)
BUN: 20 mg/dL (ref 8–23)
CO2: 26 mmol/L (ref 22–32)
Calcium: 8.5 mg/dL — ABNORMAL LOW (ref 8.9–10.3)
Chloride: 103 mmol/L (ref 98–111)
Creatinine, Ser: 0.53 mg/dL — ABNORMAL LOW (ref 0.61–1.24)
GFR, Estimated: >60 mL/min (ref >60)
Glucose, Bld: 99 mg/dL (ref 70–99)
Potassium: 4.3 mmol/L (ref 3.5–5.1)
Sodium: 135 mmol/L (ref 135–145)
Total Bilirubin: 0.3 mg/dL (ref 0.3–1.2)
Total Protein: 5.5 g/dL — ABNORMAL LOW (ref 6.5–8.1)

## 2021-09-05 LAB — CBC
HCT: 30.9 % — ABNORMAL LOW (ref 39.0–52.0)
Hemoglobin: 10 g/dL — ABNORMAL LOW (ref 13.0–17.0)
MCH: 31.8 pg (ref 26.0–34.0)
MCHC: 32.4 g/dL (ref 30.0–36.0)
MCV: 98.4 fL (ref 80.0–100.0)
Platelets: 164 10*3/uL (ref 150–400)
RBC: 3.14 MIL/uL — ABNORMAL LOW (ref 4.22–5.81)
RDW: 13 % (ref 11.5–15.5)
WBC: 3.2 10*3/uL — ABNORMAL LOW (ref 4.0–10.5)
nRBC: 0 % (ref 0.0–0.2)

## 2021-09-05 LAB — GLUCOSE, CAPILLARY
Glucose-Capillary: 126 mg/dL — ABNORMAL HIGH (ref 70–99)
Glucose-Capillary: 134 mg/dL — ABNORMAL HIGH (ref 70–99)
Glucose-Capillary: 164 mg/dL — ABNORMAL HIGH (ref 70–99)
Glucose-Capillary: 76 mg/dL (ref 70–99)
Glucose-Capillary: 95 mg/dL (ref 70–99)

## 2021-09-05 MED ORDER — TRAVASOL 10 % IV SOLN
INTRAVENOUS | Status: DC
  Filled 2021-09-05: qty 592.9

## 2021-09-05 MED ORDER — CALCIUM POLYCARBOPHIL 625 MG PO TABS
625.0000 mg | ORAL_TABLET | Freq: Every day | ORAL | Status: DC
  Administered 2021-09-05 – 2021-09-08 (×4): 625 mg via ORAL
  Filled 2021-09-05 (×4): qty 1

## 2021-09-05 NOTE — Progress Notes (Addendum)
Progress Note  27 Days Post-Op  Subjective: Pt denies abdominal pain this AM. Denies nausea or vomiting. He is having bowel function.   Objective: Vital signs in last 24 hours: Temp:  [97.8 F (36.6 C)-98.5 F (36.9 C)] 97.9 F (36.6 C) (12/06 0908) Pulse Rate:  [60-70] 65 (12/06 0908) Resp:  [14-18] 14 (12/06 0908) BP: (104-116)/(69-80) 104/80 (12/06 0908) SpO2:  [100 %] 100 % (12/06 0908) Last BM Date: 09/05/21  Intake/Output from previous day: 12/05 0701 - 12/06 0700 In: 2169.8 [P.O.:240; I.V.:1929.8] Out: 850 [Urine:850] Intake/Output this shift: Total I/O In: 100 [P.O.:100] Out: 350 [Urine:350]  PE: General: pleasant, WD, WN male who is laying in bed in NAD Heart: regular, rate, and rhythm. Palpable radial and pedal pulses bilaterally Lungs: CTAB, no wheezes, rhonchi, or rales noted.  Respiratory effort nonlabored Abd: soft, NT, distended, BS hypoactive, incision well healed  MS: all 4 extremities are symmetrical with no cyanosis, clubbing, or edema. Skin: warm and dry with no masses, lesions, or rashes   Lab Results:  Recent Labs    09/03/21 0502 09/05/21 0448  WBC 3.4* 3.2*  HGB 10.1* 10.0*  HCT 30.4* 30.9*  PLT 133* 164   BMET Recent Labs    09/04/21 0346 09/05/21 0448  NA 134* 135  K 4.0 4.3  CL 104 103  CO2 26 26  GLUCOSE 101* 99  BUN 19 20  CREATININE 0.51* 0.53*  CALCIUM 8.3* 8.5*   PT/INR No results for input(s): LABPROT, INR in the last 72 hours. CMP     Component Value Date/Time   NA 135 09/05/2021 0448   NA 141 11/15/2017 1054   K 4.3 09/05/2021 0448   CL 103 09/05/2021 0448   CO2 26 09/05/2021 0448   GLUCOSE 99 09/05/2021 0448   BUN 20 09/05/2021 0448   BUN 13 11/15/2017 1054   CREATININE 0.53 (L) 09/05/2021 0448   CREATININE 0.84 09/28/2020 0956   CALCIUM 8.5 (L) 09/05/2021 0448   PROT 5.5 (L) 09/05/2021 0448   PROT 6.5 09/22/2015 0949   ALBUMIN 2.6 (L) 09/05/2021 0448   ALBUMIN 3.9 09/22/2015 0949   AST 14 (L)  09/05/2021 0448   ALT 19 09/05/2021 0448   ALKPHOS 44 09/05/2021 0448   BILITOT 0.3 09/05/2021 0448   BILITOT 0.8 09/22/2015 0949   GFRNONAA >60 09/05/2021 0448   GFRNONAA 90 09/28/2020 0956   GFRAA 104 09/28/2020 0956   Lipase     Component Value Date/Time   LIPASE 24 08/03/2021 1045       Studies/Results: No results found.  Anti-infectives: Anti-infectives (From admission, onward)    Start     Dose/Rate Route Frequency Ordered Stop   08/09/21 1000  ceFAZolin (ANCEF) IVPB 2g/100 mL premix  Status:  Discontinued        2 g 200 mL/hr over 30 Minutes Intravenous To Short Stay 08/09/21 0758 08/10/21 1000   08/04/21 2200  oseltamivir (TAMIFLU) capsule 30 mg        30 mg Oral 2 times daily 08/04/21 0822 08/09/21 0959   08/04/21 1000  oseltamivir (TAMIFLU) capsule 75 mg        75 mg Oral  Once 08/04/21 0825 08/04/21 1116   08/03/21 1645  cefTRIAXone (ROCEPHIN) 2 g in sodium chloride 0.9 % 100 mL IVPB  Status:  Discontinued        2 g 200 mL/hr over 30 Minutes Intravenous Every 24 hours 08/03/21 1631 08/06/21 1519   08/03/21 1645  metroNIDAZOLE (FLAGYL)  IVPB 500 mg  Status:  Discontinued        500 mg 100 mL/hr over 60 Minutes Intravenous Every 12 hours 08/03/21 1631 08/06/21 1519        Assessment/Plan SBO POD 27 s/p ex lap LOA by Dr. Janee Morn 11/9 with findings of dense adhesion near distal ileum down to the mesentery - afebrile, VSS - NGT initially removed 11/13 but replaced 11/19 due to persistent ileus w/ intolerance of PO.  - CT repeated 11/21 without abscess or definite transition zone, dilated loops of small bowel with air fluid levels consistent with ileus. - SBO protocol started 11/23; KUB 11/25 showed contrast in Rt colon, KUB 11/27 w/ contrast in rectum but persistently dilated small bowel . - NG removed again 11/30, had vomiting 12/1. Backed off to clears 12/2 .  - CT A/P repeated 12/3 - ongoing diffuse dilation of the small bowel without transition zone. This  is consistent with ileus, not obstruction. If patient has emesis would place NG tube. Continue full support TPN. - mobilize/OOB  - advanced to FLD 12/5, check KUB this AM     FEN - FLD, ensure which pt does not want, TPN ID - ancef periop VTE - SCDs, lovenox Foley - placed periop 11/9, d/c 11/10   AKI - resolved HTN HLD COPD CHF   Addendum 1023 - KUB with what appears to be air in colon and clinically pt progressing. Discussed with attending provider and will try patient on soft diet with fiber supplement. Ok to start weaning TPN. Orders placed for this.   LOS: 32 days    Juliet Rude, [facility] 09/05/2021, 10:03 AM Please see Amion for pager number during day hours 7:00am-4:30pm

## 2021-09-05 NOTE — Progress Notes (Signed)
Mobility Specialist Progress Note:   09/05/21 1420  Mobility  Activity Ambulated in hall  Level of Assistance Modified independent, requires aide device or extra time  Assistive Device Front wheel walker  Distance Ambulated (ft) 600 ft  Mobility Ambulated independently in hallway  Mobility Response Tolerated well  Mobility performed by Mobility specialist  $Mobility charge 1 Mobility   Pt with R ankle pain during ambulation. Noticed swelling in RLE. Pt otherwise asx during amb. Pt back in bed with all needs met.   Nelta Numbers Mobility Specialist  Phone [phone]

## 2021-09-05 NOTE — Progress Notes (Signed)
Mobility Specialist Progress Note:   09/05/21 0950  Mobility  Activity Ambulated in hall;Ambulated to bathroom  Level of Assistance Modified independent, requires aide device or extra time  Assistive Device Front wheel walker  Distance Ambulated (ft) 570 ft  Mobility Ambulated independently in hallway;Out of bed for toileting  Mobility Response Tolerated well  Mobility performed by Mobility specialist  Bed Position Chair  $Mobility charge 1 Mobility   Pt asx during ambulation. Xray in to take picture. Pt back in chair with all needs met.   Nelta Numbers Mobility Specialist  Phone [phone]

## 2021-09-05 NOTE — Progress Notes (Signed)
PROGRESS NOTE        PATIENT DETAILS Name: [name] Age: 70 y.o. Sex: male Date of Birth: [birth year] Admit Date: 08/03/2021 Admitting Physician Darlin Drop, DO WGN:[id number], Janene Harvey, NP  Brief Narrative: Patient is a 70 y.o. male with history of HFrEF, EtOH use-who presented with SBO-subsequently underwent exploratory laparotomy on 11/9-further hospital course complicated by prolonged ileus.  See below for further details.  Subjective: Patient in bed appears to be in no distress, no nausea vomiting, no chest pain or abdominal pain, today had a small bowel movement.  Passing some flatus   Objective:  Vitals: Blood pressure 104/80, pulse 65, temperature 97.9 F (36.6 C), temperature source Oral, resp. rate 14, height 6\' 1"  (1.854 m), weight 70.6 kg, SpO2 100 %.   Exam:  Awake Alert, No new F.N deficits, Normal affect Anderson.AT,PERRAL Supple Neck, No JVD,   Symmetrical Chest wall movement, Good air movement bilaterally, CTAB RRR,No Gallops, Rubs or new Murmurs,  +ve B.Sounds, Abd less distended today nontender No Cyanosis, Clubbing or edema     Assessment/Plan:  SBO due to previous abdominal radiation - s/p exploratory laparotomy on 11/9-postop course complicated by prolonged ileus: General surgery following-NG tube DC'd on 11/30-on lcears, is passing some flatus and had a small bowel movement on 09/05/2021, defer management to [facility] - on TNA + clears.  HFrEF (EF 20% on TTE in 2017): Volume status stable/compensated.  Does not require diuretics.  Influenza A: Completed course of Tamiflu-he appears asymptomatic.  HTN: BP stable-resume lisinopril when oral intake resumes.  HLD: PO Crestor.  CAD: No anginal symptoms-PO ASA.  Nutrition Status: Nutrition Problem: Severe Malnutrition Etiology: chronic illness (CHF) Signs/Symptoms: severe muscle depletion, severe fat depletion Interventions: Boost Breeze, TPN  BMI Estimated body mass index is  20.53 kg/m as calculated from the following:   Height as of this encounter: 6\' 1"  (1.854 m).   Weight as of this encounter: 70.6 kg.    Procedures: None Consults: [facility] DVT Prophylaxis: Lovenox Code Status:Full code  Family Communication: None at bedside  Time spent: 15- minutes-Greater than 50% of this time was spent in counseling, explanation of diagnosis, planning of further management, and coordination of care.   Disposition Plan: Status is: Inpatient  Remains inpatient appropriate because: SBO-s/p laparotomy with prolonged ileus-on TPN-NG tube still in place.   Diet: Diet Order             Diet full liquid Room service appropriate? Yes; Fluid consistency: Thin  Diet effective now                     Antimicrobial agents: Anti-infectives (From admission, onward)    Start     Dose/Rate Route Frequency Ordered Stop   08/09/21 1000  ceFAZolin (ANCEF) IVPB 2g/100 mL premix  Status:  Discontinued        2 g 200 mL/hr over 30 Minutes Intravenous To Short Stay 08/09/21 0758 08/10/21 1000   08/04/21 2200  oseltamivir (TAMIFLU) capsule 30 mg        30 mg Oral 2 times daily 08/04/21 0822 08/09/21 0959   08/04/21 1000  oseltamivir (TAMIFLU) capsule 75 mg        75 mg Oral  Once 08/04/21 0825 08/04/21 1116   08/03/21 1645  cefTRIAXone (ROCEPHIN) 2 g in sodium chloride 0.9 % 100 mL IVPB  Status:  Discontinued        2 g 200 mL/hr over 30 Minutes Intravenous Every 24 hours 08/03/21 1631 08/06/21 1519   08/03/21 1645  metroNIDAZOLE (FLAGYL) IVPB 500 mg  Status:  Discontinued        500 mg 100 mL/hr over 60 Minutes Intravenous Every 12 hours 08/03/21 1631 08/06/21 1519        MEDICATIONS: Scheduled Meds:  aspirin  81 mg Oral Daily   Chlorhexidine Gluconate Cloth  6 each Topical Daily   enoxaparin (LOVENOX) injection  40 mg Subcutaneous Q24H   feeding supplement  1 Container Oral TID BM   lip balm  1 application Topical BID   mouth rinse  15 mL Mouth Rinse BID    rosuvastatin  5 mg Oral Daily   Continuous Infusions:    PRN Meds:.acetaminophen, albuterol, alum & mag hydroxide-simeth, bisacodyl, enalaprilat, hydrALAZINE, magic mouthwash, menthol-cetylpyridinium, metoprolol tartrate, morphine injection, [DISCONTINUED] ondansetron **OR** ondansetron (ZOFRAN) IV, phenol, prochlorperazine, simethicone   I have personally reviewed following labs and imaging studies  LABORATORY DATA:  Recent Labs  Lab 09/02/21 0456 09/03/21 0502 09/05/21 0448  WBC 5.0 3.4* 3.2*  HGB 10.2* 10.1* 10.0*  HCT 31.2* 30.4* 30.9*  PLT 142* 133* 164  MCV 98.4 98.1 98.4  MCH 32.2 32.6 31.8  MCHC 32.7 33.2 32.4  RDW 12.8 12.8 13.0    Recent Labs  Lab 08/31/21 0453 09/02/21 0456 09/03/21 0502 09/04/21 0346 09/05/21 0448  NA 137 137 135 134* 135  K 4.1 4.2 4.4 4.0 4.3  CL 105 105 103 104 103  CO2 26 27 27 26 26   GLUCOSE 131* 124* 99 101* 99  BUN 25* 22 23 19 20   CREATININE 0.55* 0.54* 0.53* 0.51* 0.53*  CALCIUM 8.6* 8.5* 8.4* 8.3* 8.5*  AST 32  --  17 15 14*  ALT 57*  --  28 23 19   ALKPHOS 51  --  47 43 44  BILITOT 0.4  --  0.4 0.4 0.3  ALBUMIN 2.7*  --  2.6* 2.6* 2.6*  MG 1.9 1.9 2.0 2.0  --      RADIOLOGY STUDIES/RESULTS: No results found.   LOS: 32 days   Signature  M.D on 09/05/2021 at 10:08 AM   -  To page go to [email or web address]

## 2021-09-05 NOTE — Progress Notes (Signed)
Mobility Specialist Progress Note:   09/05/21 1230  Mobility  Activity Ambulated in hall;Ambulated to bathroom  Level of Assistance Modified independent, requires aide device or extra time  Assistive Device Front wheel walker  Distance Ambulated (ft) 570 ft  Mobility Ambulated independently in hallway;Out of bed for toileting;Sit up in bed/chair position for meals  Mobility Response Tolerated well  Mobility performed by Mobility specialist  Bed Position Chair  $Mobility charge 1 Mobility   Pt to BR to void before ambulation. Asx ambulation. Pt back in chair eating lunch.  Addison Lank Mobility Specialist  Phone [phone]

## 2021-09-05 NOTE — Progress Notes (Signed)
PHARMACY - TOTAL PARENTERAL NUTRITION CONSULT NOTE  Indication: Small bowel obstruction, prolonged ileus  Patient Measurements: Height: 6\' 1"  (185.4 cm) Weight: 70.6 kg (155 lb 10.3 oz) IBW/kg (Calculated) : 79.9 TPN AdjBW (KG): 70 Body mass index is 20.53 kg/m. Usual Weight: 74 kg  Assessment:  70 y.o. male with PMH significant for HTN, CHF with LVEF ~25%, GERD, BPH, prior history of alcoholism who presented with complaints of lower abdominal pain, found to have SBO. Patient has remained NPO/CLD, now for more than 7 days and at risk of becoming severely malnourished. Pharmacy is consulted to manage TPN.   Per RD's conversation with patient, reported he was eating well PTA. States he would have abdominal pain when eating. Per patient 11/15, he sometimes skips meals at home even though his wife is a good cook because he just doesn't feel like eating.  12/03 update: per chart review, patient remains distended and is still not able to tolerate PO. Patient is having small amounts of liquid stool intermittendly but no reliable BMs or significant flatus. Repeat CT abdomen and pelvis today with IV and PO contrast to further evaluate.   12/4: Remains with significant ileus and has failed medical management. No surgical intervention at this point time that is feasible or safe.  Glucose / Insulin: no hx DM, A1c 6.1% - CBGs mostly controlled, can be low in afternoon while off TPN (hypoglycemic x 1 on 12/2 and 12/5), not on insulin Electrolytes: Na 134, K 4, Mag 2.0 (increased in TPN 12/1; goal >/=2 with ileus), phos 3.7, others stable wnl Renal: SCr < 1 stable, BUN 20 Hepatic: LFTs now wnl, tbili wnl, TG wnl, albumin 2.6, prealbumin wnl Intake / Output; MIVF: UOP 0.5 ml/kg/hr, NGT out - may need to replace if emesis, LBM 12/04  GI Imaging:  11/3 CT abd: SBO  11/4 Abd XR: small partial SBO 11/6 Abd XR: Persistent SBO  11/7 Abd XR: Persistent SBO  11/8 Abd XR: SBO with interval worsening 11/9  Abd XR: post-op free air, SB dilatation 11/21 CT - no abscess/obstruction, consistent with ileus 11/23 Abd XR: incr gaseous distention, may reflect developing obstruction or ileus  11/25 Abd XR: contrast in R colon  12/3 CT A/P: diffuse dilation of the small bowel w/o transitional point 12/6 Abd XR: slightly improved ileus GI Surgeries / Procedures:  11/9 ex-lap with LOA   Central access: PICC placed 08/10/21 TPN start date:  08/11/21  Nutritional Goals: Goal continuous TPN rate is 90 mL/hr (provides 119 g of protein and 2334 kcals per day)  RD Estimated Needs Total Energy Estimated Needs: 2300-2500 Total Protein Estimated Needs: 115-130 grams Total Fluid Estimated Needs: > 2 L  Current Nutrition:  Cyclic 12-hr TPN Boost Breeze TID - 3 charted as given 12/5 full liquids  Plan:   - Decrease TPN to 1/2 goal rate per Surgery - Cycle TPN over 14 hours due to hypoglycemia (rate 41-83 ml/hr, GIR 3.14-6.27 mg/kg/min) - provides 59g AA, 172g CHO, 32g ILE, and 1147kCal, meeting 50% of needs - Electrolytes in TPN: Na 100 mEq/L, K 30 mEq/L, Ca 5 mEq/L, Mg 8 mEq/L, Phos 58mmol/L; Cl:Ac 1:1 - Add daily MVI and trace elements to TPN  - Adjust to 4x daily cyclic TPN custom CBG checks for now, patient having some low values - watch hypoglycemia, f/u need to extend cycle longer than 14 hours - Monitor TPN labs Mon/Thurs and PRN - F/U soft diet tolerability and ability to wean TPN   Thank you  for involving pharmacy in this patient's care.  Loura Back, PharmD, BCPS Clinical Pharmacist Clinical phone for 09/05/2021 until 3p is [phone] 09/05/2021 7:15 AM  **Pharmacist phone directory can be found on [email or web address] listed under [facility]**

## 2021-09-06 LAB — CBC
HCT: 31.6 % — ABNORMAL LOW (ref 39.0–52.0)
Hemoglobin: 10.6 g/dL — ABNORMAL LOW (ref 13.0–17.0)
MCH: 32.5 pg (ref 26.0–34.0)
MCHC: 33.5 g/dL (ref 30.0–36.0)
MCV: 96.9 fL (ref 80.0–100.0)
Platelets: 201 10*3/uL (ref 150–400)
RBC: 3.26 MIL/uL — ABNORMAL LOW (ref 4.22–5.81)
RDW: 13 % (ref 11.5–15.5)
WBC: 3 10*3/uL — ABNORMAL LOW (ref 4.0–10.5)
nRBC: 0 % (ref 0.0–0.2)

## 2021-09-06 LAB — GLUCOSE, CAPILLARY
Glucose-Capillary: 96 mg/dL (ref 70–99)
Glucose-Capillary: 70 mg/dL (ref 70–99)
Glucose-Capillary: 76 mg/dL (ref 70–99)

## 2021-09-06 NOTE — Progress Notes (Signed)
Mobility Specialist Progress Note:   09/06/21 0900  Mobility  Activity Ambulated in hall;Ambulated to bathroom  Level of Assistance Modified independent, requires aide device or extra time  Assistive Device Front wheel walker  Distance Ambulated (ft) 590 ft  Mobility Out of bed for toileting;Sit up in bed/chair position for meals;Ambulated independently in hallway  Mobility Response Tolerated well  Mobility performed by Mobility specialist  Bed Position Chair  $Mobility charge 1 Mobility   Pt asx during ambulation. R ankle still swollen, less painful today. Pt to BR to void x2 during session. Sitting up in chair with all needs met.   Nelta Numbers Mobility Specialist  Phone [phone]

## 2021-09-06 NOTE — Discharge Instructions (Addendum)
[facility]      [facility] °[phone] ° °OPEN ABDOMINAL SURGERY: POST OP INSTRUCTIONS ° °Always review your discharge instruction sheet given to you by the facility where your surgery was performed. ° °IF YOU HAVE DISABILITY OR FAMILY LEAVE FORMS, YOU MUST BRING THEM TO THE OFFICE FOR PROCESSING.  PLEASE DO NOT GIVE THEM TO YOUR DOCTOR. ° °A prescription for pain medication may be given to you upon discharge.  Take your pain medication as prescribed, if needed.  If narcotic pain medicine is not needed, then you may take acetaminophen (Tylenol) or ibuprofen (Advil) as needed. °Take your usually prescribed medications unless otherwise directed. °If you need a refill on your pain medication, please contact your pharmacy. They will contact our office to request authorization.  Prescriptions will not be filled after 5pm or on week-ends. °You should follow a light diet the first few days after arrival home, such as soup and crackers, pudding, etc.unless your doctor has advised otherwise. A high-fiber, low fat diet can be resumed as tolerated.   Be sure to include lots of fluids daily. Most patients will experience some swelling and bruising on the chest and neck area.  Ice packs will help.  Swelling and bruising can take several days to resolve °Most patients will experience some swelling and bruising in the area of the incision. Ice pack will help. Swelling and bruising can take several days to resolve..  °It is common to experience some constipation if taking pain medication after surgery.  Increasing fluid intake and taking a stool softener will usually help or prevent this problem from occurring.  A mild laxative (Milk of Magnesia or Miralax) should be taken according to package directions if there are no bowel movements after 48 hours. ° You may have steri-strips (small skin tapes) in place directly over the incision.  These strips should be left on the skin for 7-10 days.  If your surgeon used skin  glue on the incision, you may shower in 24 hours.  The glue will flake off over the next 2-3 weeks.  Any sutures or staples will be removed at the office during your follow-up visit. You may find that a light gauze bandage over your incision may keep your staples from being rubbed or pulled. You may shower and replace the bandage daily. °ACTIVITIES:  You may resume regular (light) daily activities beginning the next day--such as daily self-care, walking, climbing stairs--gradually increasing activities as tolerated.  You may have sexual intercourse when it is comfortable.  Refrain from any heavy lifting or straining until approved by your doctor. °You may drive when you no longer are taking prescription pain medication, you can comfortably wear a seatbelt, and you can safely maneuver your car and apply brakes ° °You should see your doctor in the office for a follow-up appointment approximately two weeks after your surgery.  Make sure that you call for this appointment within a day or two after you arrive home to insure a convenient appointment time. ° °WHEN TO CALL YOUR DOCTOR: °Fever over 101.0 °Inability to urinate °Nausea and/or vomiting °Extreme swelling or bruising °Continued bleeding from incision. °Increased pain, redness, or drainage from the incision. °Difficulty swallowing or breathing °Muscle cramping or spasms. °Numbness or tingling in hands or feet or around lips. ° °The clinic staff is available to answer your questions during regular business hours.  Please don’t hesitate to call and ask to speak to one of the nurses if you have concerns. ° °For   For further questions, please visit [email or web address]    Follow with Primary MD Sharon Seller, NP in 7 days   Get CBC, CMP, 2 view Chest X ray -  checked next visit within 1 week by Primary MD    Activity: As tolerated with Full fall precautions use walker/cane & assistance as needed  Disposition Home     Diet: Heart Healthy     Special Instructions: If you have smoked or chewed Tobacco  in the last 2 yrs please stop smoking, stop any regular Alcohol  and or any Recreational drug use.  On your next visit with your primary care physician please Get Medicines reviewed and adjusted.  Please request your Prim.MD to go over all Hospital Tests and Procedure/Radiological results at the follow up, please get all Hospital records sent to your Prim MD by signing hospital release before you go home.  If you experience worsening of your admission symptoms, develop shortness of breath, life threatening emergency, suicidal or homicidal thoughts you must seek medical attention immediately by calling 911 or calling your MD immediately  if symptoms less severe.  You Must read complete instructions/literature along with all the possible adverse reactions/side effects for all the Medicines you take and that have been prescribed to you. Take any new Medicines after you have completely understood and accpet all the possible adverse reactions/side effects.

## 2021-09-06 NOTE — Progress Notes (Signed)
Mobility Specialist Progress Note:   09/06/21 1200  Mobility  Activity Ambulated in hall  Level of Assistance Modified independent, requires aide device or extra time  Assistive Device Front wheel walker  Distance Ambulated (ft) 570 ft  Mobility Ambulated independently in hallway  Mobility Response Tolerated well  Mobility performed by Mobility specialist  Bed Position Chair  $Mobility charge 1 Mobility   Pt asx during ambulation. Back in chair with all needs met.   Nelta Numbers Mobility Specialist  Phone [phone]

## 2021-09-06 NOTE — Consult Note (Signed)
   [facility] Inpatient Consult   09/06/2021  [name] 10/08/1950 [id number]  [employer] [employer] [ACO] Patient: [name] Medicare  Primary Care Provider:  Lauree Chandler, NP, [facility]  Patient was assessed for long length of stay [day 34] to check for post hospital transitional community needs. Patient was previously active with [facility].  Met with patient at bedside regarding being restarted with [facility] services. He asked that his wallet with his information was to be obtained. Patient states he is going to return to the [facility] his room is #116 and with his girlfriend.  She called while speaking with patient and patient asked her to give her phone number to this writer and states her phone number is [phone]. He states that the nurse can contact his [name] for information and follow up.    Of note, [facility] Care Management services does not replace or interfere with any services that are arranged by inpatient [facility] care management team.   For additional questions or referrals please contact:   Natividad Brood, RN BSN [facility] Liaison  [phone] business mobile phone Toll free office [phone]  Fax number: [phone] [email or web address]

## 2021-09-06 NOTE — Progress Notes (Signed)
PHARMACY - TOTAL PARENTERAL NUTRITION CONSULT NOTE  Indication: Small bowel obstruction, prolonged ileus  Patient Measurements: Height: 6\' 1"  (185.4 cm) Weight: 70.6 kg (155 lb 10.3 oz) IBW/kg (Calculated) : 79.9 TPN AdjBW (KG): 70 Body mass index is 20.53 kg/m. Usual Weight: 74 kg  Assessment:  70 y.o. male with PMH significant for HTN, CHF with LVEF ~25%, GERD, BPH, prior history of alcoholism who presented with complaints of lower abdominal pain, found to have SBO. Patient has remained NPO/CLD, now for more than 7 days and at risk of becoming severely malnourished. Pharmacy is consulted to manage TPN.   Per RD's conversation with patient, reported he was eating well PTA. States he would have abdominal pain when eating. Per patient 11/15, he sometimes skips meals at home even though his wife is a good cook because he just doesn't feel like eating.  12/3-12/4 update: patient remains distended and still not able to tolerate PO. Patient having small amounts of liquid stool intermittendly but no reliable BMs or significant flatus. Remains with significant ileus and has failed medical management. No surgical intervention planned.  Glucose / Insulin: no hx DM, A1c 6.1% - CBGs mostly controlled, trending low in afternoon while off TPN, not on insulin Electrolytes: all stable wnl (last labs 12/6) Renal: SCr stable WNL, BUN WNL Hepatic: Prealbumin / LFTs / Tbili / TG WNL, albumin 2.6 Intake / Output; MIVF: UOP 0.5 ml/kg/hr, +flatus, LBM 12/6  GI Imaging: 11/3 CT abd: SBO  11/4 Abd XR: small partial SBO 11/6 Abd XR: Persistent SBO  11/7 Abd XR: Persistent SBO  11/8 Abd XR: SBO with interval worsening 11/9 Abd XR: post-op free air, SB dilatation 11/21 CT - no abscess/obstruction, consistent with ileus 11/23 Abd XR: incr gaseous distention, may reflect developing obstruction or ileus  11/25 Abd XR: contrast in R colon  12/3 CT A/P: diffuse dilation of the small bowel w/o transitional  point 12/6 Abd XR: slightly improved ileus 12/6 KUB: air in colon, clinically progressing GI Surgeries / Procedures: 11/9 ex-lap with LOA   Central access: PICC placed 08/10/21 TPN start date:  08/11/21  Nutritional Goals: Goal continuous TPN rate is 90 mL/hr (provides 119 g of protein and 2334 kcals per day)  RD Estimated Needs Total Energy Estimated Needs: 2300-2500 Total Protein Estimated Needs: 115-130 grams Total Fluid Estimated Needs: > 2 L  Current Nutrition:  Cyclic 14-hr TPN (1/2 goal rate) Boost Breeze TID - 2 doses charted yesterday Advanced back to soft diet with fiber supplement 12/5  Plan:   D/c TPN today per Surgery 14/5) - 14-hr cyclic TPN bag already finished this AM D/c TPN labs and nursing orders Change CBG checks to qAC and monitor for hypoglycemia   Laural Benes, PharmD, BCPS Please check AMION for all [facility] contact numbers Clinical Pharmacist 09/06/2021 8:44 AM

## 2021-09-06 NOTE — Progress Notes (Signed)
Progress Note  28 Days Post-Op  Subjective: Pt tolerating soft diet this AM and having bowel function. Denies abdominal pain.   Objective: Vital signs in last 24 hours: Temp:  [97.6 F (36.4 C)-99.3 F (37.4 C)] 97.6 F (36.4 C) (12/07 0817) Pulse Rate:  [65-72] 67 (12/07 0817) Resp:  [14-19] 19 (12/07 0817) BP: (97-111)/(55-80) 111/69 (12/07 0817) SpO2:  [98 %-100 %] 100 % (12/07 0817) Last BM Date: 09/05/21  Intake/Output from previous day: 12/06 0701 - 12/07 0700 In: 1127.1 [P.O.:100; I.V.:1027.1] Out: 850 [Urine:850] Intake/Output this shift: Total I/O In: 240 [P.O.:240] Out: -   PE: General: pleasant, WD, WN male who is laying in bed in NAD Heart: regular, rate, and rhythm. Palpable radial and pedal pulses bilaterally Lungs: CTAB, no wheezes, rhonchi, or rales noted.  Respiratory effort nonlabored Abd: soft, NT, distended, BS hypoactive, incision well healed  MS: all 4 extremities are symmetrical with no cyanosis, clubbing, or edema. Skin: warm and dry with no masses, lesions, or rashes   Lab Results:  Recent Labs    09/05/21 0448 09/06/21 0500  WBC 3.2* 3.0*  HGB 10.0* 10.6*  HCT 30.9* 31.6*  PLT 164 201   BMET Recent Labs    09/04/21 0346 09/05/21 0448  NA 134* 135  K 4.0 4.3  CL 104 103  CO2 26 26  GLUCOSE 101* 99  BUN 19 20  CREATININE 0.51* 0.53*  CALCIUM 8.3* 8.5*   PT/INR No results for input(s): LABPROT, INR in the last 72 hours. CMP     Component Value Date/Time   NA 135 09/05/2021 0448   NA 141 11/15/2017 1054   K 4.3 09/05/2021 0448   CL 103 09/05/2021 0448   CO2 26 09/05/2021 0448   GLUCOSE 99 09/05/2021 0448   BUN 20 09/05/2021 0448   BUN 13 11/15/2017 1054   CREATININE 0.53 (L) 09/05/2021 0448   CREATININE 0.84 09/28/2020 0956   CALCIUM 8.5 (L) 09/05/2021 0448   PROT 5.5 (L) 09/05/2021 0448   PROT 6.5 09/22/2015 0949   ALBUMIN 2.6 (L) 09/05/2021 0448   ALBUMIN 3.9 09/22/2015 0949   AST 14 (L) 09/05/2021 0448   ALT  19 09/05/2021 0448   ALKPHOS 44 09/05/2021 0448   BILITOT 0.3 09/05/2021 0448   BILITOT 0.8 09/22/2015 0949   GFRNONAA >60 09/05/2021 0448   GFRNONAA 90 09/28/2020 0956   GFRAA 104 09/28/2020 0956   Lipase     Component Value Date/Time   LIPASE 24 08/03/2021 1045       Studies/Results: DG Abd Portable 1V  Result Date: 09/05/2021 CLINICAL DATA:  Postoperative ileus. EXAM: PORTABLE ABDOMEN - 1 VIEW COMPARISON:  September 01, 2021. FINDINGS: Slightly improved gaseous distention of large and small bowel is noted suggesting slightly improved ileus. No radio-opaque calculi or other significant radiographic abnormality are seen. IMPRESSION: Probable slightly improved ileus. Electronically Signed   By: Lupita Raider M.D.   On: 09/05/2021 10:44    Anti-infectives: Anti-infectives (From admission, onward)    Start     Dose/Rate Route Frequency Ordered Stop   08/09/21 1000  ceFAZolin (ANCEF) IVPB 2g/100 mL premix  Status:  Discontinued        2 g 200 mL/hr over 30 Minutes Intravenous To Short Stay 08/09/21 0758 08/10/21 1000   08/04/21 2200  oseltamivir (TAMIFLU) capsule 30 mg        30 mg Oral 2 times daily 08/04/21 0822 08/09/21 0959   08/04/21 1000  oseltamivir (TAMIFLU) capsule  75 mg        75 mg Oral  Once 08/04/21 0825 08/04/21 1116   08/03/21 1645  cefTRIAXone (ROCEPHIN) 2 g in sodium chloride 0.9 % 100 mL IVPB  Status:  Discontinued        2 g 200 mL/hr over 30 Minutes Intravenous Every 24 hours 08/03/21 1631 08/06/21 1519   08/03/21 1645  metroNIDAZOLE (FLAGYL) IVPB 500 mg  Status:  Discontinued        500 mg 100 mL/hr over 60 Minutes Intravenous Every 12 hours 08/03/21 1631 08/06/21 1519        Assessment/Plan SBO POD 28 s/p ex lap LOA by Dr. Janee Morn 11/9 with findings of dense adhesion near distal ileum down to the mesentery - afebrile, VSS - NGT initially removed 11/13 but replaced 11/19 due to persistent ileus w/ intolerance of PO.  - CT repeated 11/21 without  abscess or definite transition zone, dilated loops of small bowel with air fluid levels consistent with ileus. - SBO protocol started 11/23; KUB 11/25 showed contrast in Rt colon, KUB 11/27 w/ contrast in rectum but persistently dilated small bowel . - NG removed again 11/30, had vomiting 12/1. Backed off to clears 12/2 .  - CT A/P repeated 12/3 - ongoing diffuse dilation of the small bowel without transition zone. This is consistent with ileus, not obstruction. Clinically patient appears to be improving. TPN 1/2 12/6, would continue to wean off.  - mobilize/OOB  - advanced to soft diet 12/6 and tolerating - may be ready to go home in the next few days if continues to tolerate diet      FEN - soft diet, DC TPN ID - ancef periop VTE - SCDs, lovenox Foley - placed periop 11/9, d/c 11/10   AKI - resolved HTN HLD COPD CHF   LOS: 33 days    Juliet Rude, [facility] 09/06/2021, 8:42 AM Please see Amion for pager number during day hours 7:00am-4:30pm

## 2021-09-06 NOTE — Progress Notes (Signed)
PROGRESS NOTE        PATIENT DETAILS Name: [name] Age: 70 y.o. Sex: male Date of Birth: Apr 04, 1951 Admit Date: 08/03/2021 Admitting Physician Darlin Drop, DO WUJ:[id number], Janene Harvey, NP  Brief Narrative: Patient is a 70 y.o. male with history of HFrEF, EtOH use-who presented with SBO-subsequently underwent exploratory laparotomy on 11/9-further hospital course complicated by prolonged ileus.  See below for further details.  Subjective:  Patient in bed, appears comfortable, denies any headache, no fever, no chest pain or pressure, no shortness of breath , no abdominal pain, +ve BMs. No new focal weakness.    Objective:  Vitals: Blood pressure 111/69, pulse 67, temperature 97.6 F (36.4 C), temperature source Oral, resp. rate 19, height 6\' 1"  (1.854 m), weight 70.6 kg, SpO2 100 %.   Exam:  Awake Alert, No new F.N deficits, Normal affect Progress.AT,PERRAL Supple Neck, No JVD,   Symmetrical Chest wall movement, Good air movement bilaterally, CTAB RRR,No Gallops, Rubs or new Murmurs,  +ve B.Sounds, Abd Soft and only mildly distended No tenderness,   No Cyanosis, Clubbing or edema     Assessment/Plan:  SBO due to previous abdominal radiation - s/p exploratory laparotomy on 11/9-postop course complicated by prolonged ileus: General surgery following-NG tube DC'd on 11/30-on lcears, is passing some flatus and had a small bowel movement on 09/05/2021 & 09/06/21, defer management to CCS - on TNA + Soft.  HFrEF (EF 20% on TTE in 2017): Volume status stable/compensated.  Does not require diuretics.  Influenza A: Completed course of Tamiflu-he appears asymptomatic.  HTN: BP stable-resume lisinopril when oral intake resumes.  HLD: PO Crestor.  CAD: No anginal symptoms-PO ASA.  Nutrition Status: Nutrition Problem: Severe Malnutrition Etiology: chronic illness (CHF) Signs/Symptoms: severe muscle depletion, severe fat depletion Interventions: Boost  Breeze, TPN  BMI Estimated body mass index is 20.53 kg/m as calculated from the following:   Height as of this encounter: 6\' 1"  (1.854 m).   Weight as of this encounter: 70.6 kg.    Procedures: None Consults: CCS DVT Prophylaxis: Lovenox Code Status:Full code  Family Communication: None at bedside  Time spent: 15- minutes-Greater than 50% of this time was spent in counseling, explanation of diagnosis, planning of further management, and coordination of care.   Disposition Plan: Status is: Inpatient  Remains inpatient appropriate because: SBO-s/p laparotomy with prolonged ileus-on TPN-NG tube still in place.   Diet: Diet Order             DIET SOFT Room service appropriate? Yes; Fluid consistency: Thin  Diet effective now                     Antimicrobial agents: Anti-infectives (From admission, onward)    Start     Dose/Rate Route Frequency Ordered Stop   08/09/21 1000  ceFAZolin (ANCEF) IVPB 2g/100 mL premix  Status:  Discontinued        2 g 200 mL/hr over 30 Minutes Intravenous To Short Stay 08/09/21 0758 08/10/21 1000   08/04/21 2200  oseltamivir (TAMIFLU) capsule 30 mg        30 mg Oral 2 times daily 08/04/21 0822 08/09/21 0959   08/04/21 1000  oseltamivir (TAMIFLU) capsule 75 mg        75 mg Oral  Once 08/04/21 0825 08/04/21 1116   08/03/21 1645  cefTRIAXone (ROCEPHIN) 2  g in sodium chloride 0.9 % 100 mL IVPB  Status:  Discontinued        2 g 200 mL/hr over 30 Minutes Intravenous Every 24 hours 08/03/21 1631 08/06/21 1519   08/03/21 1645  metroNIDAZOLE (FLAGYL) IVPB 500 mg  Status:  Discontinued        500 mg 100 mL/hr over 60 Minutes Intravenous Every 12 hours 08/03/21 1631 08/06/21 1519        MEDICATIONS: Scheduled Meds:  aspirin  81 mg Oral Daily   Chlorhexidine Gluconate Cloth  6 each Topical Daily   enoxaparin (LOVENOX) injection  40 mg Subcutaneous Q24H   feeding supplement  1 Container Oral TID BM   lip balm  1 application Topical BID    mouth rinse  15 mL Mouth Rinse BID   polycarbophil  625 mg Oral Daily   rosuvastatin  5 mg Oral Daily   Continuous Infusions:    PRN Meds:.acetaminophen, albuterol, alum & mag hydroxide-simeth, bisacodyl, enalaprilat, hydrALAZINE, magic mouthwash, menthol-cetylpyridinium, metoprolol tartrate, morphine injection, [DISCONTINUED] ondansetron **OR** ondansetron (ZOFRAN) IV, phenol, prochlorperazine, simethicone   I have personally reviewed following labs and imaging studies  LABORATORY DATA:  Recent Labs  Lab 09/02/21 0456 09/03/21 0502 09/05/21 0448 09/06/21 0500  WBC 5.0 3.4* 3.2* 3.0*  HGB 10.2* 10.1* 10.0* 10.6*  HCT 31.2* 30.4* 30.9* 31.6*  PLT 142* 133* 164 201  MCV 98.4 98.1 98.4 96.9  MCH 32.2 32.6 31.8 32.5  MCHC 32.7 33.2 32.4 33.5  RDW 12.8 12.8 13.0 13.0    Recent Labs  Lab 08/31/21 0453 09/02/21 0456 09/03/21 0502 09/04/21 0346 09/05/21 0448  NA 137 137 135 134* 135  K 4.1 4.2 4.4 4.0 4.3  CL 105 105 103 104 103  CO2 26 27 27 26 26   GLUCOSE 131* 124* 99 101* 99  BUN 25* 22 23 19 20   CREATININE 0.55* 0.54* 0.53* 0.51* 0.53*  CALCIUM 8.6* 8.5* 8.4* 8.3* 8.5*  AST 32  --  17 15 14*  ALT 57*  --  28 23 19   ALKPHOS 51  --  47 43 44  BILITOT 0.4  --  0.4 0.4 0.3  ALBUMIN 2.7*  --  2.6* 2.6* 2.6*  MG 1.9 1.9 2.0 2.0  --      RADIOLOGY STUDIES/RESULTS: DG Abd Portable 1V  Result Date: 09/05/2021 CLINICAL DATA:  Postoperative ileus. EXAM: PORTABLE ABDOMEN - 1 VIEW COMPARISON:  September 01, 2021. FINDINGS: Slightly improved gaseous distention of large and small bowel is noted suggesting slightly improved ileus. No radio-opaque calculi or other significant radiographic abnormality are seen. IMPRESSION: Probable slightly improved ileus. Electronically Signed   By: M.D.   On: 09/05/2021 10:44     LOS: 33 days   Signature  September 03, 2021 M.D on 09/06/2021 at 9:33 AM   -  To page go to [email or web address]

## 2021-09-06 NOTE — TOC Progression Note (Signed)
Transition of Care [facility]) - Progression Note    Patient Details  Name: LANNY LIPKIN MRN: [id number] Date of Birth: 06/30/1951  Transition of Care [facility]) CM/SW Contact  Nadene Rubins Adria Devon, RN Phone Number: 09/06/2021, 10:19 AM  Clinical Narrative:    Possible discharge tomorrow. Updated Bjorn Loser with [facility]    Expected Discharge Plan: Home w Home Health Services Barriers to Discharge: Continued Medical Work up  Expected Discharge Plan and Services Expected Discharge Plan: Home w Home Health Services   Discharge Planning Services: CM Consult Post Acute Care Choice: Home Health, Durable Medical Equipment Living arrangements for the past 2 months: Hotel/Motel                 DME Arranged: Dan Humphreys rolling DME Agency: [employer] Date DME Agency Contacted: 08/14/21 Time DME Agency Contacted: 1239 Representative spoke with at DME Agency: Velna Hatchet HH Arranged: RN, PT [facility] Agency: [facility] Date [facility] Agency Contacted: 08/14/21 Time HH Agency Contacted: 1239 Representative spoke with at [facility] Agency: Marylene Land   Social Determinants of Health (SDOH) Interventions    Readmission Risk Interventions No flowsheet data found.

## 2021-09-07 LAB — CREATININE, SERUM
Creatinine, Ser: 0.63 mg/dL (ref 0.61–1.24)
GFR, Estimated: >60 mL/min (ref >60)

## 2021-09-07 LAB — BASIC METABOLIC PANEL
Anion gap: 8 (ref 5–15)
BUN: 17 mg/dL (ref 8–23)
CO2: 25 mmol/L (ref 22–32)
Calcium: 8.7 mg/dL — ABNORMAL LOW (ref 8.9–10.3)
Chloride: 103 mmol/L (ref 98–111)
Creatinine, Ser: 0.65 mg/dL (ref 0.61–1.24)
GFR, Estimated: >60 mL/min (ref >60)
Glucose, Bld: 77 mg/dL (ref 70–99)
Potassium: 4 mmol/L (ref 3.5–5.1)
Sodium: 136 mmol/L (ref 135–145)

## 2021-09-07 LAB — CBC
HCT: 32.9 % — ABNORMAL LOW (ref 39.0–52.0)
Hemoglobin: 10.8 g/dL — ABNORMAL LOW (ref 13.0–17.0)
MCH: 32.2 pg (ref 26.0–34.0)
MCHC: 32.8 g/dL (ref 30.0–36.0)
MCV: 98.2 fL (ref 80.0–100.0)
Platelets: 200 10*3/uL (ref 150–400)
RBC: 3.35 MIL/uL — ABNORMAL LOW (ref 4.22–5.81)
RDW: 13.1 % (ref 11.5–15.5)
WBC: 3 10*3/uL — ABNORMAL LOW (ref 4.0–10.5)
nRBC: 0 % (ref 0.0–0.2)

## 2021-09-07 LAB — MAGNESIUM: Magnesium: 1.8 mg/dL (ref 1.7–2.4)

## 2021-09-07 LAB — GLUCOSE, CAPILLARY
Glucose-Capillary: 67 mg/dL — ABNORMAL LOW (ref 70–99)
Glucose-Capillary: 53 mg/dL — ABNORMAL LOW (ref 70–99)
Glucose-Capillary: 85 mg/dL (ref 70–99)

## 2021-09-07 NOTE — Progress Notes (Signed)
Dressing is clean, dry, intact. Patient was instructed to monitor for bleeding,swelling and to report any concerns to his nurse. Patient denied any c/o discomfort upon removal and line was removed without resistance.

## 2021-09-07 NOTE — Progress Notes (Signed)
Mobility Specialist Progress Note   09/07/21 1644  Mobility  Activity Ambulated in hall  Level of Assistance Modified independent, requires aide device or extra time  Assistive Device Front wheel walker  Distance Ambulated (ft) 725 ft  Mobility Ambulated independently in hallway  Mobility Response Tolerated well  Mobility performed by [employer] charge 1 Mobility   Received pt in bed having no complaints and agreeable to mobility. Asymptomatic throughout ambulation, returned back to bed w/ call bell by side and all needs met.  Holland Falling Mobility Specialist Phone Number [phone]

## 2021-09-07 NOTE — Progress Notes (Signed)
Progress Note  29 Days Post-Op  Subjective: Pt reports he does not like the food here but he is eating it. Denies nausea or vomiting. He is having bowel movements. He consented to me calling his aunt to discuss post-operative care with her.   Objective: Vital signs in last 24 hours: Temp:  [97.7 F (36.5 C)-98.7 F (37.1 C)] 98.3 F (36.8 C) (12/08 0435) Pulse Rate:  [65-73] 73 (12/08 0435) Resp:  [18-20] 18 (12/08 0435) BP: (104-118)/(69-98) 104/71 (12/08 0435) SpO2:  [98 %-100 %] 100 % (12/08 0435) Weight:  [63.6 kg] 63.6 kg (12/08 0543) Last BM Date: (P) 09/05/21  Intake/Output from previous day: 12/07 0701 - 12/08 0700 In: 1160 [P.O.:1160] Out: 50 [Urine:50] Intake/Output this shift: No intake/output data recorded.  PE: General: pleasant, WD, WN male who is laying in bed in NAD Heart: regular, rate, and rhythm.  Lungs: Respiratory effort nonlabored Abd: soft, NT, distended, BS hypoactive, incision well healed  MS: all 4 extremities are symmetrical with no cyanosis, clubbing, or edema. Skin: warm and dry with no masses, lesions, or rashes   Lab Results:  Recent Labs    09/06/21 0500 09/07/21 0418  WBC 3.0* 3.0*  HGB 10.6* 10.8*  HCT 31.6* 32.9*  PLT 201 200   BMET Recent Labs    09/05/21 0448 09/07/21 0418  NA 135 136  K 4.3 4.0  CL 103 103  CO2 26 25  GLUCOSE 99 77  BUN 20 17  CREATININE 0.53* 0.65  0.63  CALCIUM 8.5* 8.7*   PT/INR No results for input(s): LABPROT, INR in the last 72 hours. CMP     Component Value Date/Time   NA 136 09/07/2021 0418   NA 141 11/15/2017 1054   K 4.0 09/07/2021 0418   CL 103 09/07/2021 0418   CO2 25 09/07/2021 0418   GLUCOSE 77 09/07/2021 0418   BUN 17 09/07/2021 0418   BUN 13 11/15/2017 1054   CREATININE 0.63 09/07/2021 0418   CREATININE 0.65 09/07/2021 0418   CREATININE 0.84 09/28/2020 0956   CALCIUM 8.7 (L) 09/07/2021 0418   PROT 5.5 (L) 09/05/2021 0448   PROT 6.5 09/22/2015 0949   ALBUMIN 2.6 (L)  09/05/2021 0448   ALBUMIN 3.9 09/22/2015 0949   AST 14 (L) 09/05/2021 0448   ALT 19 09/05/2021 0448   ALKPHOS 44 09/05/2021 0448   BILITOT 0.3 09/05/2021 0448   BILITOT 0.8 09/22/2015 0949   GFRNONAA >60 09/07/2021 0418   GFRNONAA >60 09/07/2021 0418   GFRNONAA 90 09/28/2020 0956   GFRAA 104 09/28/2020 0956   Lipase     Component Value Date/Time   LIPASE 24 08/03/2021 1045       Studies/Results: DG Abd Portable 1V  Result Date: 09/05/2021 CLINICAL DATA:  Postoperative ileus. EXAM: PORTABLE ABDOMEN - 1 VIEW COMPARISON:  September 01, 2021. FINDINGS: Slightly improved gaseous distention of large and small bowel is noted suggesting slightly improved ileus. No radio-opaque calculi or other significant radiographic abnormality are seen. IMPRESSION: Probable slightly improved ileus. Electronically Signed   By: Lupita Raider M.D.   On: 09/05/2021 10:44    Anti-infectives: Anti-infectives (From admission, onward)    Start     Dose/Rate Route Frequency Ordered Stop   08/09/21 1000  ceFAZolin (ANCEF) IVPB 2g/100 mL premix  Status:  Discontinued        2 g 200 mL/hr over 30 Minutes Intravenous To Short Stay 08/09/21 0758 08/10/21 1000   08/04/21 2200  oseltamivir (TAMIFLU) capsule  30 mg        30 mg Oral 2 times daily 08/04/21 0822 08/09/21 0959   08/04/21 1000  oseltamivir (TAMIFLU) capsule 75 mg        75 mg Oral  Once 08/04/21 0825 08/04/21 1116   08/03/21 1645  cefTRIAXone (ROCEPHIN) 2 g in sodium chloride 0.9 % 100 mL IVPB  Status:  Discontinued        2 g 200 mL/hr over 30 Minutes Intravenous Every 24 hours 08/03/21 1631 08/06/21 1519   08/03/21 1645  metroNIDAZOLE (FLAGYL) IVPB 500 mg  Status:  Discontinued        500 mg 100 mL/hr over 60 Minutes Intravenous Every 12 hours 08/03/21 1631 08/06/21 1519        Assessment/Plan SBO POD 29 s/p ex lap LOA by Dr. Janee Morn 11/9 with findings of dense adhesion near distal ileum down to the mesentery - afebrile, VSS - NGT  initially removed 11/13 but replaced 11/19 due to persistent ileus w/ intolerance of PO.  - CT repeated 11/21 without abscess or definite transition zone, dilated loops of small bowel with air fluid levels consistent with ileus. - SBO protocol started 11/23; KUB 11/25 showed contrast in Rt colon, KUB 11/27 w/ contrast in rectum but persistently dilated small bowel . - NG removed again 11/30, had vomiting 12/1. Backed off to clears 12/2 .  - CT A/P repeated 12/3 - ongoing diffuse dilation of the small bowel without transition zone. This is consistent with ileus, not obstruction. Clinically patient appears to be improving. TPN 1/2 12/6, now off - remove PICC today  - mobilize/OOB  - advanced to soft diet 12/6 and tolerating - stable for discharge from a surgical standpoint. Follow up and instructions in AVS and I spoke with his aunt on the phone today.      FEN - soft diet ID - ancef periop VTE - SCDs, lovenox Foley - placed periop 11/9, d/c 11/10   AKI - resolved HTN HLD COPD CHF   LOS: 34 days    Juliet Rude, [facility] 09/07/2021, 8:29 AM Please see Amion for pager number during day hours 7:00am-4:30pm

## 2021-09-07 NOTE — Progress Notes (Signed)
PROGRESS NOTE        PATIENT DETAILS Name: [name] Age: 70 y.o. Sex: male Date of Birth: 12/05/1950 Admit Date: 08/03/2021 Admitting Physician Darlin Drop, DO EXH:[id number], Janene Harvey, NP  Brief Narrative: Patient is a 70 y.o. male with history of HFrEF, EtOH use-who presented with SBO-subsequently underwent exploratory laparotomy on 11/9-further hospital course complicated by prolonged ileus.  See below for further details.  Subjective:  Patient in bed, appears comfortable, denies any headache, no fever, no chest pain or pressure, no shortness of breath , no abdominal pain, ++ BMs. No new focal weakness.   Objective:  Vitals: Blood pressure 104/71, pulse 73, temperature 98.3 F (36.8 C), temperature source Oral, resp. rate 18, height 6\' 1"  (1.854 m), weight 63.6 kg, SpO2 100 %.   Exam:  Awake Alert, No new F.N deficits, Normal affect [place].AT,PERRAL Supple Neck, No JVD,   Symmetrical Chest wall movement, Good air movement bilaterally, CTAB RRR,No Gallops, Rubs or new Murmurs,  +ve B.Sounds, Abd Soft, No tenderness,   No Cyanosis, Clubbing or edema    Assessment/Plan:  SBO due to previous abdominal radiation - s/p exploratory laparotomy on 11/9-postop course complicated by prolonged ileus: General surgery following-NG tube DC'd on 11/30-with increasing his activity level and walking in the hallway he has bowel function has improved considerably no multiple bowel movements on 09/07/2021, discontinue PICC line and TNA.  Advance diet if tolerates will discharge tomorrow  HFrEF (EF 20% on TTE in 2017): Volume status stable/compensated.  Does not require diuretics.  Influenza A: Completed course of Tamiflu-he appears asymptomatic.  HTN: BP stable-resume lisinopril when oral intake resumes.  HLD: PO Crestor.  CAD: No anginal symptoms-PO ASA.  Nutrition Status: Nutrition Problem: Severe Malnutrition Etiology: chronic illness  (CHF) Signs/Symptoms: severe muscle depletion, severe fat depletion Interventions: Boost Breeze, TPN  BMI Estimated body mass index is 18.5 kg/m as calculated from the following:   Height as of this encounter: 6\' 1"  (1.854 m).   Weight as of this encounter: 63.6 kg.    Procedures: None Consults: CCS DVT Prophylaxis: Lovenox Code Status:Full code  Family Communication: Aunt [phone] - 09/07/21  Time spent: 15- minutes-Greater than 50% of this time was spent in counseling, explanation of diagnosis, planning of further management, and coordination of care.   Disposition Plan: Status is: Inpatient  Remains inpatient appropriate because: SBO-s/p laparotomy with prolonged ileus-on TPN-NG tube still in place.   Diet: Diet Order             DIET SOFT Room service appropriate? Yes; Fluid consistency: Thin  Diet effective now                     Antimicrobial agents: Anti-infectives (From admission, onward)    Start     Dose/Rate Route Frequency Ordered Stop   08/09/21 1000  ceFAZolin (ANCEF) IVPB 2g/100 mL premix  Status:  Discontinued        2 g 200 mL/hr over 30 Minutes Intravenous To Short Stay 08/09/21 0758 08/10/21 1000   08/04/21 2200  oseltamivir (TAMIFLU) capsule 30 mg        30 mg Oral 2 times daily 08/04/21 0822 08/09/21 0959   08/04/21 1000  oseltamivir (TAMIFLU) capsule 75 mg        75 mg Oral  Once 08/04/21 0825 08/04/21 1116  08/03/21 1645  cefTRIAXone (ROCEPHIN) 2 g in sodium chloride 0.9 % 100 mL IVPB  Status:  Discontinued        2 g 200 mL/hr over 30 Minutes Intravenous Every 24 hours 08/03/21 1631 08/06/21 1519   08/03/21 1645  metroNIDAZOLE (FLAGYL) IVPB 500 mg  Status:  Discontinued        500 mg 100 mL/hr over 60 Minutes Intravenous Every 12 hours 08/03/21 1631 08/06/21 1519        MEDICATIONS: Scheduled Meds:  aspirin  81 mg Oral Daily   Chlorhexidine Gluconate Cloth  6 each Topical Daily   enoxaparin (LOVENOX) injection  40 mg  Subcutaneous Q24H   feeding supplement  1 Container Oral TID BM   lip balm  1 application Topical BID   mouth rinse  15 mL Mouth Rinse BID   polycarbophil  625 mg Oral Daily   rosuvastatin  5 mg Oral Daily   Continuous Infusions:    PRN Meds:.acetaminophen, albuterol, alum & mag hydroxide-simeth, bisacodyl, enalaprilat, hydrALAZINE, magic mouthwash, menthol-cetylpyridinium, metoprolol tartrate, morphine injection, [DISCONTINUED] ondansetron **OR** ondansetron (ZOFRAN) IV, phenol, prochlorperazine, simethicone   I have personally reviewed following labs and imaging studies  LABORATORY DATA:  Recent Labs  Lab 09/02/21 0456 09/03/21 0502 09/05/21 0448 09/06/21 0500 09/07/21 0418  WBC 5.0 3.4* 3.2* 3.0* 3.0*  HGB 10.2* 10.1* 10.0* 10.6* 10.8*  HCT 31.2* 30.4* 30.9* 31.6* 32.9*  PLT 142* 133* 164 201 200  MCV 98.4 98.1 98.4 96.9 98.2  MCH 32.2 32.6 31.8 32.5 32.2  MCHC 32.7 33.2 32.4 33.5 32.8  RDW 12.8 12.8 13.0 13.0 13.1    Recent Labs  Lab 09/02/21 0456 09/03/21 0502 09/04/21 0346 09/05/21 0448 09/07/21 0418  NA 137 135 134* 135 136  K 4.2 4.4 4.0 4.3 4.0  CL 105 103 104 103 103  CO2 27 27 26 26 25   GLUCOSE 124* 99 101* 99 77  BUN 22 23 19 20 17   CREATININE 0.54* 0.53* 0.51* 0.53* 0.65  0.63  CALCIUM 8.5* 8.4* 8.3* 8.5* 8.7*  AST  --  17 15 14*  --   ALT  --  28 23 19   --   ALKPHOS  --  47 43 44  --   BILITOT  --  0.4 0.4 0.3  --   ALBUMIN  --  2.6* 2.6* 2.6*  --   MG 1.9 2.0 2.0  --  1.8     RADIOLOGY STUDIES/RESULTS: DG Abd Portable 1V  Result Date: 09/05/2021 CLINICAL DATA:  Postoperative ileus. EXAM: PORTABLE ABDOMEN - 1 VIEW COMPARISON:  September 01, 2021. FINDINGS: Slightly improved gaseous distention of large and small bowel is noted suggesting slightly improved ileus. No radio-opaque calculi or other significant radiographic abnormality are seen. IMPRESSION: Probable slightly improved ileus. Electronically Signed   By: M.D.   On:  09/05/2021 10:44     LOS: 34 days   Signature  September 03, 2021 M.D on 09/07/2021 at 9:15 AM   -  To page go to [email or web address]

## 2021-09-07 NOTE — Progress Notes (Addendum)
Patient was informed of PICC line removal procedure. Prior to line removal site is clean and dry. Dressing was clean and dry but noted to be loose around the edges. HOB was lowered to 17 degrees as patient requested not to have HOB flat. Patient was instructed to hold his breath during line removal and was compliant with this instruction. Pressure was held to insertion site with Vaseline gauze and 2x2 gauze.  Perssure was held for 7 minutes. Peri insertion site was cleaned with Chlorhexidine wipe. A Tegaderm occlusive dressing was placed over Vaseline and 2x2 gauze. Patient was instructed to remain in bed with [facility] lowered for 30 minutes following line removal. Patient was also instructed to leave dressing in place for 24 hours and to not submerge wound in water or soak wound for the next 7 days. Patient was instructed to pat wound dry after showering- if he is cleared to shower at home by doctor/DC instructions. Secure chat was sent to patient's RN regarding PICC line removal instructions.

## 2021-09-08 ENCOUNTER — Other Ambulatory Visit: Payer: Self-pay

## 2021-09-08 DIAGNOSIS — F1020 Alcohol dependence, uncomplicated: Secondary | ICD-10-CM

## 2021-09-08 DIAGNOSIS — K56609 Unspecified intestinal obstruction, unspecified as to partial versus complete obstruction: Secondary | ICD-10-CM | POA: Diagnosis not present

## 2021-09-08 DIAGNOSIS — E43 Unspecified severe protein-calorie malnutrition: Secondary | ICD-10-CM

## 2021-09-08 DIAGNOSIS — I5022 Chronic systolic (congestive) heart failure: Secondary | ICD-10-CM

## 2021-09-08 LAB — CBC
HCT: 32.7 % — ABNORMAL LOW (ref 39.0–52.0)
Hemoglobin: 11.1 g/dL — ABNORMAL LOW (ref 13.0–17.0)
MCH: 32.5 pg (ref 26.0–34.0)
MCHC: 33.9 g/dL (ref 30.0–36.0)
MCV: 95.6 fL (ref 80.0–100.0)
Platelets: 224 10*3/uL (ref 150–400)
RBC: 3.42 MIL/uL — ABNORMAL LOW (ref 4.22–5.81)
RDW: 12.8 % (ref 11.5–15.5)
WBC: 3.1 10*3/uL — ABNORMAL LOW (ref 4.0–10.5)
nRBC: 0 % (ref 0.0–0.2)

## 2021-09-08 LAB — COMPREHENSIVE METABOLIC PANEL
ALT: 21 U/L (ref 0–44)
AST: 16 U/L (ref 15–41)
Albumin: 2.8 g/dL — ABNORMAL LOW (ref 3.5–5.0)
Alkaline Phosphatase: 47 U/L (ref 38–126)
Anion gap: 7 (ref 5–15)
BUN: 17 mg/dL (ref 8–23)
CO2: 26 mmol/L (ref 22–32)
Calcium: 8.6 mg/dL — ABNORMAL LOW (ref 8.9–10.3)
Chloride: 101 mmol/L (ref 98–111)
Creatinine, Ser: 0.84 mg/dL (ref 0.61–1.24)
GFR, Estimated: >60 mL/min (ref >60)
Glucose, Bld: 94 mg/dL (ref 70–99)
Potassium: 4.1 mmol/L (ref 3.5–5.1)
Sodium: 134 mmol/L — ABNORMAL LOW (ref 135–145)
Total Bilirubin: 0.6 mg/dL (ref 0.3–1.2)
Total Protein: 5.8 g/dL — ABNORMAL LOW (ref 6.5–8.1)

## 2021-09-08 LAB — GLUCOSE, CAPILLARY: Glucose-Capillary: 84 mg/dL (ref 70–99)

## 2021-09-08 LAB — MAGNESIUM: Magnesium: 1.8 mg/dL (ref 1.7–2.4)

## 2021-09-08 MED ORDER — CARVEDILOL 3.125 MG PO TABS: 3.1250 mg | ORAL_TABLET | Freq: Two times a day (BID) | ORAL | 11 refills | Status: DC

## 2021-09-08 MED ORDER — DOCUSATE SODIUM 100 MG PO CAPS: 100.0000 mg | ORAL_CAPSULE | Freq: Every day | ORAL | 0 refills | Status: AC

## 2021-09-08 MED ORDER — ACETAMINOPHEN 325 MG PO TABS: 650.0000 mg | ORAL_TABLET | Freq: Four times a day (QID) | ORAL | Status: AC | PRN

## 2021-09-08 MED ORDER — CALCIUM POLYCARBOPHIL 625 MG PO TABS: 625.0000 mg | ORAL_TABLET | Freq: Every day | ORAL | 0 refills | Status: AC

## 2021-09-08 NOTE — Progress Notes (Signed)
Mobility Specialist Progress Note:   09/08/21 0945  Mobility  Activity Ambulated in hall  Level of Assistance Modified independent, requires aide device or extra time  Assistive Device Front wheel walker  Distance Ambulated (ft) 600 ft  Mobility Ambulated independently in hallway  Mobility Response Tolerated well  Mobility performed by Mobility specialist  $Mobility charge 1 Mobility   Pt asx during ambulation. Eager for d/c.  Addison Lank Mobility Specialist  Phone [phone]

## 2021-09-08 NOTE — Plan of Care (Signed)
Patient understanding of plan of care and discharge

## 2021-09-08 NOTE — Discharge Summary (Signed)
[name] UVO:[id number] DOB: [birth year] DOA: 08/03/2021  PCP: Sharon Seller, NP  Admit date: 08/03/2021  Discharge date: 09/08/2021  Admitted From: Home   Disposition:  Home   Recommendations for Outpatient Follow-up:   Follow up with PCP in 1-2 weeks  PCP Please obtain BMP/CBC, 2 view CXR in 1week,  (see Discharge instructions)   PCP Please follow up on the following pending results:    Home Health: None   Equipment/Devices: None  Consultations: [facility] Discharge Condition: Stable    CODE STATUS: Full    Diet Recommendation: Heart Healthy     Chief Complaint  Patient presents with   Sore Throat     Brief history of present illness from the day of admission and additional interim summary    Patient is a 70 y.o. male with history of HFrEF, EtOH use-who presented with SBO-subsequently underwent exploratory laparotomy on 11/9-further hospital course complicated by prolonged ileus.                                                                 Hospital Course   SBO due to previous abdominal radiation - s/p exploratory laparotomy on 11/9-postop course complicated by prolonged ileus: General surgery following-NG tube DC'd on 11/30-with increasing his activity level and walking in the hallway he has bowel function has improved considerably now multiple bowel movements last 2 days, discontinued PICC line and TNA.  tolerating diet x 3 days, DC home, cleared by CCS.   HFrEF (EF 20% on TTE in 2017): Volume status stable/compensated.  Does not require diuretics.   Influenza A: Completed course of Tamiflu-he appears asymptomatic.   HTN: BP stable-Coreg upon DC.   HLD: PO Crestor.   CAD: No anginal symptoms-PO ASA, Statin and Coreg.   Discharge diagnosis     Principal Problem:   SBO (small bowel  obstruction) (HCC) Active Problems:   Chronic systolic CHF (congestive heart failure) (HCC)   Protein-calorie malnutrition, severe   Aortic atherosclerosis (HCC)    Discharge instructions    Discharge Instructions     Diet - low sodium heart healthy   Complete by: As directed    Discharge instructions   Complete by: As directed    Follow with Primary MD Sharon Seller, NP in 7 days   Get CBC, CMP, 2 view Chest X ray -  checked next visit within 1 week by Primary MD    Activity: As tolerated with Full fall precautions use walker/cane & assistance as needed  Disposition Home     Diet: Heart Healthy    Special Instructions: If you have smoked or chewed Tobacco  in the last 2 yrs please stop smoking, stop any regular Alcohol  and or any Recreational drug use.  On your next visit with your primary care  physician please Get Medicines reviewed and adjusted.  Please request your Prim.MD to go over all Hospital Tests and Procedure/Radiological results at the follow up, please get all Hospital records sent to your Prim MD by signing hospital release before you go home.  If you experience worsening of your admission symptoms, develop shortness of breath, life threatening emergency, suicidal or homicidal thoughts you must seek medical attention immediately by calling 911 or calling your MD immediately  if symptoms less severe.  You Must read complete instructions/literature along with all the possible adverse reactions/side effects for all the Medicines you take and that have been prescribed to you. Take any new Medicines after you have completely understood and accpet all the possible adverse reactions/side effects.   Discharge wound care:   Complete by: As directed    Keep your abdominal scar site clean and dry at all times   Increase activity slowly   Complete by: As directed        Discharge Medications   Allergies as of 09/08/2021   No Known Allergies      Medication  List     STOP taking these medications    lisinopril 20 MG tablet Commonly known as: ZESTRIL       TAKE these medications    acetaminophen 325 MG tablet Commonly known as: TYLENOL Take 2 tablets (650 mg total) by mouth every 6 (six) hours as needed for moderate pain or mild pain (headache).   aspirin EC 81 MG tablet Take 1 tablet (81 mg total) by mouth daily.   carvedilol 3.125 MG tablet Commonly known as: Coreg Take 1 tablet (3.125 mg total) by mouth 2 (two) times daily.   Clear Eyes Pure Relief PF 0.25 % Soln Generic drug: Glycerin (PF) Place 1-2 drops into the left eye as needed (for redness).   docusate sodium 100 MG capsule Commonly known as: Colace Take 1 capsule (100 mg total) by mouth daily.   polycarbophil 625 MG tablet Commonly known as: FIBERCON Take 1 tablet (625 mg total) by mouth daily. Start taking on: September 09, 2021   rosuvastatin 5 MG tablet Commonly known as: CRESTOR TAKE 1 TABLET(5 MG) BY MOUTH DAILY What changed: See the new instructions.   traZODone 100 MG tablet Commonly known as: DESYREL TAKE 1 TABLET(100 MG) BY MOUTH AT BEDTIME AS NEEDED FOR SLEEP What changed: See the new instructions.               Durable Medical Equipment  (From admission, onward)           Start     Ordered   08/07/21 1538  For home use only DME Walker rolling  Once       Question Answer Comment  Walker: Other   Comments 2 wheels   Patient needs a walker to treat with the following condition Ambulatory dysfunction      08/07/21 1537              Discharge Care Instructions  (From admission, onward)           Start     Ordered   09/08/21 0000  Discharge wound care:       Comments: Keep your abdominal scar site clean and dry at all times   09/08/21 0839             Follow-up Information     [facility] Follow up.   Why: Company walker was ordered through [employer] information: [address]  [place]  [address] [phone]         [facility] Follow up.   Specialty: Home Health Services Contact information: [address] [place] [address] [phone]         Violeta Gelinas, MD. Go on 10/10/2021.   Specialty: General Surgery Why: 2:00 PM. Please arrive 30 min prior to appointment time. Bring photo ID and insurance information. Contact information: [address] [place] [address] [phone]         Sharon Seller, NP. Schedule an appointment as soon as possible for a visit in 1 week(s).   Specialty: Geriatric Medicine Contact information: [address] [place] [address] [phone]         Pricilla Riffle, MD .   Specialty: Cardiology Contact information: [address] [place] [address] [phone]                 Major procedures and Radiology Reports - PLEASE review detailed and final reports thoroughly  -       CT ABDOMEN PELVIS W CONTRAST  Result Date: 09/02/2021 CLINICAL DATA:  Abdominal distension. EXAM: CT ABDOMEN AND PELVIS WITH CONTRAST TECHNIQUE: Multidetector CT imaging of the abdomen and pelvis was performed using the standard protocol following bolus administration of intravenous contrast. CONTRAST:  OMNIPAQUE IOHEXOL 350 MG/ML SOLN COMPARISON:  August 21, 2021 FINDINGS: Lower chest: Partially visualized scarring versus atelectasis in the lung bases. Hepatobiliary: Redemonstrated are multiple liver cysts. Small amount of layering sludge in the gallbladder. No CT evidence of acute cholecystitis. Pancreas: Unremarkable. No pancreatic ductal dilatation or surrounding inflammatory changes. Spleen: Normal in size without focal abnormality. Adrenals/Urinary Tract: Adrenal glands are unremarkable. Kidneys are normal, without renal calculi, focal lesion, or hydronephrosis. Renal cyst noted. Bladder is decompressed and therefore poorly visualized.  Stomach/Bowel: Persistent marked gas and fluid distension of small bowel loops throughout the abdomen without identifiable transitional point. The findings are stable to worsened from the last study dated November 21st 2022. The previously noted free gas in the right upper quadrant has improved. Vascular/Lymphatic: No significant vascular findings are present. No enlarged abdominal or pelvic lymph nodes. Reproductive: Prostate is unremarkable. Other: No abdominal wall hernia or abnormality. Stable postsurgical changes. Musculoskeletal: No acute or significant osseous findings. IMPRESSION: 1. Persistent marked gas and fluid distension of small bowel loops throughout the abdomen without identifiable transitional point. The findings are stable to worsened from the last study dated November 21st 2022. 2. Interval improvement in the previously noted free gas in the right upper quadrant. 3. Stable liver and renal cysts. 4. Small amount of layering sludge in the gallbladder. No CT evidence of acute cholecystitis. Electronically Signed   By: Ted Mcalpine M.D.   On: 09/02/2021 19:22   CT ABDOMEN PELVIS W CONTRAST  Result Date: 08/21/2021 CLINICAL DATA:  Abdominal distension. Postoperative ileus, 12 days postop. EXAM: CT ABDOMEN AND PELVIS WITH CONTRAST TECHNIQUE: Multidetector CT imaging of the abdomen and pelvis was performed using the standard protocol following bolus administration of intravenous contrast. CONTRAST:  OMNIPAQUE IOHEXOL 300 MG/ML  SOLN COMPARISON:  CT AP, 08/03/2021 and 02/22/2019. FINDINGS: Lower chest: Trace LEFT pleural effusion. Minimal bibasilar atelectasis. Miniscule RIGHT basilar pneumothorax. See key image. Hepatobiliary: Multiple hepatic cysts, largest within RIGHT hepatic lobe measuring up to 3.4 cm. Mildly distended gallbladder with radiodense layering debris/stones. No gallbladder wall thickening, or biliary dilatation. Pancreas: Unremarkable. No pancreatic ductal dilatation  or  surrounding inflammatory changes. Spleen: Normal in size without focal abnormality. Adrenals/Urinary Tract: Adrenal glands are unremarkable. Mild RIGHT hydronephrosis. RIGHT midpole renal cyst. Additional bilateral subcentimeter hypodense renal lesions are too small to adequately characterize though likely small cysts. No renal calculi. Bladder is decompressed and unremarkable. Stomach/Bowel: *Enteric feeding tube, with tip within stomach. Stomach is within normal limits. *Multiple air-and-fluid distended small bowel loops without discrete transition. Findings appear progressed since 08/03/2021 comparison. *No CT findings of vascular compromise to bowel. *Small volume intraperitoneal air, best seen at the RIGHT upper quadrant. No additional findings to suggest perforation. *Appendix is not definitively visualized. RIGHT lower quadrant staple line. *Nondilated colon. Vascular/Lymphatic: Ectatic LEFT common iliac artery, measuring up to 1.7 cm. No enlarged abdominopelvic lymph nodes. Reproductive: Prostate is unremarkable. Other: Laparotomy scar with overlying staples. No abdominal wall hernia. No abdominopelvic ascites. Musculoskeletal: No acute osseous findings. IMPRESSION: 1. Multiple air and fluid distended small bowel loops without discrete transition. No evidence of vascular compromise of bowel. Findings appear progressed since 08/03/2021 comparison and are consistent with ileus, though small-bowel obstruction could appear similar. 2. Small volume intra-abdominal gas, at the RIGHT upper quadrant is favored postoperative. No additional findings to suggest bowel perforation. 3. Incidental and senescent findings, as above. Electronically Signed   By: Roanna Banning M.D.   On: 08/21/2021 13:18   DG Abd 2 Views  Result Date: 08/19/2021 CLINICAL DATA:  Nausea and vomiting for several days, initial encounter EXAM: ABDOMEN - 2 VIEW COMPARISON:  08/16/2021 FINDINGS: Postsurgical changes are again noted. Scattered  bowel gas is noted. A few mildly dilated loops of small bowel are noted with air-fluid levels. Minimal free air is noted related to the prior surgery. No new focal abnormality is noted. IMPRESSION: Postoperative free air. Mild small bowel dilatation. Electronically Signed   By: Alcide Clever M.D.   On: 08/19/2021 19:26   DG Abd Portable 1V  Result Date: 09/05/2021 CLINICAL DATA:  Postoperative ileus. EXAM: PORTABLE ABDOMEN - 1 VIEW COMPARISON:  September 01, 2021. FINDINGS: Slightly improved gaseous distention of large and small bowel is noted suggesting slightly improved ileus. No radio-opaque calculi or other significant radiographic abnormality are seen. IMPRESSION: Probable slightly improved ileus. Electronically Signed   By: Lupita Raider M.D.   On: 09/05/2021 10:44   DG Abd Portable 1V  Result Date: 09/01/2021 CLINICAL DATA:  Ileus, abdominal pain. Exploratory laparotomy on 08/09/2021 for bowel obstruction. EXAM: PORTABLE ABDOMEN - 1 VIEW COMPARISON:  Abdominal radiograph 08/27/2021 FINDINGS: Single supine image does not include the entirety of the upper abdomen or right lateral abdomen. Interval removal of enteric tube. Multiple air-filled dilated loops of small bowel and colon. No residual oral contrast. No free intraperitoneal air given the limits of this single supine image. IMPRESSION: Interval removal of the enteric tube with increased gaseous dilation of the small bowel and colon, consistent with recurrent ileus. No residual oral contrast. Electronically Signed   By: Sherron Ales M.D.   On: 09/01/2021 11:16   DG Abd Portable 1V  Result Date: 08/27/2021 CLINICAL DATA:  Abdominal distension EXAM: PORTABLE ABDOMEN - 1 VIEW COMPARISON:  Portable exam 0949 hours compared to 08/25/2021 FINDINGS: Tip of nasogastric tube projects over mid stomach. Retained contrast scattered throughout colon to rectum. Few distended small bowel loops in the LEFT mid abdomen are again seen. No bowel wall thickening  or acute osseous findings. IMPRESSION: Persistent mildly distended nonspecific small bowel loops in the LEFT mid abdomen, though GI contrast has progressed  to the rectum without definite evidence of obstruction. Electronically Signed   By: Ulyses Southward M.D.   On: 08/27/2021 14:07   DG Abd Portable 1V  Result Date: 08/25/2021 CLINICAL DATA:  Ileus, 24 hour delay EXAM: PORTABLE ABDOMEN - 1 VIEW COMPARISON:  KUB 08/23/2021 FINDINGS: Enteric catheter is partially imaged with the tip projecting over the stomach. Gaseous distention of small bowel in the left hemiabdomen is similar to the prior study, with loops measuring up to 5.0 cm. Enteric contrast is seen in the right colon. There is no abnormal soft tissue calcification. There is no acute osseous abnormality. IMPRESSION: 1. Enteric contrast now seen in the colon. 2. No significant interval change in gaseous distention of small bowel in the left hemiabdomen. Electronically Signed   By: Lesia Hausen M.D.   On: 08/25/2021 08:13   DG Abd Portable 1V-Small Bowel Obstruction Protocol-initial, 8 hr delay  Result Date: 08/24/2021 CLINICAL DATA:  Small-bowel obstruction, 8 hour follow-up film EXAM: PORTABLE ABDOMEN - 1 VIEW COMPARISON:  08/23/2021 FINDINGS: Gastric catheter is seen in the stomach. Scattered large and small bowel gas is noted. Previously administered contrast now lies within the dilated small bowel loops. No colonic contrast is seen. 24 hour film is recommended. IMPRESSION: Contrast scattered throughout the small bowel. 24 hour follow-up film is recommended. Electronically Signed   By: Alcide Clever M.D.   On: 08/24/2021 00:09   DG Abd Portable 1V  Result Date: 08/23/2021 CLINICAL DATA:  History of small-bowel obstruction EXAM: PORTABLE ABDOMEN - 1 VIEW COMPARISON:  KUB 08/19/2021 FINDINGS: The enteric catheter has been removed. There is gaseous distention of small bowel in the left hemiabdomen with loops measuring up to 5.2 cm, increased since  the prior study. There is no definite free intraperitoneal air, suboptimally assessed given supine technique. There is no abnormal soft tissue calcification. Surgical material in the right hemiabdomen is again noted. The bones are unremarkable. IMPRESSION: Increased gaseous distention of the small bowel in the left hemiabdomen may reflect developing obstruction or ileus. Electronically Signed   By: Lesia Hausen M.D.   On: 08/23/2021 08:48   DG Abd Portable 1V  Result Date: 08/19/2021 CLINICAL DATA:  Check gastric catheter placement EXAM: PORTABLE ABDOMEN - 1 VIEW COMPARISON:  Film from earlier in the same day. FINDINGS: Gastric catheter is noted within the stomach. Scattered large and small bowel gas is noted. Postsurgical changes are seen. IMPRESSION: Gastric catheter within the stomach. Electronically Signed   By: Alcide Clever M.D.   On: 08/19/2021 19:24   DG Abd Portable 1V  Result Date: 08/16/2021 CLINICAL DATA:  Nausea and vomiting today.  Ileus following surgery. EXAM: PORTABLE ABDOMEN - 1 VIEW COMPARISON:  08/09/2021. FINDINGS: Mildly dilated small bowel, most apparent in the left mid abdomen. There is residual contrast within a normal caliber right colon. Skin staples lie along the midline of the central to lower abdomen. Stomach is moderate to markedly distended. Skeletal structures are grossly intact. IMPRESSION: 1. Mild dilation of small bowel consistent with a postsurgical adynamic ileus, but with partial small bowel obstruction not excluded. There is left small-bowel dilation than was noted on the exam dated 08/09/2021, prior to surgery. Electronically Signed   By: Amie Portland M.D.   On: 08/16/2021 13:28   [place] EKG SITE RITE  Result Date: 08/10/2021 If Site Rite image not attached, placement could not be confirmed due to current cardiac rhythm.    Today   Subjective    Arther Dames today  has no headache,no chest abdominal pain,no new weakness tingling or numbness, feels much  better wants to go home today.     Objective   Blood pressure 118/74, pulse 67, temperature 98.1 F (36.7 C), temperature source Oral, resp. rate 16, height  (1.854 m), weight 63.6 kg, SpO2 100 %.   Intake/Output Summary (Last 24 hours) at 09/08/2021 0839 Last data filed at 09/08/2021 0500 Gross per 24 hour  Intake 240 ml  Output --  Net 240 ml    Exam  Awake Alert, No new F.N deficits, Normal affect [place].AT,PERRAL Supple Neck,No JVD, No cervical lymphadenopathy appriciated.  Symmetrical Chest wall movement, Good air movement bilaterally, CTAB RRR,No Gallops,Rubs or new Murmurs, No Parasternal Heave +ve B.Sounds, Abd Soft, Non tender, Abd incision site clean and healing well No Cyanosis, Clubbing or edema, No new Rash or bruise   Data Review   CBC w Diff:  Lab Results  Component Value Date   WBC 3.1 (L) 09/08/2021   HGB 11.1 (L) 09/08/2021   HGB 13.2 09/22/2015   HCT 32.7 (L) 09/08/2021   HCT 39.6 09/22/2015   PLT 224 09/08/2021   PLT 240 09/22/2015   LYMPHOPCT 31 08/06/2021   MONOPCT 11 08/06/2021   EOSPCT 0 08/06/2021   BASOPCT 0 08/06/2021    CMP:  Lab Results  Component Value Date   NA 134 (L) 09/08/2021   NA 141 11/15/2017   K 4.1 09/08/2021   CL 101 09/08/2021   CO2 26 09/08/2021   BUN 17 09/08/2021   BUN 13 11/15/2017   CREATININE 0.84 09/08/2021   CREATININE 0.84 09/28/2020   PROT 5.8 (L) 09/08/2021   PROT 6.5 09/22/2015   ALBUMIN 2.8 (L) 09/08/2021   ALBUMIN 3.9 09/22/2015   BILITOT 0.6 09/08/2021   BILITOT 0.8 09/22/2015   ALKPHOS 47 09/08/2021   AST 16 09/08/2021   ALT 21 09/08/2021  .   Total Time in preparing paper work, data evaluation and todays exam - 35 minutes  Susa Raring M.D on 09/08/2021 at 8:39 AM  [facility]

## 2021-09-08 NOTE — Care Management Important Message (Signed)
Important Message  Patient Details  Name: ALLON COSTLOW MRN: [id number] Date of Birth: 03-24-1951   Medicare Important Message Given:  Yes     Sherilyn Banker 09/08/2021, 1:56 PM

## 2021-09-08 NOTE — Progress Notes (Signed)
Progress Note  30 Days Post-Op  Subjective: Pt doing well this AM. Tolerating diet and denies n/v. Having bowel movements. He is excited to go home today, discussed post-op care with patient and patient's aunt yesterday.   Objective: Vital signs in last 24 hours: Temp:  [97.8 F (36.6 C)-98.1 F (36.7 C)] 98.1 F (36.7 C) (12/09 0405) Pulse Rate:  [64-75] 64 (12/09 0405) Resp:  [18] 18 (12/09 0405) BP: (106-121)/(67-69) 106/68 (12/09 0405) SpO2:  [99 %-100 %] 100 % (12/09 0405) Weight:  [63.6 kg] 63.6 kg (12/09 0500) Last BM Date: 09/07/21  Intake/Output from previous day: 12/08 0701 - 12/09 0700 In: 240 [P.O.:240] Out: -  Intake/Output this shift: No intake/output data recorded.  PE: General: pleasant, WD, WN male who is laying in bed in NAD Heart: regular, rate, and rhythm.  Lungs: Respiratory effort nonlabored Abd: soft, NT, ND, +BS, incision well healed  MS: all 4 extremities are symmetrical with no cyanosis, clubbing, or edema. Skin: warm and dry with no masses, lesions, or rashes   Lab Results:  Recent Labs    09/07/21 0418 09/08/21 0213  WBC 3.0* 3.1*  HGB 10.8* 11.1*  HCT 32.9* 32.7*  PLT 200 224   BMET Recent Labs    09/07/21 0418 09/08/21 0213  NA 136 134*  K 4.0 4.1  CL 103 101  CO2 25 26  GLUCOSE 77 94  BUN 17 17  CREATININE 0.65  0.63 0.84  CALCIUM 8.7* 8.6*   PT/INR No results for input(s): LABPROT, INR in the last 72 hours. CMP     Component Value Date/Time   NA 134 (L) 09/08/2021 0213   NA 141 11/15/2017 1054   K 4.1 09/08/2021 0213   CL 101 09/08/2021 0213   CO2 26 09/08/2021 0213   GLUCOSE 94 09/08/2021 0213   BUN 17 09/08/2021 0213   BUN 13 11/15/2017 1054   CREATININE 0.84 09/08/2021 0213   CREATININE 0.84 09/28/2020 0956   CALCIUM 8.6 (L) 09/08/2021 0213   PROT 5.8 (L) 09/08/2021 0213   PROT 6.5 09/22/2015 0949   ALBUMIN 2.8 (L) 09/08/2021 0213   ALBUMIN 3.9 09/22/2015 0949   AST 16 09/08/2021 0213   ALT 21  09/08/2021 0213   ALKPHOS 47 09/08/2021 0213   BILITOT 0.6 09/08/2021 0213   BILITOT 0.8 09/22/2015 0949   GFRNONAA >60 09/08/2021 0213   GFRNONAA 90 09/28/2020 0956   GFRAA 104 09/28/2020 0956   Lipase     Component Value Date/Time   LIPASE 24 08/03/2021 1045       Studies/Results: No results found.  Anti-infectives: Anti-infectives (From admission, onward)    Start     Dose/Rate Route Frequency Ordered Stop   08/09/21 1000  ceFAZolin (ANCEF) IVPB 2g/100 mL premix  Status:  Discontinued        2 g 200 mL/hr over 30 Minutes Intravenous To Short Stay 08/09/21 0758 08/10/21 1000   08/04/21 2200  oseltamivir (TAMIFLU) capsule 30 mg        30 mg Oral 2 times daily 08/04/21 0822 08/09/21 0959   08/04/21 1000  oseltamivir (TAMIFLU) capsule 75 mg        75 mg Oral  Once 08/04/21 0825 08/04/21 1116   08/03/21 1645  cefTRIAXone (ROCEPHIN) 2 g in sodium chloride 0.9 % 100 mL IVPB  Status:  Discontinued        2 g 200 mL/hr over 30 Minutes Intravenous Every 24 hours 08/03/21 1631 08/06/21 1519  08/03/21 1645  metroNIDAZOLE (FLAGYL) IVPB 500 mg  Status:  Discontinued        500 mg 100 mL/hr over 60 Minutes Intravenous Every 12 hours 08/03/21 1631 08/06/21 1519        Assessment/Plan SBO POD 30 s/p ex lap LOA by Dr. Janee Morn 11/9 with findings of dense adhesion near distal ileum down to the mesentery - afebrile, VSS - NGT initially removed 11/13 but replaced 11/19 due to persistent ileus w/ intolerance of PO.  - CT repeated 11/21 without abscess or definite transition zone, dilated loops of small bowel with air fluid levels consistent with ileus. - SBO protocol started 11/23; KUB 11/25 showed contrast in Rt colon, KUB 11/27 w/ contrast in rectum but persistently dilated small bowel . - NG removed again 11/30, had vomiting 12/1. Backed off to clears 12/2 .  - CT A/P repeated 12/3 - ongoing diffuse dilation of the small bowel without transition zone. This is consistent with ileus,  not obstruction. Clinically patient appears to be improving. TPN 1/2 12/6, now off - PICC removed yesterday - mobilize/OOB  - advanced to soft diet 12/6 and tolerating - stable for discharge from a surgical standpoint. Follow up and instructions in AVS and I spoke with his aunt on the phone today.      FEN - soft diet ID - ancef periop VTE - SCDs, lovenox Foley - placed periop 11/9, d/c 11/10   AKI - resolved HTN HLD COPD CHF   LOS: 35 days    Juliet Rude, [facility] 09/08/2021, 8:16 AM Please see Amion for pager number during day hours 7:00am-4:30pm

## 2021-09-08 NOTE — Progress Notes (Signed)
Arther Dames to be D/C'd Home per MD order.  Discussed with the patient and all questions fully answered.   VSS, Skin clean, dry and intact without evidence of skin break down, no evidence of skin tears noted. IV catheter discontinued intact. Site without signs and symptoms of complications. Dressing and pressure applied.   An After Visit Summary was printed and given to the patient.    D/C education completed with patient/family including follow up instructions, medication list, d/c activities limitations if indicated, with other d/c instructions as indicated by MD - patient able to verbalize understanding, all questions fully answered.    Patient instructed to return to ED, call 911, or call MD for any changes in condition.    Patient escorted via WC, and D/C home via private car.

## 2021-09-09 DIAGNOSIS — I5022 Chronic systolic (congestive) heart failure: Secondary | ICD-10-CM | POA: Diagnosis not present

## 2021-09-09 DIAGNOSIS — Z48815 Encounter for surgical aftercare following surgery on the digestive system: Secondary | ICD-10-CM | POA: Diagnosis not present

## 2021-09-09 DIAGNOSIS — I110 Hypertensive heart disease with heart failure: Secondary | ICD-10-CM | POA: Diagnosis not present

## 2021-09-09 DIAGNOSIS — I700 Atherosclerosis of aorta: Secondary | ICD-10-CM | POA: Diagnosis not present

## 2021-09-09 DIAGNOSIS — R3912 Poor urinary stream: Secondary | ICD-10-CM | POA: Diagnosis not present

## 2021-09-09 DIAGNOSIS — N138 Other obstructive and reflux uropathy: Secondary | ICD-10-CM | POA: Diagnosis not present

## 2021-09-09 DIAGNOSIS — N401 Enlarged prostate with lower urinary tract symptoms: Secondary | ICD-10-CM | POA: Diagnosis not present

## 2021-09-09 DIAGNOSIS — R69 Illness, unspecified: Secondary | ICD-10-CM | POA: Diagnosis not present

## 2021-09-09 DIAGNOSIS — E43 Unspecified severe protein-calorie malnutrition: Secondary | ICD-10-CM | POA: Diagnosis not present

## 2021-09-11 ENCOUNTER — Other Ambulatory Visit: Payer: Self-pay

## 2021-09-11 ENCOUNTER — Encounter: Payer: Self-pay | Admitting: Family

## 2021-09-11 ENCOUNTER — Telehealth: Payer: Self-pay | Admitting: *Deleted

## 2021-09-11 ENCOUNTER — Ambulatory Visit (INDEPENDENT_AMBULATORY_CARE_PROVIDER_SITE_OTHER): Payer: Medicare HMO | Admitting: Family

## 2021-09-11 VITALS — BP 98/80 | HR 60 | Temp 96.9°F | Ht 73.0 in | Wt 149.2 lb

## 2021-09-11 DIAGNOSIS — I5022 Chronic systolic (congestive) heart failure: Secondary | ICD-10-CM

## 2021-09-11 DIAGNOSIS — Z9889 Other specified postprocedural states: Secondary | ICD-10-CM

## 2021-09-11 DIAGNOSIS — I10 Essential (primary) hypertension: Secondary | ICD-10-CM

## 2021-09-11 DIAGNOSIS — R2681 Unsteadiness on feet: Secondary | ICD-10-CM | POA: Diagnosis not present

## 2021-09-11 DIAGNOSIS — E785 Hyperlipidemia, unspecified: Secondary | ICD-10-CM | POA: Diagnosis not present

## 2021-09-11 MED ORDER — BLOOD PRESSURE MONITOR/L CUFF MISC: 1.0000 "application " | Freq: Every day | 0 refills | Status: AC
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
## 2021-09-11 NOTE — Telephone Encounter (Signed)
Transition Care Management Unsuccessful Follow-up Telephone Call  Date of discharge and from where:  09/08/2021 [facility]  Attempts:  1st Attempt  Reason for unsuccessful TCM follow-up call:  No answer/busy Patient already seen by Dinah today for Hospital Follow up at 11:30

## 2021-09-11 NOTE — Telephone Encounter (Signed)
Patient seen today for an appointment.  [name] brought me an order for a Shower Bench to use daily when showering due to Unsteady Gait to fax to [employer]   Faxed order to [employer] to [phone]

## 2021-09-12 ENCOUNTER — Other Ambulatory Visit: Payer: Self-pay | Admitting: *Deleted

## 2021-09-12 DIAGNOSIS — R69 Illness, unspecified: Secondary | ICD-10-CM | POA: Diagnosis not present

## 2021-09-12 DIAGNOSIS — N138 Other obstructive and reflux uropathy: Secondary | ICD-10-CM | POA: Diagnosis not present

## 2021-09-12 DIAGNOSIS — Z48815 Encounter for surgical aftercare following surgery on the digestive system: Secondary | ICD-10-CM | POA: Diagnosis not present

## 2021-09-12 DIAGNOSIS — I110 Hypertensive heart disease with heart failure: Secondary | ICD-10-CM | POA: Diagnosis not present

## 2021-09-12 DIAGNOSIS — I700 Atherosclerosis of aorta: Secondary | ICD-10-CM | POA: Diagnosis not present

## 2021-09-12 DIAGNOSIS — I5022 Chronic systolic (congestive) heart failure: Secondary | ICD-10-CM | POA: Diagnosis not present

## 2021-09-12 DIAGNOSIS — R3912 Poor urinary stream: Secondary | ICD-10-CM | POA: Diagnosis not present

## 2021-09-12 DIAGNOSIS — E43 Unspecified severe protein-calorie malnutrition: Secondary | ICD-10-CM | POA: Diagnosis not present

## 2021-09-12 DIAGNOSIS — N401 Enlarged prostate with lower urinary tract symptoms: Secondary | ICD-10-CM | POA: Diagnosis not present

## 2021-09-12 NOTE — Patient Outreach (Signed)
[facility]) Care Management Telephonic RN Care Manager Note   09/12/2021 Name:  [name] MRN:  [id number] DOB:  September 25, 1951  Summary: Initial post hospital assessment completed with [name], Patients's sister as he requested  She reports [name] is improving well  She reports [facility] PT evaluated patient and reports PT is not needed as patient is  doing well He is walking a lot  Pt was seen 09/11/21 by Christopher Haddock NP - pending shower chair  [name] is staying with his girlfriend, [name] at the hotel and is planning to go to [facility] for an overnight trip soon with her. [name] per [name] is caring and motivating [name] to remain active and to eat well. His appetite is better and he is eating what he likes.  Discuss why glucose values checked in the hospital, Reviewed the HgA1c of 6.1 and initial elevated glucose values in 200s that decreased to the 60s (importance of glucose monitoring)  Sleeping better and patient is an early riser   Discussed [name] previous active status with [facility] disease manager and pending February 2023 listed appointment with [name] when she inquired   Recommendations/Changes made from today's visit: Agreed with [name] on offerings of Ensure when his appetite decreases plus small meals as he gets full easily   Subjective: [name] is an 70 y.o. year old male who is a primary patient of Christopher Seller, NP. The care management team was consulted for assistance with care management and/or care coordination needs.    Telephonic RN Care Manager completed Telephone Visit today.   Objective:  Medications Reviewed Today     Reviewed by Elveria Royals, CMA (Certified Medical Assistant) on 09/11/21 at 1157  Med List Status: <None>   Medication Order Taking? Sig Documenting Provider Last Dose Status Informant  acetaminophen (TYLENOL) 325 MG tablet [id number] Yes Take 2 tablets (650 mg total) by mouth every 6 (six) hours as needed for  moderate pain or mild pain (headache). Juliet Rude, PA-C Taking Active   aspirin EC 81 MG tablet [id number] Yes Take 1 tablet (81 mg total) by mouth daily. Alver Sorrow, NP Taking Active Self  carvedilol (COREG) 3.125 MG tablet [id number] Yes Take 1 tablet (3.125 mg total) by mouth 2 (two) times daily. Leroy Sea, MD Taking Active   docusate sodium (COLACE) 100 MG capsule [id number] Yes Take 1 capsule (100 mg total) by mouth daily. Leroy Sea, MD Taking Active   Glycerin, PF, (CLEAR EYES PURE RELIEF PF) 0.25 % SOLN [id number] Yes Place 1-2 drops into the left eye as needed (for redness). [provider] Taking Active Self  polycarbophil (FIBERCON) 625 MG tablet [id number] Yes Take 1 tablet (625 mg total) by mouth daily. Juliet Rude, PA-C Taking Active   rosuvastatin (CRESTOR) 5 MG tablet [id number] Yes TAKE 1 TABLET(5 MG) BY MOUTH DAILY Christopher Seller, NP Taking Active Self           Med Note [name], EVIE   Mon Sep 11, 2021 11:55 AM)    traZODone (DESYREL) 100 MG tablet [id number] Yes TAKE 1 TABLET(100 MG) BY MOUTH AT BEDTIME AS NEEDED FOR SLEEP Christopher Seller, NP Taking Active Self           Med Note [name], EVIE   Mon Sep 11, 2021 11:56 AM)               SDOH:  (Social Determinants of Health) assessments and  interventions performed:    Care Plan  Review of patient past medical history, allergies, medications, health status, including review of consultants reports, laboratory and other test data, was performed as part of comprehensive evaluation for care management services.   There are no care plans that you recently modified to display for this patient.    Plan: The patient has been provided with contact information for the care management team and has been advised to call with any health related questions or concerns.  The care management team will reach out to the patient again over the next 14-20 business days.  Len Kluver L. Noelle Penner, RN, BSN,  CCM [facility] Telephonic Care Management Care Coordinator Office number [phone] Main [facility] number [phone] Fax number [phone]

## 2021-09-13 ENCOUNTER — Other Ambulatory Visit: Payer: Medicare HMO

## 2021-09-14 ENCOUNTER — Other Ambulatory Visit: Payer: Self-pay | Admitting: Family

## 2021-09-14 DIAGNOSIS — I5022 Chronic systolic (congestive) heart failure: Secondary | ICD-10-CM

## 2021-09-14 NOTE — Telephone Encounter (Signed)
Received fax from [employer] stating that they received the order for the Shower Bench but needs supporting OV notes faxed.  Faxed last OV notes to [phone]

## 2021-09-15 ENCOUNTER — Other Ambulatory Visit: Payer: Medicare HMO

## 2021-09-18 ENCOUNTER — Other Ambulatory Visit: Payer: Self-pay

## 2021-09-18 ENCOUNTER — Other Ambulatory Visit: Payer: Medicare HMO

## 2021-09-18 DIAGNOSIS — I10 Essential (primary) hypertension: Secondary | ICD-10-CM | POA: Diagnosis not present

## 2021-09-18 DIAGNOSIS — E785 Hyperlipidemia, unspecified: Secondary | ICD-10-CM | POA: Diagnosis not present

## 2021-09-19 DIAGNOSIS — I700 Atherosclerosis of aorta: Secondary | ICD-10-CM | POA: Diagnosis not present

## 2021-09-19 DIAGNOSIS — E43 Unspecified severe protein-calorie malnutrition: Secondary | ICD-10-CM | POA: Diagnosis not present

## 2021-09-19 DIAGNOSIS — Z48815 Encounter for surgical aftercare following surgery on the digestive system: Secondary | ICD-10-CM | POA: Diagnosis not present

## 2021-09-19 DIAGNOSIS — I5022 Chronic systolic (congestive) heart failure: Secondary | ICD-10-CM | POA: Diagnosis not present

## 2021-09-19 DIAGNOSIS — R3912 Poor urinary stream: Secondary | ICD-10-CM | POA: Diagnosis not present

## 2021-09-19 DIAGNOSIS — I110 Hypertensive heart disease with heart failure: Secondary | ICD-10-CM | POA: Diagnosis not present

## 2021-09-19 DIAGNOSIS — N401 Enlarged prostate with lower urinary tract symptoms: Secondary | ICD-10-CM | POA: Diagnosis not present

## 2021-09-19 DIAGNOSIS — N138 Other obstructive and reflux uropathy: Secondary | ICD-10-CM | POA: Diagnosis not present

## 2021-09-19 DIAGNOSIS — R69 Illness, unspecified: Secondary | ICD-10-CM | POA: Diagnosis not present

## 2021-09-19 LAB — COMPLETE METABOLIC PANEL WITH GFR
AG Ratio: 1.3 (calc) (ref 1.0–2.5)
ALT: 14 U/L (ref 9–46)
AST: 13 U/L (ref 10–35)
Albumin: 3.6 g/dL (ref 3.6–5.1)
Alkaline phosphatase (APISO): 38 U/L (ref 35–144)
BUN: 9 mg/dL (ref 7–25)
CO2: 28 mmol/L (ref 20–32)
Calcium: 9.1 mg/dL (ref 8.6–10.3)
Chloride: 107 mmol/L (ref 98–110)
Creat: 0.7 mg/dL (ref 0.70–1.35)
Globulin: 2.8 g/dL (calc) (ref 1.9–3.7)
Glucose, Bld: 97 mg/dL (ref 65–99)
Potassium: 3.7 mmol/L (ref 3.5–5.3)
Sodium: 143 mmol/L (ref 135–146)
Total Bilirubin: 0.4 mg/dL (ref 0.2–1.2)
Total Protein: 6.4 g/dL (ref 6.1–8.1)
eGFR: 100 mL/min/{1.73_m2} (ref >=60)

## 2021-09-19 LAB — CBC WITH DIFFERENTIAL/PLATELET
Absolute Monocytes: 297 cells/uL (ref 200–950)
Basophils Absolute: 9 cells/uL (ref 0–200)
Basophils Relative: 0.3 %
Eosinophils Absolute: 129 cells/uL (ref 15–500)
Eosinophils Relative: 4.3 %
HCT: 34.7 % — ABNORMAL LOW (ref 38.5–50.0)
Hemoglobin: 11.5 g/dL — ABNORMAL LOW (ref 13.2–17.1)
Lymphs Abs: 1386 cells/uL (ref 850–3900)
MCH: 31.9 pg (ref 27.0–33.0)
MCHC: 33.1 g/dL (ref 32.0–36.0)
MCV: 96.1 fL (ref 80.0–100.0)
MPV: 10.9 fL (ref 7.5–12.5)
Monocytes Relative: 9.9 %
Neutro Abs: 1179 cells/uL — ABNORMAL LOW (ref 1500–7800)
Neutrophils Relative %: 39.3 %
Platelets: 317 10*3/uL (ref 140–400)
RBC: 3.61 10*6/uL — ABNORMAL LOW (ref 4.20–5.80)
RDW: 12.6 % (ref 11.0–15.0)
Total Lymphocyte: 46.2 %
WBC: 3 10*3/uL — ABNORMAL LOW (ref 3.8–10.8)

## 2021-09-19 LAB — LIPID PANEL
Cholesterol: 137 mg/dL (ref <200)
HDL: 46 mg/dL (ref >=40)
LDL Cholesterol (Calc): 77 mg/dL (calc)
Non-HDL Cholesterol (Calc): 91 mg/dL (calc) (ref <130)
Total CHOL/HDL Ratio: 3 (calc) (ref <5.0)
Triglycerides: 67 mg/dL (ref <150)

## 2021-09-19 LAB — TSH: TSH: 2.05 mIU/L (ref 0.40–4.50)

## 2021-09-20 ENCOUNTER — Other Ambulatory Visit: Payer: Self-pay

## 2021-09-20 MED ORDER — ASCORBIC ACID 500 MG PO TABS: 500.0000 mg | ORAL_TABLET | Freq: Every day | ORAL | 11 refills | Status: DC

## 2021-09-21 DIAGNOSIS — I5022 Chronic systolic (congestive) heart failure: Secondary | ICD-10-CM | POA: Diagnosis not present

## 2021-09-21 DIAGNOSIS — N138 Other obstructive and reflux uropathy: Secondary | ICD-10-CM | POA: Diagnosis not present

## 2021-09-21 DIAGNOSIS — R3912 Poor urinary stream: Secondary | ICD-10-CM | POA: Diagnosis not present

## 2021-09-21 DIAGNOSIS — E43 Unspecified severe protein-calorie malnutrition: Secondary | ICD-10-CM | POA: Diagnosis not present

## 2021-09-21 DIAGNOSIS — I700 Atherosclerosis of aorta: Secondary | ICD-10-CM | POA: Diagnosis not present

## 2021-09-21 DIAGNOSIS — I110 Hypertensive heart disease with heart failure: Secondary | ICD-10-CM | POA: Diagnosis not present

## 2021-09-21 DIAGNOSIS — R69 Illness, unspecified: Secondary | ICD-10-CM | POA: Diagnosis not present

## 2021-09-21 DIAGNOSIS — Z48815 Encounter for surgical aftercare following surgery on the digestive system: Secondary | ICD-10-CM | POA: Diagnosis not present

## 2021-09-21 DIAGNOSIS — N401 Enlarged prostate with lower urinary tract symptoms: Secondary | ICD-10-CM | POA: Diagnosis not present

## 2021-09-26 ENCOUNTER — Other Ambulatory Visit: Payer: Self-pay | Admitting: *Deleted

## 2021-09-26 ENCOUNTER — Other Ambulatory Visit: Payer: Self-pay

## 2021-09-26 NOTE — Patient Outreach (Signed)
[facility]) Care Management Telephonic RN Care Manager Note   09/26/2021 Name:  [name] MRN:  [id number] DOB:  December 11, 1950  Summary: RN CM spoke briefly with [name] He reports he had a good holiday and did go out of town and return with girlfriend,[name] He reports no concerns but does report he is "a little broke" but has food and medicines He requests a reminder call for his 10/04/21 follow up appointment with his surgeon He again confirms [facility] staff is to reach out to his aunt [name] to his aunt Mrs [name] unsuccessful  No answer. [facility] RN CM left HIPAA [facility] Portability and Accountability Act) compliant voicemail message along with CM's contact info at the preferred number indicated in EPIC    Recommendations/Changes made from today's visit: none   Subjective: AMI [name] is an 70 y.o. year old male who is a primary patient of Sharon Seller, NP. The care management team was consulted for assistance with care management and/or care coordination needs.    Telephonic RN Care Manager completed Telephone Visit today.   Objective:  Medications Reviewed Today     Reviewed by Elveria Royals, CMA (Certified Medical Assistant) on 09/11/21 at 1157  Med List Status: <None>   Medication Order Taking? Sig Documenting Provider Last Dose Status Informant  acetaminophen (TYLENOL) 325 MG tablet [id number] Yes Take 2 tablets (650 mg total) by mouth every 6 (six) hours as needed for moderate pain or mild pain (headache). Juliet Rude, PA-C Taking Active   aspirin EC 81 MG tablet [id number] Yes Take 1 tablet (81 mg total) by mouth daily. Alver Sorrow, NP Taking Active Self  carvedilol (COREG) 3.125 MG tablet [id number] Yes Take 1 tablet (3.125 mg total) by mouth 2 (two) times daily. Leroy Sea, MD Taking Active   docusate sodium (COLACE) 100 MG capsule [id number] Yes Take 1 capsule (100 mg total) by mouth daily. Leroy Sea,  MD Taking Active   Glycerin, PF, (CLEAR EYES PURE RELIEF PF) 0.25 % SOLN [id number] Yes Place 1-2 drops into the left eye as needed (for redness). [provider] Taking Active Self  polycarbophil (FIBERCON) 625 MG tablet [id number] Yes Take 1 tablet (625 mg total) by mouth daily. Juliet Rude, PA-C Taking Active   rosuvastatin (CRESTOR) 5 MG tablet [id number] Yes TAKE 1 TABLET(5 MG) BY MOUTH DAILY Sharon Seller, NP Taking Active Self           Med Note [name], EVIE   Mon Sep 11, 2021 11:55 AM)    traZODone (DESYREL) 100 MG tablet [id number] Yes TAKE 1 TABLET(100 MG) BY MOUTH AT BEDTIME AS NEEDED FOR SLEEP Sharon Seller, NP Taking Active Self           Med Note [name] Sep 11, 2021 11:56 AM)             Patient Active Problem List   Diagnosis Date Noted   SBO (small bowel obstruction) (HCC) 08/12/2021   Aortic atherosclerosis (HCC) 08/12/2021   Protein-calorie malnutrition, severe 08/10/2021   Enteritis 08/03/2021   BPH with obstruction/lower urinary tract symptoms 05/16/2021   Wrist pain, acute, left 03/21/2021   Lumbar radiculopathy, right 01/03/2018   Right hip pain 12/03/2017   Benign prostatic hyperplasia with weak urinary stream 06/07/2017   Essential hypertension 04/26/2017   Seasonal allergies 05/25/2016   Severe major depression, single episode, without psychotic features (HCC) 03/04/2016  Alcohol use disorder, severe, dependence (HCC) 03/04/2016   Cocaine use disorder, moderate, dependence (HCC) 03/04/2016   NICM (nonischemic cardiomyopathy) (HCC) 01/23/2016   Chronic systolic CHF (congestive heart failure) (HCC) 01/23/2016   Epigastric abdominal pain 08/20/2011   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   Diarrhea 06/13/2011   DERMATOPHYTOSIS OF SCALP AND BEARD 03/18/2008   TOBACCO USER 01/02/2008   Alcohol abuse 11/28/2006     SDOH:  (Social Determinants of Health) assessments and interventions performed:    Care Plan  Review of  patient past medical history, allergies, medications, health status, including review of consultants reports, laboratory and other test data, was performed as part of comprehensive evaluation for care management services.   There are no care plans that you recently modified to display for this patient.    Plan: The patient has been provided with contact information for the care management team and has been advised to call with any health related questions or concerns.  The care management team will reach out to the patient again over the next 7-14 days.  Auryn Paige L. Noelle Penner, RN, BSN, CCM [facility] Telephonic Care Management Care Coordinator Office number [phone] Main [facility] number [phone] Fax number [phone]

## 2021-09-27 DIAGNOSIS — E43 Unspecified severe protein-calorie malnutrition: Secondary | ICD-10-CM | POA: Diagnosis not present

## 2021-09-27 DIAGNOSIS — R3912 Poor urinary stream: Secondary | ICD-10-CM | POA: Diagnosis not present

## 2021-09-27 DIAGNOSIS — I700 Atherosclerosis of aorta: Secondary | ICD-10-CM | POA: Diagnosis not present

## 2021-09-27 DIAGNOSIS — I110 Hypertensive heart disease with heart failure: Secondary | ICD-10-CM | POA: Diagnosis not present

## 2021-09-27 DIAGNOSIS — N401 Enlarged prostate with lower urinary tract symptoms: Secondary | ICD-10-CM | POA: Diagnosis not present

## 2021-09-27 DIAGNOSIS — R69 Illness, unspecified: Secondary | ICD-10-CM | POA: Diagnosis not present

## 2021-09-27 DIAGNOSIS — Z48815 Encounter for surgical aftercare following surgery on the digestive system: Secondary | ICD-10-CM | POA: Diagnosis not present

## 2021-09-27 DIAGNOSIS — N138 Other obstructive and reflux uropathy: Secondary | ICD-10-CM | POA: Diagnosis not present

## 2021-09-27 DIAGNOSIS — I5022 Chronic systolic (congestive) heart failure: Secondary | ICD-10-CM | POA: Diagnosis not present

## 2021-10-03 ENCOUNTER — Other Ambulatory Visit: Payer: Self-pay | Admitting: *Deleted

## 2021-10-03 ENCOUNTER — Other Ambulatory Visit: Payer: Self-pay

## 2021-10-03 NOTE — Patient Outreach (Signed)
[facility]) Care Management Telephonic RN Care Manager Note   10/03/2021 Name:  [name] MRN:  [id number] DOB:  [birth year]  Summary: Outreach to patient to remind him as discussed previously of his 10/04/21 1010 [place] surgery appointment in [place] He was notified that RN CM outreached to his aunt after speaking with him last but she did not answer and a voice message was left No return call received from aunt since message left He reports he is doing well so far this new year and denies any worsening symptoms or needs  Outreach to his aunt Stanton Kidney to update her of his upcoming surgical appointment on 10/04/20 at 1010. 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 His supervisor has been consulted to adjust his job duties  Transition of care services noted to be completed by primary care MD office staff-[facility] - Dr Sherrie Mustache Transition of Care will be completed by primary care provider office who will refer to [facility] care management if needed.  Recommendations/Changes made from today's visit: Encouraged attendance to surgical follow up appointment   Subjective: [name] is an 71 y.o. year old male who is a primary patient of Lauree Chandler, NP. The care management team was consulted for assistance with care management and/or care coordination needs.    Telephonic RN Care Manager completed Telephone Visit today.   Objective:  Medications Reviewed  Today     Reviewed by Carroll Kinds, CMA (Certified Medical Assistant) on 09/11/21 at 1157  Med List Status: <None>   Medication Order Taking? Sig Documenting Provider Last Dose Status Informant  acetaminophen (TYLENOL) 325 MG tablet [id number] Yes Take 2 tablets (650 mg total) by mouth every 6 (six) hours as needed for moderate pain or mild pain (headache). Norm Parcel, PA-C Taking Active   aspirin EC 81 MG tablet [id number] Yes Take 1 tablet (81 mg total) by mouth daily. Loel Dubonnet, NP Taking Active Self  carvedilol (COREG) 3.125 MG tablet [id number] Yes Take 1 tablet (3.125 mg total) by mouth 2 (two) times daily. Thurnell Lose, MD Taking Active   docusate sodium (COLACE) 100 MG capsule [id number] Yes Take 1 capsule (100 mg total) by mouth daily. Thurnell Lose, MD Taking Active   Glycerin, PF, (CLEAR EYES PURE RELIEF PF) 0.25 % SOLN [id number] Yes Place 1-2 drops into the left eye as needed (for redness). [provider] Taking Active Self  polycarbophil (FIBERCON) 625 MG tablet [id number] Yes Take 1 tablet (625 mg total) by mouth daily. Norm Parcel, PA-C Taking Active   rosuvastatin (CRESTOR) 5 MG tablet [id number] Yes TAKE 1 TABLET(5 MG) BY MOUTH DAILY Lauree Chandler, NP Taking Active Self           Med Note Ronnald Ramp, EVIE   Mon Sep 11, 2021 11:55 AM)    traZODone (DESYREL) 100 MG tablet [id number] Yes TAKE 1 TABLET(100 MG) BY MOUTH AT BEDTIME AS NEEDED FOR SLEEP Dewaine Oats Carlos American, NP Taking  Active Self           Med Note Ronnald Ramp, EVIE   Mon Sep 11, 2021 11:56 AM)              Patient Active Problem List   Diagnosis Date Noted   SBO (small bowel obstruction) ([place]) 08/12/2021   Aortic atherosclerosis ([place]) 08/12/2021   Protein-calorie malnutrition, severe 08/10/2021   Enteritis 08/03/2021   BPH with obstruction/lower urinary tract symptoms 05/16/2021   Wrist pain, acute, left 03/21/2021   Lumbar radiculopathy, right 01/03/2018   Right hip pain 12/03/2017    Benign prostatic hyperplasia with weak urinary stream 06/07/2017   Essential hypertension 04/26/2017   Seasonal allergies 05/25/2016   Severe major depression, single episode, without psychotic features ([place]) 03/04/2016   Alcohol use disorder, severe, dependence ([place]) 03/04/2016   Cocaine use disorder, moderate, dependence ([place]) 03/04/2016   NICM (nonischemic cardiomyopathy) ([place]) 68/34/1962   Chronic systolic CHF (congestive heart failure) ([place]) 01/23/2016   Epigastric abdominal pain 22/97/9892   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   Diarrhea 06/13/2011   DERMATOPHYTOSIS OF SCALP AND BEARD 03/18/2008   TOBACCO USER 01/02/2008   Alcohol abuse 11/28/2006    SDOH:  (Social Determinants of Health) assessments and interventions performed:  SDOH Interventions    Flowsheet Row Most Recent Value  SDOH Interventions   Food Insecurity Interventions Intervention Not Indicated  Financial Strain Interventions Intervention Not Indicated  Housing Interventions Intervention Not Indicated  Stress Interventions Intervention Not Indicated       Care Plan  Review of patient past medical history, allergies, medications, health status, including review of consultants reports, laboratory and other test data, was performed as part of comprehensive evaluation for care management services.   Care Plan : RN Care Manager Plan of Care  Updates made by Barbaraann Faster, RN since 10/03/2021 12:00 AM     Problem: Complex Care Coordination Needs and disease management in patient with SBO, HTN   Priority: High     Long-Range Goal: Establish Plan of Care for Management Complex SDOH Barriers, disease management and Care Coordination Needs in patient with SBO, HTN, CHF   Start Date: 09/12/2021  This Visit's Progress: On track  Priority: High  Note:   Current Barriers:  Knowledge Deficits related to plan of care for management of CHF, HTN, and SBO  Care Coordination needs related to Limited education  about SBO, HTN, CHF* Barriers:Health Behaviors Knowledge Psychosocial Request to outreach to his aunt [name] related to his literacy concerns Presently patient living in an extended hotel with girlfriend, Mardene Celeste in [place]. Mardene Celeste per Aunt takes very good care of patient  RN CM Clinical Goal(s):  Patient will verbalize understanding of plan for management of CHF, HTN, and SBO as evidenced by prevention of re admission, improvement in BP values and decrease CHF symptoms  through collaboration with RN Care manager, provider, and care team.   Interventions: Further complex care outreaches for care coordination and disease management Inter-disciplinary care team collaboration (see longitudinal plan of care) Evaluation of current treatment plan related to  self management and patient's adherence to plan as established by provider   Heart Failure Interventions:  (Status:  New goal.) Long Term Goal Discussed the importance of keeping all appointments with provider Screening for signs and symptoms of depression related to chronic disease state   Small bowel obstruction (SBO)  (Status:  New goal.)  Short Term Goal Evaluation of current treatment plan related to  SBO , Limited education about  SBO* and Cognitive Deficits self-management and patient's adherence to plan as established by provider. 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 His  supervisor has been consulted to adjust his job duties on 10/03/21  Hypertension Interventions:  (Status:  New goal.) Short Term Goal Last practice recorded BP readings:  BP Readings from Last 3 Encounters:  09/11/21 98/80  09/08/21 118/74  07/04/21 124/71  Most recent eGFR/CrCl:  Lab Results  Component Value Date   EGFR 100 09/18/2021    No components found for: CRCL  Evaluation of current treatment plan related to hypertension self management and patient's adherence to plan as established by provider  Patient Goals/Self-Care Activities: Take all medications as prescribed Attend all scheduled provider appointments Attend church or other social activities Perform all self care activities independently  Perform IADL's (shopping, preparing meals, housekeeping, managing finances) independently keep legs up while sitting watch for swelling in feet, ankles and legs every day keep all doctor appointments report new symptoms to your doctor  Follow Up Plan:  The patient has been provided with contact information for the care management team and has been advised to call with any health related questions or concerns.  The care management team will reach out to the patient again over the next 14-21 days.      Plan: The care management team will reach out to the patient again over the next 14-21 days.  Kieren Ricci L. Lavina Hamman, RN, BSN, Green Coordinator Office number [phone] Main [facility] number [phone] Fax number [phone]

## 2021-10-04 DIAGNOSIS — Z48815 Encounter for surgical aftercare following surgery on the digestive system: Secondary | ICD-10-CM | POA: Diagnosis not present

## 2021-10-04 DIAGNOSIS — N138 Other obstructive and reflux uropathy: Secondary | ICD-10-CM | POA: Diagnosis not present

## 2021-10-04 DIAGNOSIS — E43 Unspecified severe protein-calorie malnutrition: Secondary | ICD-10-CM | POA: Diagnosis not present

## 2021-10-04 DIAGNOSIS — R3912 Poor urinary stream: Secondary | ICD-10-CM | POA: Diagnosis not present

## 2021-10-04 DIAGNOSIS — I5022 Chronic systolic (congestive) heart failure: Secondary | ICD-10-CM | POA: Diagnosis not present

## 2021-10-04 DIAGNOSIS — R69 Illness, unspecified: Secondary | ICD-10-CM | POA: Diagnosis not present

## 2021-10-04 DIAGNOSIS — N401 Enlarged prostate with lower urinary tract symptoms: Secondary | ICD-10-CM | POA: Diagnosis not present

## 2021-10-04 DIAGNOSIS — I700 Atherosclerosis of aorta: Secondary | ICD-10-CM | POA: Diagnosis not present

## 2021-10-04 DIAGNOSIS — I110 Hypertensive heart disease with heart failure: Secondary | ICD-10-CM | POA: Diagnosis not present

## 2021-10-12 DIAGNOSIS — N400 Enlarged prostate without lower urinary tract symptoms: Secondary | ICD-10-CM | POA: Diagnosis not present

## 2021-10-17 ENCOUNTER — Other Ambulatory Visit: Payer: Self-pay

## 2021-10-17 ENCOUNTER — Other Ambulatory Visit: Payer: Self-pay | Admitting: *Deleted

## 2021-10-17 NOTE — Patient Outreach (Signed)
[facility]) Care Management Telephonic RN Care Manager Note   10/17/2021 Name:  [name] MRN:  [id number] DOB:  1951-09-24  Summary: Follow outreach to patient's aunt She report patient did attend 2 follow appointments to surgeon and urology  She reports both appointment went well and she denies questions With assessment she reports he continues to recover well and is excited about returning to work- No needs identified She does report not receiving a return call from senior services of [place] resource for transportation offered by [employer] after leaving a message  Recommendations/Changes made from today's visit: RN CM discussed TH[facility] progress to [facility] health coach and Pt Aunt Stanton Kidney agrees to this progression to health coach- Discussed 11/03/21 upcoming outreach Encouraged Stanton Kidney to update RN CM if she does not receive a return call after RN CM attempt to call senior services of [place] for her  RN CM outreached and left a message for [employer] of senior services of [place] to return a call to [place] or RN CM about possible information for transportation for patient   Subjective: [name] is an 71 y.o. year old male who is a primary patient of Christopher Chandler, NP. The care management team was consulted for assistance with care management and/or care coordination needs.    [name] was referred to [facility] on 09/08/21 by [facility] hospital liaison for post hospital screening after his 08/03/21 to 09/08/21 admission  Telephonic RN Care Manager completed Telephone Visit today.   Objective:  Medications Reviewed Today     Reviewed by Barbaraann Faster, RN (Registered Nurse) on 10/03/21 at 1357  Med List Status: <None>   Medication Order Taking? Sig Documenting Provider Last Dose Status Informant  acetaminophen (TYLENOL) 325 MG tablet [id number] No Take 2 tablets (650 mg total) by mouth every 6 (six) hours as needed for moderate pain or mild pain (headache). Norm Parcel,  PA-C Taking Active   ascorbic acid (VITAMIN C) 500 MG tablet [id number]  Take 1 tablet (500 mg total) by mouth daily. Christopher Chandler, NP  Active   aspirin EC 81 MG tablet [id number] No Take 1 tablet (81 mg total) by mouth daily. Loel Dubonnet, NP Taking Active Self  Blood Pressure Monitoring (BLOOD PRESSURE MONITOR/L CUFF) MISC [id number]  1 application by Does not apply route daily. Ngetich, Dinah C, NP  Active   carvedilol (COREG) 3.125 MG tablet [id number] No Take 1 tablet (3.125 mg total) by mouth 2 (two) times daily. Thurnell Lose, MD Taking Active   docusate sodium (COLACE) 100 MG capsule [id number] No Take 1 capsule (100 mg total) by mouth daily. Thurnell Lose, MD Taking Active   Glycerin, PF, (CLEAR EYES PURE RELIEF PF) 0.25 % SOLN [id number] No Place 1-2 drops into the left eye as needed (for redness). [provider] Taking Active Self  polycarbophil (FIBERCON) 625 MG tablet [id number] No Take 1 tablet (625 mg total) by mouth daily. Norm Parcel, PA-C Taking Active   rosuvastatin (CRESTOR) 5 MG tablet [id number] No TAKE 1 TABLET(5 MG) BY MOUTH DAILY Christopher Chandler, NP Taking Active Self           Med Note Ronnald Ramp, EVIE   Mon Sep 11, 2021 11:55 AM)    traZODone (DESYREL) 100 MG tablet [id number] No TAKE 1 TABLET(100 MG) BY MOUTH AT BEDTIME AS NEEDED FOR SLEEP Dewaine Oats Carlos American, NP Taking Active Self  Med Note Ronnald Ramp, EVIE   Mon Sep 11, 2021 11:56 AM)             Patient Active Problem List   Diagnosis Date Noted   SBO (small bowel obstruction) ([place]) 08/12/2021   Aortic atherosclerosis ([place]) 08/12/2021   Protein-calorie malnutrition, severe 08/10/2021   Enteritis 08/03/2021   BPH with obstruction/lower urinary tract symptoms 05/16/2021   Wrist pain, acute, left 03/21/2021   Lumbar radiculopathy, right 01/03/2018   Right hip pain 12/03/2017   Benign prostatic hyperplasia with weak urinary stream 06/07/2017   Essential hypertension 04/26/2017    Seasonal allergies 05/25/2016   Severe major depression, single episode, without psychotic features ([place]) 03/04/2016   Alcohol use disorder, severe, dependence ([place]) 03/04/2016   Cocaine use disorder, moderate, dependence ([place]) 03/04/2016   NICM (nonischemic cardiomyopathy) ([place]) 23/53/6144   Chronic systolic CHF (congestive heart failure) ([place]) 01/23/2016   Epigastric abdominal pain 31/54/0086   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   Diarrhea 06/13/2011   DERMATOPHYTOSIS OF SCALP AND BEARD 03/18/2008   TOBACCO USER 01/02/2008   Alcohol abuse 11/28/2006     SDOH:  (Social Determinants of Health) assessments and interventions performed:    Care Plan  Review of patient past medical history, allergies, medications, health status, including review of consultants reports, laboratory and other test data, was performed as part of comprehensive evaluation for care management services.   Care Plan : RN Care Manager Plan of Care  Updates made by Barbaraann Faster, RN since 10/17/2021 12:00 AM     Problem: Complex Care Coordination Needs and disease management in patient with SBO, HTN   Priority: High     Long-Range Goal: Establish Plan of Care for Management Complex SDOH Barriers, disease management and Care Coordination Needs in patient with SBO, HTN, CHF   Start Date: 09/12/2021  This Visit's Progress: On track  Recent Progress: On track  Priority: High  Note:   Current Barriers:  Knowledge Deficits related to plan of care for management of CHF, HTN, and SBO  Care Coordination needs related to Limited education about SBO, HTN, CHF* Barriers:Health Behaviors Knowledge Psychosocial Request to outreach to his aunt [name] related to his literacy concerns Presently patient living in an extended hotel with girlfriend, Mardene Celeste in [place]. Mardene Celeste per Aunt takes very good care of patient  RN CM Clinical Goal(s):  Patient will verbalize understanding of plan for management of  CHF, HTN, and SBO as evidenced by prevention of re admission, improvement in BP values and decrease CHF symptoms  through collaboration with RN Care manager, provider, and care team.  10/17/21 pt has not been re hospitalized since 09/08/21 discharge- goal met for prevention of re admission, BP remaining within normal limits, transferred to [facility] disease management for further education for HTN and CHF  Interventions: Further complex care outreaches for care coordination and disease management Inter-disciplinary care team collaboration (see longitudinal plan of care) Evaluation of current treatment plan related to  self management and patient's adherence to plan as established by provider   Heart Failure Interventions:  (Status:  Goal on track:  Yes.) Long Term Goal Discussed the importance of keeping all appointments with provider Screening for signs and symptoms of depression related to chronic disease state   Small bowel obstruction (SBO)  (Status:  Goal Met.)  Short Term Goal Evaluation of current treatment plan related to  SBO , Limited education about SBO* and Cognitive Deficits self-management and patient's adherence to plan as established by provider.  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 His supervisor has been consulted to adjust his job duties on 10/03/21 10/17/21 left message for senior services of [place] (transportation-Ben) request call back to  RN CM and/or Stanton Kidney. Encourage Mary to call if she does not receive a call. Transfer to [facility] Disease management services   Hypertension Interventions:  (Status:  Goal on track:  Yes.) Short Term Goal Last practice recorded BP readings:  BP Readings from Last 3 Encounters:  09/11/21 98/80  09/08/21 118/74  07/04/21 124/71  Most recent eGFR/CrCl:  Lab Results  Component Value Date   EGFR 100 09/18/2021    No components found for: CRCL  Evaluation of current treatment plan related to hypertension self management and patient's adherence to plan as established by provider  Patient Goals/Self-Care Activities: Take all medications as prescribed Attend all scheduled provider appointments Attend church or other social activities Perform all self care activities independently  Perform IADL's (shopping, preparing meals, housekeeping, managing finances) independently keep legs up while sitting watch for swelling in feet, ankles and legs every day keep all doctor appointments report new symptoms to your doctor  Follow Up Plan:  The patient has been provided with contact information for the care management team and has been advised to call with any health related questions or concerns.  Transferred to [facility] Disease management services      Plan: The patient has been provided with contact information for the care management team and has been advised to call with any health related questions or concerns.  Patient transferred back to [place] coach/Disease management services   Christopher Blackwell L. Lavina Hamman, RN, BSN, [place] Coordinator Office number [phone] Main [facility] number [phone] Fax number [phone]

## 2021-10-17 NOTE — Patient Outreach (Signed)
[facility]) Care Management  10/17/2021  [name] 04/30/1951 [id number]  Referral received from Edd Arbour, RN for Care Management services to assess as patient needs further education and support in managing HTN and CHF. Assigned to [name], RN Care Coordinator.    [name] [facility] Management Assistant [phone]

## 2021-10-20 ENCOUNTER — Other Ambulatory Visit: Payer: Self-pay | Admitting: *Deleted

## 2021-10-24 ENCOUNTER — Ambulatory Visit: Payer: Medicare HMO | Admitting: Internal Medicine

## 2021-11-03 ENCOUNTER — Other Ambulatory Visit: Payer: Self-pay | Admitting: *Deleted

## 2021-11-03 NOTE — Patient Outreach (Signed)
[facility]) Care Management  11/03/2021  [name] 1950-12-11 [id number]  Successful telephone outreach call to patient's Aunt [name]. HIPAA identifiers obtained.  Aunt explained that at this time the patient does not want to participate directly with the [facility] program. He would be interested in receiving health education periodically through the mail. Aunt explained that if health care needs arise the Aunt/patient would contact this nurse for assistance. Aunt did not have any further questions or concerns today and did confirm that she has this nurse's contact number to call her if needed.   Plan: Nurse will begin to send patient health and wellness care education on a semi-annual basis. Will send EMMI: Heart Failure, Adult today.  Emelia Loron RN, [place] [phone] [email or web address]

## 2021-11-20 ENCOUNTER — Other Ambulatory Visit: Payer: Self-pay | Admitting: Nurse Practitioner

## 2021-11-20 DIAGNOSIS — E785 Hyperlipidemia, unspecified: Secondary | ICD-10-CM

## 2021-11-29 ENCOUNTER — Encounter: Payer: Self-pay | Admitting: Orthopedic Surgery

## 2021-11-30 ENCOUNTER — Other Ambulatory Visit: Payer: Self-pay | Admitting: Nurse Practitioner

## 2021-11-30 ENCOUNTER — Ambulatory Visit (INDEPENDENT_AMBULATORY_CARE_PROVIDER_SITE_OTHER): Payer: Medicare HMO | Admitting: Nurse Practitioner

## 2021-11-30 ENCOUNTER — Other Ambulatory Visit: Payer: Self-pay

## 2021-11-30 DIAGNOSIS — G4700 Insomnia, unspecified: Secondary | ICD-10-CM

## 2021-11-30 DIAGNOSIS — E785 Hyperlipidemia, unspecified: Secondary | ICD-10-CM

## 2021-11-30 MED ORDER — TRAZODONE HCL 100 MG PO TABS: ORAL_TABLET | ORAL | 5 refills | Status: DC

## 2021-11-30 MED ORDER — ROSUVASTATIN CALCIUM 5 MG PO TABS: ORAL_TABLET | ORAL | 1 refills | Status: DC

## 2021-11-30 NOTE — Progress Notes (Signed)
Pt needing refills on medication, given.  ?Does not need cardiology referral, has upcoming appt  ?

## 2021-12-15 ENCOUNTER — Ambulatory Visit (HOSPITAL_BASED_OUTPATIENT_CLINIC_OR_DEPARTMENT_OTHER): Payer: Medicare HMO | Admitting: Family

## 2021-12-15 ENCOUNTER — Other Ambulatory Visit: Payer: Self-pay

## 2021-12-15 ENCOUNTER — Encounter (HOSPITAL_BASED_OUTPATIENT_CLINIC_OR_DEPARTMENT_OTHER): Payer: Self-pay | Admitting: Family

## 2021-12-15 VITALS — BP 116/78 | HR 57 | Ht 73.0 in | Wt 165.4 lb

## 2021-12-15 DIAGNOSIS — I5022 Chronic systolic (congestive) heart failure: Secondary | ICD-10-CM | POA: Diagnosis not present

## 2021-12-15 DIAGNOSIS — I10 Essential (primary) hypertension: Secondary | ICD-10-CM | POA: Diagnosis not present
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
## 2021-12-15 NOTE — Patient Instructions (Signed)
Medication Instructions:  ?Continue your current medications.  ? ?*If you need a refill on your cardiac medications before your next appointment, please call your pharmacy* ? ? ?Lab Work: ?None ordered today.  ? ? ?Testing/Procedures: ?Your EKG today showed normal sinus rhythm.  ? ? ?Follow-Up: ?At [facility], you and your health needs are our priority.  As part of our continuing mission to provide you with exceptional heart care, we have created designated Provider Care Teams.  These Care Teams include your primary Cardiologist (physician) and Advanced Practice Providers (APPs -  Physician Assistants and Nurse Practitioners) who all work together to provide you with the care you need, when you need it. ? ?We recommend signing up for the patient portal called "MyChart".  Sign up information is provided on this After Visit Summary.  MyChart is used to connect with patients for Virtual Visits (Telemedicine).  Patients are able to view lab/test results, encounter notes, upcoming appointments, etc.  Non-urgent messages can be sent to your provider as well.   ?To learn more about what you can do with MyChart, go to [email or web address].   ? ?Your next appointment:   ?1 year(s) ? ?The format for your next appointment:   ?In Person ? ?Provider:   ?Dietrich Pates, MD  ? ? ?Other Instructions ? ?Recommend adding an Ensure in the morning for nutrition.  ? ?Heart Healthy Diet Recommendations: ?A low-salt diet is recommended. Meats should be grilled, baked, or boiled. Avoid fried foods. Focus on lean protein sources like fish or chicken with vegetables and fruits. The American Heart Association is a Chief Technology Officer!  American Heart Association Diet and Lifeystyle Recommendations   ? ?Exercise recommendations: ?The American Heart Association recommends 150 minutes of moderate intensity exercise weekly. ?Try 30 minutes of moderate intensity exercise 4-5 times per week. ?This could include walking, jogging, or swimming. ?  ?

## 2022-02-01 ENCOUNTER — Other Ambulatory Visit: Payer: Self-pay | Admitting: Nurse Practitioner

## 2022-02-01 DIAGNOSIS — G4700 Insomnia, unspecified: Secondary | ICD-10-CM

## 2022-02-12 ENCOUNTER — Other Ambulatory Visit: Payer: Self-pay | Admitting: Nurse Practitioner

## 2022-02-12 DIAGNOSIS — G4700 Insomnia, unspecified: Secondary | ICD-10-CM

## 2022-02-19 ENCOUNTER — Ambulatory Visit (INDEPENDENT_AMBULATORY_CARE_PROVIDER_SITE_OTHER): Payer: Medicare HMO | Admitting: Family

## 2022-02-19 ENCOUNTER — Encounter: Payer: Self-pay | Admitting: Family

## 2022-02-19 VITALS — BP 118/80 | HR 74 | Temp 97.3°F | Resp 16 | Ht 73.0 in | Wt 156.3 lb

## 2022-02-19 DIAGNOSIS — N5082 Scrotal pain: Secondary | ICD-10-CM

## 2022-02-19 DIAGNOSIS — G4700 Insomnia, unspecified: Secondary | ICD-10-CM

## 2022-02-19 DIAGNOSIS — N50811 Right testicular pain: Secondary | ICD-10-CM

## 2022-02-19 DIAGNOSIS — E785 Hyperlipidemia, unspecified: Secondary | ICD-10-CM

## 2022-02-19 MED ORDER — ROSUVASTATIN CALCIUM 5 MG PO TABS: ORAL_TABLET | ORAL | 1 refills | Status: DC

## 2022-02-19 MED ORDER — DOXYCYCLINE HYCLATE 100 MG PO TABS: 100.0000 mg | ORAL_TABLET | Freq: Two times a day (BID) | ORAL | 0 refills | Status: AC

## 2022-02-19 MED ORDER — TRAZODONE HCL 100 MG PO TABS: ORAL_TABLET | ORAL | 5 refills | Status: DC
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
## 2022-02-19 NOTE — Patient Instructions (Signed)
-   Please go to emergency room or notify provider if right scrotum pain or swelling worsen

## 2022-02-21 ENCOUNTER — Telehealth: Payer: Self-pay | Admitting: Family

## 2022-02-21 NOTE — Telephone Encounter (Signed)
Called patient to follow up to check on how he id doing on antibiotics for recent scrotal and testicular pain but did not answer.POA Senaida Lange called Husband answered said she was out of town.Advised to tell Mr.Schadt to call office need to make sure he follows up with urologist as advised during visit.

## 2022-03-07 NOTE — Telephone Encounter (Signed)
Noted  

## 2022-03-07 NOTE — Telephone Encounter (Signed)
Called and spoke with patient [phone] regarding Urology appointment and he stated to call his [name] [phone] and give her the information.   Called and gave her the number to [facility] and she stated that she will call and set up patient an appointment.

## 2022-03-14 ENCOUNTER — Other Ambulatory Visit: Payer: Self-pay | Admitting: *Deleted

## 2022-03-14 NOTE — Patient Outreach (Signed)
[facility]) Care Management  03/14/2022  [name] 05/05/1951 [id number]  Nurse Health Coach closed patient's case and sent patient a case closed letter. [facility] has discontinued the educational mailing program.  [name] RN, BSN Nurse Health Coach [facility] [phone] [name]
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
## 2022-03-16 ENCOUNTER — Encounter: Payer: Self-pay | Admitting: Nurse Practitioner

## 2022-03-16 ENCOUNTER — Telehealth: Payer: Self-pay | Admitting: *Deleted

## 2022-03-16 ENCOUNTER — Ambulatory Visit (INDEPENDENT_AMBULATORY_CARE_PROVIDER_SITE_OTHER): Payer: Medicare HMO | Admitting: Nurse Practitioner

## 2022-03-16 VITALS — BP 114/80 | HR 66 | Temp 96.2°F | Resp 16 | Ht 73.0 in | Wt 157.8 lb

## 2022-03-16 DIAGNOSIS — I10 Essential (primary) hypertension: Secondary | ICD-10-CM

## 2022-03-16 DIAGNOSIS — N138 Other obstructive and reflux uropathy: Secondary | ICD-10-CM

## 2022-03-16 DIAGNOSIS — G4700 Insomnia, unspecified: Secondary | ICD-10-CM | POA: Diagnosis not present

## 2022-03-16 DIAGNOSIS — N401 Enlarged prostate with lower urinary tract symptoms: Secondary | ICD-10-CM

## 2022-03-16 DIAGNOSIS — E785 Hyperlipidemia, unspecified: Secondary | ICD-10-CM

## 2022-03-16 DIAGNOSIS — D539 Nutritional anemia, unspecified: Secondary | ICD-10-CM | POA: Diagnosis not present

## 2022-03-16 DIAGNOSIS — I5022 Chronic systolic (congestive) heart failure: Secondary | ICD-10-CM | POA: Diagnosis not present

## 2022-03-16 DIAGNOSIS — F1011 Alcohol abuse, in remission: Secondary | ICD-10-CM

## 2022-03-16 DIAGNOSIS — E440 Moderate protein-calorie malnutrition: Secondary | ICD-10-CM | POA: Diagnosis not present

## 2022-03-16 DIAGNOSIS — R69 Illness, unspecified: Secondary | ICD-10-CM | POA: Diagnosis not present

## 2022-03-16 MED ORDER — TRAZODONE HCL 100 MG PO TABS: ORAL_TABLET | ORAL | 5 refills | Status: DC

## 2022-03-16 MED ORDER — ROSUVASTATIN CALCIUM 5 MG PO TABS: ORAL_TABLET | ORAL | 1 refills | Status: DC

## 2022-03-16 NOTE — Telephone Encounter (Signed)
Mary Notified and agreed.

## 2022-03-16 NOTE — Patient Instructions (Addendum)
Please contact your local pharmacy, previous provider, or insurance carrier for vaccine/immunization records. Ensure that any procedures done outside of [facility] and Adult Medicine are faxed to [place] [phone] or you can sign release of records form at the front desk to keep your medical record updated.     Make sure you are eating 3 meals a day and getting proper protein with each meal.   To make follow up with urologist  [facility] Address: [address] [place], [place], [place] [address] Phone: [phone]

## 2022-03-16 NOTE — Telephone Encounter (Signed)
Patient's Aunt, [name] called and stated that patient was seen today and was told to have her call [facility] to schedule an appointment for patient.   Aunt stated that she needs to know why she needs to schedule an appointment because they are going to ask.   Please Advise.

## 2022-03-16 NOTE — Telephone Encounter (Signed)
She had testicular pain and swelling, this has since resolved but still needing follow up, he also has hx of enlarged prostate and was following with them

## 2022-03-20 ENCOUNTER — Telehealth: Payer: Self-pay | Admitting: *Deleted

## 2022-03-20 ENCOUNTER — Ambulatory Visit (INDEPENDENT_AMBULATORY_CARE_PROVIDER_SITE_OTHER): Payer: Medicare HMO | Admitting: Nurse Practitioner

## 2022-03-20 DIAGNOSIS — Z0000 Encounter for general adult medical examination without abnormal findings: Secondary | ICD-10-CM | POA: Diagnosis not present

## 2022-03-20 NOTE — Patient Instructions (Signed)
[name] , Thank you for taking time to come for your Medicare Wellness Visit. I appreciate your ongoing commitment to your health goals. Please review the following plan we discussed and let me know if I can assist you in the future.   Screening recommendations/referrals: Colonoscopy up to date Recommended yearly ophthalmology/optometry visit for glaucoma screening and checkup Recommended yearly dental visit for hygiene and checkup  Vaccinations: Influenza vaccine up to date Pneumococcal vaccine up to date Tdap vaccine up todate Shingles vaccine DUE- recommend to get at your local pharmacy       Advanced directives: recommend to complete  Conditions/risks identified: advance age, htn, hx of alcohol abuse  Next appointment: yearly for awv  Preventive Care 43 Years and Older, Male Preventive care refers to lifestyle choices and visits with your health care provider that can promote health and wellness. What does preventive care include? A yearly physical exam. This is also called an annual well check. Dental exams once or twice a year. Routine eye exams. Ask your health care provider how often you should have your eyes checked. Personal lifestyle choices, including: Daily care of your teeth and gums. Regular physical activity. Eating a healthy diet. Avoiding tobacco and drug use. Limiting alcohol use. Practicing safe sex. Taking low doses of aspirin every day. Taking vitamin and mineral supplements as recommended by your health care provider. What happens during an annual well check? The services and screenings done by your health care provider during your annual well check will depend on your age, overall health, lifestyle risk factors, and family history of disease. Counseling  Your health care provider may ask you questions about your: Alcohol use. Tobacco use. Drug use. Emotional well-being. Home and relationship well-being. Sexual activity. Eating habits. History  of falls. Memory and ability to understand (cognition). Work and work Astronomer. Screening  You may have the following tests or measurements: Height, weight, and BMI. Blood pressure. Lipid and cholesterol levels. These may be checked every 5 years, or more frequently if you are over 62 years old. Skin check. Lung cancer screening. You may have this screening every year starting at age 57 if you have a 30-pack-year history of smoking and currently smoke or have quit within the past 15 years. Fecal occult blood test (FOBT) of the stool. You may have this test every year starting at age 54. Flexible sigmoidoscopy or colonoscopy. You may have a sigmoidoscopy every 5 years or a colonoscopy every 10 years starting at age 90. Prostate cancer screening. Recommendations will vary depending on your family history and other risks. Hepatitis C blood test. Hepatitis B blood test. Sexually transmitted disease (STD) testing. Diabetes screening. This is done by checking your blood sugar (glucose) after you have not eaten for a while (fasting). You may have this done every 1-3 years. Abdominal aortic aneurysm (AAA) screening. You may need this if you are a current or former smoker. Osteoporosis. You may be screened starting at age 49 if you are at high risk. Talk with your health care provider about your test results, treatment options, and if necessary, the need for more tests. Vaccines  Your health care provider may recommend certain vaccines, such as: Influenza vaccine. This is recommended every year. Tetanus, diphtheria, and acellular pertussis (Tdap, Td) vaccine. You may need a Td booster every 10 years. Zoster vaccine. You may need this after age 24. Pneumococcal 13-valent conjugate (PCV13) vaccine. One dose is recommended after age 65. Pneumococcal polysaccharide (PPSV23) vaccine. One dose is recommended  after age 98. Talk to your health care provider about which screenings and vaccines you need  and how often you need them. This information is not intended to replace advice given to you by your health care provider. Make sure you discuss any questions you have with your health care provider. Document Released: 10/14/2015 Document Revised: 06/06/2016 Document Reviewed: 07/19/2015 Elsevier Interactive Patient Education  2017 [employer].  Fall Prevention in the Home Falls can cause injuries. They can happen to people of all ages. There are many things you can do to make your home safe and to help prevent falls. What can I do on the outside of my home? Regularly fix the edges of walkways and driveways and fix any cracks. Remove anything that might make you trip as you walk through a door, such as a raised step or threshold. Trim any bushes or trees on the path to your home. Use bright outdoor lighting. Clear any walking paths of anything that might make someone trip, such as rocks or tools. Regularly check to see if handrails are loose or broken. Make sure that both sides of any steps have handrails. Any raised decks and porches should have guardrails on the edges. Have any leaves, snow, or ice cleared regularly. Use sand or salt on walking paths during winter. Clean up any spills in your garage right away. This includes oil or grease spills. What can I do in the bathroom? Use night lights. Install grab bars by the toilet and in the tub and shower. Do not use towel bars as grab bars. Use non-skid mats or decals in the tub or shower. If you need to sit down in the shower, use a plastic, non-slip stool. Keep the floor dry. Clean up any water that spills on the floor as soon as it happens. Remove soap buildup in the tub or shower regularly. Attach bath mats securely with double-sided non-slip rug tape. Do not have throw rugs and other things on the floor that can make you trip. What can I do in the bedroom? Use night lights. Make sure that you have a light by your bed that is easy  to reach. Do not use any sheets or blankets that are too big for your bed. They should not hang down onto the floor. Have a firm chair that has side arms. You can use this for support while you get dressed. Do not have throw rugs and other things on the floor that can make you trip. What can I do in the kitchen? Clean up any spills right away. Avoid walking on wet floors. Keep items that you use a lot in easy-to-reach places. If you need to reach something above you, use a strong step stool that has a grab bar. Keep electrical cords out of the way. Do not use floor polish or wax that makes floors slippery. If you must use wax, use non-skid floor wax. Do not have throw rugs and other things on the floor that can make you trip. What can I do with my stairs? Do not leave any items on the stairs. Make sure that there are handrails on both sides of the stairs and use them. Fix handrails that are broken or loose. Make sure that handrails are as long as the stairways. Check any carpeting to make sure that it is firmly attached to the stairs. Fix any carpet that is loose or worn. Avoid having throw rugs at the top or bottom of the stairs. If you  do have throw rugs, attach them to the floor with carpet tape. Make sure that you have a light switch at the top of the stairs and the bottom of the stairs. If you do not have them, ask someone to add them for you. What else can I do to help prevent falls? Wear shoes that: Do not have high heels. Have rubber bottoms. Are comfortable and fit you well. Are closed at the toe. Do not wear sandals. If you use a stepladder: Make sure that it is fully opened. Do not climb a closed stepladder. Make sure that both sides of the stepladder are locked into place. Ask someone to hold it for you, if possible. Clearly mark and make sure that you can see: Any grab bars or handrails. First and last steps. Where the edge of each step is. Use tools that help you move  around (mobility aids) if they are needed. These include: Canes. Walkers. Scooters. Crutches. Turn on the lights when you go into a dark area. Replace any light bulbs as soon as they burn out. Set up your furniture so you have a clear path. Avoid moving your furniture around. If any of your floors are uneven, fix them. If there are any pets around you, be aware of where they are. Review your medicines with your doctor. Some medicines can make you feel dizzy. This can increase your chance of falling. Ask your doctor what other things that you can do to help prevent falls. This information is not intended to replace advice given to you by your health care provider. Make sure you discuss any questions you have with your health care provider. Document Released: 07/14/2009 Document Revised: 02/23/2016 Document Reviewed: 10/22/2014 Elsevier Interactive Patient Education  2017 [employer].

## 2022-03-20 NOTE — Telephone Encounter (Signed)
Mr. obert, espindola are scheduled for a virtual visit with your provider today.    Just as we do with appointments in the office, we must obtain your consent to participate.  Your consent will be active for this visit and any virtual visit you Olusegun Gerstenberger have with one of our providers in the next 365 days.    If you have a MyChart account, I can also send a copy of this consent to you electronically.  All virtual visits are billed to your insurance company just like a traditional visit in the office.  As this is a virtual visit, video technology does not allow for your provider to perform a traditional examination.  This Emonte Dieujuste limit your provider's ability to fully assess your condition.  If your provider identifies any concerns that need to be evaluated in person or the need to arrange testing such as labs, EKG, etc, we will make arrangements to do so.    Although advances in technology are sophisticated, we cannot ensure that it will always work on either your end or our end.  If the connection with a video visit is poor, we Donnelle Rubey have to switch to a telephone visit.  With either a video or telephone visit, we are not always able to ensure that we have a secure connection.   I need to obtain your verbal consent now.   Are you willing to proceed with your visit today?   AYEDEN GLADMAN has provided verbal consent on 03/20/2022 for a virtual visit (video or telephone).   MayBeckey Downing, [place] 03/20/2022  9:17 AM
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
## 2022-03-22 LAB — COMPLETE METABOLIC PANEL WITH GFR
AG Ratio: 1.8 (calc) (ref 1.0–2.5)
ALT: 18 U/L (ref 9–46)
AST: 21 U/L (ref 10–35)
Albumin: 4.2 g/dL (ref 3.6–5.1)
Alkaline phosphatase (APISO): 48 U/L (ref 35–144)
BUN: 12 mg/dL (ref 7–25)
CO2: 30 mmol/L (ref 20–32)
Calcium: 9.2 mg/dL (ref 8.6–10.3)
Chloride: 108 mmol/L (ref 98–110)
Creat: 0.72 mg/dL (ref 0.70–1.28)
Globulin: 2.4 g/dL (calc) (ref 1.9–3.7)
Glucose, Bld: 90 mg/dL (ref 65–99)
Potassium: 4.5 mmol/L (ref 3.5–5.3)
Sodium: 143 mmol/L (ref 135–146)
Total Bilirubin: 0.4 mg/dL (ref 0.2–1.2)
Total Protein: 6.6 g/dL (ref 6.1–8.1)
eGFR: 98 mL/min/{1.73_m2} (ref >=60)

## 2022-03-22 LAB — CBC WITH DIFFERENTIAL/PLATELET
Absolute Monocytes: 223 cells/uL (ref 200–950)
Basophils Absolute: 10 cells/uL (ref 0–200)
Basophils Relative: 0.4 %
Eosinophils Absolute: 385 cells/uL (ref 15–500)
Eosinophils Relative: 15.4 %
HCT: 35.8 % — ABNORMAL LOW (ref 38.5–50.0)
Hemoglobin: 12 g/dL — ABNORMAL LOW (ref 13.2–17.1)
Lymphs Abs: 1215 cells/uL (ref 850–3900)
MCH: 33.1 pg — ABNORMAL HIGH (ref 27.0–33.0)
MCHC: 33.5 g/dL (ref 32.0–36.0)
MCV: 98.9 fL (ref 80.0–100.0)
MPV: 10.7 fL (ref 7.5–12.5)
Monocytes Relative: 8.9 %
Neutro Abs: 668 cells/uL — ABNORMAL LOW (ref 1500–7800)
Neutrophils Relative %: 26.7 %
Platelets: 200 10*3/uL (ref 140–400)
RBC: 3.62 10*6/uL — ABNORMAL LOW (ref 4.20–5.80)
RDW: 12.9 % (ref 11.0–15.0)
Total Lymphocyte: 48.6 %
WBC: 2.5 10*3/uL — ABNORMAL LOW (ref 3.8–10.8)

## 2022-03-22 LAB — FOLATE: Folate: 16.6 ng/mL

## 2022-03-22 LAB — TEST AUTHORIZATION

## 2022-03-22 LAB — VITAMIN B12: Vitamin B-12: 239 pg/mL (ref 200–1100)

## 2022-03-23 ENCOUNTER — Telehealth: Payer: Self-pay

## 2022-04-09 DIAGNOSIS — N401 Enlarged prostate with lower urinary tract symptoms: Secondary | ICD-10-CM | POA: Diagnosis not present

## 2022-04-09 DIAGNOSIS — R3121 Asymptomatic microscopic hematuria: Secondary | ICD-10-CM | POA: Diagnosis not present

## 2022-04-09 DIAGNOSIS — R351 Nocturia: Secondary | ICD-10-CM | POA: Diagnosis not present

## 2022-04-09 DIAGNOSIS — R3912 Poor urinary stream: Secondary | ICD-10-CM | POA: Diagnosis not present

## 2022-04-09 DIAGNOSIS — R350 Frequency of micturition: Secondary | ICD-10-CM | POA: Diagnosis not present

## 2022-05-03 ENCOUNTER — Ambulatory Visit: Payer: Medicare HMO | Admitting: *Deleted

## 2022-05-23 DIAGNOSIS — N401 Enlarged prostate with lower urinary tract symptoms: Secondary | ICD-10-CM | POA: Diagnosis not present

## 2022-05-30 DIAGNOSIS — N3289 Other specified disorders of bladder: Secondary | ICD-10-CM | POA: Diagnosis not present

## 2022-05-30 DIAGNOSIS — N401 Enlarged prostate with lower urinary tract symptoms: Secondary | ICD-10-CM | POA: Diagnosis not present

## 2022-05-30 DIAGNOSIS — R3121 Asymptomatic microscopic hematuria: Secondary | ICD-10-CM | POA: Diagnosis not present

## 2022-05-30 DIAGNOSIS — N50811 Right testicular pain: Secondary | ICD-10-CM | POA: Diagnosis not present

## 2022-05-30 DIAGNOSIS — N281 Cyst of kidney, acquired: Secondary | ICD-10-CM | POA: Diagnosis not present

## 2022-05-30 DIAGNOSIS — N400 Enlarged prostate without lower urinary tract symptoms: Secondary | ICD-10-CM | POA: Diagnosis not present

## 2022-05-30 DIAGNOSIS — R319 Hematuria, unspecified: Secondary | ICD-10-CM | POA: Diagnosis not present

## 2022-05-30 DIAGNOSIS — R3912 Poor urinary stream: Secondary | ICD-10-CM | POA: Diagnosis not present

## 2022-05-31 ENCOUNTER — Other Ambulatory Visit: Payer: Self-pay | Admitting: Urology

## 2022-05-31 DIAGNOSIS — R93811 Abnormal radiologic findings on diagnostic imaging of right testicle: Secondary | ICD-10-CM

## 2022-06-05 ENCOUNTER — Other Ambulatory Visit: Payer: Medicare HMO

## 2022-06-18 ENCOUNTER — Encounter: Payer: Medicare HMO | Admitting: Nurse Practitioner

## 2022-06-19 NOTE — Progress Notes (Signed)
err

## 2022-06-21 ENCOUNTER — Encounter: Payer: Self-pay | Admitting: Orthopedic Surgery

## 2022-06-21 ENCOUNTER — Ambulatory Visit (INDEPENDENT_AMBULATORY_CARE_PROVIDER_SITE_OTHER): Payer: Medicare HMO | Admitting: Orthopedic Surgery

## 2022-06-21 VITALS — BP 136/78 | HR 80 | Temp 97.7°F | Resp 20 | Ht 73.0 in | Wt 162.2 lb

## 2022-06-21 DIAGNOSIS — R69 Illness, unspecified: Secondary | ICD-10-CM | POA: Diagnosis not present

## 2022-06-21 DIAGNOSIS — E440 Moderate protein-calorie malnutrition: Secondary | ICD-10-CM

## 2022-06-21 DIAGNOSIS — Z23 Encounter for immunization: Secondary | ICD-10-CM | POA: Diagnosis not present

## 2022-06-21 DIAGNOSIS — N401 Enlarged prostate with lower urinary tract symptoms: Secondary | ICD-10-CM

## 2022-06-21 DIAGNOSIS — G4700 Insomnia, unspecified: Secondary | ICD-10-CM | POA: Diagnosis not present

## 2022-06-21 DIAGNOSIS — I10 Essential (primary) hypertension: Secondary | ICD-10-CM

## 2022-06-21 DIAGNOSIS — E785 Hyperlipidemia, unspecified: Secondary | ICD-10-CM

## 2022-06-21 DIAGNOSIS — F1010 Alcohol abuse, uncomplicated: Secondary | ICD-10-CM

## 2022-06-21 DIAGNOSIS — I5022 Chronic systolic (congestive) heart failure: Secondary | ICD-10-CM

## 2022-06-21 DIAGNOSIS — N138 Other obstructive and reflux uropathy: Secondary | ICD-10-CM | POA: Diagnosis not present

## 2022-06-21 MED ORDER — ROSUVASTATIN CALCIUM 5 MG PO TABS: ORAL_TABLET | ORAL | 1 refills | Status: DC

## 2022-06-21 MED ORDER — TRAZODONE HCL 100 MG PO TABS: ORAL_TABLET | ORAL | 5 refills | Status: DC

## 2022-06-21 MED ORDER — CARVEDILOL 3.125 MG PO TABS
3.1250 mg | ORAL_TABLET | Freq: Two times a day (BID) | ORAL | 11 refills | Status: DC
Start: 2022-06-21 — End: 2023-08-02
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
## 2022-06-21 NOTE — Patient Instructions (Addendum)
Consider getting Shingles vaccine at local pharmacy  STOP DRINKING ALCOHOL

## 2022-06-22 LAB — COMPLETE METABOLIC PANEL WITH GFR
AG Ratio: 1.9 (calc) (ref 1.0–2.5)
ALT: 14 U/L (ref 9–46)
AST: 19 U/L (ref 10–35)
Albumin: 4.4 g/dL (ref 3.6–5.1)
Alkaline phosphatase (APISO): 44 U/L (ref 35–144)
BUN: 14 mg/dL (ref 7–25)
CO2: 30 mmol/L (ref 20–32)
Calcium: 9.4 mg/dL (ref 8.6–10.3)
Chloride: 107 mmol/L (ref 98–110)
Creat: 0.89 mg/dL (ref 0.70–1.28)
Globulin: 2.3 g/dL (calc) (ref 1.9–3.7)
Glucose, Bld: 72 mg/dL (ref 65–139)
Potassium: 4.1 mmol/L (ref 3.5–5.3)
Sodium: 143 mmol/L (ref 135–146)
Total Bilirubin: 0.4 mg/dL (ref 0.2–1.2)
Total Protein: 6.7 g/dL (ref 6.1–8.1)
eGFR: 92 mL/min/{1.73_m2} (ref >=60)

## 2022-06-22 LAB — CBC WITH DIFFERENTIAL/PLATELET
Absolute Monocytes: 327 cells/uL (ref 200–950)
Basophils Absolute: 20 cells/uL (ref 0–200)
Basophils Relative: 0.6 %
Eosinophils Absolute: 376 cells/uL (ref 15–500)
Eosinophils Relative: 11.4 %
HCT: 37.1 % — ABNORMAL LOW (ref 38.5–50.0)
Hemoglobin: 12.6 g/dL — ABNORMAL LOW (ref 13.2–17.1)
Lymphs Abs: 1729 cells/uL (ref 850–3900)
MCH: 33.1 pg — ABNORMAL HIGH (ref 27.0–33.0)
MCHC: 34 g/dL (ref 32.0–36.0)
MCV: 97.4 fL (ref 80.0–100.0)
MPV: 11.4 fL (ref 7.5–12.5)
Monocytes Relative: 9.9 %
Neutro Abs: 848 cells/uL — ABNORMAL LOW (ref 1500–7800)
Neutrophils Relative %: 25.7 %
Platelets: 211 10*3/uL (ref 140–400)
RBC: 3.81 10*6/uL — ABNORMAL LOW (ref 4.20–5.80)
RDW: 12.3 % (ref 11.0–15.0)
Total Lymphocyte: 52.4 %
WBC: 3.3 10*3/uL — ABNORMAL LOW (ref 3.8–10.8)

## 2022-07-03 ENCOUNTER — Ambulatory Visit
Admission: RE | Admit: 2022-07-03 | Discharge: 2022-07-03 | Disposition: A | Discharge status: Home / Self Care | Payer: Medicare HMO | Source: Ambulatory Visit | Attending: Urology | Admitting: Urology

## 2022-07-03 DIAGNOSIS — N5089 Other specified disorders of the male genital organs: Secondary | ICD-10-CM | POA: Diagnosis not present

## 2022-07-03 DIAGNOSIS — R93811 Abnormal radiologic findings on diagnostic imaging of right testicle: Secondary | ICD-10-CM

## 2022-07-03 DIAGNOSIS — N433 Hydrocele, unspecified: Secondary | ICD-10-CM | POA: Diagnosis not present

## 2022-07-13 DIAGNOSIS — R93811 Abnormal radiologic findings on diagnostic imaging of right testicle: Secondary | ICD-10-CM | POA: Diagnosis not present

## 2022-07-13 DIAGNOSIS — N401 Enlarged prostate with lower urinary tract symptoms: Secondary | ICD-10-CM | POA: Diagnosis not present

## 2022-07-13 DIAGNOSIS — R3912 Poor urinary stream: Secondary | ICD-10-CM | POA: Diagnosis not present

## 2022-07-13 DIAGNOSIS — N50811 Right testicular pain: Secondary | ICD-10-CM | POA: Diagnosis not present

## 2022-09-17 ENCOUNTER — Encounter: Payer: Self-pay | Admitting: Nurse Practitioner

## 2022-09-17 ENCOUNTER — Ambulatory Visit (INDEPENDENT_AMBULATORY_CARE_PROVIDER_SITE_OTHER): Payer: Medicare HMO | Admitting: Nurse Practitioner

## 2022-09-17 VITALS — BP 122/78 | HR 58 | Temp 97.9°F | Ht 73.0 in | Wt 161.0 lb

## 2022-09-17 DIAGNOSIS — Z7189 Other specified counseling: Secondary | ICD-10-CM

## 2022-09-17 DIAGNOSIS — N401 Enlarged prostate with lower urinary tract symptoms: Secondary | ICD-10-CM

## 2022-09-17 DIAGNOSIS — F1010 Alcohol abuse, uncomplicated: Secondary | ICD-10-CM

## 2022-09-17 DIAGNOSIS — E785 Hyperlipidemia, unspecified: Secondary | ICD-10-CM | POA: Diagnosis not present

## 2022-09-17 DIAGNOSIS — R69 Illness, unspecified: Secondary | ICD-10-CM | POA: Diagnosis not present

## 2022-09-17 DIAGNOSIS — I10 Essential (primary) hypertension: Secondary | ICD-10-CM

## 2022-09-17 DIAGNOSIS — N138 Other obstructive and reflux uropathy: Secondary | ICD-10-CM | POA: Diagnosis not present

## 2022-09-17 DIAGNOSIS — I5022 Chronic systolic (congestive) heart failure: Secondary | ICD-10-CM | POA: Diagnosis not present

## 2022-09-17 DIAGNOSIS — R739 Hyperglycemia, unspecified: Secondary | ICD-10-CM

## 2022-09-17 NOTE — Patient Instructions (Signed)
Shingles vaccine at local pharamcy
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
## 2022-09-18 LAB — CBC WITH DIFFERENTIAL/PLATELET
Absolute Monocytes: 223 cells/uL (ref 200–950)
Basophils Absolute: 10 cells/uL (ref 0–200)
Basophils Relative: 0.4 %
Eosinophils Absolute: 263 cells/uL (ref 15–500)
Eosinophils Relative: 10.5 %
HCT: 37.4 % — ABNORMAL LOW (ref 38.5–50.0)
Hemoglobin: 12.8 g/dL — ABNORMAL LOW (ref 13.2–17.1)
Lymphs Abs: 1270 cells/uL (ref 850–3900)
MCH: 33 pg (ref 27.0–33.0)
MCHC: 34.2 g/dL (ref 32.0–36.0)
MCV: 96.4 fL (ref 80.0–100.0)
MPV: 11 fL (ref 7.5–12.5)
Monocytes Relative: 8.9 %
Neutro Abs: 735 cells/uL — ABNORMAL LOW (ref 1500–7800)
Neutrophils Relative %: 29.4 %
Platelets: 215 10*3/uL (ref 140–400)
RBC: 3.88 10*6/uL — ABNORMAL LOW (ref 4.20–5.80)
RDW: 12.5 % (ref 11.0–15.0)
Total Lymphocyte: 50.8 %
WBC: 2.5 10*3/uL — ABNORMAL LOW (ref 3.8–10.8)

## 2022-09-18 LAB — LIPID PANEL
Cholesterol: 188 mg/dL (ref <200)
HDL: 82 mg/dL (ref >=40)
LDL Cholesterol (Calc): 94 mg/dL (calc)
Non-HDL Cholesterol (Calc): 106 mg/dL (calc) (ref <130)
Total CHOL/HDL Ratio: 2.3 (calc) (ref <5.0)
Triglycerides: 41 mg/dL (ref <150)

## 2022-09-18 LAB — COMPLETE METABOLIC PANEL WITH GFR
AG Ratio: 1.6 (calc) (ref 1.0–2.5)
ALT: 16 U/L (ref 9–46)
AST: 20 U/L (ref 10–35)
Albumin: 4.1 g/dL (ref 3.6–5.1)
Alkaline phosphatase (APISO): 44 U/L (ref 35–144)
BUN: 13 mg/dL (ref 7–25)
CO2: 30 mmol/L (ref 20–32)
Calcium: 9.4 mg/dL (ref 8.6–10.3)
Chloride: 107 mmol/L (ref 98–110)
Creat: 0.82 mg/dL (ref 0.70–1.28)
Globulin: 2.6 g/dL (calc) (ref 1.9–3.7)
Glucose, Bld: 110 mg/dL — ABNORMAL HIGH (ref 65–99)
Potassium: 4.6 mmol/L (ref 3.5–5.3)
Sodium: 141 mmol/L (ref 135–146)
Total Bilirubin: 0.7 mg/dL (ref 0.2–1.2)
Total Protein: 6.7 g/dL (ref 6.1–8.1)
eGFR: 94 mL/min/{1.73_m2} (ref >=60)

## 2022-09-18 LAB — HEMOGLOBIN A1C
Hgb A1c MFr Bld: 5.7 % of total Hgb — ABNORMAL HIGH (ref <5.7)
Mean Plasma Glucose: 117 mg/dL
eAG (mmol/L): 6.5 mmol/L

## 2022-11-08 ENCOUNTER — Telehealth: Payer: Self-pay

## 2022-11-08 NOTE — Telephone Encounter (Signed)
Recall cologuard mailed to patient.

## 2022-11-19 ENCOUNTER — Other Ambulatory Visit (HOSPITAL_COMMUNITY): Payer: Self-pay | Admitting: Urology

## 2022-11-19 DIAGNOSIS — R93811 Abnormal radiologic findings on diagnostic imaging of right testicle: Secondary | ICD-10-CM

## 2022-12-04 ENCOUNTER — Other Ambulatory Visit: Payer: Self-pay | Admitting: Urology

## 2022-12-04 DIAGNOSIS — R93811 Abnormal radiologic findings on diagnostic imaging of right testicle: Secondary | ICD-10-CM

## 2023-02-09 ENCOUNTER — Other Ambulatory Visit: Payer: Self-pay | Admitting: Orthopedic Surgery

## 2023-02-09 DIAGNOSIS — G4700 Insomnia, unspecified: Secondary | ICD-10-CM

## 2023-02-11 ENCOUNTER — Other Ambulatory Visit: Payer: Self-pay

## 2023-02-11 DIAGNOSIS — G4700 Insomnia, unspecified: Secondary | ICD-10-CM

## 2023-02-11 MED ORDER — TRAZODONE HCL 100 MG PO TABS: ORAL_TABLET | ORAL | 5 refills | Status: DC

## 2023-02-11 NOTE — Telephone Encounter (Signed)
Patient's Aunt called stating that patient needs refill on Trazodone 100mg  tablet.   Medication pended and sent to Abbey Chatters, NP

## 2023-03-22 ENCOUNTER — Ambulatory Visit (INDEPENDENT_AMBULATORY_CARE_PROVIDER_SITE_OTHER): Payer: Medicare HMO | Admitting: Nurse Practitioner

## 2023-03-22 ENCOUNTER — Encounter: Payer: Self-pay | Admitting: Nurse Practitioner

## 2023-03-22 VITALS — BP 118/80 | HR 55 | Temp 97.9°F | Ht 73.0 in | Wt 159.0 lb

## 2023-03-22 DIAGNOSIS — F1010 Alcohol abuse, uncomplicated: Secondary | ICD-10-CM | POA: Diagnosis not present

## 2023-03-22 DIAGNOSIS — I10 Essential (primary) hypertension: Secondary | ICD-10-CM

## 2023-03-22 DIAGNOSIS — R1310 Dysphagia, unspecified: Secondary | ICD-10-CM

## 2023-03-22 DIAGNOSIS — M25551 Pain in right hip: Secondary | ICD-10-CM | POA: Diagnosis not present

## 2023-03-22 DIAGNOSIS — N138 Other obstructive and reflux uropathy: Secondary | ICD-10-CM | POA: Diagnosis not present

## 2023-03-22 DIAGNOSIS — I5022 Chronic systolic (congestive) heart failure: Secondary | ICD-10-CM | POA: Diagnosis not present

## 2023-03-22 DIAGNOSIS — E785 Hyperlipidemia, unspecified: Secondary | ICD-10-CM | POA: Diagnosis not present

## 2023-03-22 DIAGNOSIS — N401 Enlarged prostate with lower urinary tract symptoms: Secondary | ICD-10-CM

## 2023-03-22 DIAGNOSIS — Z1211 Encounter for screening for malignant neoplasm of colon: Secondary | ICD-10-CM

## 2023-03-22 DIAGNOSIS — Z1212 Encounter for screening for malignant neoplasm of rectum: Secondary | ICD-10-CM | POA: Diagnosis not present

## 2023-03-22 DIAGNOSIS — R739 Hyperglycemia, unspecified: Secondary | ICD-10-CM | POA: Diagnosis not present
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
## 2023-03-22 NOTE — Patient Instructions (Signed)
To call [facility] [phone] and make appt- overdue for appt.

## 2023-03-23 LAB — CBC WITH DIFFERENTIAL/PLATELET
Absolute Monocytes: 306 cells/uL (ref 200–950)
Basophils Absolute: 21 cells/uL (ref 0–200)
Basophils Relative: 0.7 %
Eosinophils Absolute: 258 cells/uL (ref 15–500)
Eosinophils Relative: 8.6 %
HCT: 37.2 % — ABNORMAL LOW (ref 38.5–50.0)
Hemoglobin: 12.5 g/dL — ABNORMAL LOW (ref 13.2–17.1)
Lymphs Abs: 1125 cells/uL (ref 850–3900)
MCH: 33 pg (ref 27.0–33.0)
MCHC: 33.6 g/dL (ref 32.0–36.0)
MCV: 98.2 fL (ref 80.0–100.0)
MPV: 10.6 fL (ref 7.5–12.5)
Monocytes Relative: 10.2 %
Neutro Abs: 1290 cells/uL — ABNORMAL LOW (ref 1500–7800)
Neutrophils Relative %: 43 %
Platelets: 200 10*3/uL (ref 140–400)
RBC: 3.79 10*6/uL — ABNORMAL LOW (ref 4.20–5.80)
RDW: 11.8 % (ref 11.0–15.0)
Total Lymphocyte: 37.5 %
WBC: 3 10*3/uL — ABNORMAL LOW (ref 3.8–10.8)

## 2023-03-23 LAB — COMPLETE METABOLIC PANEL WITH GFR
AG Ratio: 1.6 (calc) (ref 1.0–2.5)
ALT: 21 U/L (ref 9–46)
AST: 28 U/L (ref 10–35)
Albumin: 3.9 g/dL (ref 3.6–5.1)
Alkaline phosphatase (APISO): 42 U/L (ref 35–144)
BUN: 16 mg/dL (ref 7–25)
CO2: 30 mmol/L (ref 20–32)
Calcium: 9.3 mg/dL (ref 8.6–10.3)
Chloride: 107 mmol/L (ref 98–110)
Creat: 0.81 mg/dL (ref 0.70–1.28)
Globulin: 2.4 g/dL (calc) (ref 1.9–3.7)
Glucose, Bld: 93 mg/dL (ref 65–99)
Potassium: 4.7 mmol/L (ref 3.5–5.3)
Sodium: 141 mmol/L (ref 135–146)
Total Bilirubin: 0.5 mg/dL (ref 0.2–1.2)
Total Protein: 6.3 g/dL (ref 6.1–8.1)
eGFR: 94 mL/min/{1.73_m2} (ref >=60)

## 2023-03-23 LAB — LIPID PANEL
Cholesterol: 181 mg/dL (ref <200)
HDL: 92 mg/dL (ref >=40)
LDL Cholesterol (Calc): 76 mg/dL (calc)
Non-HDL Cholesterol (Calc): 89 mg/dL (calc) (ref <130)
Total CHOL/HDL Ratio: 2 (calc) (ref <5.0)
Triglycerides: 44 mg/dL (ref <150)

## 2023-03-23 LAB — HEMOGLOBIN A1C
Hgb A1c MFr Bld: 5.7 % of total Hgb — ABNORMAL HIGH (ref <5.7)
Mean Plasma Glucose: 117 mg/dL
eAG (mmol/L): 6.5 mmol/L

## 2023-03-25 ENCOUNTER — Encounter: Payer: Self-pay | Admitting: Nurse Practitioner

## 2023-03-25 ENCOUNTER — Other Ambulatory Visit (HOSPITAL_COMMUNITY): Payer: Self-pay | Admitting: *Deleted

## 2023-03-25 ENCOUNTER — Ambulatory Visit: Payer: Medicare HMO | Admitting: Nurse Practitioner

## 2023-03-25 ENCOUNTER — Encounter: Payer: Medicare HMO | Admitting: Nurse Practitioner

## 2023-03-25 DIAGNOSIS — R1310 Dysphagia, unspecified: Secondary | ICD-10-CM

## 2023-03-25 NOTE — Progress Notes (Signed)
err

## 2023-03-26 ENCOUNTER — Encounter: Payer: Medicare HMO | Admitting: Nurse Practitioner

## 2023-03-29 LAB — COLOGUARD

## 2023-04-02 ENCOUNTER — Telehealth: Payer: Medicare HMO | Admitting: Nurse Practitioner

## 2023-04-08 ENCOUNTER — Encounter (HOSPITAL_COMMUNITY): Payer: Medicare HMO

## 2023-04-15 ENCOUNTER — Encounter: Payer: Self-pay | Admitting: Family

## 2023-04-15 ENCOUNTER — Ambulatory Visit (INDEPENDENT_AMBULATORY_CARE_PROVIDER_SITE_OTHER): Payer: Medicare HMO | Admitting: Family

## 2023-04-15 VITALS — BP 130/80 | HR 77 | Temp 97.1°F | Resp 18 | Ht 73.0 in | Wt 161.0 lb

## 2023-04-15 DIAGNOSIS — Z1211 Encounter for screening for malignant neoplasm of colon: Secondary | ICD-10-CM

## 2023-04-15 DIAGNOSIS — Z1212 Encounter for screening for malignant neoplasm of rectum: Secondary | ICD-10-CM

## 2023-04-15 DIAGNOSIS — Z0000 Encounter for general adult medical examination without abnormal findings: Secondary | ICD-10-CM | POA: Diagnosis not present

## 2023-04-15 DIAGNOSIS — H543 Unqualified visual loss, both eyes: Secondary | ICD-10-CM | POA: Diagnosis not present

## 2023-04-15 NOTE — Patient Instructions (Signed)
[name] , Thank you for taking time to come for your Medicare Wellness Visit. I appreciate your ongoing commitment to your health goals. Please review the following plan we discussed and let me know if I can assist you in the future.   Screening recommendations/referrals: Colonoscopy : Cologuard reordered  Recommended yearly ophthalmology/optometry visit for glaucoma screening and checkup Recommended yearly dental visit for hygiene and checkup  Vaccinations: Influenza vaccine due annually in September/October Pneumococcal vaccine : Up to date  Tdap vaccine : Up to date  Shingles vaccine : Due please get vaccine at the pharmacy     Advanced directives: No   Conditions/risks identified:  advanced age (>48men, >13 women);male gender;hypertension;smoking/ tobacco exposure  Next appointment: 1 year   Preventive Care 71 Years and Older, Male Preventive care refers to lifestyle choices and visits with your health care provider that can promote health and wellness. What does preventive care include? A yearly physical exam. This is also called an annual well check. Dental exams once or twice a year. Routine eye exams. Ask your health care provider how often you should have your eyes checked. Personal lifestyle choices, including: Daily care of your teeth and gums. Regular physical activity. Eating a healthy diet. Avoiding tobacco and drug use. Limiting alcohol use. Practicing safe sex. Taking low doses of aspirin every day. Taking vitamin and mineral supplements as recommended by your health care provider. What happens during an annual well check? The services and screenings done by your health care provider during your annual well check will depend on your age, overall health, lifestyle risk factors, and family history of disease. Counseling  Your health care provider may ask you questions about your: Alcohol use. Tobacco use. Drug use. Emotional well-being. Home and  relationship well-being. Sexual activity. Eating habits. History of falls. Memory and ability to understand (cognition). Work and work Astronomer. Screening  You may have the following tests or measurements: Height, weight, and BMI. Blood pressure. Lipid and cholesterol levels. These may be checked every 5 years, or more frequently if you are over 54 years old. Skin check. Lung cancer screening. You may have this screening every year starting at age 72 if you have a 30-pack-year history of smoking and currently smoke or have quit within the past 15 years. Fecal occult blood test (FOBT) of the stool. You may have this test every year starting at age 32. Flexible sigmoidoscopy or colonoscopy. You may have a sigmoidoscopy every 5 years or a colonoscopy every 10 years starting at age 77. Prostate cancer screening. Recommendations will vary depending on your family history and other risks. Hepatitis C blood test. Hepatitis B blood test. Sexually transmitted disease (STD) testing. Diabetes screening. This is done by checking your blood sugar (glucose) after you have not eaten for a while (fasting). You may have this done every 1-3 years. Abdominal aortic aneurysm (AAA) screening. You may need this if you are a current or former smoker. Osteoporosis. You may be screened starting at age 104 if you are at high risk. Talk with your health care provider about your test results, treatment options, and if necessary, the need for more tests. Vaccines  Your health care provider may recommend certain vaccines, such as: Influenza vaccine. This is recommended every year. Tetanus, diphtheria, and acellular pertussis (Tdap, Td) vaccine. You may need a Td booster every 10 years. Zoster vaccine. You may need this after age 11. Pneumococcal 13-valent conjugate (PCV13) vaccine. One dose is recommended after age 37. Pneumococcal polysaccharide (  PPSV23) vaccine. One dose is recommended after age 73. Talk to your  health care provider about which screenings and vaccines you need and how often you need them. This information is not intended to replace advice given to you by your health care provider. Make sure you discuss any questions you have with your health care provider. Document Released: 10/14/2015 Document Revised: 06/06/2016 Document Reviewed: 07/19/2015 Elsevier Interactive Patient Education  2017 [employer].  Fall Prevention in the Home Falls can cause injuries. They can happen to people of all ages. There are many things you can do to make your home safe and to help prevent falls. What can I do on the outside of my home? Regularly fix the edges of walkways and driveways and fix any cracks. Remove anything that might make you trip as you walk through a door, such as a raised step or threshold. Trim any bushes or trees on the path to your home. Use bright outdoor lighting. Clear any walking paths of anything that might make someone trip, such as rocks or tools. Regularly check to see if handrails are loose or broken. Make sure that both sides of any steps have handrails. Any raised decks and porches should have guardrails on the edges. Have any leaves, snow, or ice cleared regularly. Use sand or salt on walking paths during winter. Clean up any spills in your garage right away. This includes oil or grease spills. What can I do in the bathroom? Use night lights. Install grab bars by the toilet and in the tub and shower. Do not use towel bars as grab bars. Use non-skid mats or decals in the tub or shower. If you need to sit down in the shower, use a plastic, non-slip stool. Keep the floor dry. Clean up any water that spills on the floor as soon as it happens. Remove soap buildup in the tub or shower regularly. Attach bath mats securely with double-sided non-slip rug tape. Do not have throw rugs and other things on the floor that can make you trip. What can I do in the bedroom? Use night  lights. Make sure that you have a light by your bed that is easy to reach. Do not use any sheets or blankets that are too big for your bed. They should not hang down onto the floor. Have a firm chair that has side arms. You can use this for support while you get dressed. Do not have throw rugs and other things on the floor that can make you trip. What can I do in the kitchen? Clean up any spills right away. Avoid walking on wet floors. Keep items that you use a lot in easy-to-reach places. If you need to reach something above you, use a strong step stool that has a grab bar. Keep electrical cords out of the way. Do not use floor polish or wax that makes floors slippery. If you must use wax, use non-skid floor wax. Do not have throw rugs and other things on the floor that can make you trip. What can I do with my stairs? Do not leave any items on the stairs. Make sure that there are handrails on both sides of the stairs and use them. Fix handrails that are broken or loose. Make sure that handrails are as long as the stairways. Check any carpeting to make sure that it is firmly attached to the stairs. Fix any carpet that is loose or worn. Avoid having throw rugs at the top or  bottom of the stairs. If you do have throw rugs, attach them to the floor with carpet tape. Make sure that you have a light switch at the top of the stairs and the bottom of the stairs. If you do not have them, ask someone to add them for you. What else can I do to help prevent falls? Wear shoes that: Do not have high heels. Have rubber bottoms. Are comfortable and fit you well. Are closed at the toe. Do not wear sandals. If you use a stepladder: Make sure that it is fully opened. Do not climb a closed stepladder. Make sure that both sides of the stepladder are locked into place. Ask someone to hold it for you, if possible. Clearly mark and make sure that you can see: Any grab bars or handrails. First and last  steps. Where the edge of each step is. Use tools that help you move around (mobility aids) if they are needed. These include: Canes. Walkers. Scooters. Crutches. Turn on the lights when you go into a dark area. Replace any light bulbs as soon as they burn out. Set up your furniture so you have a clear path. Avoid moving your furniture around. If any of your floors are uneven, fix them. If there are any pets around you, be aware of where they are. Review your medicines with your doctor. Some medicines can make you feel dizzy. This can increase your chance of falling. Ask your doctor what other things that you can do to help prevent falls. This information is not intended to replace advice given to you by your health care provider. Make sure you discuss any questions you have with your health care provider. Document Released: 07/14/2009 Document Revised: 02/23/2016 Document Reviewed: 10/22/2014 Elsevier Interactive Patient Education  2017 [employer].
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
## 2023-04-16 ENCOUNTER — Ambulatory Visit (HOSPITAL_COMMUNITY)
Admission: RE | Admit: 2023-04-16 | Discharge: 2023-04-16 | Disposition: A | Discharge status: Home / Self Care | Payer: Medicare HMO | Source: Ambulatory Visit | Attending: Nurse Practitioner | Admitting: Nurse Practitioner

## 2023-04-16 DIAGNOSIS — R208 Other disturbances of skin sensation: Secondary | ICD-10-CM | POA: Diagnosis not present

## 2023-04-16 DIAGNOSIS — R1310 Dysphagia, unspecified: Secondary | ICD-10-CM | POA: Diagnosis not present

## 2023-04-16 DIAGNOSIS — I5022 Chronic systolic (congestive) heart failure: Secondary | ICD-10-CM | POA: Insufficient documentation

## 2023-04-16 NOTE — Progress Notes (Signed)
Modified Barium Swallow Study  Patient Details  Name: [name] MRN: [id number] Date of Birth: 1951/05/22  Today's Date: 04/16/2023  Modified Barium Swallow completed.  Full report located under Chart Review in the Imaging Section.  History of Present Illness Pt is a 72 yo male presenting for OP MBS due to coughing and CP when eating. PMH includes: GERD, polysubstance use, self-reported intellectual disability, CHF, HTN; UGI 2000 revealed a small sliding HH   Clinical Impression Pt has a functional appearing oropharyngeal swallow. He does have mildly reduced PES opening with possible impact from suspected cervical osteophytes. There is minimal pharyngeal residue though and no aspiration or penetration occurs across all consistencies tested. Recommend continuing with regular solids and thin liquids. Encouraged pt to try slowing his rate of intake as he reports coughing sometimes at home when he drinks too quickly. Factors that may increase risk of adverse event in presence of aspiration Rubye Oaks & Clearance Coots 2021): Reduced cognitive function  Swallow Evaluation Recommendations Recommendations: PO diet PO Diet Recommendation: Regular;Thin liquids (Level 0) Liquid Administration via: Cup;Straw Medication Administration: Whole meds with liquid Supervision: Patient able to self-feed Swallowing strategies  : Slow rate Postural changes: Position pt fully upright for meals Oral care recommendations: Oral care BID (2x/day)      Mahala Menghini., M.A. CCC-SLP Acute Rehabilitation Services Office [phone]  Secure chat preferred  04/16/2023,4:00 PM

## 2023-04-26 ENCOUNTER — Ambulatory Visit
Admission: RE | Admit: 2023-04-26 | Discharge: 2023-04-26 | Disposition: A | Discharge status: Home / Self Care | Payer: Medicare HMO | Source: Ambulatory Visit | Attending: Urology | Admitting: Urology

## 2023-04-26 DIAGNOSIS — R93811 Abnormal radiologic findings on diagnostic imaging of right testicle: Secondary | ICD-10-CM

## 2023-04-26 DIAGNOSIS — N433 Hydrocele, unspecified: Secondary | ICD-10-CM | POA: Diagnosis not present

## 2023-04-27 ENCOUNTER — Other Ambulatory Visit: Payer: Self-pay | Admitting: Orthopedic Surgery

## 2023-04-27 DIAGNOSIS — E785 Hyperlipidemia, unspecified: Secondary | ICD-10-CM

## 2023-05-10 ENCOUNTER — Encounter: Payer: Self-pay | Admitting: Family Medicine

## 2023-05-13 ENCOUNTER — Encounter: Payer: Self-pay | Admitting: Family Medicine
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
## 2023-07-16 ENCOUNTER — Other Ambulatory Visit: Payer: Self-pay | Admitting: Internal Medicine

## 2023-07-16 ENCOUNTER — Ambulatory Visit (INDEPENDENT_AMBULATORY_CARE_PROVIDER_SITE_OTHER): Payer: Medicare HMO

## 2023-07-16 ENCOUNTER — Other Ambulatory Visit: Payer: Self-pay

## 2023-07-16 ENCOUNTER — Ambulatory Visit: Payer: Medicare HMO | Attending: Internal Medicine | Admitting: Internal Medicine

## 2023-07-16 ENCOUNTER — Encounter: Payer: Self-pay | Admitting: Internal Medicine

## 2023-07-16 VITALS — BP 120/84 | HR 51 | Ht 73.0 in | Wt 159.6 lb

## 2023-07-16 DIAGNOSIS — I10 Essential (primary) hypertension: Secondary | ICD-10-CM

## 2023-07-16 DIAGNOSIS — R5383 Other fatigue: Secondary | ICD-10-CM

## 2023-07-16 DIAGNOSIS — I5022 Chronic systolic (congestive) heart failure: Secondary | ICD-10-CM

## 2023-07-16 DIAGNOSIS — R002 Palpitations: Secondary | ICD-10-CM

## 2023-07-16 DIAGNOSIS — I428 Other cardiomyopathies: Secondary | ICD-10-CM | POA: Diagnosis not present

## 2023-07-16 NOTE — Patient Instructions (Addendum)
Medication Instructions:   *If you need a refill on your cardiac medications before your next appointment, please call your pharmacy*   Lab Work:  If you have labs (blood work) drawn today and your tests are completely normal, you will receive your results only by: MyChart Message (if you have MyChart) OR A paper copy in the mail If you have any lab test that is abnormal or we need to change your treatment, we will call you to review the results.   Testing/Procedures:  Your physician has requested that you have an echocardiogram. Echocardiography is a painless test that uses sound waves to create images of your heart. It provides your doctor with information about the size and shape of your heart and how well your heart's chambers and valves are working. This procedure takes approximately one hour. There are no restrictions for this procedure. Please do NOT wear cologne, perfume, aftershave, or lotions (deodorant is allowed). Please arrive 15 minutes prior to your appointment time.    ZIO XT- Long Term Monitor Instructions  Your physician has requested you wear a ZIO patch monitor for 7 days.  This is a single patch monitor. [employer] supplies one patch monitor per enrollment. Additional stickers are not available. Please do not apply patch if you will be having a Nuclear Stress Test,  Echocardiogram, Cardiac CT, MRI, or Chest Xray during the period you would be wearing the  monitor. The patch cannot be worn during these tests. You cannot remove and re-apply the  ZIO XT patch monitor.  Your ZIO patch monitor will be mailed 3 day USPS to your address on file. It may take 3-5 days  to receive your monitor after you have been enrolled.  Once you have received your monitor, please review the enclosed instructions. Your monitor  has already been registered assigning a specific monitor serial # to you.  Billing and Patient Assistance Program Information  We have supplied [employer] with any  of your insurance information on file for billing purposes. [employer] offers a sliding scale Patient Assistance Program for patients that do not have  insurance, or whose insurance does not completely cover the cost of the ZIO monitor.  You must apply for the Patient Assistance Program to qualify for this discounted rate.  To apply, please call [employer] at [phone], select option 4, select option 2, ask to apply for  Patient Assistance Program. Meredeth Ide will ask your household income, and how many people  are in your household. They will quote your out-of-pocket cost based on that information.  [employer] will also be able to set up a 60-month, interest-free payment plan if needed.  Applying the monitor   Shave hair from upper left chest.  Hold abrader disc by orange tab. Rub abrader in 40 strokes over the upper left chest as  indicated in your monitor instructions.  Clean area with 4 enclosed alcohol pads. Let dry.  Apply patch as indicated in monitor instructions. Patch will be placed under collarbone on left  side of chest with arrow pointing upward.  Rub patch adhesive wings for 2 minutes. Remove white label marked "1". Remove the white  label marked "2". Rub patch adhesive wings for 2 additional minutes.  While looking in a mirror, press and release button in center of patch. A small green light will  flash 3-4 times. This will be your only indicator that the monitor has been turned on.  Do not shower for the first 24 hours. You may shower after the  first 24 hours.  Press the button if you feel a symptom. You will hear a small click. Record Date, Time and  Symptom in the Patient Logbook.  When you are ready to remove the patch, follow instructions on the last 2 pages of Patient  Logbook. Stick patch monitor onto the last page of Patient Logbook.  Place Patient Logbook in the blue and white box. Use locking tab on box and tape box closed  securely. The blue and white box has prepaid  postage on it. Please place it in the mailbox as  soon as possible. Your physician should have your test results approximately 7 days after the  monitor has been mailed back to [facility].  Call [facility] Customer Care at [phone] if you have questions regarding  your ZIO XT patch monitor. Call them immediately if you see an orange light blinking on your  monitor.  If your monitor falls off in less than 4 days, contact our Monitor department at [phone].  If your monitor becomes loose or falls off after 4 days call [employer] at [phone] for  suggestions on securing your monitor    Follow-Up: At [facility], you and your health needs are our priority.  As part of our continuing mission to provide you with exceptional heart care, we have created designated Provider Care Teams.  These Care Teams include your primary Cardiologist (physician) and Advanced Practice Providers (APPs -  Physician Assistants and Nurse Practitioners) who all work together to provide you with the care you need, when you need it.  We recommend signing up for the patient portal called "MyChart".  Sign up information is provided on this After Visit Summary.  MyChart is used to connect with patients for Virtual Visits (Telemedicine).  Patients are able to view lab/test results, encounter notes, upcoming appointments, etc.  Non-urgent messages can be sent to your provider as well.   To learn more about what you can do with MyChart, go to [email or web address].    Your next appointment:  April/ May

## 2023-07-16 NOTE — Progress Notes (Unsigned)
ZIO XT serial # [id number] from office inventory applied to patient.

## 2023-07-29 DIAGNOSIS — R002 Palpitations: Secondary | ICD-10-CM | POA: Diagnosis not present

## 2023-08-02 ENCOUNTER — Other Ambulatory Visit: Payer: Self-pay | Admitting: Orthopedic Surgery

## 2023-08-02 NOTE — Telephone Encounter (Signed)
It states that patient medication ended. Medication pend and sent to PCP Janyth Contes Janene Harvey, NP

## 2023-08-06 DIAGNOSIS — H52203 Unspecified astigmatism, bilateral: Secondary | ICD-10-CM | POA: Diagnosis not present

## 2023-08-06 DIAGNOSIS — H52223 Regular astigmatism, bilateral: Secondary | ICD-10-CM | POA: Diagnosis not present

## 2023-08-06 DIAGNOSIS — H25813 Combined forms of age-related cataract, bilateral: Secondary | ICD-10-CM | POA: Diagnosis not present

## 2023-08-06 DIAGNOSIS — H5203 Hypermetropia, bilateral: Secondary | ICD-10-CM | POA: Diagnosis not present

## 2023-08-06 DIAGNOSIS — H4323 Crystalline deposits in vitreous body, bilateral: Secondary | ICD-10-CM | POA: Diagnosis not present

## 2023-08-06 DIAGNOSIS — H524 Presbyopia: Secondary | ICD-10-CM | POA: Diagnosis not present

## 2023-08-09 ENCOUNTER — Ambulatory Visit: Payer: Medicare HMO | Admitting: Internal Medicine

## 2023-08-15 ENCOUNTER — Ambulatory Visit (HOSPITAL_COMMUNITY): Payer: Medicare HMO | Attending: Cardiology

## 2023-08-15 DIAGNOSIS — R002 Palpitations: Secondary | ICD-10-CM | POA: Insufficient documentation

## 2023-08-15 DIAGNOSIS — I5022 Chronic systolic (congestive) heart failure: Secondary | ICD-10-CM | POA: Insufficient documentation

## 2023-08-15 DIAGNOSIS — I10 Essential (primary) hypertension: Secondary | ICD-10-CM | POA: Diagnosis not present

## 2023-08-15 DIAGNOSIS — R5383 Other fatigue: Secondary | ICD-10-CM | POA: Diagnosis not present

## 2023-08-15 DIAGNOSIS — I428 Other cardiomyopathies: Secondary | ICD-10-CM | POA: Insufficient documentation

## 2023-08-15 LAB — ECHOCARDIOGRAM COMPLETE
Area-P 1/2: 2.87 cm2
S' Lateral: 4.2 cm

## 2023-08-18 ENCOUNTER — Other Ambulatory Visit: Payer: Self-pay | Admitting: Nurse Practitioner

## 2023-08-18 DIAGNOSIS — G4700 Insomnia, unspecified: Secondary | ICD-10-CM

## 2023-08-20 ENCOUNTER — Other Ambulatory Visit: Payer: Self-pay

## 2023-08-20 DIAGNOSIS — Z79899 Other long term (current) drug therapy: Secondary | ICD-10-CM

## 2023-08-20 DIAGNOSIS — I5022 Chronic systolic (congestive) heart failure: Secondary | ICD-10-CM

## 2023-08-20 DIAGNOSIS — I10 Essential (primary) hypertension: Secondary | ICD-10-CM

## 2023-08-20 MED ORDER — LOSARTAN POTASSIUM 25 MG PO TABS: 25.0000 mg | ORAL_TABLET | Freq: Every day | ORAL | 3 refills | Status: DC

## 2023-09-05 ENCOUNTER — Telehealth: Payer: Self-pay | Admitting: Internal Medicine

## 2023-09-05 NOTE — Telephone Encounter (Signed)
Pt started Losartan 2 weeks ago and will come by this week for his labwork.

## 2023-09-05 NOTE — Telephone Encounter (Signed)
Patient's aunt states she is returning a call, but she does not know what it was in regards to.

## 2023-09-12 ENCOUNTER — Ambulatory Visit (HOSPITAL_COMMUNITY): Payer: Medicare HMO

## 2023-09-12 ENCOUNTER — Encounter (HOSPITAL_COMMUNITY): Payer: Self-pay | Admitting: *Deleted

## 2023-09-12 ENCOUNTER — Ambulatory Visit (HOSPITAL_COMMUNITY)
Admission: EM | Admit: 2023-09-12 | Discharge: 2023-09-12 | Disposition: A | Discharge status: Home / Self Care | Payer: Medicare HMO | Attending: Internal Medicine | Admitting: Internal Medicine

## 2023-09-12 DIAGNOSIS — S61211A Laceration without foreign body of left index finger without damage to nail, initial encounter: Secondary | ICD-10-CM | POA: Diagnosis not present

## 2023-09-12 DIAGNOSIS — Z23 Encounter for immunization: Secondary | ICD-10-CM

## 2023-09-12 MED ORDER — AMOXICILLIN-POT CLAVULANATE 875-125 MG PO TABS: 1.0000 | ORAL_TABLET | Freq: Two times a day (BID) | ORAL | 0 refills | Status: DC

## 2023-09-12 MED ORDER — TETANUS-DIPHTH-ACELL PERTUSSIS 5-2.5-18.5 LF-MCG/0.5 IM SUSY
PREFILLED_SYRINGE | INTRAMUSCULAR | Status: AC
  Filled 2023-09-12: qty 0.5

## 2023-09-12 MED ORDER — IBUPROFEN 600 MG PO TABS: 600.0000 mg | ORAL_TABLET | Freq: Four times a day (QID) | ORAL | 0 refills | Status: DC | PRN

## 2023-09-12 MED ORDER — TETANUS-DIPHTH-ACELL PERTUSSIS 5-2.5-18.5 LF-MCG/0.5 IM SUSY
0.5000 mL | PREFILLED_SYRINGE | Freq: Once | INTRAMUSCULAR | Status: AC
  Administered 2023-09-12: 0.5 mL via INTRAMUSCULAR

## 2023-09-12 MED ORDER — BACITRACIN ZINC 500 UNIT/GM EX OINT: 1.0000 | TOPICAL_OINTMENT | Freq: Two times a day (BID) | CUTANEOUS | 0 refills | Status: DC
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
## 2023-09-12 NOTE — ED Triage Notes (Addendum)
Pt states he cut his left pointer finger on a skill saw yesterday work. He has wrapped the finger to stop bleeding.   Last TDAP unknown

## 2023-09-12 NOTE — Discharge Instructions (Addendum)
Daily wound dressing changes with topical antibiotic ointment Please take antibiotics as prescribed Elevate the hand to reduce swelling Please avoid using peroxide when changing the dressing Please follow-up with the hand surgeon sometime early next week. If you notice worsening redness, swelling, pain or discharge please return to urgent care or call the hand surgeon to be seen sooner.

## 2023-09-15 ENCOUNTER — Other Ambulatory Visit: Payer: Self-pay

## 2023-09-15 ENCOUNTER — Encounter (HOSPITAL_COMMUNITY): Payer: Self-pay

## 2023-09-15 ENCOUNTER — Emergency Department (HOSPITAL_COMMUNITY)
Admission: EM | Admit: 2023-09-15 | Discharge: 2023-09-15 | Disposition: A | Discharge status: Home / Self Care | Payer: Medicare HMO | Attending: Emergency Medicine | Admitting: Emergency Medicine

## 2023-09-15 DIAGNOSIS — W312XXA Contact with powered woodworking and forming machines, initial encounter: Secondary | ICD-10-CM | POA: Diagnosis not present

## 2023-09-15 DIAGNOSIS — Z7982 Long term (current) use of aspirin: Secondary | ICD-10-CM | POA: Diagnosis not present

## 2023-09-15 DIAGNOSIS — S6992XA Unspecified injury of left wrist, hand and finger(s), initial encounter: Secondary | ICD-10-CM | POA: Diagnosis not present

## 2023-09-15 DIAGNOSIS — S61211D Laceration without foreign body of left index finger without damage to nail, subsequent encounter: Secondary | ICD-10-CM

## 2023-09-15 DIAGNOSIS — S61211A Laceration without foreign body of left index finger without damage to nail, initial encounter: Secondary | ICD-10-CM | POA: Diagnosis not present

## 2023-09-15 MED ORDER — ACETAMINOPHEN 500 MG PO TABS
1000.0000 mg | ORAL_TABLET | Freq: Once | ORAL | Status: AC
  Administered 2023-09-15: 1000 mg via ORAL
  Filled 2023-09-15: qty 2

## 2023-09-15 NOTE — ED Notes (Signed)
This RN reviewed discharge instructions with patient. He verbalized understanding and denied any further questions. PT well appearing upon discharge and reports tolerable pain. Pt ambulated with stable gait to exit. Pt endorses ride home. With family.

## 2023-09-15 NOTE — ED Provider Notes (Signed)
EMERGENCY DEPARTMENT AT [facility] Provider Note   CSN: [id number] Arrival date & time: 09/15/23  0750     History  Chief Complaint  Patient presents with   Extremity Laceration   HPI [name] is a 72 y.o. male presenting for left index finger laceration.  Occurred Monday.  Accidentally cut it with a skill saw.  Went to urgent care the following day.  X-ray at that time was negative.  He was given Tdap.  Also started on Augmentin and ibuprofen for pain.  Patient is here because the finger is still sore and he is requesting a note to return to work.  Denies any oozing or bleeding and states that the swelling in the finger has improved.  HPI     Home Medications Prior to Admission medications   Medication Sig Start Date End Date Taking? Authorizing Provider  acetaminophen (TYLENOL) 325 MG tablet Take 2 tablets (650 mg total) by mouth every 6 (six) hours as needed for moderate pain or mild pain (headache). 09/08/21   Juliet Rude, PA-C  amoxicillin-clavulanate (AUGMENTIN) 875-125 MG tablet Take 1 tablet by mouth every 12 (twelve) hours. 09/12/23   Merrilee Jansky, MD  aspirin EC 81 MG tablet Take 1 tablet (81 mg total) by mouth daily. 04/28/21   Alver Sorrow, NP  bacitracin ointment Apply 1 Application topically 2 (two) times daily. 09/12/23   Merrilee Jansky, MD  Blood Pressure Monitoring (BLOOD PRESSURE MONITOR/L CUFF) MISC 1 application by Does not apply route daily. 09/11/21   Ngetich, Dinah C, NP  carvedilol (COREG) 3.125 MG tablet TAKE 1 TABLET(3.125 MG) BY MOUTH TWICE DAILY 08/02/23   Sharon Seller, NP  ibuprofen (ADVIL) 600 MG tablet Take 1 tablet (600 mg total) by mouth every 6 (six) hours as needed. 09/12/23   Merrilee Jansky, MD  losartan (COZAAR) 25 MG tablet Take 1 tablet (25 mg total) by mouth daily. 08/20/23   Pricilla Riffle, MD  OVER THE COUNTER MEDICATION as needed. Eye itch Relief    [provider]  rosuvastatin  (CRESTOR) 5 MG tablet TAKE 1 TABLET(5 MG) BY MOUTH DAILY 04/29/23   Sharon Seller, NP  traZODone (DESYREL) 100 MG tablet TAKE 1 TABLET(100 MG) BY MOUTH AT BEDTIME AS NEEDED FOR SLEEP 08/19/23   Sharon Seller, NP  vitamin B-12 (CYANOCOBALAMIN) 1000 MCG tablet Take 1,000 mcg by mouth daily.    [provider]      Allergies    Patient has no known allergies.    Review of Systems   See HPI  Physical Exam Updated Vital Signs BP (!) 148/89   Pulse (!) 56   Temp 97.6 F (36.4 C)   Resp 18   Ht 6\' 1"  (1.854 m)   Wt 72.6 kg   SpO2 100%   BMI 21.11 kg/m  Physical Exam Constitutional:      Appearance: Normal appearance.  HENT:     Head: Normocephalic.     Nose: Nose normal.  Eyes:     Conjunctiva/sclera: Conjunctivae normal.  Pulmonary:     Effort: Pulmonary effort is normal.  Musculoskeletal:     Comments: Laceration noted to the distal tip of the left index finger about the volar aspect.  Extensive scar tissue noted.  Not tender to touch.  No edema or erythema noted.  Sensation intact.  Radial and ulnar pulses are 2+ bilaterally.  Neurological:     Mental Status: He is  alert.  Psychiatric:        Mood and Affect: Mood normal.     ED Results / Procedures / Treatments   Labs (all labs ordered are listed, but only abnormal results are displayed) Labs Reviewed - No data to display  EKG None  Radiology No results found.  Procedures Procedures    Medications Ordered in ED Medications  acetaminophen (TYLENOL) tablet 1,000 mg (has no administration in time range)    ED Course/ Medical Decision Making/ A&P                                 Medical Decision Making  72 year old well-appearing male presenting for finger laceration.  Exam notable for left index finger laceration appears to be healing well.  No signs of infection at this time.  Advised him to continue taking Augmentin and ibuprofen for pain.  Patient also received Tdap 3 days ago.  X-ray  negative at that time as well.  Also advised he could take Tylenol and apply ice as needed to help with pain as well.  Provided a work note for him to return to work provided that he has not used the index finger for at least another week and follow-up with his PCP.  Vitals are stable.  Discharged home in good condition.        Final Clinical Impression(s) / ED Diagnoses Final diagnoses:  Laceration of left index finger, foreign body presence unspecified, nail damage status unspecified, subsequent encounter    Rx / DC Orders ED Discharge Orders     None         Gareth Eagle, PA-C 09/15/23 0835    Rozelle Logan, DO 09/15/23 1433

## 2023-09-15 NOTE — Discharge Instructions (Signed)
Evaluation today was overall reassuring.  Recommend you continue taking Augmentin and ibuprofen as prescribed.  Also recommend ice and Tylenol.  Please follow-up with your PCP in a few days to evaluate your symptoms and assess wound healing.  If you have any worsening swelling, start to ooze pustulant discharge, develop a fever or any other concerning symptom please return emergency department further evaluation.  In the meantime continue cleaning the wound daily with soap and water and recommend covering the wound while you are at work.

## 2023-09-15 NOTE — ED Triage Notes (Signed)
Pt cut his left index finger on a skill saw Monday. [facility] was seen at [facility] the following day, negative xray, given tdap and abx rx. Pt returns due to it being sore

## 2023-09-23 ENCOUNTER — Ambulatory Visit (INDEPENDENT_AMBULATORY_CARE_PROVIDER_SITE_OTHER): Payer: Medicare HMO | Admitting: Nurse Practitioner

## 2023-09-23 ENCOUNTER — Encounter: Payer: Self-pay | Admitting: Nurse Practitioner

## 2023-09-23 VITALS — BP 128/76 | HR 56 | Temp 97.9°F | Ht 73.0 in | Wt 163.0 lb

## 2023-09-23 DIAGNOSIS — R1310 Dysphagia, unspecified: Secondary | ICD-10-CM

## 2023-09-23 DIAGNOSIS — I5022 Chronic systolic (congestive) heart failure: Secondary | ICD-10-CM

## 2023-09-23 DIAGNOSIS — E785 Hyperlipidemia, unspecified: Secondary | ICD-10-CM

## 2023-09-23 DIAGNOSIS — I700 Atherosclerosis of aorta: Secondary | ICD-10-CM

## 2023-09-23 DIAGNOSIS — R739 Hyperglycemia, unspecified: Secondary | ICD-10-CM | POA: Diagnosis not present

## 2023-09-23 DIAGNOSIS — I10 Essential (primary) hypertension: Secondary | ICD-10-CM | POA: Diagnosis not present

## 2023-09-23 DIAGNOSIS — F1010 Alcohol abuse, uncomplicated: Secondary | ICD-10-CM

## 2023-09-23 DIAGNOSIS — Z1211 Encounter for screening for malignant neoplasm of colon: Secondary | ICD-10-CM | POA: Diagnosis not present

## 2023-09-23 DIAGNOSIS — N138 Other obstructive and reflux uropathy: Secondary | ICD-10-CM | POA: Diagnosis not present

## 2023-09-23 DIAGNOSIS — Z1212 Encounter for screening for malignant neoplasm of rectum: Secondary | ICD-10-CM | POA: Diagnosis not present

## 2023-09-23 DIAGNOSIS — N401 Enlarged prostate with lower urinary tract symptoms: Secondary | ICD-10-CM | POA: Diagnosis not present

## 2023-09-23 DIAGNOSIS — F325 Major depressive disorder, single episode, in full remission: Secondary | ICD-10-CM | POA: Diagnosis not present

## 2023-09-23 NOTE — Patient Instructions (Signed)
to avoid NSAIDS (Aleve, Advil, Motrin, Ibuprofen)  To use tylenol instead 325 mg 2 tablets every 6 hours as needed.
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
## 2023-09-24 LAB — COMPLETE METABOLIC PANEL WITH GFR
AG Ratio: 1.5 (calc) (ref 1.0–2.5)
ALT: 10 U/L (ref 9–46)
AST: 15 U/L (ref 10–35)
Albumin: 4.2 g/dL (ref 3.6–5.1)
Alkaline phosphatase (APISO): 42 U/L (ref 35–144)
BUN: 14 mg/dL (ref 7–25)
CO2: 28 mmol/L (ref 20–32)
Calcium: 9.3 mg/dL (ref 8.6–10.3)
Chloride: 105 mmol/L (ref 98–110)
Creat: 0.71 mg/dL (ref 0.70–1.28)
Globulin: 2.8 g/dL (ref 1.9–3.7)
Glucose, Bld: 104 mg/dL — ABNORMAL HIGH (ref 65–99)
Potassium: 4.3 mmol/L (ref 3.5–5.3)
Sodium: 139 mmol/L (ref 135–146)
Total Bilirubin: 0.6 mg/dL (ref 0.2–1.2)
Total Protein: 7 g/dL (ref 6.1–8.1)
eGFR: 98 mL/min/{1.73_m2} (ref >=60)

## 2023-09-24 LAB — PSA: PSA: 1.09 ng/mL (ref <=4.00)

## 2023-09-24 LAB — HEMOGLOBIN A1C
Hgb A1c MFr Bld: 6.1 %{Hb} — ABNORMAL HIGH (ref <5.7)
Mean Plasma Glucose: 128 mg/dL
eAG (mmol/L): 7.1 mmol/L

## 2023-09-24 LAB — CBC WITH DIFFERENTIAL/PLATELET
Absolute Lymphocytes: 1114 {cells}/uL (ref 850–3900)
Absolute Monocytes: 192 {cells}/uL — ABNORMAL LOW (ref 200–950)
Basophils Absolute: 10 {cells}/uL (ref 0–200)
Basophils Relative: 0.4 %
Eosinophils Absolute: 283 {cells}/uL (ref 15–500)
Eosinophils Relative: 11.8 %
HCT: 40.7 % (ref 38.5–50.0)
Hemoglobin: 13.6 g/dL (ref 13.2–17.1)
MCH: 32.7 pg (ref 27.0–33.0)
MCHC: 33.4 g/dL (ref 32.0–36.0)
MCV: 97.8 fL (ref 80.0–100.0)
MPV: 11.1 fL (ref 7.5–12.5)
Monocytes Relative: 8 %
Neutro Abs: 802 {cells}/uL — ABNORMAL LOW (ref 1500–7800)
Neutrophils Relative %: 33.4 %
Platelets: 216 10*3/uL (ref 140–400)
RBC: 4.16 10*6/uL — ABNORMAL LOW (ref 4.20–5.80)
RDW: 11.9 % (ref 11.0–15.0)
Total Lymphocyte: 46.4 %
WBC: 2.4 10*3/uL — ABNORMAL LOW (ref 3.8–10.8)

## 2023-10-10 LAB — COLOGUARD: COLOGUARD: NEGATIVE

## 2023-10-21 ENCOUNTER — Ambulatory Visit: Payer: Medicare HMO | Admitting: Orthopedic Surgery

## 2023-11-05 ENCOUNTER — Other Ambulatory Visit: Payer: Self-pay | Admitting: Nurse Practitioner

## 2023-11-05 DIAGNOSIS — E785 Hyperlipidemia, unspecified: Secondary | ICD-10-CM

## 2024-02-10 ENCOUNTER — Other Ambulatory Visit: Payer: Self-pay | Admitting: Nurse Practitioner

## 2024-02-10 DIAGNOSIS — G4700 Insomnia, unspecified: Secondary | ICD-10-CM

## 2024-02-18 ENCOUNTER — Ambulatory Visit (INDEPENDENT_AMBULATORY_CARE_PROVIDER_SITE_OTHER): Payer: Self-pay | Admitting: Family

## 2024-02-18 ENCOUNTER — Encounter: Payer: Self-pay | Admitting: Family

## 2024-02-18 VITALS — BP 130/80 | HR 51 | Temp 97.2°F | Ht 73.0 in | Wt 153.0 lb

## 2024-02-18 DIAGNOSIS — R0981 Nasal congestion: Secondary | ICD-10-CM | POA: Diagnosis not present

## 2024-02-18 DIAGNOSIS — M549 Dorsalgia, unspecified: Secondary | ICD-10-CM | POA: Diagnosis not present

## 2024-02-18 DIAGNOSIS — R634 Abnormal weight loss: Secondary | ICD-10-CM

## 2024-02-18 MED ORDER — METHOCARBAMOL 500 MG PO TABS
500.0000 mg | ORAL_TABLET | Freq: Three times a day (TID) | ORAL | 0 refills | Status: DC | PRN
Start: 2024-02-18 — End: 2024-04-07

## 2024-02-18 MED ORDER — IBUPROFEN 600 MG PO TABS: 600.0000 mg | ORAL_TABLET | Freq: Three times a day (TID) | ORAL | 0 refills | Status: DC | PRN

## 2024-02-18 MED ORDER — LORATADINE 10 MG PO TABS: 10.0000 mg | ORAL_TABLET | Freq: Every day | ORAL | 11 refills | Status: AC
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
## 2024-03-03 ENCOUNTER — Ambulatory Visit (INDEPENDENT_AMBULATORY_CARE_PROVIDER_SITE_OTHER): Admitting: Family

## 2024-03-03 ENCOUNTER — Encounter: Payer: Self-pay | Admitting: Family

## 2024-03-03 VITALS — BP 138/68 | HR 62 | Temp 99.1°F | Ht 73.0 in | Wt 154.8 lb

## 2024-03-03 DIAGNOSIS — N281 Cyst of kidney, acquired: Secondary | ICD-10-CM | POA: Diagnosis not present

## 2024-03-03 DIAGNOSIS — K5901 Slow transit constipation: Secondary | ICD-10-CM

## 2024-03-03 DIAGNOSIS — E162 Hypoglycemia, unspecified: Secondary | ICD-10-CM | POA: Diagnosis not present

## 2024-03-03 DIAGNOSIS — R109 Unspecified abdominal pain: Secondary | ICD-10-CM | POA: Diagnosis not present

## 2024-03-03 LAB — GLUCOSE, POCT (MANUAL RESULT ENTRY): POC Glucose: 119 mg/dL — AB (ref 70–99)

## 2024-03-03 MED ORDER — DOCUSATE SODIUM 100 MG PO CAPS: 100.0000 mg | ORAL_CAPSULE | Freq: Two times a day (BID) | ORAL | 0 refills | Status: AC

## 2024-03-03 MED ORDER — CENTRUM FRESH/FRUITY ADULT PO CHEW: 1.0000 | CHEWABLE_TABLET | Freq: Every day | ORAL | 1 refills | Status: AC

## 2024-03-03 NOTE — Patient Instructions (Addendum)
-   Please get CT abdominal /Flank at [facility] imaging at [address] [facility] then will call you with results.  - Recommend Healthy Snack in between meals and at bedtime.
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
## 2024-03-27 ENCOUNTER — Encounter: Payer: Self-pay | Admitting: Nurse Practitioner

## 2024-03-27 ENCOUNTER — Ambulatory Visit (INDEPENDENT_AMBULATORY_CARE_PROVIDER_SITE_OTHER): Payer: Medicare HMO | Admitting: Nurse Practitioner

## 2024-03-27 VITALS — BP 122/80 | HR 56 | Temp 96.6°F | Resp 16 | Ht 73.0 in | Wt 149.0 lb

## 2024-03-27 DIAGNOSIS — R739 Hyperglycemia, unspecified: Secondary | ICD-10-CM

## 2024-03-27 DIAGNOSIS — E785 Hyperlipidemia, unspecified: Secondary | ICD-10-CM | POA: Diagnosis not present

## 2024-03-27 DIAGNOSIS — N138 Other obstructive and reflux uropathy: Secondary | ICD-10-CM | POA: Diagnosis not present

## 2024-03-27 DIAGNOSIS — R413 Other amnesia: Secondary | ICD-10-CM

## 2024-03-27 DIAGNOSIS — F1011 Alcohol abuse, in remission: Secondary | ICD-10-CM

## 2024-03-27 DIAGNOSIS — N401 Enlarged prostate with lower urinary tract symptoms: Secondary | ICD-10-CM | POA: Diagnosis not present

## 2024-03-27 DIAGNOSIS — I5022 Chronic systolic (congestive) heart failure: Secondary | ICD-10-CM

## 2024-03-27 DIAGNOSIS — H9190 Unspecified hearing loss, unspecified ear: Secondary | ICD-10-CM

## 2024-03-27 DIAGNOSIS — K5901 Slow transit constipation: Secondary | ICD-10-CM

## 2024-03-27 DIAGNOSIS — I10 Essential (primary) hypertension: Secondary | ICD-10-CM
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
## 2024-03-28 LAB — FOLATE: Folate: 11.7 ng/mL

## 2024-03-28 LAB — HEMOGLOBIN A1C
Hgb A1c MFr Bld: 5.8 % — ABNORMAL HIGH (ref <5.7)
Mean Plasma Glucose: 120 mg/dL
eAG (mmol/L): 6.6 mmol/L

## 2024-03-28 LAB — LIPID PANEL
Cholesterol: 160 mg/dL (ref <200)
HDL: 74 mg/dL (ref >=40)
LDL Cholesterol (Calc): 78 mg/dL
Non-HDL Cholesterol (Calc): 86 mg/dL (ref <130)
Total CHOL/HDL Ratio: 2.2 (calc) (ref <5.0)
Triglycerides: 27 mg/dL (ref <150)

## 2024-03-28 LAB — VITAMIN B12: Vitamin B-12: 765 pg/mL (ref 200–1100)

## 2024-03-28 LAB — RPR: RPR Ser Ql: NONREACTIVE

## 2024-03-28 LAB — TSH: TSH: 1.29 m[IU]/L (ref 0.40–4.50)

## 2024-03-30 ENCOUNTER — Ambulatory Visit: Payer: Self-pay | Admitting: Nurse Practitioner

## 2024-03-31 ENCOUNTER — Other Ambulatory Visit: Payer: Self-pay

## 2024-04-01 ENCOUNTER — Ambulatory Visit: Payer: Self-pay | Admitting: Adult Health

## 2024-04-01 ENCOUNTER — Ambulatory Visit
Admission: RE | Admit: 2024-04-01 | Discharge: 2024-04-01 | Disposition: A | Discharge status: Home / Self Care | Payer: Self-pay | Source: Ambulatory Visit | Attending: Family | Admitting: Family

## 2024-04-01 ENCOUNTER — Ambulatory Visit
Admission: RE | Admit: 2024-04-01 | Discharge: 2024-04-01 | Disposition: A | Discharge status: Home / Self Care | Source: Ambulatory Visit | Attending: Nurse Practitioner | Admitting: Nurse Practitioner

## 2024-04-01 DIAGNOSIS — R413 Other amnesia: Secondary | ICD-10-CM | POA: Insufficient documentation

## 2024-04-01 DIAGNOSIS — N281 Cyst of kidney, acquired: Secondary | ICD-10-CM

## 2024-04-01 DIAGNOSIS — E785 Hyperlipidemia, unspecified: Secondary | ICD-10-CM | POA: Insufficient documentation

## 2024-04-01 DIAGNOSIS — K5901 Slow transit constipation: Secondary | ICD-10-CM | POA: Insufficient documentation

## 2024-04-01 DIAGNOSIS — F1011 Alcohol abuse, in remission: Secondary | ICD-10-CM | POA: Insufficient documentation

## 2024-04-01 DIAGNOSIS — R109 Unspecified abdominal pain: Secondary | ICD-10-CM

## 2024-04-01 DIAGNOSIS — R739 Hyperglycemia, unspecified: Secondary | ICD-10-CM | POA: Insufficient documentation

## 2024-04-01 MED ORDER — IOPAMIDOL (ISOVUE-300) INJECTION 61%
100.0000 mL | Freq: Once | INTRAVENOUS | Status: AC | PRN
  Administered 2024-04-01: 100 mL via INTRAVENOUS

## 2024-04-01 NOTE — Assessment & Plan Note (Signed)
 Continue on crestor , follow lipids yearly

## 2024-04-01 NOTE — Assessment & Plan Note (Signed)
 Noting worsening memory loss, hx of ETOH abuse but no current use.  Will get CT and labs to evaluate.

## 2024-04-01 NOTE — Assessment & Plan Note (Signed)
 Continue cessation

## 2024-04-01 NOTE — Assessment & Plan Note (Signed)
 Euvolemic, continues on coreg 

## 2024-04-01 NOTE — Assessment & Plan Note (Signed)
 Controlled on current regimen.

## 2024-04-01 NOTE — Assessment & Plan Note (Signed)
 Previously followed by urology but no recent follow up, will send new referral at this time.

## 2024-04-01 NOTE — Assessment & Plan Note (Signed)
 Not on any medication Recently experienced hypoglycemia with blood glucose at 30 mg/dL, mimicking stroke symptoms. No diabetes history, cause unclear. Resolved with IV glucose. - Ensure regular meals.

## 2024-04-01 NOTE — Assessment & Plan Note (Signed)
 Blood pressure well controlled, goal bp <140/90 Continue current medications and dietary modifications follow metabolic panel

## 2024-04-01 NOTE — Progress Notes (Signed)
-   Has multiple liver cysts which are stable -   Has multiple simple cortical renal cysts, no imaging follow-up required -  can discuss on next clinic visit, will need to make appointment if wanting a referral to gastroenterology

## 2024-04-02 NOTE — Telephone Encounter (Signed)
 Copied from CRM [id number]. Topic: Clinical - Lab/Test Results >> Apr 02, 2024 11:42 AM Adrianna P wrote: Reason for CRM: Missed call regarding labs. Please call patient back [phone] >> Apr 02, 2024 12:21 PM Susanna ORN wrote: Patient calling again to speak with nurse or doctor about labs. Please give a call back.

## 2024-04-02 NOTE — Telephone Encounter (Signed)
Called patient and no answer. Voicemail was left with office call back number.   

## 2024-04-02 NOTE — Telephone Encounter (Signed)
 Patient friend Avelina who is POC called the office back and imaging results discussed and understood. Appointment made with Jereld Delude, NP on Tuesday 04/07/2024 due to PCP Caro Harlene POUR, NP having no earlier availability. Message routed to Jereld Delude, NP as RICK.

## 2024-04-07 ENCOUNTER — Ambulatory Visit: Admitting: Adult Health

## 2024-04-07 ENCOUNTER — Encounter: Payer: Self-pay | Admitting: Adult Health

## 2024-04-07 VITALS — BP 135/78 | HR 55 | Temp 97.3°F | Resp 18 | Ht 73.0 in | Wt 150.6 lb

## 2024-04-07 DIAGNOSIS — G8929 Other chronic pain: Secondary | ICD-10-CM

## 2024-04-07 DIAGNOSIS — M545 Low back pain, unspecified: Secondary | ICD-10-CM

## 2024-04-07 DIAGNOSIS — R413 Other amnesia: Secondary | ICD-10-CM

## 2024-04-07 DIAGNOSIS — I10 Essential (primary) hypertension: Secondary | ICD-10-CM

## 2024-04-07 DIAGNOSIS — K7689 Other specified diseases of liver: Secondary | ICD-10-CM

## 2024-04-07 LAB — HEPATIC FUNCTION PANEL
AG Ratio: 2 (calc) (ref 1.0–2.5)
ALT: 13 U/L (ref 9–46)
AST: 17 U/L (ref 10–35)
Albumin: 4.5 g/dL (ref 3.6–5.1)
Alkaline phosphatase (APISO): 39 U/L (ref 35–144)
Bilirubin, Direct: 0.1 mg/dL (ref 0.0–0.2)
Globulin: 2.2 g/dL (ref 1.9–3.7)
Indirect Bilirubin: 0.5 mg/dL (ref 0.2–1.2)
Total Bilirubin: 0.6 mg/dL (ref 0.2–1.2)
Total Protein: 6.7 g/dL (ref 6.1–8.1)

## 2024-04-07 MED ORDER — IBUPROFEN 600 MG PO TABS: 600.0000 mg | ORAL_TABLET | Freq: Three times a day (TID) | ORAL | 0 refills | Status: AC | PRN

## 2024-04-07 MED ORDER — METHOCARBAMOL 500 MG PO TABS: 500.0000 mg | ORAL_TABLET | Freq: Three times a day (TID) | ORAL | 0 refills | Status: AC | PRN
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
## 2024-04-13 ENCOUNTER — Ambulatory Visit: Payer: Self-pay | Admitting: Adult Health

## 2024-04-13 NOTE — Progress Notes (Signed)
Liver enzymes normal

## 2024-04-14 ENCOUNTER — Ambulatory Visit: Attending: Nurse Practitioner | Admitting: Audiologist

## 2024-04-15 NOTE — Progress Notes (Signed)
 May use the Robaxin  PRN and Ibuprofen  PRN for pain control.

## 2024-04-17 ENCOUNTER — Telehealth: Payer: Self-pay | Admitting: Nurse Practitioner

## 2024-04-17 NOTE — Telephone Encounter (Signed)
 Copied from CRM [id number]. Topic: Clinical - Lab/Test Results >> Apr 17, 2024  3:26 PM Cherylann RAMAN wrote: Reason for CRM: [name], called requesting lab results from renal exam completed on 07/02. Please contact Ronal Adams at [phone] with lab results

## 2024-04-20 NOTE — Telephone Encounter (Signed)
 Ronal Louder Notified  and agreed.

## 2024-04-21 ENCOUNTER — Ambulatory Visit (INDEPENDENT_AMBULATORY_CARE_PROVIDER_SITE_OTHER): Payer: Medicare HMO | Admitting: Family

## 2024-04-21 ENCOUNTER — Encounter: Payer: Self-pay | Admitting: Family

## 2024-04-21 VITALS — BP 128/80 | HR 60 | Temp 97.8°F | Resp 18 | Ht 73.0 in | Wt 154.8 lb

## 2024-04-21 DIAGNOSIS — Z0000 Encounter for general adult medical examination without abnormal findings: Secondary | ICD-10-CM | POA: Diagnosis not present
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
## 2024-04-21 NOTE — Patient Instructions (Signed)
 [name] , Thank you for taking time to come for your Medicare Wellness Visit. I appreciate your ongoing commitment to your health goals. Please review the following plan we discussed and let me know if I can assist you in the future.   Screening recommendations/referrals: Colonoscopy : Up to date  Recommended yearly ophthalmology/optometry visit for glaucoma screening and checkup Recommended yearly dental visit for hygiene and checkup  Vaccinations: Influenza vaccine due annually in September/October Pneumococcal vaccine : Up to date  Tdap vaccine : Up to date  Shingles vaccine : Up to date     Advanced directives: No   Conditions/risks identified: advanced age (>27men, >75 women);male gender;hypertension;smoking/ tobacco exposure  Next appointment: 1 Year   Preventive Care 80 Years and Older, Male Preventive care refers to lifestyle choices and visits with your health care provider that can promote health and wellness. What does preventive care include? A yearly physical exam. This is also called an annual well check. Dental exams once or twice a year. Routine eye exams. Ask your health care provider how often you should have your eyes checked. Personal lifestyle choices, including: Daily care of your teeth and gums. Regular physical activity. Eating a healthy diet. Avoiding tobacco and drug use. Limiting alcohol use. Practicing safe sex. Taking low doses of aspirin  every day. Taking vitamin and mineral supplements as recommended by your health care provider. What happens during an annual well check? The services and screenings done by your health care provider during your annual well check will depend on your age, overall health, lifestyle risk factors, and family history of disease. Counseling  Your health care provider may ask you questions about your: Alcohol use. Tobacco use. Drug use. Emotional well-being. Home and relationship well-being. Sexual  activity. Eating habits. History of falls. Memory and ability to understand (cognition). Work and work Astronomer. Screening  You may have the following tests or measurements: Height, weight, and BMI. Blood pressure. Lipid and cholesterol levels. These may be checked every 5 years, or more frequently if you are over 35 years old. Skin check. Lung cancer screening. You may have this screening every year starting at age 12 if you have a 30-pack-year history of smoking and currently smoke or have quit within the past 15 years. Fecal occult blood test (FOBT) of the stool. You may have this test every year starting at age 15. Flexible sigmoidoscopy or colonoscopy. You may have a sigmoidoscopy every 5 years or a colonoscopy every 10 years starting at age 64. Prostate cancer screening. Recommendations will vary depending on your family history and other risks. Hepatitis C blood test. Hepatitis B blood test. Sexually transmitted disease (STD) testing. Diabetes screening. This is done by checking your blood sugar (glucose) after you have not eaten for a while (fasting). You may have this done every 1-3 years. Abdominal aortic aneurysm (AAA) screening. You may need this if you are a current or former smoker. Osteoporosis. You may be screened starting at age 97 if you are at high risk. Talk with your health care provider about your test results, treatment options, and if necessary, the need for more tests. Vaccines  Your health care provider may recommend certain vaccines, such as: Influenza vaccine. This is recommended every year. Tetanus, diphtheria, and acellular pertussis (Tdap, Td) vaccine. You may need a Td booster every 10 years. Zoster vaccine. You may need this after age 5. Pneumococcal 13-valent conjugate (PCV13) vaccine. One dose is recommended after age 49. Pneumococcal polysaccharide (PPSV23) vaccine. One dose  is recommended after age 78. Talk to your health care provider about which  screenings and vaccines you need and how often you need them. This information is not intended to replace advice given to you by your health care provider. Make sure you discuss any questions you have with your health care provider. Document Released: 10/14/2015 Document Revised: 06/06/2016 Document Reviewed: 07/19/2015 Elsevier Interactive Patient Education  2017 [employer].  Fall Prevention in the Home Falls can cause injuries. They can happen to people of all ages. There are many things you can do to make your home safe and to help prevent falls. What can I do on the outside of my home? Regularly fix the edges of walkways and driveways and fix any cracks. Remove anything that might make you trip as you walk through a door, such as a raised step or threshold. Trim any bushes or trees on the path to your home. Use bright outdoor lighting. Clear any walking paths of anything that might make someone trip, such as rocks or tools. Regularly check to see if handrails are loose or broken. Make sure that both sides of any steps have handrails. Any raised decks and porches should have guardrails on the edges. Have any leaves, snow, or ice cleared regularly. Use sand or salt on walking paths during winter. Clean up any spills in your garage right away. This includes oil or grease spills. What can I do in the bathroom? Use night lights. Install grab bars by the toilet and in the tub and shower. Do not use towel bars as grab bars. Use non-skid mats or decals in the tub or shower. If you need to sit down in the shower, use a plastic, non-slip stool. Keep the floor dry. Clean up any water  that spills on the floor as soon as it happens. Remove soap buildup in the tub or shower regularly. Attach bath mats securely with double-sided non-slip rug tape. Do not have throw rugs and other things on the floor that can make you trip. What can I do in the bedroom? Use night lights. Make sure that you have a  light by your bed that is easy to reach. Do not use any sheets or blankets that are too big for your bed. They should not hang down onto the floor. Have a firm chair that has side arms. You can use this for support while you get dressed. Do not have throw rugs and other things on the floor that can make you trip. What can I do in the kitchen? Clean up any spills right away. Avoid walking on wet floors. Keep items that you use a lot in easy-to-reach places. If you need to reach something above you, use a strong step stool that has a grab bar. Keep electrical cords out of the way. Do not use floor polish or wax that makes floors slippery. If you must use wax, use non-skid floor wax. Do not have throw rugs and other things on the floor that can make you trip. What can I do with my stairs? Do not leave any items on the stairs. Make sure that there are handrails on both sides of the stairs and use them. Fix handrails that are broken or loose. Make sure that handrails are as long as the stairways. Check any carpeting to make sure that it is firmly attached to the stairs. Fix any carpet that is loose or worn. Avoid having throw rugs at the top or bottom of the stairs.  If you do have throw rugs, attach them to the floor with carpet tape. Make sure that you have a light switch at the top of the stairs and the bottom of the stairs. If you do not have them, ask someone to add them for you. What else can I do to help prevent falls? Wear shoes that: Do not have high heels. Have rubber bottoms. Are comfortable and fit you well. Are closed at the toe. Do not wear sandals. If you use a stepladder: Make sure that it is fully opened. Do not climb a closed stepladder. Make sure that both sides of the stepladder are locked into place. Ask someone to hold it for you, if possible. Clearly mark and make sure that you can see: Any grab bars or handrails. First and last steps. Where the edge of each step  is. Use tools that help you move around (mobility aids) if they are needed. These include: Canes. Walkers. Scooters. Crutches. Turn on the lights when you go into a dark area. Replace any light bulbs as soon as they burn out. Set up your furniture so you have a clear path. Avoid moving your furniture around. If any of your floors are uneven, fix them. If there are any pets around you, be aware of where they are. Review your medicines with your doctor. Some medicines can make you feel dizzy. This can increase your chance of falling. Ask your doctor what other things that you can do to help prevent falls. This information is not intended to replace advice given to you by your health care provider. Make sure you discuss any questions you have with your health care provider. Document Released: 07/14/2009 Document Revised: 02/23/2016 Document Reviewed: 10/22/2014 Elsevier Interactive Patient Education  2017 [employer].

## 2024-05-07 NOTE — Telephone Encounter (Signed)
 Spoke with patients aunt Ronal and shared Caro Harlene POUR, NP response. I provided Ronal with the number to [facility] and she has the contact information for [facility]

## 2024-05-07 NOTE — Telephone Encounter (Signed)
 Copied from CRM [id number]. Topic: Referral - Question >> May 07, 2024 12:36 PM Mercer PEDLAR wrote: Reason for CRM: Ronal Louder, patient's aunt would like a callback to clarify if patient needs to schedule an appointment with [facility] or [facility]. She stated that initially it was [facility] and they could not make the appointment due to outstanding balance which has been paid now and she was going to call them to make that appointment but received a call from [facility] to schedule and now is unsure which one he needs to be seen with.   Callback: [phone]

## 2024-05-07 NOTE — Telephone Encounter (Signed)
 I do not see a referral to nephrology- however we did place a referral to NEUROLOGY due to memory loss Also due to his prostate he needs to see urology

## 2024-05-08 ENCOUNTER — Other Ambulatory Visit: Payer: Self-pay | Admitting: Nurse Practitioner

## 2024-05-08 DIAGNOSIS — E785 Hyperlipidemia, unspecified: Secondary | ICD-10-CM

## 2024-05-29 ENCOUNTER — Telehealth: Payer: Self-pay

## 2024-05-29 NOTE — Telephone Encounter (Signed)
 Copied from CRM #[id number]. Topic: Referral - Question >> May 28, 2024  3:11 PM Carrielelia G wrote:  patient has an appointment with [facility] 06/04/24 but a call from [facility] came in asking for a return call.  Should we call that facility back.  Please advise.

## 2024-05-29 NOTE — Telephone Encounter (Signed)
 Return call to whoever placed call to you.

## 2024-08-10 ENCOUNTER — Other Ambulatory Visit: Payer: Self-pay | Admitting: Internal Medicine

## 2024-08-19 ENCOUNTER — Other Ambulatory Visit: Payer: Self-pay | Admitting: Nurse Practitioner

## 2024-08-29 ENCOUNTER — Other Ambulatory Visit: Payer: Self-pay | Admitting: Internal Medicine

## 2024-08-31 ENCOUNTER — Other Ambulatory Visit: Payer: Self-pay | Admitting: Internal Medicine

## 2024-09-01 NOTE — Telephone Encounter (Signed)
**  Pt must schedule an overdue followup appt with Cardiology for any more refills. [phone] 2nd attempt Thank You**

## 2024-09-04 MED ORDER — LOSARTAN POTASSIUM 25 MG PO TABS: 25.0000 mg | ORAL_TABLET | Freq: Every day | ORAL | 0 refills | Status: AC

## 2024-10-02 ENCOUNTER — Ambulatory Visit: Payer: Self-pay | Admitting: Nurse Practitioner

## 2024-10-02 ENCOUNTER — Encounter: Payer: Self-pay | Admitting: Nurse Practitioner

## 2024-10-02 VITALS — BP 132/68 | HR 63 | Temp 97.7°F | Ht 73.0 in | Wt 165.2 lb

## 2024-10-02 DIAGNOSIS — I10 Essential (primary) hypertension: Secondary | ICD-10-CM

## 2024-10-02 DIAGNOSIS — N401 Enlarged prostate with lower urinary tract symptoms: Secondary | ICD-10-CM

## 2024-10-02 DIAGNOSIS — I5022 Chronic systolic (congestive) heart failure: Secondary | ICD-10-CM

## 2024-10-02 DIAGNOSIS — Z23 Encounter for immunization: Secondary | ICD-10-CM

## 2024-10-02 DIAGNOSIS — E785 Hyperlipidemia, unspecified: Secondary | ICD-10-CM | POA: Diagnosis not present

## 2024-10-02 DIAGNOSIS — N138 Other obstructive and reflux uropathy: Secondary | ICD-10-CM

## 2024-10-02 DIAGNOSIS — R0981 Nasal congestion: Secondary | ICD-10-CM

## 2024-10-02 DIAGNOSIS — R739 Hyperglycemia, unspecified: Secondary | ICD-10-CM

## 2024-10-02 DIAGNOSIS — K5901 Slow transit constipation: Secondary | ICD-10-CM

## 2024-10-02 DIAGNOSIS — G8929 Other chronic pain: Secondary | ICD-10-CM | POA: Diagnosis not present

## 2024-10-02 DIAGNOSIS — M545 Low back pain, unspecified: Secondary | ICD-10-CM

## 2024-10-02 DIAGNOSIS — M25512 Pain in left shoulder: Secondary | ICD-10-CM

## 2024-10-02 MED ORDER — TAMSULOSIN HCL 0.4 MG PO CAPS: 0.4000 mg | ORAL_CAPSULE | Freq: Every day | ORAL | 3 refills | Status: AC

## 2024-10-02 NOTE — Patient Instructions (Addendum)
 Physical therapy ordered for shoulder and low back  To ice shoulder 3 times daily  If pain persist after therapy to notify and will refer to specialist for ongoing evaluation  Netipot or saline wash daily Plain nasal saline spray throughout the day as needed Avoid forcefully blowing nose To take Loratadine  or cetrizine (generic for Claritin  or zyrtec) 10 mg by mouth daily for allergies.   To add mirlax 17 gm daily if you are constipated  For for urination- start flomax 0.4 mg daily at beddime
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
## 2024-10-03 LAB — CBC WITH DIFFERENTIAL/PLATELET
Absolute Lymphocytes: 1070 {cells}/uL (ref 850–3900)
Absolute Monocytes: 258 {cells}/uL (ref 200–950)
Basophils Absolute: 20 {cells}/uL (ref 0–200)
Basophils Relative: 0.7 %
Eosinophils Absolute: 258 {cells}/uL (ref 15–500)
Eosinophils Relative: 8.9 %
HCT: 40.4 % (ref 39.4–51.1)
Hemoglobin: 13.4 g/dL (ref 13.2–17.1)
MCH: 33.7 pg — ABNORMAL HIGH (ref 27.0–33.0)
MCHC: 33.2 g/dL (ref 31.6–35.4)
MCV: 101.5 fL (ref 81.4–101.7)
MPV: 10.8 fL (ref 7.5–12.5)
Monocytes Relative: 8.9 %
Neutro Abs: 1293 {cells}/uL — ABNORMAL LOW (ref 1500–7800)
Neutrophils Relative %: 44.6 %
Platelets: 212 Thousand/uL (ref 140–400)
RBC: 3.98 Million/uL — ABNORMAL LOW (ref 4.20–5.80)
RDW: 11.9 % (ref 11.0–15.0)
Total Lymphocyte: 36.9 %
WBC: 2.9 Thousand/uL — ABNORMAL LOW (ref 3.8–10.8)

## 2024-10-03 LAB — LIPID PANEL
Cholesterol: 177 mg/dL
HDL: 85 mg/dL
LDL Cholesterol (Calc): 79 mg/dL
Non-HDL Cholesterol (Calc): 92 mg/dL
Total CHOL/HDL Ratio: 2.1 (calc)
Triglycerides: 51 mg/dL

## 2024-10-03 LAB — HEMOGLOBIN A1C
Hgb A1c MFr Bld: 5.7 % — ABNORMAL HIGH
Mean Plasma Glucose: 117 mg/dL
eAG (mmol/L): 6.5 mmol/L

## 2024-10-03 LAB — COMPREHENSIVE METABOLIC PANEL WITH GFR
AG Ratio: 1.6 (calc) (ref 1.0–2.5)
ALT: 14 U/L (ref 9–46)
AST: 18 U/L (ref 10–35)
Albumin: 4.2 g/dL (ref 3.6–5.1)
Alkaline phosphatase (APISO): 43 U/L (ref 35–144)
BUN: 15 mg/dL (ref 7–25)
CO2: 30 mmol/L (ref 20–32)
Calcium: 9.3 mg/dL (ref 8.6–10.3)
Chloride: 104 mmol/L (ref 98–110)
Creat: 0.7 mg/dL (ref 0.70–1.28)
Globulin: 2.6 g/dL (ref 1.9–3.7)
Glucose, Bld: 93 mg/dL (ref 65–139)
Potassium: 4.7 mmol/L (ref 3.5–5.3)
Sodium: 139 mmol/L (ref 135–146)
Total Bilirubin: 0.6 mg/dL (ref 0.2–1.2)
Total Protein: 6.8 g/dL (ref 6.1–8.1)
eGFR: 98 mL/min/1.73m2

## 2024-10-03 LAB — PSA: PSA: 1.21 ng/mL

## 2024-10-05 ENCOUNTER — Ambulatory Visit: Payer: Self-pay | Admitting: Nurse Practitioner

## 2024-10-09 DIAGNOSIS — F1011 Alcohol abuse, in remission: Secondary | ICD-10-CM | POA: Diagnosis not present

## 2024-10-09 DIAGNOSIS — I700 Atherosclerosis of aorta: Secondary | ICD-10-CM | POA: Diagnosis not present

## 2024-10-09 DIAGNOSIS — M545 Low back pain, unspecified: Secondary | ICD-10-CM | POA: Diagnosis not present

## 2024-10-09 DIAGNOSIS — E785 Hyperlipidemia, unspecified: Secondary | ICD-10-CM | POA: Diagnosis not present

## 2024-10-09 DIAGNOSIS — G8929 Other chronic pain: Secondary | ICD-10-CM | POA: Diagnosis not present

## 2024-10-09 DIAGNOSIS — R739 Hyperglycemia, unspecified: Secondary | ICD-10-CM | POA: Diagnosis not present

## 2024-10-09 DIAGNOSIS — M25512 Pain in left shoulder: Secondary | ICD-10-CM | POA: Diagnosis not present

## 2024-10-09 DIAGNOSIS — I5022 Chronic systolic (congestive) heart failure: Secondary | ICD-10-CM | POA: Diagnosis not present

## 2024-10-09 DIAGNOSIS — K5901 Slow transit constipation: Secondary | ICD-10-CM | POA: Diagnosis not present

## 2024-10-09 DIAGNOSIS — I110 Hypertensive heart disease with heart failure: Secondary | ICD-10-CM | POA: Diagnosis not present

## 2024-10-09 DIAGNOSIS — N401 Enlarged prostate with lower urinary tract symptoms: Secondary | ICD-10-CM | POA: Diagnosis not present

## 2024-10-16 ENCOUNTER — Telehealth: Payer: Self-pay | Admitting: *Deleted

## 2024-10-16 NOTE — Progress Notes (Signed)
 Complex Care Management Note  Care Guide Note 10/16/2024 Name: [name] MRN: [id number] DOB: Mar 06, 1951  LARRIE FRAIZER is a 74 y.o. year old male who sees Eubanks, Harlene POUR, NP for primary care. I reached out to Jayson GORMAN Mae by phone today to offer complex care management services.  Mr. Errico was given information about Complex Care Management services today including:   The Complex Care Management services include support from the care team which includes your Nurse Care Manager, Clinical Social Worker, or Pharmacist.  The Complex Care Management team is here to help remove barriers to the health concerns and goals most important to you. Complex Care Management services are voluntary, and the patient may decline or stop services at any time by request to their care team member.   Complex Care Management Consent Status: Patient agreed to services and verbal consent obtained.   Follow up plan:  Telephone appointment with complex care management team member scheduled for:  10/23/2024 and 11/02/2024  Encounter Outcome:  Patient Scheduled  Thedford Franks, CMA, AAMA [facility], [facility] Guide, Lead Direct Dial: [phone]  Fax: [phone]

## 2024-10-19 ENCOUNTER — Other Ambulatory Visit: Payer: Self-pay | Admitting: Nurse Practitioner

## 2024-10-19 DIAGNOSIS — G4700 Insomnia, unspecified: Secondary | ICD-10-CM

## 2024-10-23 ENCOUNTER — Other Ambulatory Visit: Payer: Self-pay | Admitting: Licensed Clinical Social Worker

## 2024-10-23 NOTE — Patient Outreach (Signed)
 Social Drivers of Health  Community Resource and Care Coordination Visit Note   10/23/2024  Name: [name] MRN: [id number] DOB:04/15/1951  Situation: Referral received for [facility] needs assessment and assistance related to Transportation. I obtained verbal consent from Patient.  Visit completed with Patient on the phone.   Background:   SDOH Interventions Today    Flowsheet Row Most Recent Value  SDOH Interventions   Food Insecurity Interventions Intervention Not Indicated  Housing Interventions Intervention Not Indicated  Transportation Interventions Community Resources Provided, Other (Comment)  [Gaurdian will call insurance to check transportation benefits]  Utilities Interventions Intervention Not Indicated     Assessment:   Goals Addressed             This Visit's Progress    BSW VBCI Social Work Care Plan       Current SDOH Barriers:  Transportation  Interventions: Patient interviewed and appropriate screenings performed Advised patient to call [employer] to see if transportation is a benefit           Recommendation:   attend all scheduled provider appointments ask for help if you don't understand your health insurance benefits Call [employer] to check on transportation benefits  Follow Up Plan:   Telephone follow up appointment date/time:  11/06/2024 at 1:30 pm  Tobias CHARM Maranda HEDWIG, PhD [facility], [facility] Social Worker Direct Dial: [phone]  Fax: [phone]

## 2024-10-23 NOTE — Patient Instructions (Signed)
 Visit Information  Thank you for taking time to visit with me today. Please don't hesitate to contact me if I can be of assistance to you before our next scheduled appointment.  Our next appointment is by telephone on 11/06/2024 at 1:30 pm Please call the care guide team at [phone] if you need to cancel or reschedule your appointment.   Following is a copy of your care plan:   Goals Addressed             This Visit's Progress    BSW VBCI Social Work Care Plan       Current SDOH Barriers:  Transportation  Interventions: Patient interviewed and appropriate screenings performed Advised patient to call [employer] to see if transportation is a benefit           Please call the Suicide and Crisis Lifeline: 988 go to [facility] [address], [place] [phone]) call 911 if you are experiencing a Mental Health or Behavioral Health Crisis or need someone to talk to.  Patient verbalized understanding of Care plan and visit instructions communicated this visit  Tobias CHARM Maranda HEDWIG, PhD [facility], [facility] Social Worker Direct Dial: [phone]  Fax: [phone]

## 2024-11-02 ENCOUNTER — Other Ambulatory Visit: Payer: Self-pay

## 2024-11-02 NOTE — Patient Outreach (Signed)
 Complex Care Management   Visit Note  11/02/2024  Name:  [name] MRN: [id number] DOB: 04/04/1951  Situation: Referral received for Complex Care Management related to Essential Hypertension, Aortic Atherosclerosis, Constipation, Urinary frequency.  I obtained verbal consent from Caregiver.  Visit completed with Caregiver on the phone.  Background:   Past Medical History:  Diagnosis Date   Alcoholism (HCC) 2007   Bowel obstruction (HCC)    Chronic systolic CHF (congestive heart failure) (HCC) 01/23/2016   LHC 01/23/16 - Normal coronary arteries.; EF 35-45  // ECHO 12/12/15 - EF 20-25, severe diff HK, Gr 1 DD   GERD (gastroesophageal reflux disease)    Hypertension    Mass    back of head    Assessment: Patient Reported Symptoms:  Cognitive Cognitive Status: Struggling with memory recall, Difficulties with attention and concentration, Normal speech and language skills, Able to follow simple commands, Requires Assistance Decision Making Cognitive/Intellectual Conditions Management [RPT]: Intellectual Disability   Health Maintenance Behaviors: Immunizations, Annual physical exam Health Facilitated by: Pain control, Rest  Neurological Neurological Review of Symptoms: Hearing changes Neurological Management Strategies: Routine screening Neurological Self-Management Outcome: 3 (uncertain)  HEENT HEENT Symptoms Reported: Change or loss of hearing HEENT Management Strategies: Routine screening HEENT Self-Management Outcome: 3 (uncertain) HEENT Comment: starting having change in hearing    Cardiovascular Cardiovascular Symptoms Reported: Chest pain or discomfort Does patient have uncontrolled Hypertension?: No Cardiovascular Management Strategies: Routine screening Cardiovascular Self-Management Outcome: 4 (good) Per patient's aunt Ronal. patient has chronic angina, his doctor is aware; educated Ronal re: the importance to schedule a f/u with patient's Cardiology for further  evaluation due to not having had a recent follow up; educated re: basic disease process related to patient's known cardiology problems   Respiratory Respiratory Symptoms Reported: No symptoms reported    Endocrine Endocrine Symptoms Reported: No symptoms reported Is patient diabetic?: Yes Is patient checking blood sugars at home?: No Endocrine Self-Management Outcome: 4 (good)  Gastrointestinal Gastrointestinal Symptoms Reported: Constipation Gastrointestinal Management Strategies: Medication therapy, Diet modification Gastrointestinal Self-Management Outcome: 3 (uncertain)    Genitourinary Genitourinary Symptoms Reported: Frequency Genitourinary Management Strategies: Medication therapy Genitourinary Self-Management Outcome: 3 (uncertain)  Integumentary Integumentary Symptoms Reported: Itching Skin Management Strategies: Routine screening Skin Self-Management Outcome: 3 (uncertain)  Musculoskeletal Musculoskelatal Symptoms Reviewed: Joint pain Additional Musculoskeletal Details: right shoulder pain/hip pain Musculoskeletal Management Strategies: Routine screening Musculoskeletal Self-Management Outcome: 4 (good) Falls in the past year?: Yes (patient fell on ice, no injuries) Number of falls in past year: 1 or less Was there an injury with Fall?: No Fall Risk Category Calculator: 1 Patient Fall Risk Level: Low Fall Risk    Psychosocial Psychosocial Symptoms Reported: No symptoms reported   Major Change/Loss/Stressor/Fears (CP): Denies Quality of Family Relationships: involved, helpful, supportive Do you feel physically threatened by others?:  (unable to assess)    11/02/2024    PHQ2-9 Depression Screening   Oleva Koo interest or pleasure in doing things    Feeling down, depressed, or hopeless    PHQ-2 - Total Score    Trouble falling or staying asleep, or sleeping too much    Feeling tired or having Galileah Piggee energy    Poor appetite or overeating     Feeling bad about yourself - or  that you are a failure or have let yourself or your family down    Trouble concentrating on things, such as reading the newspaper or watching television    Moving or speaking so slowly that other people could have  noticed.  Or the opposite - being so fidgety or restless that you have been moving around a lot more than usual    Thoughts that you would be better off dead, or hurting yourself in some way    PHQ2-9 Total Score    If you checked off any problems, how difficult have these problems made it for you to do your work, take care of things at home, or get along with other people    Depression Interventions/Treatment      There were no vitals filed for this visit.    Medications Reviewed Today     Reviewed by Morgan Clayborne CROME, RN (Registered Nurse) on 11/02/24 at 1327  Med List Status: <None>   Medication Order Taking? Sig Documenting Provider Last Dose Status Informant  acetaminophen  (TYLENOL ) 325 MG tablet [id number] Yes Take 2 tablets (650 mg total) by mouth every 6 (six) hours as needed for moderate pain or mild pain (headache). Vicci Burnard JONELLE DEVONNA  Active   aspirin  EC 81 MG tablet [id number] Yes Take 1 tablet (81 mg total) by mouth daily. Walker, Caitlin S, NP  Active Self  Blood Pressure Monitoring (BLOOD PRESSURE MONITOR/L CUFF) MISC [id number]  1 application by Does not apply route daily. Ngetich, Dinah C, NP  Active   carvedilol  (COREG ) 3.125 MG tablet [id number] Yes TAKE 1 TABLET(3.125 MG) BY MOUTH TWICE DAILY Caro, Jessica K, NP  Active   docusate sodium  (COLACE) 100 MG capsule [id number]  Take 1 capsule (100 mg total) by mouth 2 (two) times daily. Ngetich, Dinah C, NP  Active   ibuprofen  (ADVIL ) 600 MG tablet [id number] Yes Take 1 tablet (600 mg total) by mouth every 8 (eight) hours as needed. Take with food Medina-Vargas, Monina C, NP  Active   loratadine  (CLARITIN ) 10 MG tablet [id number]  Take 1 tablet (10 mg total) by mouth daily. Ngetich, Dinah C, NP  Active   losartan   (COZAAR ) 25 MG tablet [id number]  Take 1 tablet (25 mg total) by mouth daily. Okey Vina GAILS, MD  Active   methocarbamol  (ROBAXIN ) 500 MG tablet [id number]  Take 1 tablet (500 mg total) by mouth every 8 (eight) hours as needed for muscle spasms. Medina-Vargas, Monina C, NP  Active   multivitamin (CENTRUM) chewable tablet [id number]  Chew 1 tablet by mouth daily. Ngetich, Dinah C, NP  Active   rosuvastatin  (CRESTOR ) 5 MG tablet [id number] Yes TAKE 1 TABLET(5 MG) BY MOUTH DAILY Caro, Jessica K, NP  Active   tamsulosin  (FLOMAX ) 0.4 MG CAPS capsule [id number]  Take 1 capsule (0.4 mg total) by mouth daily after supper. Caro Harlene POUR, NP  Active   traZODone  (DESYREL ) 100 MG tablet [id number] Yes TAKE 1 TABLET(100 MG) BY MOUTH AT BEDTIME AS NEEDED FOR SLEEP Caro Jessica K, NP  Active   vitamin B-12 (CYANOCOBALAMIN) 1000 MCG tablet [id number] Yes Take 1,000 mcg by mouth daily. [provider]  Active             Recommendation:   PCP Follow-up Specialty provider follow-up   Follow Up Plan:   Telephone follow up appointment date/time   11/06/2024 Status: Sch   Time: 1:30 PM Length: 30  Visit Type: PATIENT OUTREACH 30 [3016] Copay: $0.00  Provider: Maranda Lister D Department: [facility]   11/27/2024 Status: Sch   Time: 10:30 AM Length: 30  Visit Type: VBCI TELEPHONE CALL 30 [2502] Copay: $0.00  Provider: Morgan Clayborne CROME, RN Department: [facility]   Clayborne Morgan OBIE BETHANN  CCM [place]  [facility], [facility] Health Nurse Care Coordinator  Direct Dial: [phone] Website: [email or web address]

## 2024-11-04 NOTE — Patient Instructions (Addendum)
 Visit Information  Thank you for taking time to visit with me today. Please don't hesitate to contact me if I can be of assistance to you before our next scheduled appointment.  Our next appointment is by telephone on Friday, February 27 at 10:30 AM Please call the care guide team at [phone] if you need to cancel or reschedule your appointment.   Following is a copy of your care plan:   Goals Addressed             This Visit's Progress    VBCI RN Care Plan related to Acute pain of left shoulder; Chronic right-sided low back pain without sciatica       Problems:  Chronic Disease Management support and education needs related to Acute pain of left shoulder;Chronic right-sided low back pain without sciatica  Goal: Over the next 90 days the Patient/Caregiver will continue to work with RN Care Manager and/or Social Worker to address care management and care coordination needs related to Acute pain of left shoulder; Chronic right-sided low back pain without sciatica as evidenced by adherence to care management team scheduled appointments     demonstrate Improved health management independence as evidenced by patient will report having decreased pain to his left shoulder and low back, he will verbalize adherence to his prescribed HEP per PT         Interventions:   Pain Interventions: Pain assessment performed Medications reviewed Reviewed provider established plan for pain management Discussed importance of adherence to all scheduled medical appointments Counseled on the importance of reporting any/all new or changed pain symptoms or management strategies to pain management provider Advised patient to report to care team affect of pain on daily activities Reviewed with patient prescribed pharmacological and nonpharmacological pain relief strategies Discussed aunt Ronal will confirm patient is working with in home PT as directed by his PCP  Patient Self-Care Activities:  Attend all  scheduled provider appointments Call pharmacy for medication refills 3-7 days in advance of running out of medications Call provider office for new concerns or questions  Take medications as prescribed    Recommendation:   PCP Follow-up Specialty provider follow-up    Follow Up Plan:   Telephone follow up appointment date/time   11/06/2024 Status: Sch    Time: 1:30 PM Length: 30  Visit Type: PATIENT OUTREACH 30 [3016] Copay: $0.00  Provider: Maranda Lister D Department: [facility]    11/27/2024 Status: Sch    Time: 10:30 AM Length: 30  Visit Type: VBCI TELEPHONE CALL 30 [2502] Copay: $0.00  Provider: Morgan Clayborne CROME, RN Department: [facility]        VBCI RN Care Plan related to Essential Hypertension, Aortic Atherosclerosis, Chronic Systolic HF,  NICM (nonischemic cardiomyopathy)       Problems:  Chronic Disease Management support and education needs related to Essential Hypertension, Aortic Atherosclerosis, Chronic Systolic HF, NICM (nonischemic cardiomyopathy)  Goal: Over the next 90 days the Caregiver and Patient will continue to work with Medical Illustrator and/or Social Worker to address care management and care coordination needs related to Essential Hypertension, Aortic Atherosclerosis, Chronic Systolic HF, NICM (nonischemic cardiomyopathy) as evidenced by adherence to care management team scheduled appointments     take all medications exactly as prescribed and will call provider for medication related questions as evidenced by caregiver will confirmed patient adherence to taking his medications with no missed doses      Interventions:   CAD/Atherosclerosis Interventions: Assessed understanding of Aortic Atherosclerosis diagnosis Medications  reviewed including medications utilized in CAD treatment plan Provided education on importance of blood pressure control in management of CAD Provided education on Importance of limiting foods high in  cholesterol Reviewed Importance of taking all medications as prescribed Reviewed Importance of attending all scheduled provider appointments  Hypertension Interventions: Last practice recorded BP readings:  BP Readings from Last 3 Encounters:  10/02/24 132/68  04/21/24 128/80  04/07/24 135/78   Most recent eGFR/CrCl:  Lab Results  Component Value Date   EGFR 98 10/02/2024    No components found for: CRCL  Evaluation of current treatment plan related to hypertension self management and patient's adherence to plan as established by provider Reviewed medications with patient and discussed importance of compliance Provided education on prescribed diet low Sodium  Assessed social determinant of health barriers Discussed with aunt Ronal, the importance of scheduling a Cardiology follow up with Dr. Vina Gull MD for evaluation of ongoing c/o angina  Discussed plans with patient for ongoing nurse care management follow up and provided patient with direct contact information for nurse case management   Patient Self-Care Activities:  Attend all scheduled provider appointments Call pharmacy for medication refills 3-7 days in advance of running out of medications Call provider office for new concerns or questions  Take medications as prescribed   call doctor for signs and symptoms of high blood pressure keep all doctor appointments take medications for blood pressure exactly as prescribed report new symptoms to your doctor call for medicine refill 2 or 3 days before it runs out call doctor when you experience any new symptoms go to all doctor appointments as scheduled adhere to prescribed diet: low salt, low fat  Work with the nurse care manager to address care coordination needs and will continue to work with the clinical team to address health care and disease management related needs  Recommendation:   PCP Follow-up Specialty provider follow-up    Follow Up Plan:   Telephone  follow up appointment date/time   11/06/2024 Status: Sch    Time: 1:30 PM Length: 30  Visit Type: PATIENT OUTREACH 30 [3016] Copay: $0.00  Provider: Maranda Lister D Department: [facility]    11/27/2024 Status: Sch    Time: 10:30 AM Length: 30  Visit Type: VBCI TELEPHONE CALL 30 [2502] Copay: $0.00  Provider: Morgan Clayborne CROME, RN Department: [facility]                Please call 1-800-273-TALK (toll free, 24 hour hotline) if you are experiencing a Mental Health or Behavioral Health Crisis or need someone to talk to.  Caregiver/aunt Ronal Louder  verbalized understanding of Care plan and visit instructions communicated this visit  Clayborne Morgan RN BSN CCM [place]  [facility], [facility] Health Nurse Care Coordinator  Direct Dial: [phone] Website: [email or web address]

## 2024-11-06 ENCOUNTER — Other Ambulatory Visit: Payer: Self-pay | Admitting: Licensed Clinical Social Worker

## 2024-11-06 NOTE — Patient Instructions (Signed)
 Visit Information  Thank you for taking time to visit with me today. Please don't hesitate to contact me if I can be of assistance to you before our next scheduled appointment.  Your next care management appointment is by telephone on 11/23/2024 at 11:00 am    Please call the care guide team at [phone] if you need to cancel, schedule, or reschedule an appointment.   Please call the Suicide and Crisis Lifeline: 988 go to [facility] Urgent [facility] [address], [place] [phone]) call 911 if you are experiencing a Mental Health or Behavioral Health Crisis or need someone to talk to.  Tobias CHARM Maranda HEDWIG, PhD [facility], [facility] Social Worker Direct Dial: [phone]  Fax: [phone]

## 2024-11-06 NOTE — Patient Outreach (Signed)
 Social Drivers of Health  Community Resource and Care Coordination Visit Note   11/06/2024  Name: [name] MRN: [id number] DOB:[birth year]  Situation: Referral received for [facility] needs assessment and assistance related to Transportation. I obtained verbal consent from Patient.  Visit completed with Patient on the phone.   Background:      Assessment:   Goals Addressed             This Visit's Progress    BSW VBCI Social Work Care Plan   On track    Current SDOH Barriers:  Transportation  Interventions: Patient interviewed and appropriate screenings performed Advised patient to call [employer] to see if transportation is a benefit Patient stated that he called [employer] and they stated that his transportation is included and his aunt stated that she will call and set up his transportation.           Recommendation:   attend all scheduled provider appointments call for transportation assistance at least one week before appointments  Follow Up Plan:   Telephone follow up appointment date/time:  11/23/2024 at 11:00 am  Tobias CHARM Maranda HEDWIG, PhD [facility], [facility] Social Worker Direct Dial: [phone]  Fax: [phone]

## 2024-11-23 ENCOUNTER — Other Ambulatory Visit: Admitting: Licensed Clinical Social Worker

## 2024-11-27 ENCOUNTER — Telehealth

## 2025-01-01 ENCOUNTER — Ambulatory Visit: Admitting: Nurse Practitioner

## 2025-04-23 ENCOUNTER — Encounter: Payer: Self-pay | Admitting: Nurse Practitioner
